# Patient Record
Sex: Male | Born: 1965 | Race: Black or African American | Hispanic: No | Marital: Single
Health system: Southern US, Community
[De-identification: ages and names within clinical notes are randomized; demographics above are authoritative.]

## PROBLEM LIST (undated history)

## (undated) DIAGNOSIS — Z91013 Allergy to seafood: Secondary | ICD-10-CM

## (undated) DIAGNOSIS — J45909 Unspecified asthma, uncomplicated: Secondary | ICD-10-CM

---

## 2003-02-18 ENCOUNTER — Emergency Department (HOSPITAL_COMMUNITY): Admission: EM | Admit: 2003-02-18 | Discharge: 2003-02-18 | Payer: Self-pay | Admitting: Emergency Medicine

## 2003-02-18 ENCOUNTER — Encounter: Payer: Self-pay | Admitting: Emergency Medicine

## 2004-04-12 ENCOUNTER — Emergency Department (HOSPITAL_COMMUNITY): Admission: EM | Admit: 2004-04-12 | Discharge: 2004-04-12 | Payer: Self-pay | Admitting: Emergency Medicine

## 2005-01-23 ENCOUNTER — Emergency Department (HOSPITAL_COMMUNITY): Admission: EM | Admit: 2005-01-23 | Discharge: 2005-01-23 | Payer: Self-pay | Admitting: Emergency Medicine

## 2011-03-07 ENCOUNTER — Emergency Department (HOSPITAL_COMMUNITY)
Admission: EM | Admit: 2011-03-07 | Discharge: 2011-03-07 | Disposition: A | Discharge status: Home / Self Care | Payer: Self-pay | Attending: Emergency Medicine | Admitting: Emergency Medicine

## 2011-03-07 ENCOUNTER — Emergency Department (HOSPITAL_COMMUNITY): Payer: Self-pay

## 2011-03-07 DIAGNOSIS — R05 Cough: Secondary | ICD-10-CM | POA: Insufficient documentation

## 2011-03-07 DIAGNOSIS — IMO0001 Reserved for inherently not codable concepts without codable children: Secondary | ICD-10-CM | POA: Insufficient documentation

## 2011-03-07 DIAGNOSIS — R059 Cough, unspecified: Secondary | ICD-10-CM | POA: Insufficient documentation

## 2011-03-07 DIAGNOSIS — R509 Fever, unspecified: Secondary | ICD-10-CM | POA: Insufficient documentation

## 2011-03-07 DIAGNOSIS — J45909 Unspecified asthma, uncomplicated: Secondary | ICD-10-CM | POA: Insufficient documentation

## 2013-03-27 ENCOUNTER — Encounter (HOSPITAL_COMMUNITY): Payer: Self-pay

## 2013-03-27 ENCOUNTER — Emergency Department (HOSPITAL_COMMUNITY)
Admission: EM | Admit: 2013-03-27 | Discharge: 2013-03-27 | Disposition: A | Discharge status: Home / Self Care | Payer: Self-pay | Attending: Emergency Medicine | Admitting: Emergency Medicine

## 2013-03-27 DIAGNOSIS — Z79899 Other long term (current) drug therapy: Secondary | ICD-10-CM | POA: Insufficient documentation

## 2013-03-27 DIAGNOSIS — F17200 Nicotine dependence, unspecified, uncomplicated: Secondary | ICD-10-CM | POA: Insufficient documentation

## 2013-03-27 DIAGNOSIS — R21 Rash and other nonspecific skin eruption: Secondary | ICD-10-CM | POA: Insufficient documentation

## 2013-03-27 DIAGNOSIS — J45909 Unspecified asthma, uncomplicated: Secondary | ICD-10-CM | POA: Insufficient documentation

## 2013-03-27 HISTORY — DX: Unspecified asthma, uncomplicated: J45.909

## 2013-03-27 MED ORDER — TRIAMCINOLONE ACETONIDE 0.025 % EX OINT: TOPICAL_OINTMENT | Freq: Two times a day (BID) | CUTANEOUS | Status: DC

## 2013-03-27 NOTE — ED Notes (Signed)
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

## 2013-03-27 NOTE — ED Provider Notes (Signed)
CSN: [id number]     Arrival date & time 03/27/13  8469 History   First MD Initiated Contact with Patient 03/27/13 [phone]     Chief Complaint  Patient presents with  . Rash   (Consider location/radiation/quality/duration/timing/severity/associated sxs/prior Treatment) Patient is a 47 y.o. male presenting with rash. The history is provided by the patient. No language interpreter was used.  Rash Location: Neck. 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 Ineffective treatments:  Moisturizers Associated symptoms: no fever, no hoarse voice, no myalgias, no sore throat and no URI     Past Medical History  Diagnosis Date  . Asthma    History reviewed. No pertinent past surgical history. No family history on file. History  Substance Use Topics  . Smoking status: Current Some Day Smoker  . Smokeless tobacco: Not on file  . Alcohol Use: Yes    Review of Systems  Constitutional: Negative for fever.  HENT: Negative for sore throat and hoarse voice.   Musculoskeletal: Negative for myalgias.  Skin: Positive for rash.  All other systems reviewed and are negative.    Allergies  Shrimp  Home Medications   Current Outpatient Rx  Name  Route  Sig  Dispense  Refill  . acetaminophen (TYLENOL) 500 MG tablet   Oral   Take 1,000 mg by mouth every 6 (six) hours as needed for pain.         Marland Kitchen albuterol (PROVENTIL HFA;VENTOLIN HFA) 108 (90 BASE) MCG/ACT inhaler   Inhalation   Inhale 2 puffs into the lungs every 6 (six) hours as needed for wheezing.         Marland Kitchen  DM-Doxylamine-Acetaminophen (NYQUIL COLD & FLU PO)   Oral   Take 30 mLs by mouth at bedtime as needed (cold/flu symptoms).         . triamcinolone (KENALOG) 0.025 % ointment   Topical   Apply topically 2 (two) times daily. Do not apply to face   15 g   1    BP 148/85  Pulse 87  Temp(Src) 98.3 F (36.8 C) (Oral)  Resp 20  SpO2 100%  Physical Exam  Nursing note and vitals reviewed. Constitutional: He is oriented to person, place, and time. He appears well-developed and well-nourished. No distress.  HENT:  Head: Normocephalic and atraumatic.  Eyes: Conjunctivae and EOM are normal. No scleral icterus.  Neck: Normal range of motion. Neck supple.  Cardiovascular: Normal rate, regular rhythm and intact distal pulses.   Distal radial pulses 2+ bilaterally  Pulmonary/Chest: Effort normal. No respiratory distress.  Musculoskeletal: Normal range of motion.  Neurological: He is alert and oriented to person, place, and time.  Skin: Skin is warm and dry. Rash noted. He is not diaphoretic. No erythema. No pallor.  Planar, scaling, non-erythematous rash on anterior and posterior of neck. Skin of affected area leathery to touch and thicker than surrounding skin. No pustules, papules, vesicles, weeping, or drainage. No heat-to-touch.  Psychiatric: He has a normal mood and affect. His behavior is normal.    ED Course  Procedures (including critical care time) Labs Review  Labs Reviewed - No data to display  Imaging Review No results found.  MDM   1. Rash    47 year old male presents for rash to his neck times one week. Patient is well and nontoxic appearing, hemodynamically stable, and afebrile. Physical exam findings as above. Rash not concerning for SJS, erythema multiforme major, or erythema multiforme minor. Patient denies new topical contacts or food ingestion. He denies recent antibiotic use. Patient appropriate for discharge with prescription for triamcinolone for symptoms.  Dermatology referral provided should symptoms not improve. Return precautions discussed and patient agreeable to plan of no unaddressed concerns.    Antonietta Breach, PA-C 03/27/13 0165  Medical screening examination/treatment/procedure(s) were conducted as a shared visit with non-physician practitioner(s) or resident  and myself.  I personally evaluated the patient during the encounter and agree with the findings and plan unless otherwise indicated.    Non specific rash on neck.  Possibly related to plant exposures, pt denies other new exposures.  Dry, pruritic, no erythema or edema, no pustules, no petechia, supple neck. Plan for steroid cream and fup outpt.  Pt agrees with plan, well appearing in ED.  Rash, Dermatitis  Mariea Clonts, MD 03/27/13 1704

## 2013-03-27 NOTE — Progress Notes (Signed)
[facility] provided pt with a list of primary care resources and a [facility] [employer] Card application.  °

## 2015-08-04 ENCOUNTER — Encounter (HOSPITAL_COMMUNITY): Payer: Self-pay

## 2015-08-04 ENCOUNTER — Emergency Department (HOSPITAL_COMMUNITY)
Admission: EM | Admit: 2015-08-04 | Discharge: 2015-08-04 | Disposition: A | Discharge status: Home / Self Care | Payer: BLUE CROSS/BLUE SHIELD | Attending: Emergency Medicine | Admitting: Emergency Medicine

## 2015-08-04 DIAGNOSIS — M5441 Lumbago with sciatica, right side: Secondary | ICD-10-CM | POA: Insufficient documentation

## 2015-08-04 DIAGNOSIS — J45909 Unspecified asthma, uncomplicated: Secondary | ICD-10-CM | POA: Diagnosis not present

## 2015-08-04 DIAGNOSIS — Z79899 Other long term (current) drug therapy: Secondary | ICD-10-CM | POA: Insufficient documentation

## 2015-08-04 DIAGNOSIS — F17210 Nicotine dependence, cigarettes, uncomplicated: Secondary | ICD-10-CM | POA: Insufficient documentation

## 2015-08-04 DIAGNOSIS — M545 Low back pain: Secondary | ICD-10-CM | POA: Diagnosis present

## 2015-08-04 MED ORDER — HYDROCODONE-ACETAMINOPHEN 5-325 MG PO TABS: 1.0000 | ORAL_TABLET | ORAL | Status: DC | PRN

## 2015-08-04 MED ORDER — PREDNISONE 20 MG PO TABS: 20.0000 mg | ORAL_TABLET | Freq: Two times a day (BID) | ORAL | Status: DC

## 2015-08-04 MED ORDER — HYDROCODONE-ACETAMINOPHEN 5-325 MG PO TABS
1.0000 | ORAL_TABLET | ORAL | Status: DC | PRN
Start: 2015-08-04 — End: 2015-08-04

## 2015-08-04 MED ORDER — PREDNISONE 20 MG PO TABS
20.0000 mg | ORAL_TABLET | Freq: Two times a day (BID) | ORAL | Status: DC
Start: 2015-08-04 — End: 2015-08-04

## 2015-08-04 NOTE — ED Notes (Signed)
Patient c/o lower back pain that radiates down the right leg. Patient denies numbness of the right leg, but has intermittent tingling.

## 2015-08-04 NOTE — Discharge Instructions (Signed)
Use heat for sore areas 3 times a day. Try to gently stretch.    Radicular Pain Radicular pain in either the arm or leg is usually from a bulging or herniated disk in the spine. A piece of the herniated disk may press against the nerves as the nerves exit the spine. This causes pain which is felt at the tips of the nerves down the arm or leg. Other causes of radicular pain may include: 1. Fractures. 2. Heart disease. 3. Cancer. 4. An abnormal and usually degenerative state of the nervous system or nerves (neuropathy). Diagnosis may require CT or MRI scanning to determine the primary cause.  Nerves that start at the neck (nerve roots) may cause radicular pain in the outer shoulder and arm. It can spread down to the thumb and fingers. The symptoms vary depending on which nerve root has been affected. In most cases radicular pain improves with conservative treatment. Neck problems may require physical therapy, a neck collar, or cervical traction. Treatment may take many weeks, and surgery may be considered if the symptoms do not improve.  Conservative treatment is also recommended for sciatica. Sciatica causes pain to radiate from the lower back or buttock area down the leg into the foot. Often there is a history of back problems. Most patients with sciatica are better after 2 to 4 weeks of rest and other supportive care. Short term bed rest can reduce the disk pressure considerably. Sitting, however, is not a good position since this increases the pressure on the disk. You should avoid bending, lifting, and all other activities which make the problem worse. Traction can be used in severe cases. Surgery is usually reserved for patients who do not improve within the first months of treatment. Only take over-the-counter or prescription medicines for pain, discomfort, or fever as directed by your caregiver. Narcotics and muscle relaxants may help by relieving more severe pain and spasm and by providing mild  sedation. Cold or massage can give significant relief. Spinal manipulation is not recommended. It can increase the degree of disc protrusion. Epidural steroid injections are often effective treatment for radicular pain. These injections deliver medicine to the spinal nerve in the space between the protective covering of the spinal cord and back bones (vertebrae). Your caregiver can give you more information about steroid injections. These injections are most effective when given within two weeks of the onset of pain.  You should see your caregiver for follow up care as recommended. A program for neck and back injury rehabilitation with stretching and strengthening exercises is an important part of management.  SEEK IMMEDIATE MEDICAL CARE IF: 1. You develop increased pain, weakness, or numbness in your arm or leg. 2. You develop difficulty with bladder or bowel control. 3. You develop abdominal pain.   This information is not intended to replace advice given to you by your health care provider. Make sure you discuss any questions you have with your health care provider.   Document Released: 07/28/2004 Document Revised: 07/11/2014 Document Reviewed: 01/14/2015 Elsevier Interactive Patient Education 2016 Elsevier Inc.  Back Exercises If you have pain in your back, do these exercises 2-3 times each day or as told by your doctor. When the pain goes away, do the exercises once each day, but repeat the steps more times for each exercise (do more repetitions). If you do not have pain in your back, do these exercises once each day or as told by your doctor. EXERCISES Single Knee to Chest Do these  steps 3-5 times in a row for each leg: 5. Lie on your back on a firm bed or the floor with your legs stretched out. 6. Bring one knee to your chest. 7. Hold your knee to your chest by grabbing your knee or thigh. 8. Pull on your knee until you feel a gentle stretch in your lower back. 9. Keep doing the stretch  for 10-30 seconds. 10. Slowly let go of your leg and straighten it. Pelvic Tilt Do these steps 5-10 times in a row: 4. Lie on your back on a firm bed or the floor with your legs stretched out. 5. Bend your knees so they point up to the ceiling. Your feet should be flat on the floor. 6. Tighten your lower belly (abdomen) muscles to press your lower back against the floor. This will make your tailbone point up to the ceiling instead of pointing down to your feet or the floor. 7. Stay in this position for 5-10 seconds while you gently tighten your muscles and breathe evenly. Cat-Cow Do these steps until your lower back bends more easily: 1. Get on your hands and knees on a firm surface. Keep your hands under your shoulders, and keep your knees under your hips. You may put padding under your knees. 2. Let your head hang down, and make your tailbone point down to the floor so your lower back is round like the back of a cat. 3. Stay in this position for 5 seconds. 4. Slowly lift your head and make your tailbone point up to the ceiling so your back hangs low (sags) like the back of a cow. 5. Stay in this position for 5 seconds. Press-Ups Do these steps 5-10 times in a row: 1. Lie on your belly (face-down) on the floor. 2. Place your hands near your head, about shoulder-width apart. 3. While you keep your back relaxed and keep your hips on the floor, slowly straighten your arms to raise the top half of your body and lift your shoulders. Do not use your back muscles. To make yourself more comfortable, you may change where you place your hands. 4. Stay in this position for 5 seconds. 5. Slowly return to lying flat on the floor. Bridges Do these steps 10 times in a row: 1. Lie on your back on a firm surface. 2. Bend your knees so they point up to the ceiling. Your feet should be flat on the floor. 3. Tighten your butt muscles and lift your butt off of the floor until your waist is almost as high as  your knees. If you do not feel the muscles working in your butt and the back of your thighs, slide your feet 1-2 inches farther away from your butt. 4. Stay in this position for 3-5 seconds. 5. Slowly lower your butt to the floor, and let your butt muscles relax. If this exercise is too easy, try doing it with your arms crossed over your chest. Belly Crunches Do these steps 5-10 times in a row: 1. Lie on your back on a firm bed or the floor with your legs stretched out. 2. Bend your knees so they point up to the ceiling. Your feet should be flat on the floor. 3. Cross your arms over your chest. 4. Tip your chin a little bit toward your chest but do not bend your neck. 5. Tighten your belly muscles and slowly raise your chest just enough to lift your shoulder blades a tiny bit off of  the floor. 6. Slowly lower your chest and your head to the floor. Back Lifts Do these steps 5-10 times in a row: 1. Lie on your belly (face-down) with your arms at your sides, and rest your forehead on the floor. 2. Tighten the muscles in your legs and your butt. 3. Slowly lift your chest off of the floor while you keep your hips on the floor. Keep the back of your head in line with the curve in your back. Look at the floor while you do this. 4. Stay in this position for 3-5 seconds. 5. Slowly lower your chest and your face to the floor. GET HELP IF:  Your back pain gets a lot worse when you do an exercise.  Your back pain does not lessen 2 hours after you exercise. If you have any of these problems, stop doing the exercises. Do not do them again unless your doctor says it is okay. GET HELP RIGHT AWAY IF:  You have sudden, very bad back pain. If this happens, stop doing the exercises. Do not do them again unless your doctor says it is okay.   This information is not intended to replace advice given to you by your health care provider. Make sure you discuss any questions you have with your health care  provider.   Document Released: 07/23/2010 Document Revised: 03/11/2015 Document Reviewed: 08/14/2014 Elsevier Interactive Patient Education 2016 [employer].   Emergency Department Resource Guide 1) Find a Doctor and Pay Out of Pocket Although you won't have to find out who is covered by your insurance plan, it is a good idea to ask around and get recommendations. You will then need to call the office and see if the doctor you have chosen will accept you as a new patient and what types of options they offer for patients who are self-pay. Some doctors offer discounts or will set up payment plans for their patients who do not have insurance, but you will need to ask so you aren't surprised when you get to your appointment.  2) Contact Your Local Health Department Not all health departments have doctors that can see patients for sick visits, but many do, so it is worth a call to see if yours does. If you don't know where your local health department is, you can check in your phone book. The CDC also has a tool to help you locate your state's health department, and many state websites also have listings of all of their local health departments.  3) Find a Walk-in Clinic If your illness is not likely to be very severe or complicated, you may want to try a walk in clinic. These are popping up all over the country in pharmacies, drugstores, and shopping centers. They're usually staffed by nurse practitioners or physician assistants that have been trained to treat common illnesses and complaints. They're usually fairly quick and inexpensive. However, if you have serious medical issues or chronic medical problems, these are probably not your best option.  No Primary Care Doctor: - Call Health Connect at  [phone] - they can help you locate a primary care doctor that  accepts your insurance, provides certain services, etc. - Physician Referral Service- [phone]  Chronic Pain  Problems: Organization         Address  Phone   Notes  [facility]  [phone] Patients need to be referred by their primary care doctor.   Medication Assistance: Organization  Address  Phone   Notes  [facility] Medication [facility] [address] [place] [place], [place] [address] [phone] --Must be a resident of [facility] -- Must have NO insurance coverage whatsoever (no Medicaid/ Medicare, etc.) -- The pt. MUST have a primary care doctor that directs their care regularly and follows them in the community   [facility]  [phone]   [employer]  [phone]    Agencies that provide inexpensive medical care: Organization         Address  Phone   Notes  [facility]  [phone]   [facility]    [phone]   [facility] [address] [place], [place] [address] [phone]   [facility] [address] [place] [place] [phone]   Planned Parenthood    [phone]   [facility]    [phone]   [facility]  [address], [place] Phone:  [phone], Fax:  [phone] Hours of Operation:  9 am - 6 pm, M-F.  Also accepts Medicaid/Medicare and self-pay.  [facility]  [address], [place] Phone: [phone], Fax: [phone]. Hours of Operation:  8:30 am - 5:30 pm, M-F.  Also accepts Medicaid and self-pay.  [facility] [place] [address] [place] Phone: [phone]   [facility] [address] Natasha Bence [place], [place] [phone], Ext. [phone] Mondays & Thursdays: 7-9 AM.  First 15 patients are seen on a first come, first serve basis.    Medicaid-accepting [facility] Providers:  Organization         Address  Phone   Notes  [facility] [address], [place] [phone] Also  accepts self-pay patients.  [facility] [address] Laurell Josephs [place], [place]  [phone]   [employer] [address], [place] [phone]   [facility] [address], [place] [phone]   Renaye Rakers [address], [place]   [phone] Only accepts [place] Access [place] patients after they have their name applied to their card.   Self-Pay (no insurance) in [facility]:  Organization         Address  Phone   Notes  Sickle Cell Patients, [facility] [address] [place], [place] [phone]   [facility] [address] [place], [place] [phone]   [facility] [place]  [address], [place] [phone]   [facility]/Dr. Osei-Bonsu  [address], [place] or [address], [place] [phone] Phone number for both [place] and [place] locations is the same.  [facility] [address], [place] [phone]   [facility] [place] [address], [place] or [address] [phone]   [facility] [address], [place] [phone], phone; [phone], fax Sees patients 1st and 3rd Saturday of every month.  Must not qualify for public or private insurance (i.e. Medicaid, Medicare, [place] Health Choice, Veterans' Benefits)  Household income should be no more than 200% of the poverty level The clinic cannot treat you if you are pregnant or think you are  pregnant  Sexually transmitted diseases are not treated at the clinic.    Dental Care: Organization         Address  Phone  Notes  [facility] Department of [facility] [address] [place], [place] [phone] Accepts children up to age 61 who are enrolled in [place] or [place] Health Choice; pregnant  women with a Medicaid card; and children who have applied for Medicaid or [place] Health Choice, but were declined, whose parents can pay a reduced fee at time of service.  [facility] Department of [facility]  [address], [place] [phone] Accepts children up to age 34 who are enrolled in [place] or [place] Health Choice; pregnant women with a Medicaid card; and children who have applied for Medicaid or [place] Health Choice, but were declined, whose parents can pay a reduced fee at time of service.  [facility]  [address] [place], [place] [phone] Patients are seen by appointment only. Walk-ins are not accepted. [facility] will see patients 63 years of age and older. Monday - Tuesday (8am-5pm) Most Wednesdays (8:30-5pm) $30 per visit, cash only  [facility]  [address], [facility] [phone] Patients are seen by appointment only. Walk-ins are not accepted. [facility] will see patients 39 years of age and older. One Wednesday Evening (Monthly: Volunteer Based).  $30 per visit, cash only  [employer]  [phone] for adults; Children under age 18, call Graduate Pediatric Dentistry at [phone]. Children aged 36-14, please call [phone] to request a pediatric application.  Dental services are provided in all areas of dental care including fillings, crowns and bridges, complete and partial dentures, implants, gum treatment, root canals, and extractions. Preventive care is also provided. Treatment is provided to both adults and children. Patients are selected via a lottery and there is often a waiting list.   [facility] [address] [place]  [phone] [email or web address]   [facility] [address] [place], [place] [phone], Ext. [phone] Second and Fourth Thursday of each month, opens at 6:30 AM; Clinic ends at 9 AM.  Patients are  seen on a first-come first-served basis, and a limited number are seen during each clinic.   [facility]  [address] Ether Griffins [place], [place] [phone]   Eligibility Requirements You must have lived in [place], [place], or [place] counties for at least the last three months.   You cannot be eligible for state or federal sponsored [place], including [employer], [place], or [employer].   You generally cannot be eligible for healthcare insurance through your employer.    How to apply: Eligibility screenings are held every Tuesday and Wednesday afternoon from 1:00 pm until 4:00 pm. You do not need an appointment for the interview!  [facility] [address], [place], [place] [phone]   [facility] Health [facility]  [phone]   [facility] Health Department  [phone]   [facility] Department  [phone]    Behavioral Health Resources in the Community: Intensive Outpatient Programs Organization         Address  Phone  Notes  [facility] [address] [place], [place] [phone]   [facility] Outpatient [address], [place], [place] [phone]   [facility] [address],  [place], [place]  [phone]   [facility] [address]  [place], [place] [phone] or [phone]   Substance Abuse Resources Organization         Address  Phone  Notes  [facility]  [phone]   [facility]  [phone]   [facility] [place]  [phone]   Floydene Flock  [phone]   Residential & Outpatient Substance Abuse Program  [phone]   Psychological Services Organization         Address  Phone  Notes  [facility] Behavioral Health  [phone]) [phone]   [facility] Services  [phone]   [facility] Mental Health [address] [place] [phone] or [phone]    Mobile Crisis  Teams Organization         Address  Phone  Notes  [facility], Mobile Crisis Care Unit  [phone]   [facility]  [address] [place], [place] [phone]   Doristine Locks [address] [place] [phone]    Self-Help/Support Groups Organization         Address  Phone             Notes  [facility] [place] - variety of support groups  336- I7437963 Call for more information  Narcotics Anonymous (NA), [facility] [address], [employer] [place]  2 meetings at this location   Statistician         Address  Phone  Notes  [facility] [address],    [place]  [phone]   [facility]  [address] [place] [address], [place], [place] [phone]   [facility] Treatment Facility [address] [place], [place] [phone] Admissions: 8am-3pm M-F  [facility] [address],    [place], [place] [phone]   [facility] [address] [place], [place], [place] [phone]   [facility] [address]  [place], [place] [phone]   [facility] - Intensive Outpatient [address] Laurell Josephs [address], [place], [place] [phone]   [facility]) (Addiction Recovery [facility] [address] [place].,  [place], [place] [phone] or [phone]   [facility] ([facility]) [address] [place], [place] [phone] Accepts Medicaid  [facility] [place] [address]  [place] [phone] Substance Abuse/Addiction Treatment   [facility] Organization         Address  Phone  Notes  [facility]  [phone]   Angie Fava, PhD [address] Ervin Knack [place], [place]   [phone] or [phone]   [facility]   [address] [place], [place] [phone]   [facility] [address], [place], [place] [phone]  Insurance/Medicaid/sponsorship through [facility] Families [address]                                    [place], [place] [phone] Therapy/tele-psych/case  [facility] [address], [place] [phone]    Dr. Lolly Mustache  [phone]   [facility]  [employer] Dept. 1) [address], [place] 2) [address], [place] 3)  [address], [place] [phone]  [phone]   [place] [facility] Child Abuse Hotline (707)683-1022)  342-1394 or (336) [phone] (After Hours)    ° ° ° °

## 2015-08-04 NOTE — ED Provider Notes (Signed)
CSN: [id number]     Arrival date & time 08/04/15  4098 History   First MD Initiated Contact with Patient 08/04/15 [phone]     Chief Complaint  Patient presents with  . Back Pain     (Consider location/radiation/quality/duration/timing/severity/associated sxs/prior Treatment) HPI  Jesse Key is a 50 y.o. male who presents for evaluation of right low back pain radiating to the right buttock, which started 3 days ago when he was "pushing a heavy bed". Pain has gradually worsened. He was able to work yesterday but feels too uncomfortable to work today. No change in bowel or bladder function. No paresthesia or weakness. No prior similar problem. There are no other no modifying factors.   Past Medical History  Diagnosis Date  . Asthma    History reviewed. No pertinent past surgical history. Family History  Problem Relation Age of Onset  . Diabetes Mother   . Hypertension Mother   . Diabetes Father   . Hypertension Father    Social History  Substance Use Topics  . Smoking status: Current Some Day Smoker    Types: Cigarettes  . Smokeless tobacco: Never Used  . Alcohol Use: Yes     Comment: weekends only    Review of Systems  All other systems reviewed and are negative.     Allergies  Shrimp  Home Medications   Prior to Admission medications   Medication Sig Start Date End Date Taking? Authorizing Provider  acetaminophen (TYLENOL) 500 MG tablet Take 1,000 mg by mouth every 6 (six) hours as needed for pain.    Historical Provider, MD  albuterol (PROVENTIL HFA;VENTOLIN HFA) 108 (90 BASE) MCG/ACT inhaler Inhale 2 puffs into the lungs every 6 (six) hours as needed for wheezing.    Historical Provider, MD  DM-Doxylamine-Acetaminophen (NYQUIL COLD & FLU PO) Take 30 mLs by mouth at bedtime as needed (cold/flu symptoms).    Historical Provider, MD  HYDROcodone-acetaminophen (NORCO) 5-325 MG tablet Take 1 tablet by mouth every 4 (four) hours as needed. 08/04/15   Mancel Bale, MD   predniSONE (DELTASONE) 20 MG tablet Take 1 tablet (20 mg total) by mouth 2 (two) times daily. 08/04/15   Mancel Bale, MD  triamcinolone (KENALOG) 0.025 % ointment Apply topically 2 (two) times daily. Do not apply to face 03/27/13   Antony Madura, PA-C   BP 156/86 mmHg  Pulse 91  Temp(Src) 98 F (36.7 C) (Oral)  Resp 17  SpO2 100% Physical Exam  Constitutional: He is oriented to person, place, and time. He appears well-developed and well-nourished.  HENT:  Head: Normocephalic and atraumatic.  Right Ear: External ear normal.  Left Ear: External ear normal.  Eyes: Conjunctivae and EOM are normal. Pupils are equal, round, and reactive to light.  Neck: Normal range of motion and phonation normal. Neck supple.  Cardiovascular: Normal rate, regular rhythm and normal heart sounds.   Pulmonary/Chest: Effort normal and breath sounds normal. He exhibits no bony tenderness.  Abdominal: Soft. There is no tenderness.  Musculoskeletal:  He is able to walk slowly and is limited somewhat with right low back pain, which is worse with ambulation.  Neurological: He is alert and oriented to person, place, and time. No cranial nerve deficit or sensory deficit. He exhibits normal muscle tone. Coordination normal.  Normal strength, legs bilaterally.  Skin: Skin is warm, dry and intact.  Psychiatric: He has a normal mood and affect. His behavior is normal. Judgment and thought content normal.  Nursing note and vitals reviewed.  ED Course  Procedures (including critical care time)  Findings discussed with patient, all questions answered  Labs Review Labs Reviewed - No data to display  Imaging Review No results found. I have personally reviewed and evaluated these images and lab results as part of my medical decision-making.   EKG Interpretation None      MDM   Final diagnoses:  Right-sided low back pain with right-sided sciatica    Low back pain radiating to right buttock, consistent with  sciatica, unlikely to represent fracture. HNP or cauda equina syndrome.  Nursing Notes Reviewed/ Care Coordinated Applicable Imaging Reviewed Interpretation of Laboratory Data incorporated into ED treatment  The patient appears reasonably screened and/or stabilized for discharge and I doubt any other medical condition or other [facility] requiring further screening, evaluation, or treatment in the ED at this time prior to discharge.  Plan: Home Medications- Prednisone, Norco; Home Treatments- rest, heat; return here if the recommended treatment, does not improve the symptoms; Recommended follow up- PCP prn     Mancel Bale, MD 08/04/15 1010

## 2016-08-18 ENCOUNTER — Emergency Department (HOSPITAL_COMMUNITY)
Admission: EM | Admit: 2016-08-18 | Discharge: 2016-08-18 | Disposition: A | Discharge status: Home / Self Care | Payer: BLUE CROSS/BLUE SHIELD | Attending: Emergency Medicine | Admitting: Emergency Medicine

## 2016-08-18 ENCOUNTER — Encounter (HOSPITAL_COMMUNITY): Payer: Self-pay | Admitting: Emergency Medicine

## 2016-08-18 DIAGNOSIS — J111 Influenza due to unidentified influenza virus with other respiratory manifestations: Secondary | ICD-10-CM | POA: Insufficient documentation

## 2016-08-18 DIAGNOSIS — F17210 Nicotine dependence, cigarettes, uncomplicated: Secondary | ICD-10-CM | POA: Insufficient documentation

## 2016-08-18 DIAGNOSIS — R69 Illness, unspecified: Secondary | ICD-10-CM

## 2016-08-18 DIAGNOSIS — J45909 Unspecified asthma, uncomplicated: Secondary | ICD-10-CM | POA: Insufficient documentation

## 2016-08-18 MED ORDER — PSEUDOEPHEDRINE HCL 30 MG PO TABS
30.0000 mg | ORAL_TABLET | ORAL | 0 refills | Status: DC | PRN
Start: 2016-08-18 — End: 2018-01-12

## 2016-08-18 MED ORDER — ALBUTEROL SULFATE HFA 108 (90 BASE) MCG/ACT IN AERS
1.0000 | INHALATION_SPRAY | RESPIRATORY_TRACT | Status: DC | PRN
  Administered 2016-08-18: 2 via RESPIRATORY_TRACT
  Filled 2016-08-18: qty 6.7

## 2016-08-18 MED ORDER — IBUPROFEN 200 MG PO TABS
600.0000 mg | ORAL_TABLET | Freq: Once | ORAL | Status: AC
  Administered 2016-08-18: 600 mg via ORAL
  Filled 2016-08-18: qty 3

## 2016-08-18 MED ORDER — AEROCHAMBER PLUS FLO-VU MEDIUM MISC
1.0000 | Freq: Once | Status: AC
  Administered 2016-08-18: 1
  Filled 2016-08-18: qty 1

## 2016-08-18 MED ORDER — OXYMETAZOLINE HCL 0.05 % NA SOLN
1.0000 | Freq: Once | NASAL | Status: AC
  Administered 2016-08-18: 1 via NASAL
  Filled 2016-08-18: qty 15

## 2016-08-18 MED ORDER — IBUPROFEN 600 MG PO TABS: 600.0000 mg | ORAL_TABLET | Freq: Four times a day (QID) | ORAL | 0 refills | Status: DC | PRN

## 2016-08-18 NOTE — ED Triage Notes (Signed)
Pt reports productive cough, HA, and body aches since Monday.

## 2016-08-18 NOTE — ED Provider Notes (Signed)
[facility]-EMERGENCY DEPT Provider Note   CSN: [id number] Arrival date & time: 08/18/16  0825     History   Chief Complaint Chief Complaint  Patient presents with  . Cough    HPI Jesse Key is a 51 y.o. male.  Pt presents to the ED with cough, h/a, fevers, chills, cough, and body aches since 2/12.  The pt said he has had fevers at home.  The pt said that he has not been around any one that has been sick.       Past Medical History:  Diagnosis Date  . Asthma     There are no active problems to display for this patient.   History reviewed. No pertinent surgical history.     Home Medications    Prior to Admission medications   Medication Sig Start Date End Date Taking? Authorizing Provider  acetaminophen (TYLENOL) 500 MG tablet Take 1,000 mg by mouth every 6 (six) hours as needed for pain.    Historical Provider, MD  albuterol (PROVENTIL HFA;VENTOLIN HFA) 108 (90 BASE) MCG/ACT inhaler Inhale 2 puffs into the lungs every 6 (six) hours as needed for wheezing.    Historical Provider, MD  DM-Doxylamine-Acetaminophen (NYQUIL COLD & FLU PO) Take 30 mLs by mouth at bedtime as needed (cold/flu symptoms).    Historical Provider, MD  HYDROcodone-acetaminophen (NORCO) 5-325 MG tablet Take 1 tablet by mouth every 4 (four) hours as needed. 08/04/15   Mancel Bale, MD  ibuprofen (ADVIL,MOTRIN) 600 MG tablet Take 1 tablet (600 mg total) by mouth every 6 (six) hours as needed. 08/18/16   Jacalyn Lefevre, MD  predniSONE (DELTASONE) 20 MG tablet Take 1 tablet (20 mg total) by mouth 2 (two) times daily. 08/04/15   Mancel Bale, MD  pseudoephedrine (SUDAFED) 30 MG tablet Take 1 tablet (30 mg total) by mouth every 4 (four) hours as needed for congestion. 08/18/16   Jacalyn Lefevre, MD  triamcinolone (KENALOG) 0.025 % ointment Apply topically 2 (two) times daily. Do not apply to face 03/27/13   Antony Madura, PA-C    Family History Family History  Problem Relation Age of Onset  . Diabetes  Mother   . Hypertension Mother   . Diabetes Father   . Hypertension Father     Social History Social History  Substance Use Topics  . Smoking status: Current Some Day Smoker    Types: Cigarettes  . Smokeless tobacco: Never Used  . Alcohol use Yes     Comment: weekends only     Allergies   Shrimp [shellfish allergy]   Review of Systems Review of Systems  Constitutional: Positive for chills, fatigue and fever.  Respiratory: Positive for cough, shortness of breath and wheezing.   All other systems reviewed and are negative.    Physical Exam Updated Vital Signs BP 149/81 (BP Location: Right Arm)   Pulse 95   Temp 98.8 F (37.1 C) (Oral)   Resp 18   Ht 5\' 5"  (1.651 m)   Wt 150 lb (68 kg)   SpO2 98%   BMI 24.96 kg/m   Physical Exam  Constitutional: He is oriented to person, place, and time. He appears well-developed and well-nourished.  HENT:  Head: Normocephalic and atraumatic.  Right Ear: External ear normal.  Left Ear: External ear normal.  Nose: Nose normal.  Mouth/Throat: Oropharynx is clear and moist.  Eyes: Conjunctivae and EOM are normal. Pupils are equal, round, and reactive to light.  Neck: Normal range of motion. Neck supple.  Cardiovascular:  Normal rate, regular rhythm, normal heart sounds and intact distal pulses.   Pulmonary/Chest: Effort normal. He has wheezes.  Abdominal: Soft. Bowel sounds are normal.  Musculoskeletal: Normal range of motion.  Neurological: He is alert and oriented to person, place, and time.  Skin: Skin is warm. Capillary refill takes less than 2 seconds.  Psychiatric: He has a normal mood and affect. His behavior is normal. Judgment and thought content normal.  Nursing note and vitals reviewed.    ED Treatments / Results  Labs (all labs ordered are listed, but only abnormal results are displayed) Labs Reviewed - No data to display  EKG  EKG Interpretation None       Radiology No results  found.  Procedures Procedures (including critical care time)  Medications Ordered in ED Medications  ibuprofen (ADVIL,MOTRIN) tablet 600 mg (not administered)  oxymetazoline (AFRIN) 0.05 % nasal spray 1 spray (not administered)  albuterol (PROVENTIL HFA;VENTOLIN HFA) 108 (90 Base) MCG/ACT inhaler 1-2 puff (not administered)  AEROCHAMBER PLUS FLO-VU MEDIUM MISC 1 each (not administered)     Initial Impression / Assessment and Plan / ED Course  I have reviewed the triage vital signs and the nursing notes.  Pertinent labs & imaging results that were available during my care of the patient were reviewed by me and considered in my medical decision making (see chart for details).    Pt's sx sound like the flu.  Sx have been going on for 3 days.  He will be treated symptomatically and given a note for work.  He knows to return if worse.  Final Clinical Impressions(s) / ED Diagnoses   Final diagnoses:  Influenza-like illness    New Prescriptions New Prescriptions   IBUPROFEN (ADVIL,MOTRIN) 600 MG TABLET    Take 1 tablet (600 mg total) by mouth every 6 (six) hours as needed.   PSEUDOEPHEDRINE (SUDAFED) 30 MG TABLET    Take 1 tablet (30 mg total) by mouth every 4 (four) hours as needed for congestion.     Jacalyn LefevreJulie Elizabeth Haff, MD 08/18/16 [phone]

## 2017-01-10 ENCOUNTER — Ambulatory Visit: Payer: Self-pay | Admitting: Family

## 2017-01-20 ENCOUNTER — Ambulatory Visit: Payer: Self-pay | Admitting: Family

## 2017-01-26 ENCOUNTER — Ambulatory Visit: Payer: Self-pay | Admitting: Family

## 2017-01-31 ENCOUNTER — Ambulatory Visit: Payer: Self-pay | Admitting: Family

## 2017-04-09 ENCOUNTER — Emergency Department (HOSPITAL_COMMUNITY)
Admission: EM | Admit: 2017-04-09 | Discharge: 2017-04-09 | Disposition: A | Discharge status: Home / Self Care | Payer: Self-pay | Attending: Emergency Medicine | Admitting: Emergency Medicine

## 2017-04-09 ENCOUNTER — Emergency Department (HOSPITAL_COMMUNITY): Payer: Self-pay

## 2017-04-09 ENCOUNTER — Encounter (HOSPITAL_COMMUNITY): Payer: Self-pay | Admitting: Emergency Medicine

## 2017-04-09 DIAGNOSIS — R1110 Vomiting, unspecified: Secondary | ICD-10-CM | POA: Insufficient documentation

## 2017-04-09 DIAGNOSIS — F17210 Nicotine dependence, cigarettes, uncomplicated: Secondary | ICD-10-CM | POA: Insufficient documentation

## 2017-04-09 DIAGNOSIS — J4521 Mild intermittent asthma with (acute) exacerbation: Secondary | ICD-10-CM | POA: Insufficient documentation

## 2017-04-09 DIAGNOSIS — Z79899 Other long term (current) drug therapy: Secondary | ICD-10-CM | POA: Insufficient documentation

## 2017-04-09 DIAGNOSIS — B349 Viral infection, unspecified: Secondary | ICD-10-CM | POA: Insufficient documentation

## 2017-04-09 LAB — COMPREHENSIVE METABOLIC PANEL
ALT: 31 U/L (ref 17–63)
AST: 35 U/L (ref 15–41)
Albumin: 4.3 g/dL (ref 3.5–5.0)
Alkaline Phosphatase: 68 U/L (ref 38–126)
Anion gap: 10 (ref 5–15)
BUN: 12 mg/dL (ref 6–20)
CO2: 23 mmol/L (ref 22–32)
Calcium: 9.4 mg/dL (ref 8.9–10.3)
Chloride: 108 mmol/L (ref 101–111)
Creatinine, Ser: 1.2 mg/dL (ref 0.61–1.24)
GFR calc Af Amer: >60 mL/min (ref >60)
GFR calc non Af Amer: >60 mL/min (ref >60)
Glucose, Bld: 101 mg/dL — ABNORMAL HIGH (ref 65–99)
Potassium: 4 mmol/L (ref 3.5–5.1)
Sodium: 141 mmol/L (ref 135–145)
Total Bilirubin: 0.5 mg/dL (ref 0.3–1.2)
Total Protein: 7.9 g/dL (ref 6.5–8.1)

## 2017-04-09 LAB — URINALYSIS, ROUTINE W REFLEX MICROSCOPIC
Bilirubin Urine: NEGATIVE
Glucose, UA: NEGATIVE mg/dL
Hgb urine dipstick: NEGATIVE
Ketones, ur: NEGATIVE mg/dL
Leukocytes, UA: NEGATIVE
Nitrite: NEGATIVE
Protein, ur: NEGATIVE mg/dL
Specific Gravity, Urine: 1.023 (ref 1.005–1.030)
pH: 5 (ref 5.0–8.0)

## 2017-04-09 LAB — CBC
HCT: 38.1 % — ABNORMAL LOW (ref 39.0–52.0)
Hemoglobin: 12.3 g/dL — ABNORMAL LOW (ref 13.0–17.0)
MCH: 23.3 pg — ABNORMAL LOW (ref 26.0–34.0)
MCHC: 32.3 g/dL (ref 30.0–36.0)
MCV: 72.2 fL — ABNORMAL LOW (ref 78.0–100.0)
Platelets: 236 10*3/uL (ref 150–400)
RBC: 5.28 MIL/uL (ref 4.22–5.81)
RDW: 15.3 % (ref 11.5–15.5)
WBC: 10.8 10*3/uL — ABNORMAL HIGH (ref 4.0–10.5)

## 2017-04-09 LAB — LIPASE, BLOOD: Lipase: 54 U/L — ABNORMAL HIGH (ref 11–51)

## 2017-04-09 MED ORDER — SODIUM CHLORIDE 0.9 % IV BOLUS (SEPSIS)
1000.0000 mL | Freq: Once | INTRAVENOUS | Status: AC
  Administered 2017-04-09: 1000 mL via INTRAVENOUS

## 2017-04-09 MED ORDER — PREDNISONE 20 MG PO TABS: 60.0000 mg | ORAL_TABLET | Freq: Every day | ORAL | 0 refills | Status: AC

## 2017-04-09 MED ORDER — ALBUTEROL SULFATE HFA 108 (90 BASE) MCG/ACT IN AERS
1.0000 | INHALATION_SPRAY | Freq: Once | RESPIRATORY_TRACT | Status: AC
  Administered 2017-04-09: 1 via RESPIRATORY_TRACT
  Filled 2017-04-09: qty 6.7

## 2017-04-09 MED ORDER — PREDNISONE 20 MG PO TABS
60.0000 mg | ORAL_TABLET | Freq: Once | ORAL | Status: AC
  Administered 2017-04-09: 60 mg via ORAL
  Filled 2017-04-09: qty 3

## 2017-04-09 MED ORDER — IPRATROPIUM-ALBUTEROL 0.5-2.5 (3) MG/3ML IN SOLN
5.0000 mL | Freq: Once | RESPIRATORY_TRACT | Status: AC
  Administered 2017-04-09: 5 mL via RESPIRATORY_TRACT
  Filled 2017-04-09: qty 3

## 2017-04-09 MED ORDER — ALBUTEROL SULFATE HFA 108 (90 BASE) MCG/ACT IN AERS: 1.0000 | INHALATION_SPRAY | Freq: Four times a day (QID) | RESPIRATORY_TRACT | 0 refills | Status: DC | PRN

## 2017-04-09 NOTE — ED Provider Notes (Signed)
[facility]-EMERGENCY DEPT Provider Note   CSN: [id number] Arrival date & time: 04/09/17  1326     History   Chief Complaint Chief Complaint  Patient presents with  . Cough  . Emesis    HPI Jesse Key is a 51 y.o. male.  HPI  51 y.o. male with a hx of Asthma, presents to the Emergency Department today due to cough since Friday. Notes URI symptoms of congestion, rhinorrhea, sinus pressure. Notes productive cough. No fevers. No CP. Does not SOB with asthma being triggered. Noted N/V/D. Since resolved today. No abdominal pain. No sick contacts. No headaches. No neck stiffness. No other symptoms noted.    Past Medical History:  Diagnosis Date  . Asthma     There are no active problems to display for this patient.   History reviewed. No pertinent surgical history.     Home Medications    Prior to Admission medications   Medication Sig Start Date End Date Taking? Authorizing Provider  acetaminophen (TYLENOL) 500 MG tablet Take 1,000 mg by mouth every 6 (six) hours as needed for pain.    [provider]  albuterol (PROVENTIL HFA;VENTOLIN HFA) 108 (90 BASE) MCG/ACT inhaler Inhale 2 puffs into the lungs every 6 (six) hours as needed for wheezing.    [provider]  DM-Doxylamine-Acetaminophen (NYQUIL COLD & FLU PO) Take 30 mLs by mouth at bedtime as needed (cold/flu symptoms).    [provider]  HYDROcodone-acetaminophen (NORCO) 5-325 MG tablet Take 1 tablet by mouth every 4 (four) hours as needed. 08/04/15   Mancel Bale, MD  ibuprofen (ADVIL,MOTRIN) 600 MG tablet Take 1 tablet (600 mg total) by mouth every 6 (six) hours as needed. 08/18/16   Jacalyn Lefevre, MD  predniSONE (DELTASONE) 20 MG tablet Take 1 tablet (20 mg total) by mouth 2 (two) times daily. 08/04/15   Mancel Bale, MD  pseudoephedrine (SUDAFED) 30 MG tablet Take 1 tablet (30 mg total) by mouth every 4 (four) hours as needed for congestion. 08/18/16   Jacalyn Lefevre, MD  triamcinolone  (KENALOG) 0.025 % ointment Apply topically 2 (two) times daily. Do not apply to face 03/27/13   Antony Madura, PA-C    Family History Family History  Problem Relation Age of Onset  . Diabetes Mother   . Hypertension Mother   . Diabetes Father   . Hypertension Father     Social History Social History  Substance Use Topics  . Smoking status: Current Some Day Smoker    Types: Cigarettes  . Smokeless tobacco: Never Used  . Alcohol use Yes     Comment: weekends only     Allergies   Shrimp [shellfish allergy]   Review of Systems Review of Systems ROS reviewed and all are negative for acute change except as noted in the HPI.  Physical Exam Updated Vital Signs BP (!) 150/81   Pulse 89   Temp 98 F (36.7 C) (Oral)   Resp 16   SpO2 99%   Physical Exam  Constitutional: He is oriented to person, place, and time. He appears well-developed and well-nourished. No distress.  HENT:  Head: Normocephalic and atraumatic.  Right Ear: Tympanic membrane, external ear and ear canal normal.  Left Ear: Tympanic membrane, external ear and ear canal normal.  Nose: Nose normal.  Mouth/Throat: Uvula is midline, oropharynx is clear and moist and mucous membranes are normal. No trismus in the jaw. No oropharyngeal exudate, posterior oropharyngeal erythema or tonsillar abscesses.  Eyes: Pupils are equal, round,  and reactive to light. EOM are normal.  Neck: Normal range of motion. Neck supple. No tracheal deviation present.  Cardiovascular: Normal rate, regular rhythm, S1 normal, S2 normal, normal heart sounds, intact distal pulses and normal pulses.   Pulmonary/Chest: Effort normal. No respiratory distress. He has no decreased breath sounds. He has wheezes in the right upper field, the right lower field, the left upper field and the left lower field. He has no rhonchi. He has no rales.  Abdominal: Normal appearance and bowel sounds are normal. There is no tenderness.  Musculoskeletal: Normal range  of motion.  Neurological: He is alert and oriented to person, place, and time.  Skin: Skin is warm and dry.  Psychiatric: He has a normal mood and affect. His speech is normal and behavior is normal. Thought content normal.  Nursing note and vitals reviewed.    ED Treatments / Results  Labs (all labs ordered are listed, but only abnormal results are displayed) Labs Reviewed  LIPASE, BLOOD - Abnormal; Notable for the following:       Result Value   Lipase 54 (*)    All other components within normal limits  COMPREHENSIVE METABOLIC PANEL - Abnormal; Notable for the following:    Glucose, Bld 101 (*)    All other components within normal limits  CBC - Abnormal; Notable for the following:    WBC 10.8 (*)    Hemoglobin 12.3 (*)    HCT 38.1 (*)    MCV 72.2 (*)    MCH 23.3 (*)    All other components within normal limits  URINALYSIS, ROUTINE W REFLEX MICROSCOPIC    EKG  EKG Interpretation None       Radiology No results found.  Procedures Procedures (including critical care time)  Medications Ordered in ED Medications  sodium chloride 0.9 % bolus 1,000 mL (1,000 mLs Intravenous New Bag/Given 04/09/17 1604)  predniSONE (DELTASONE) tablet 60 mg (not administered)  ipratropium-albuterol (DUONEB) 0.5-2.5 (3) MG/3ML nebulizer solution 5 mL (5 mLs Nebulization Given 04/09/17 1557)     Initial Impression / Assessment and Plan / ED Course  I have reviewed the triage vital signs and the nursing notes.  Pertinent labs & imaging results that were available during my care of the patient were reviewed by me and considered in my medical decision making (see chart for details).  Final Clinical Impressions(s) / ED Diagnoses  {I have reviewed and evaluated the relevant laboratory values. {I have reviewed and evaluated the relevant imaging studies.  {I have reviewed the relevant previous healthcare records.  {I obtained HPI from historian.   ED Course:  Assessment: Pt is a 51 y.o.  male with a hx of Asthma, presents to the Emergency Department today due to cough since Friday. Notes URI symptoms of congestion, rhinorrhea, sinus pressure. Notes productive cough. No fevers. No CP. Does not SOB with asthma being triggered. Noted N/V/D. Since resolved today. No abdominal pain. No sick contacts. No headaches. No neck stiffness.. On exam, pt in NAD. Nontoxic/nonseptic appearing. VS. Afebrile. Lungs with diffuse wheeze. Heart RRR. Abdomen nontender soft. CBC unremarkable. CMP unremarkable. Mild elevation in Lipase at 54.. CXR unremarkable.  Given fluids, albuterol and streoids in ED. Plan is to DC home with follow up to PCP. Likely viral syndrome with asthma exacerbation. At time of discharge, Patient is in no acute distress. Vital Signs are stable. Patient is able to ambulate. Patient able to tolerate PO.   Disposition/Plan:  DC Home Additional Verbal discharge instructions given  and discussed with patient.  Pt Instructed to f/u with PCP in the next week for evaluation and treatment of symptoms. Return precautions given Pt acknowledges and agrees with plan  Supervising Physician Mesner, Barbara Cower, MD  Final diagnoses:  Mild intermittent asthma with exacerbation  Viral syndrome    New Prescriptions New Prescriptions   No medications on file     Audry Pili, Cordelia Poche 04/09/17 1645    Mesner, Barbara Cower, MD 04/10/17 0105

## 2017-04-09 NOTE — Discharge Instructions (Signed)
Please read and follow all provided instructions.  Your diagnoses today include:  1. Mild intermittent asthma with exacerbation   2. Viral syndrome     Tests performed today include: Vital signs. See below for your results today.   Medications prescribed:  Take as prescribed   Home care instructions:  Follow any educational materials contained in this packet.  Follow-up instructions: Please follow-up with your primary care provider for further evaluation of symptoms and treatment   Return instructions:  Please return to the Emergency Department if you do not get better, if you get worse, or new symptoms OR  - Fever (temperature greater than 101.3F)  - Bleeding that does not stop with holding pressure to the area    -Severe pain (please note that you may be more sore the day after your accident)  - Chest Pain  - Difficulty breathing  - Severe nausea or vomiting  - Inability to tolerate food and liquids  - Passing out  - Skin becoming red around your wounds  - Change in mental status (confusion or lethargy)  - New numbness or weakness    Please return if you have any other emergent concerns.  Additional Information:  Your vital signs today were: BP (!) 175/80    Pulse (!) 109    Temp 98 F (36.7 C) (Oral)    Resp 15    SpO2 97%  If your blood pressure (BP) was elevated above 135/85 this visit, please have this repeated by your doctor within one month. ---------------

## 2017-04-09 NOTE — ED Triage Notes (Signed)
Pt reports productive cough, HA, and n/v/d for the past 3 days.

## 2017-04-09 NOTE — ED Notes (Signed)
Pt informed about current wait. 

## 2017-04-09 NOTE — ED Notes (Signed)
Pt given coca-cola to drink for PO challenge. He said, "that's what I needed. I feel better after that and the breathing treatment."

## 2017-04-09 NOTE — ED Notes (Signed)
Patient transported to X-ray 

## 2017-11-01 ENCOUNTER — Emergency Department (HOSPITAL_COMMUNITY): Payer: Self-pay

## 2017-11-01 ENCOUNTER — Encounter (HOSPITAL_COMMUNITY): Payer: Self-pay | Admitting: *Deleted

## 2017-11-01 ENCOUNTER — Emergency Department (HOSPITAL_COMMUNITY)
Admission: EM | Admit: 2017-11-01 | Discharge: 2017-11-01 | Disposition: A | Discharge status: Home / Self Care | Payer: Self-pay | Attending: Emergency Medicine | Admitting: Emergency Medicine

## 2017-11-01 DIAGNOSIS — J45909 Unspecified asthma, uncomplicated: Secondary | ICD-10-CM | POA: Insufficient documentation

## 2017-11-01 DIAGNOSIS — F17210 Nicotine dependence, cigarettes, uncomplicated: Secondary | ICD-10-CM | POA: Insufficient documentation

## 2017-11-01 DIAGNOSIS — M25562 Pain in left knee: Secondary | ICD-10-CM | POA: Insufficient documentation

## 2017-11-01 DIAGNOSIS — Z79899 Other long term (current) drug therapy: Secondary | ICD-10-CM | POA: Insufficient documentation

## 2017-11-01 MED ORDER — ACETAMINOPHEN 325 MG PO TABS
650.0000 mg | ORAL_TABLET | Freq: Once | ORAL | Status: AC
  Administered 2017-11-01: 650 mg via ORAL
  Filled 2017-11-01: qty 2

## 2017-11-01 NOTE — ED Provider Notes (Signed)
[facility]-EMERGENCY DEPT Provider Note   CSN: [id number] Arrival date & time: 11/01/17  0714     History   Chief Complaint Chief Complaint  Patient presents with  . Leg Pain    Jesse AILTON Key is a 52 y.o. male.  Jesse   Jesse Key is a 52 year old male with a history of asthma who presents to the emergency department for evaluation of left knee pain.  Patient reports that his pain began yesterday.  Denies inciting injury or trauma that he is aware of, although states that he is on his feet for long periods of time as he works as a Administrator and as a Investment banker, operational.  Also reports he is often on his knees when working as a Administrator.  He reports that pain is located on the lateral aspect of the left knee and radiates over the lateral aspect of the left thigh.  Pain is only present with weightbearing, no pain with sitting still.  Pain is described as pins-and-needles and is 9/10 in severity at its worst.  He tried taking Tylenol yesterday which is improved his symptoms, but only temporarily.  He denies fevers, chills, numbness, weakness, knee swelling, erythema, bruising or open wound.  Denies prior knee surgeries.  Past Medical History:  Diagnosis Date  . Asthma     There are no active problems to display for this patient.   History reviewed. No pertinent surgical history.      Home Medications    Prior to Admission medications   Medication Sig Start Date End Date Taking? Authorizing Provider  acetaminophen (TYLENOL) 500 MG tablet Take 1,000 mg by mouth every 6 (six) hours as needed for pain.    [provider]  albuterol (PROVENTIL HFA;VENTOLIN HFA) 108 (90 Base) MCG/ACT inhaler Inhale 1-2 puffs into the lungs every 6 (six) hours as needed for wheezing or shortness of breath. 04/09/17   Audry Pili, PA-C  DM-Doxylamine-Acetaminophen (NYQUIL COLD & FLU PO) Take 30 mLs by mouth at bedtime as needed (cold/flu symptoms).    [provider]    HYDROcodone-acetaminophen (NORCO) 5-325 MG tablet Take 1 tablet by mouth every 4 (four) hours as needed. 08/04/15   Mancel Bale, MD  ibuprofen (ADVIL,MOTRIN) 600 MG tablet Take 1 tablet (600 mg total) by mouth every 6 (six) hours as needed. 08/18/16   Jacalyn Lefevre, MD  pseudoephedrine (SUDAFED) 30 MG tablet Take 1 tablet (30 mg total) by mouth every 4 (four) hours as needed for congestion. 08/18/16   Jacalyn Lefevre, MD  triamcinolone (KENALOG) 0.025 % ointment Apply topically 2 (two) times daily. Do not apply to face 03/27/13   Antony Madura, PA-C    Family History Family History  Problem Relation Age of Onset  . Diabetes Mother   . Hypertension Mother   . Diabetes Father   . Hypertension Father     Social History Social History   Tobacco Use  . Smoking status: Current Some Day Smoker    Types: Cigarettes  . Smokeless tobacco: Never Used  Substance Use Topics  . Alcohol use: Yes    Comment: weekends only  . Drug use: No     Allergies   Shrimp [shellfish allergy]   Review of Systems Review of Systems  Constitutional: Negative for chills and fever.  Musculoskeletal: Positive for arthralgias (left knee). Negative for joint swelling.  Skin: Negative for color change and wound.  Neurological: Negative for weakness and numbness.  Psychiatric/Behavioral: Negative for agitation.  Physical Exam Updated Vital Signs BP (!) 154/99 (BP Location: Right Arm)   Pulse 98   Temp 97.9 F (36.6 C) (Oral)   Resp 18   SpO2 100%   Physical Exam  Constitutional: He is oriented to person, place, and time. He appears well-developed and well-nourished. No distress.  HENT:  Head: Normocephalic and atraumatic.  Eyes: Right eye exhibits no discharge. Left eye exhibits no discharge.  Pulmonary/Chest: Effort normal. No respiratory distress.  Musculoskeletal:  Left knee with tenderness to palpation of lateral aspect of the joint. Full active ROM. No joint line tenderness. No joint  effusion or swelling appreciated. No abnormal alignment or patellar mobility. No bruising, erythema or warmth overlaying the joint. No varus/valgus laxity. Negative drawer's, and McMurray's.  No crepitus.  2+ DP pulses bilaterally. All compartments are soft. Sensation to light touch intact in bilateral LE.   Neurological: He is alert and oriented to person, place, and time. Coordination normal.  Skin: Skin is warm and dry. Capillary refill takes less than 2 seconds. He is not diaphoretic.  Psychiatric: He has a normal mood and affect. His behavior is normal.  Nursing note and vitals reviewed.    ED Treatments / Results  Labs (all labs ordered are listed, but only abnormal results are displayed) Labs Reviewed - No data to display  EKG None  Radiology Dg Knee Complete 4 Views Left  Result Date: 11/01/2017 CLINICAL DATA:  Atraumatic knee pain 1 day EXAM: LEFT KNEE - COMPLETE 4+ VIEW COMPARISON:  None. FINDINGS: Joint spaces normal.  No degenerative change or effusion. Ligament ossification adjacent to the proximal fibula due to chronic injury. Benign-appearing sclerotic intramedullary lesion proximal tibia IMPRESSION: No acute abnormality.  No significant arthropathy in the knee. Electronically Signed   By: Marlan Palau M.D.   On: 11/01/2017 08:44    Procedures Procedures (including critical care time)  Medications Ordered in ED Medications  acetaminophen (TYLENOL) tablet 650 mg (650 mg Oral Given 11/01/17 0801)     Initial Impression / Assessment and Plan / ED Course  I have reviewed the triage vital signs and the nursing notes.  Pertinent labs & imaging results that were available during my care of the patient were reviewed by me and considered in my medical decision making (see chart for details).    Xray left knee without acute fracture or abnormality. No varus/valgus laxity and negative drawers, do not suspect ligamentous injury. No swelling, erythema, warmth or limited ROM to  suggest infectious process or septic joint. LLE neurovascularly intact. Will discharge patient with knee sleeve. Have counseled him on Tylenol and RICE protocol.  His blood pressure was elevated in the ER today, counseled him to have this rechecked.  Have given him information to establish care with [facility].  Discussed reasons to return to the ED. He agrees and voices understanding to the above plan and has no complaints prior to discharge.   Final Clinical Impressions(s) / ED Diagnoses   Final diagnoses:  Acute pain of left knee    ED Discharge Orders    None       Kellie Shropshire, PA-C 11/01/17 0981    Derwood Kaplan, MD 11/01/17 [id number]

## 2017-11-01 NOTE — ED Notes (Signed)
Ortho tech called 

## 2017-11-01 NOTE — Discharge Instructions (Addendum)
X-ray was reassuring.  Please take Tylenol at home for your pain.  You can also wear the knee sleeve for support.  Your blood pressure was elevated in the ER today, please have this rechecked by regular doctor.  I [facility] the [facility], they are located across the street from [facility] and accept patients who do not have insurance.  Please call to establish care.  I have written you a work note.  Return to the ER if you have any new or concerning symptoms.

## 2017-11-01 NOTE — ED Triage Notes (Signed)
Pt complains of left hip pain radiating down leg for the past 2 days. Pt denies injury, states he walks a lot.

## 2017-12-06 ENCOUNTER — Ambulatory Visit (INDEPENDENT_AMBULATORY_CARE_PROVIDER_SITE_OTHER): Payer: Medicaid Other | Admitting: Physician Assistant

## 2018-01-12 ENCOUNTER — Emergency Department (HOSPITAL_COMMUNITY)
Admission: EM | Admit: 2018-01-12 | Discharge: 2018-01-12 | Disposition: A | Discharge status: Home / Self Care | Payer: Medicaid Other | Attending: Emergency Medicine | Admitting: Emergency Medicine

## 2018-01-12 ENCOUNTER — Encounter (HOSPITAL_COMMUNITY): Payer: Self-pay | Admitting: Emergency Medicine

## 2018-01-12 DIAGNOSIS — S61212A Laceration without foreign body of right middle finger without damage to nail, initial encounter: Secondary | ICD-10-CM | POA: Insufficient documentation

## 2018-01-12 DIAGNOSIS — Z79899 Other long term (current) drug therapy: Secondary | ICD-10-CM | POA: Insufficient documentation

## 2018-01-12 DIAGNOSIS — W260XXA Contact with knife, initial encounter: Secondary | ICD-10-CM | POA: Insufficient documentation

## 2018-01-12 DIAGNOSIS — F17210 Nicotine dependence, cigarettes, uncomplicated: Secondary | ICD-10-CM | POA: Insufficient documentation

## 2018-01-12 DIAGNOSIS — Z23 Encounter for immunization: Secondary | ICD-10-CM | POA: Insufficient documentation

## 2018-01-12 DIAGNOSIS — J45909 Unspecified asthma, uncomplicated: Secondary | ICD-10-CM | POA: Insufficient documentation

## 2018-01-12 DIAGNOSIS — Y999 Unspecified external cause status: Secondary | ICD-10-CM | POA: Insufficient documentation

## 2018-01-12 DIAGNOSIS — Y939 Activity, unspecified: Secondary | ICD-10-CM | POA: Insufficient documentation

## 2018-01-12 DIAGNOSIS — Y929 Unspecified place or not applicable: Secondary | ICD-10-CM | POA: Insufficient documentation

## 2018-01-12 MED ORDER — TETANUS-DIPHTH-ACELL PERTUSSIS 5-2.5-18.5 LF-MCG/0.5 IM SUSP
0.5000 mL | Freq: Once | INTRAMUSCULAR | Status: AC
  Administered 2018-01-12: 0.5 mL via INTRAMUSCULAR
  Filled 2018-01-12: qty 0.5

## 2018-01-12 NOTE — ED Provider Notes (Signed)
[facility]-EMERGENCY DEPT Provider Note   CSN: [id number] Arrival date & time: 01/12/18  1317     History   Chief Complaint Chief Complaint  Patient presents with  . Laceration    HPI Jesse Key is a 52 y.o. male.  The history is provided by the patient. No language interpreter was used.  Laceration   The incident occurred yesterday. The laceration is located on the right hand. The laceration is 1 cm in size. The laceration mechanism was a a dirty knife. The pain is mild. The pain has been constant since onset. He reports no foreign bodies present. His tetanus status is unknown.   Pt cut his finger on a knife in a sink at work.  Pt reports ara continues to bleed.  Past Medical History:  Diagnosis Date  . Asthma     There are no active problems to display for this patient.   History reviewed. No pertinent surgical history.      Home Medications    Prior to Admission medications   Medication Sig Start Date End Date Taking? Authorizing Provider  albuterol (PROVENTIL HFA;VENTOLIN HFA) 108 (90 Base) MCG/ACT inhaler Inhale 1-2 puffs into the lungs every 6 (six) hours as needed for wheezing or shortness of breath. 04/09/17  Yes Audry PiliMohr, Tyler, PA-C  triamcinolone (KENALOG) 0.025 % ointment Apply topically 2 (two) times daily. Do not apply to face Patient not taking: Reported on 01/12/2018 03/27/13   Antony MaduraHumes, Kelly, PA-C    Family History Family History  Problem Relation Age of Onset  . Diabetes Mother   . Hypertension Mother   . Diabetes Father   . Hypertension Father     Social History Social History   Tobacco Use  . Smoking status: Current Some Day Smoker    Types: Cigarettes  . Smokeless tobacco: Never Used  Substance Use Topics  . Alcohol use: Yes    Comment: weekends only  . Drug use: No     Allergies   Shrimp [shellfish allergy]   Review of Systems Review of Systems  All other systems reviewed and are  negative.    Physical Exam Updated Vital Signs BP (!) 148/79 (BP Location: Right Arm)   Pulse 87   Temp 98.6 F (37 C) (Oral)   Resp 17   SpO2 97%   Physical Exam  Constitutional: He appears well-developed and well-nourished.  Musculoskeletal: He exhibits tenderness.  1cm laceration palmar aspect of right 3rd finger,  No gapping.  Area bleeds when pt pulls apart,  nv and ns intact   Neurological: He is alert.  Skin: Skin is warm.  Psychiatric: He has a normal mood and affect.  Nursing note and vitals reviewed.    ED Treatments / Results  Labs (all labs ordered are listed, but only abnormal results are displayed) Labs Reviewed - No data to display  EKG None  Radiology No results found.  Procedures Procedures (including critical care time)  Medications Ordered in ED Medications - No data to display   Initial Impression / Assessment and Plan / ED Course  I have reviewed the triage vital signs and the nursing notes.  Pertinent labs & imaging results that were available during my care of the patient were reviewed by me and considered in my medical decision making (see chart for details).     Laceration is greater than 24 hours old,  Superficial,  Pt advised keep bandaged, will splint  Sutures/glue not indicated due to age.  Final Clinical Impressions(s) / ED Diagnoses   Final diagnoses:  Laceration of right middle finger without damage to nail, foreign body presence unspecified, initial encounter    ED Discharge Orders    None    An After Visit Summary was printed and given to the patient.   Elson Areas, Cordelia Poche 01/12/18 1404    Tilden Fossa, MD 01/12/18 1627

## 2018-01-12 NOTE — Discharge Instructions (Signed)
Return if any problems.

## 2018-01-12 NOTE — ED Triage Notes (Addendum)
Patient reports laceration to right middle finger yesterday at work. States he stuck his hand in the sink with a knife. Bleeding controlled. Movement and sensation to finger.

## 2018-09-10 ENCOUNTER — Emergency Department (HOSPITAL_COMMUNITY): Payer: Medicaid Other

## 2018-09-10 ENCOUNTER — Encounter (HOSPITAL_COMMUNITY): Payer: Self-pay

## 2018-09-10 ENCOUNTER — Emergency Department (HOSPITAL_COMMUNITY)
Admission: EM | Admit: 2018-09-10 | Discharge: 2018-09-10 | Disposition: A | Discharge status: Home / Self Care | Payer: Medicaid Other | Attending: Emergency Medicine | Admitting: Emergency Medicine

## 2018-09-10 ENCOUNTER — Other Ambulatory Visit: Payer: Self-pay

## 2018-09-10 DIAGNOSIS — Y929 Unspecified place or not applicable: Secondary | ICD-10-CM | POA: Insufficient documentation

## 2018-09-10 DIAGNOSIS — W1789XA Other fall from one level to another, initial encounter: Secondary | ICD-10-CM | POA: Insufficient documentation

## 2018-09-10 DIAGNOSIS — Y9389 Activity, other specified: Secondary | ICD-10-CM | POA: Insufficient documentation

## 2018-09-10 DIAGNOSIS — J45909 Unspecified asthma, uncomplicated: Secondary | ICD-10-CM | POA: Insufficient documentation

## 2018-09-10 DIAGNOSIS — W19XXXA Unspecified fall, initial encounter: Secondary | ICD-10-CM

## 2018-09-10 DIAGNOSIS — M79671 Pain in right foot: Secondary | ICD-10-CM | POA: Insufficient documentation

## 2018-09-10 DIAGNOSIS — Y998 Other external cause status: Secondary | ICD-10-CM | POA: Insufficient documentation

## 2018-09-10 DIAGNOSIS — S0083XA Contusion of other part of head, initial encounter: Secondary | ICD-10-CM | POA: Insufficient documentation

## 2018-09-10 DIAGNOSIS — F17210 Nicotine dependence, cigarettes, uncomplicated: Secondary | ICD-10-CM | POA: Insufficient documentation

## 2018-09-10 DIAGNOSIS — M25511 Pain in right shoulder: Secondary | ICD-10-CM | POA: Insufficient documentation

## 2018-09-10 DIAGNOSIS — S0990XA Unspecified injury of head, initial encounter: Secondary | ICD-10-CM

## 2018-09-10 HISTORY — DX: Allergy to seafood: Z91.013

## 2018-09-10 NOTE — ED Provider Notes (Signed)
[facility]-EMERGENCY DEPT Provider Note   CSN: [id number] Arrival date & time: 09/10/18  0905    History   Chief Complaint Chief Complaint  Patient presents with  . Fall  . Shoulder Pain  . Foot Pain  . Head Injury    HPI KRAMER HANRAHAN is a 53 y.o. male.     53 year old male with prior medical history as detailed below presents for evaluation following reported fall.  Patient reports that yesterday he fell off a one-story roof.  He did not knock himself out.  He did suffer a head injury.  He also complains of pain to the right shoulder and right foot.  He woke this morning and was more uncomfortable than yesterday afternoon.  He was not evaluated in an emergency department yesterday.  He is not take anything for his symptoms.  The history is provided by the patient and medical records.  Fall  This is a new problem. The current episode started yesterday. The problem occurs rarely. The problem has not changed since onset.Nothing aggravates the symptoms. Nothing relieves the symptoms.  Head Injury  Location:  L temporal Time since incident:  1 day Mechanism of injury: fall   Fall:    Fall occurred:  From a roof   Height of fall:  10 ft   Impact surface:  Dirt   Point of impact:  Head Pain details:    Quality:  Aching   Severity:  Mild Relieved by:  Nothing Worsened by:  Nothing   Past Medical History:  Diagnosis Date  . Asthma   . Shellfish allergy     There are no active problems to display for this patient.   History reviewed. No pertinent surgical history.      Home Medications    Prior to Admission medications   Medication Sig Start Date End Date Taking? Authorizing Provider  albuterol (PROVENTIL HFA;VENTOLIN HFA) 108 (90 Base) MCG/ACT inhaler Inhale 1-2 puffs into the lungs every 6 (six) hours as needed for wheezing or shortness of breath. 04/09/17  Yes Audry Pili, PA-C  naproxen sodium (ALEVE) 220 MG tablet Take 440 mg by mouth  every 4 (four) hours as needed (for pain).   Yes [provider]    Family History Family History  Problem Relation Age of Onset  . Diabetes Mother   . Hypertension Mother   . Diabetes Father   . Hypertension Father     Social History Social History   Tobacco Use  . Smoking status: Current Some Day Smoker    Packs/day: 0.25    Types: Cigarettes  . Smokeless tobacco: Never Used  Substance Use Topics  . Alcohol use: Yes    Comment: weekends only  . Drug use: No     Allergies   Shrimp [shellfish allergy]   Review of Systems Review of Systems  All other systems reviewed and are negative.    Physical Exam Updated Vital Signs BP (!) 162/90   Pulse 87   Temp 98.6 F (37 C) (Oral)   Resp 18   Ht  (1.676 m)   Wt 65.8 kg   SpO2 100%   BMI 23.40 kg/m   Physical Exam Vitals signs and nursing note reviewed.  Constitutional:      General: He is not in acute distress.    Appearance: Normal appearance. He is well-developed.  HENT:     Head: Normocephalic.     Comments: Mild contusion noted to the left temple  and left cheek.    Right Ear: Tympanic membrane and ear canal normal.     Left Ear: Tympanic membrane and ear canal normal.     Nose: Nose normal.     Mouth/Throat:     Mouth: Mucous membranes are moist.  Eyes:     Extraocular Movements: Extraocular movements intact.     Conjunctiva/sclera: Conjunctivae normal.     Pupils: Pupils are equal, round, and reactive to light.  Neck:     Musculoskeletal: Normal range of motion and neck supple.  Cardiovascular:     Rate and Rhythm: Normal rate and regular rhythm.     Heart sounds: Normal heart sounds.  Pulmonary:     Effort: Pulmonary effort is normal. No respiratory distress.     Breath sounds: Normal breath sounds.  Abdominal:     General: There is no distension.     Palpations: Abdomen is soft.     Tenderness: There is no abdominal tenderness.  Musculoskeletal: Normal range of motion.         General: No deformity.  Skin:    General: Skin is warm and dry.  Neurological:     General: No focal deficit present.     Mental Status: He is alert and oriented to person, place, and time. Mental status is at baseline.     Cranial Nerves: No cranial nerve deficit.     Sensory: No sensory deficit.     Motor: No weakness.     Gait: Gait normal.     Comments: GCS 15      ED Treatments / Results  Labs (all labs ordered are listed, but only abnormal results are displayed) Labs Reviewed - No data to display  EKG None  Radiology Dg Shoulder Right  Result Date: 09/10/2018 CLINICAL DATA:  Left shoulder pain since a fall off a roof yesterday. Initial encounter. EXAM: RIGHT SHOULDER - 2+ VIEW COMPARISON:  None. FINDINGS: There is no evidence of fracture or dislocation. There is no evidence of arthropathy or other focal bone abnormality. Soft tissues are unremarkable. IMPRESSION: Normal exam. Electronically Signed   By: Drusilla Kanner M.D.   On: 09/10/2018 10:54   Ct Head Wo Contrast  Result Date: 09/10/2018 CLINICAL DATA:  Larey Seat yesterday from roof while cleaning out gutters; patient has a black eye on the LT side; This morning when he awoke, he felt dizzy. He also c/o headache. Facial trauma, fx suspected; C-spine fx, traumatic; Headache, post traumatic EXAM: CT HEAD WITHOUT CONTRAST CT MAXILLOFACIAL WITHOUT CONTRAST CT CERVICAL SPINE WITHOUT CONTRAST TECHNIQUE: Multidetector CT imaging of the head, cervical spine, and maxillofacial structures were performed using the standard protocol without intravenous contrast. Multiplanar CT image reconstructions of the cervical spine and maxillofacial structures were also generated. COMPARISON:  None. FINDINGS: CT HEAD FINDINGS Brain: No intracranial hemorrhage. No parenchymal contusion. No midline shift or mass effect. Basilar cisterns are patent. No skull base fracture. No fluid in the paranasal sinuses or mastoid air cells. Orbits are normal. Mild  periventricular white matter hypodensities. Vascular: No hyperdense vessel or unexpected calcification. Skull: Normal. Negative for fracture or focal lesion. Sinuses/Orbits: Paranasal sinuses and mastoid air cells are clear. Orbits are clear. Other: None. CT MAXILLOFACIAL FINDINGS Osseous: No orbital rim fracture. Maxillary sinus walls are intact. Pterygoid plates are normal. No nasal bone fracture. Mandibular condyles are located. No mandibular fracture. Orbits: Globes are normal.  No proptosis intraconal contents normal Sinuses: No fluid in the paranasal sinuses Soft tissues: Unremarkable CT CERVICAL SPINE  FINDINGS Alignment: Normal alignment of the cervical vertebral bodies. Skull base and vertebrae: Normal craniocervical junction. No loss of vertebral body height or disc height. Normal facet articulation. No evidence of fracture. Soft tissues and spinal canal: No prevertebral soft tissue swelling. No perispinal or epidural hematoma. Disc levels:  Unremarkable Upper chest: Clear Other: None IMPRESSION: 1. No intracranial trauma. 2. No facial bone fracture. 3. No cervical spine fracture Electronically Signed   By: Genevive Bi M.D.   On: 09/10/2018 11:31   Ct Cervical Spine Wo Contrast  Result Date: 09/10/2018 CLINICAL DATA:  Larey Seat yesterday from roof while cleaning out gutters; patient has a black eye on the LT side; This morning when he awoke, he felt dizzy. He also c/o headache. Facial trauma, fx suspected; C-spine fx, traumatic; Headache, post traumatic EXAM: CT HEAD WITHOUT CONTRAST CT MAXILLOFACIAL WITHOUT CONTRAST CT CERVICAL SPINE WITHOUT CONTRAST TECHNIQUE: Multidetector CT imaging of the head, cervical spine, and maxillofacial structures were performed using the standard protocol without intravenous contrast. Multiplanar CT image reconstructions of the cervical spine and maxillofacial structures were also generated. COMPARISON:  None. FINDINGS: CT HEAD FINDINGS Brain: No intracranial hemorrhage. No  parenchymal contusion. No midline shift or mass effect. Basilar cisterns are patent. No skull base fracture. No fluid in the paranasal sinuses or mastoid air cells. Orbits are normal. Mild periventricular white matter hypodensities. Vascular: No hyperdense vessel or unexpected calcification. Skull: Normal. Negative for fracture or focal lesion. Sinuses/Orbits: Paranasal sinuses and mastoid air cells are clear. Orbits are clear. Other: None. CT MAXILLOFACIAL FINDINGS Osseous: No orbital rim fracture. Maxillary sinus walls are intact. Pterygoid plates are normal. No nasal bone fracture. Mandibular condyles are located. No mandibular fracture. Orbits: Globes are normal.  No proptosis intraconal contents normal Sinuses: No fluid in the paranasal sinuses Soft tissues: Unremarkable CT CERVICAL SPINE FINDINGS Alignment: Normal alignment of the cervical vertebral bodies. Skull base and vertebrae: Normal craniocervical junction. No loss of vertebral body height or disc height. Normal facet articulation. No evidence of fracture. Soft tissues and spinal canal: No prevertebral soft tissue swelling. No perispinal or epidural hematoma. Disc levels:  Unremarkable Upper chest: Clear Other: None IMPRESSION: 1. No intracranial trauma. 2. No facial bone fracture. 3. No cervical spine fracture Electronically Signed   By: Genevive Bi M.D.   On: 09/10/2018 11:31   Dg Foot 2 Views Right  Result Date: 09/10/2018 CLINICAL DATA:  Fall from roof, foot pain EXAM: RIGHT FOOT - 2 VIEW COMPARISON:  None. FINDINGS: No fracture or dislocation of mid foot or forefoot. The phalanges are normal. The calcaneus is normal. No soft tissue abnormality. IMPRESSION: No fracture or dislocation. Electronically Signed   By: Genevive Bi M.D.   On: 09/10/2018 10:56   Ct Maxillofacial Wo Contrast  Result Date: 09/10/2018 CLINICAL DATA:  Larey Seat yesterday from roof while cleaning out gutters; patient has a black eye on the LT side; This morning when he  awoke, he felt dizzy. He also c/o headache. Facial trauma, fx suspected; C-spine fx, traumatic; Headache, post traumatic EXAM: CT HEAD WITHOUT CONTRAST CT MAXILLOFACIAL WITHOUT CONTRAST CT CERVICAL SPINE WITHOUT CONTRAST TECHNIQUE: Multidetector CT imaging of the head, cervical spine, and maxillofacial structures were performed using the standard protocol without intravenous contrast. Multiplanar CT image reconstructions of the cervical spine and maxillofacial structures were also generated. COMPARISON:  None. FINDINGS: CT HEAD FINDINGS Brain: No intracranial hemorrhage. No parenchymal contusion. No midline shift or mass effect. Basilar cisterns are patent. No skull base fracture. No fluid  in the paranasal sinuses or mastoid air cells. Orbits are normal. Mild periventricular white matter hypodensities. Vascular: No hyperdense vessel or unexpected calcification. Skull: Normal. Negative for fracture or focal lesion. Sinuses/Orbits: Paranasal sinuses and mastoid air cells are clear. Orbits are clear. Other: None. CT MAXILLOFACIAL FINDINGS Osseous: No orbital rim fracture. Maxillary sinus walls are intact. Pterygoid plates are normal. No nasal bone fracture. Mandibular condyles are located. No mandibular fracture. Orbits: Globes are normal.  No proptosis intraconal contents normal Sinuses: No fluid in the paranasal sinuses Soft tissues: Unremarkable CT CERVICAL SPINE FINDINGS Alignment: Normal alignment of the cervical vertebral bodies. Skull base and vertebrae: Normal craniocervical junction. No loss of vertebral body height or disc height. Normal facet articulation. No evidence of fracture. Soft tissues and spinal canal: No prevertebral soft tissue swelling. No perispinal or epidural hematoma. Disc levels:  Unremarkable Upper chest: Clear Other: None IMPRESSION: 1. No intracranial trauma. 2. No facial bone fracture. 3. No cervical spine fracture Electronically Signed   By: Genevive BiStewart  Edmunds M.D.   On: 09/10/2018 11:31      Procedures Procedures (including critical care time)  Medications Ordered in ED Medications - No data to display   Initial Impression / Assessment and Plan / ED Course  I have reviewed the triage vital signs and the nursing notes.  Pertinent labs & imaging results that were available during my care of the patient were reviewed by me and considered in my medical decision making (see chart for details).        MDM  Screen complete  Patient is presenting for evaluation following reported fall.  He reports that he fell from a roof and fell approximately 10 feet landing on hard ground.  This injury occurred yesterday.  He presents today requesting evaluation.  Exam does not suggest significant traumatic injury.  Imaging studies obtained in the ED are without evidence of significant traumatic injury.  Patient does feel improved following his ED evaluation.  He desires discharge home.  Importance of close follow-up is stressed.  Strict return precautions given and understood.  Final Clinical Impressions(s) / ED Diagnoses   Final diagnoses:  Fall, initial encounter  Injury of head, initial encounter    ED Discharge Orders    None       Wynetta FinesMessick, Peter C, MD 09/10/18 1151

## 2018-09-10 NOTE — Discharge Instructions (Addendum)
Please return for any problem.  Follow-up with your regular care provider as instructed.  Use Ibuprofen  - 600 mg taken orally every 8 hours - for pain.

## 2018-09-10 NOTE — ED Notes (Signed)
Patient called out for something to eat or drink. Explained to patient we are waiting for results and will update him.

## 2018-09-10 NOTE — ED Notes (Signed)
Patient transported to CT 

## 2018-09-10 NOTE — ED Triage Notes (Signed)
Patient states he fell of a one story roof yesterday. Patient reports he hit his head on the ground. Patient c/o headache, bruising and hematoma to the left eye.  Patient also c/o right shoulder pain and right foot pain.

## 2018-09-10 NOTE — ED Notes (Signed)
Patient A/Ox4 and able to speak in complete sentences. Patient ambulatory from wheelchair to bed.

## 2018-09-10 NOTE — ED Notes (Signed)
Provider at bedside

## 2021-02-23 IMAGING — CR RIGHT SHOULDER - 2+ VIEW
3 series · 3 of 3 positions shown · non-contrast
Comparison: None.

CLINICAL DATA: Left shoulder pain since a fall off a roof
yesterday. Initial encounter.

EXAM:
RIGHT SHOULDER - 2+ VIEW

[w shoulder external right]
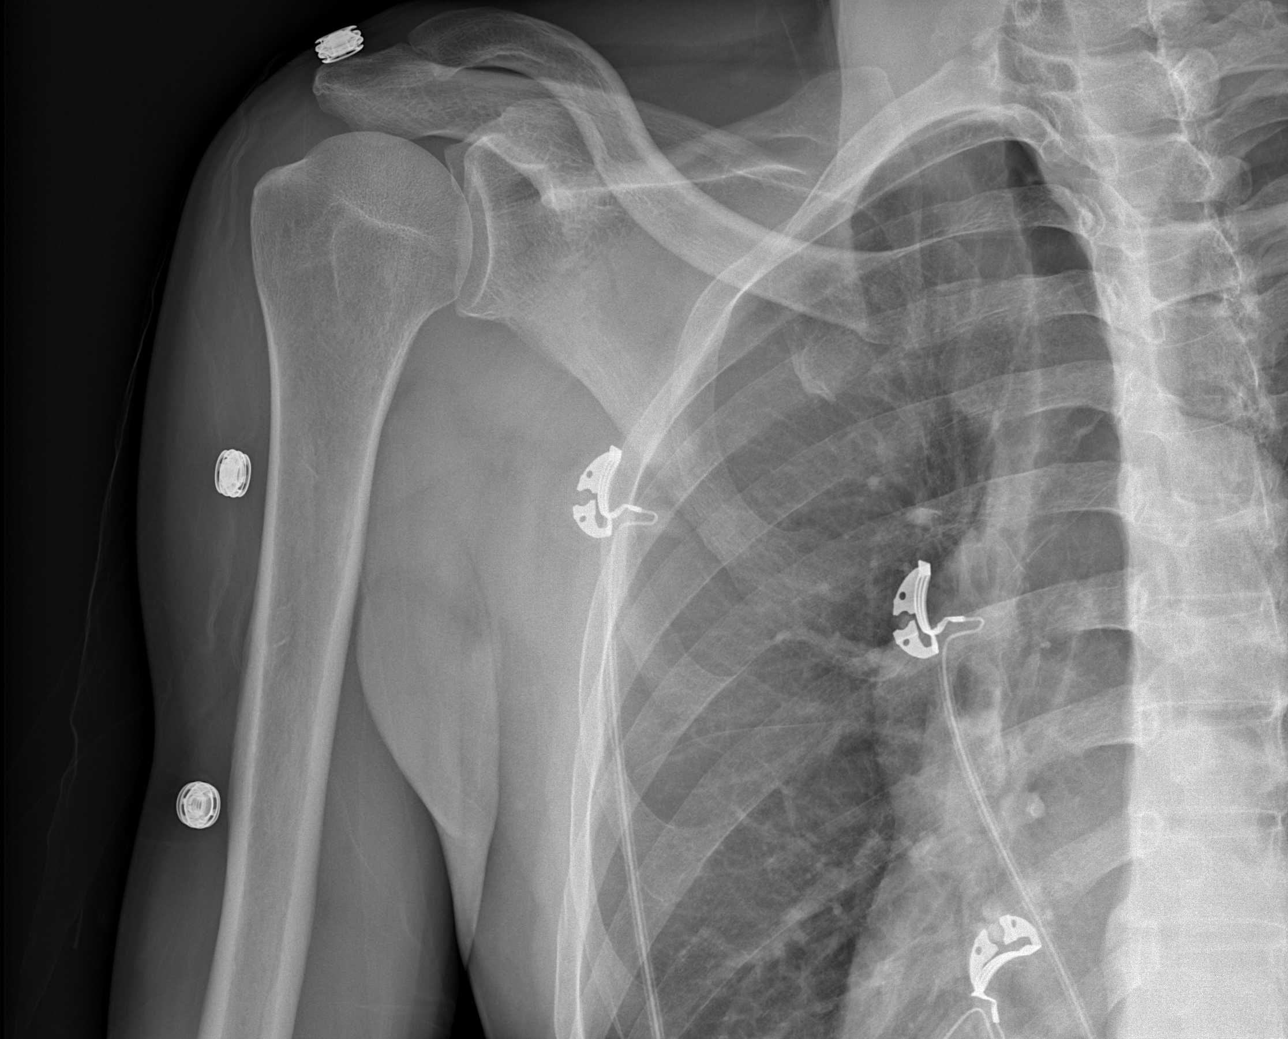

[w shoulder y-view right]
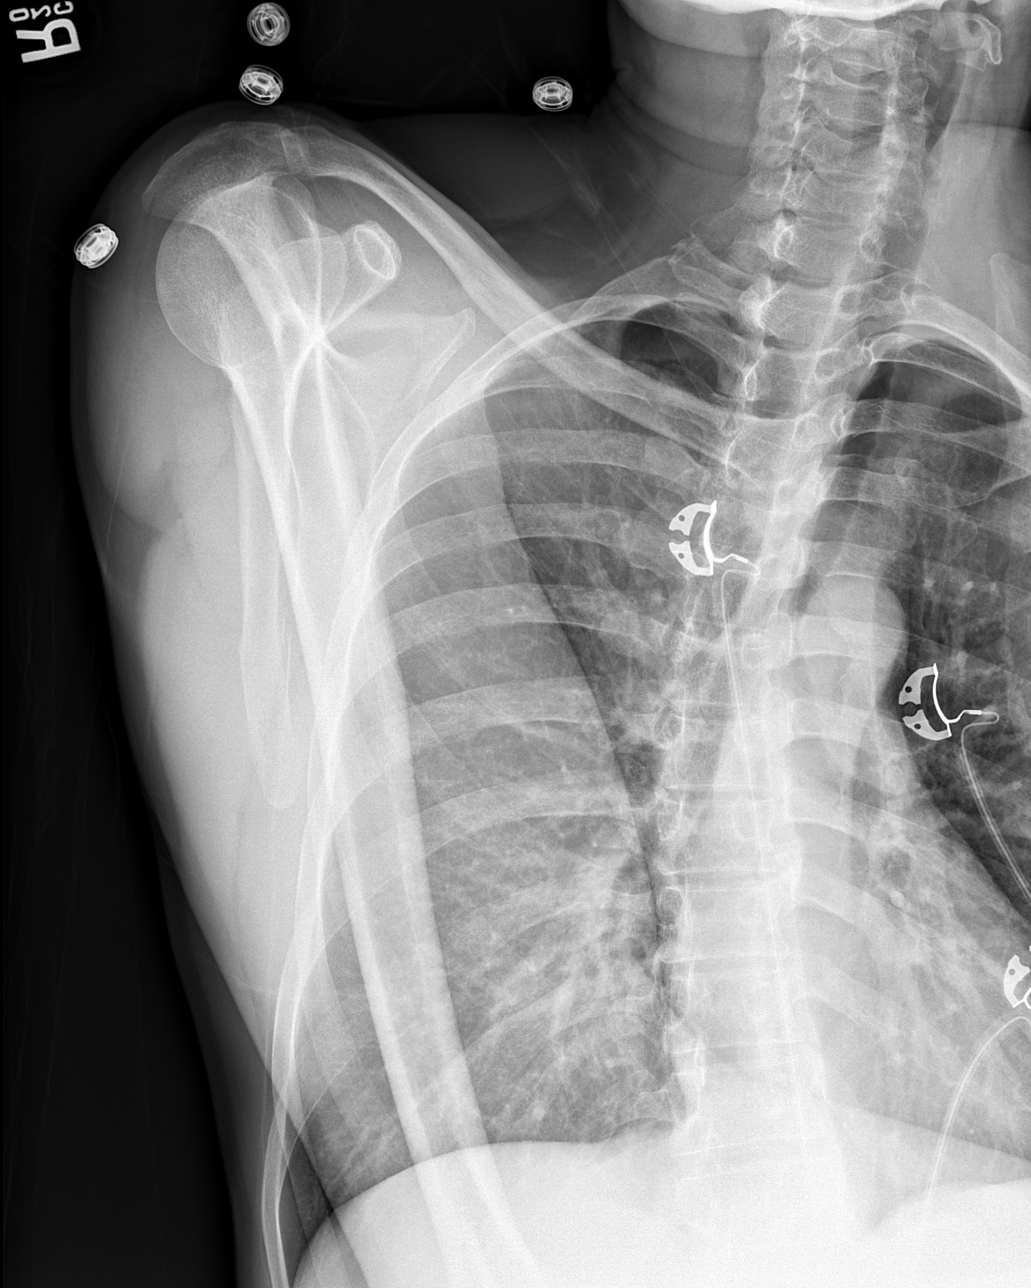

[w shoulder axillary right]
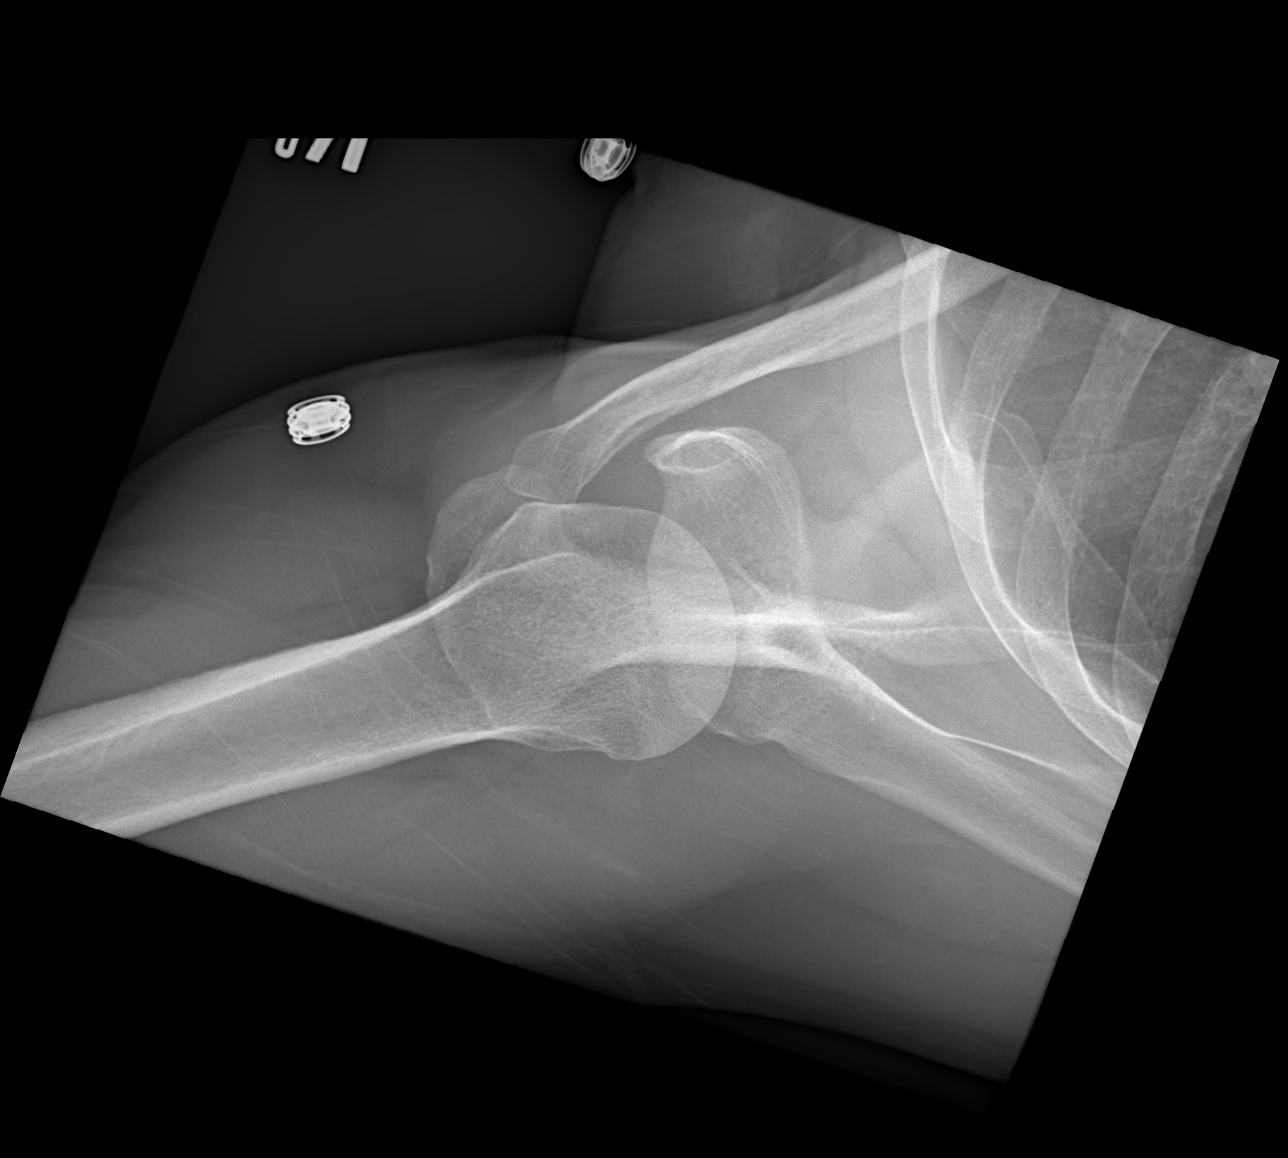

[3 of 3 positions shown; findings below may reference images not displayed]

FINDINGS: There is no evidence of fracture or dislocation. There is no
evidence of arthropathy or other focal bone abnormality. Soft
tissues are unremarkable.
IMPRESSION: Normal exam.

## 2021-02-23 IMAGING — CT CT HEAD WITHOUT CONTRAST
4 of 10 series · 15 of 47 positions shown, 17 images · non-contrast
Comparison: None.

CLINICAL DATA: Fell yesterday from roof while cleaning out gutters;
patient has a black eye on the LT side; This morning when he awoke,
he felt dizzy. He also c/o headache. Facial trauma, fx suspected;
C-spine fx, traumatic; Headache, post traumatic

EXAM:
CT HEAD WITHOUT CONTRAST
CT MAXILLOFACIAL WITHOUT CONTRAST
CT CERVICAL SPINE WITHOUT CONTRAST
TECHNIQUE: Multidetector CT imaging of the head, cervical spine, and
maxillofacial structures were performed using the standard protocol
without intravenous contrast. Multiplanar CT image reconstructions
of the cervical spine and maxillofacial structures were also
generated.

[Series 11: orthogonal axials · axial · 0.23mm/px · z∈[-298,-157]mm · 8 of 95 slices shown, 10 images]
[im 11/95  brain]
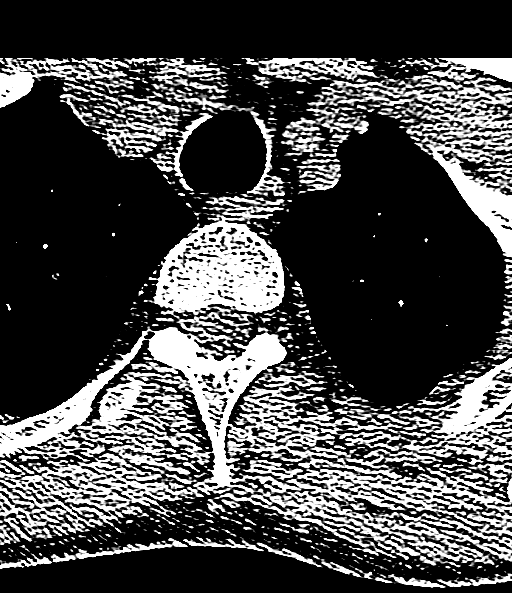
[im 11/95  bone]
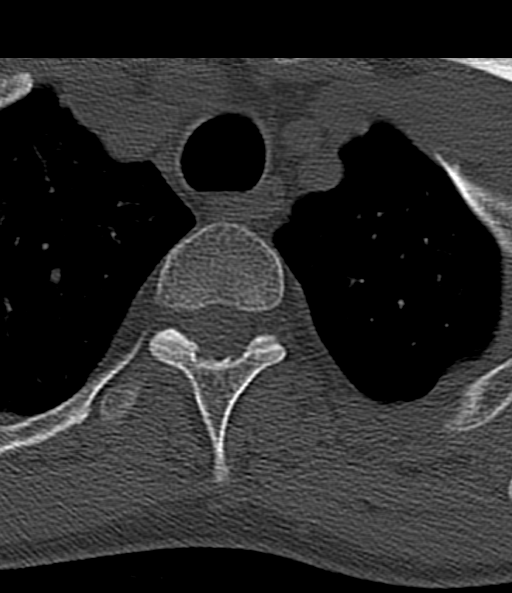
[im 21/95  brain]
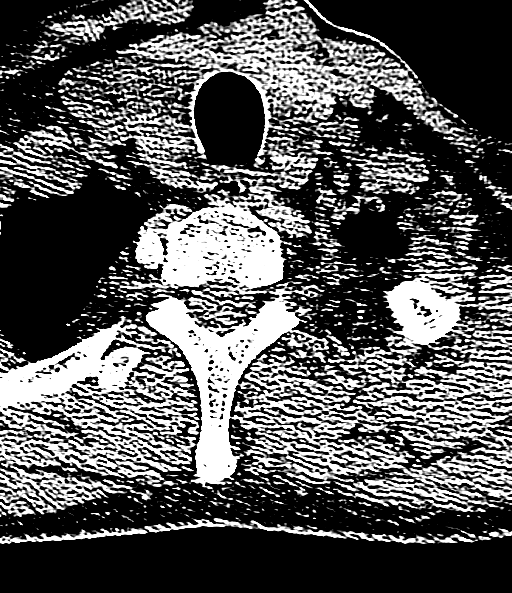
[im 32/95  brain]
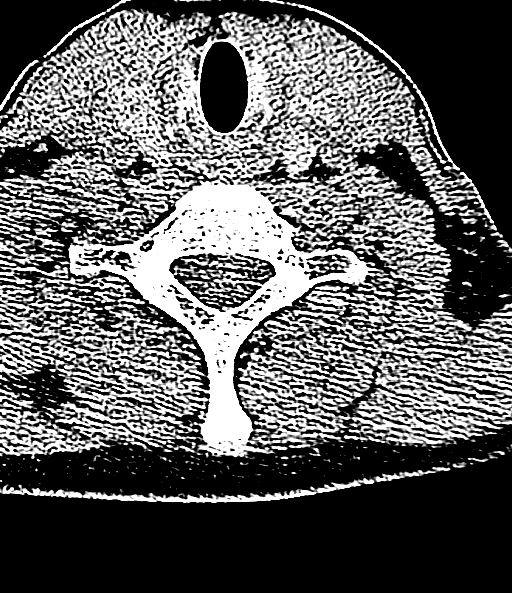
[im 42/95  brain]
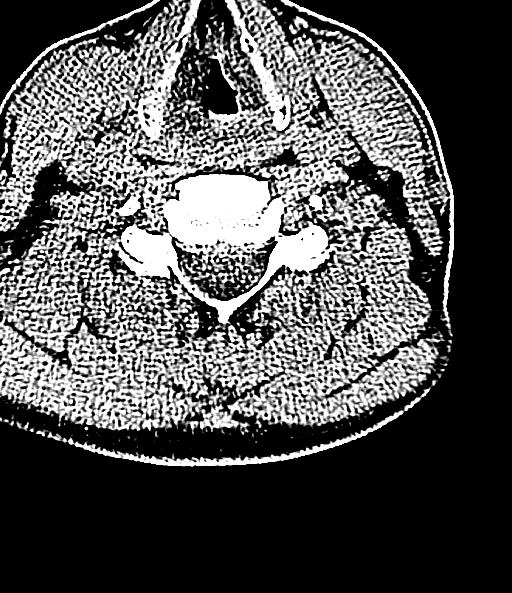
[im 53/95  brain]
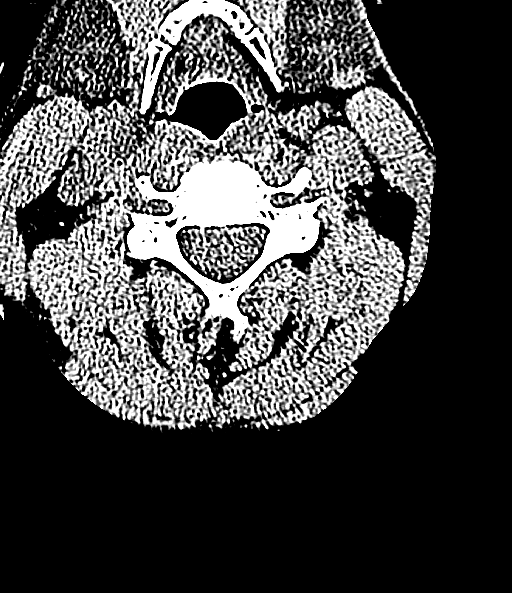
[im 53/95  bone]
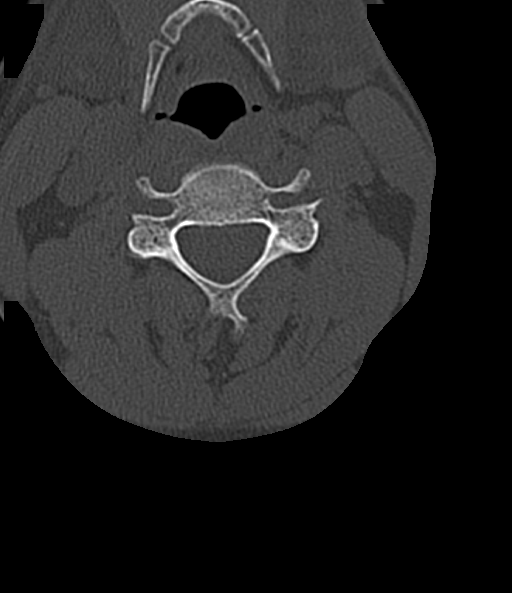
[im 63/95  brain]
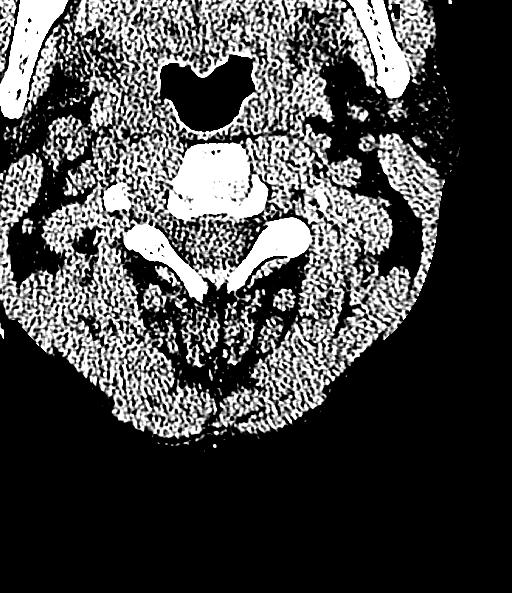
[im 74/95  brain]
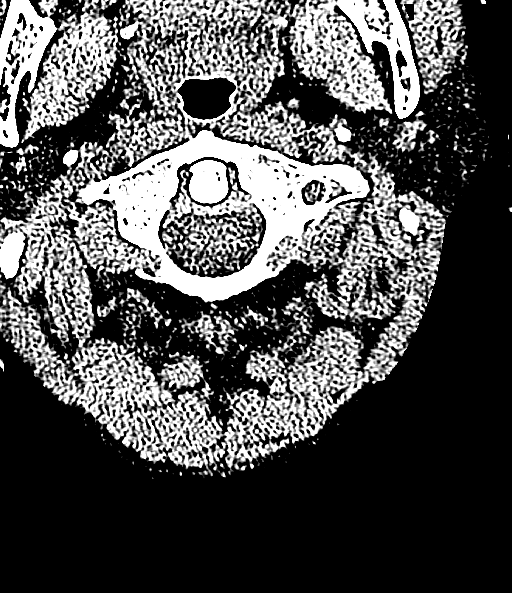
[im 84/95  brain]
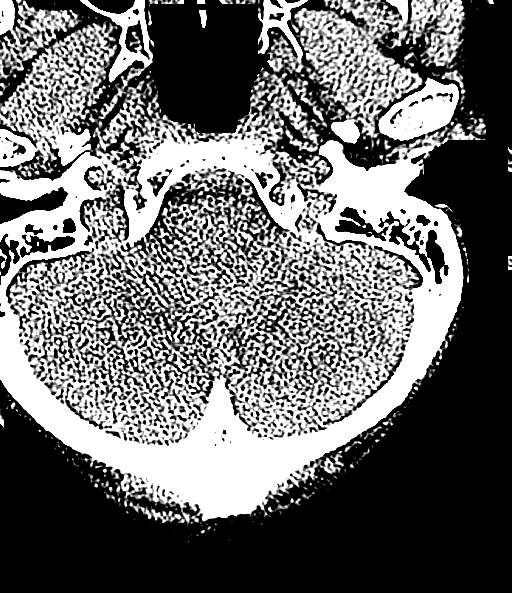

[Series 12: max soft · axial · 0.33mm/px · z∈[-221,-157]mm · 4 of 75 slices shown]
[im 11/75  brain]
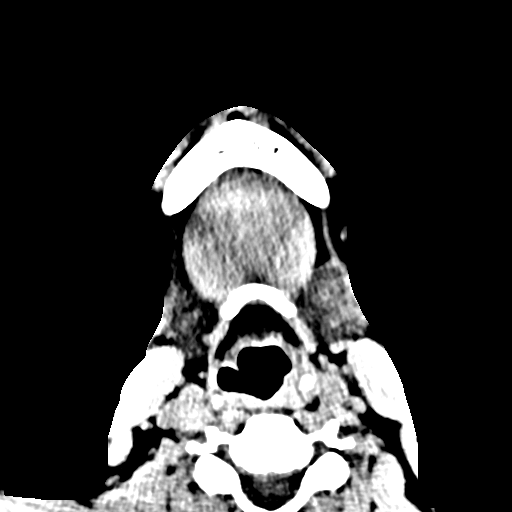
[im 22/75  brain]
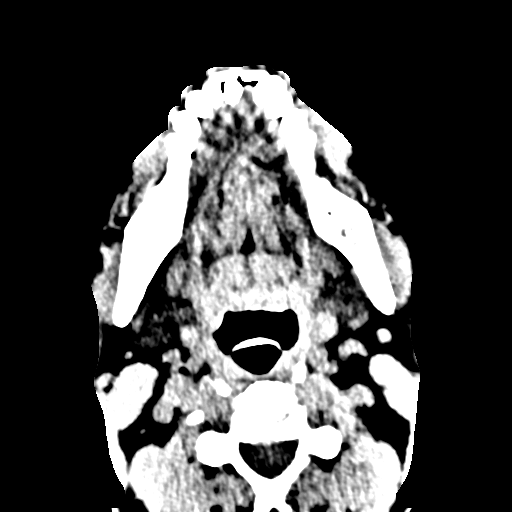
[im 32/75  brain]
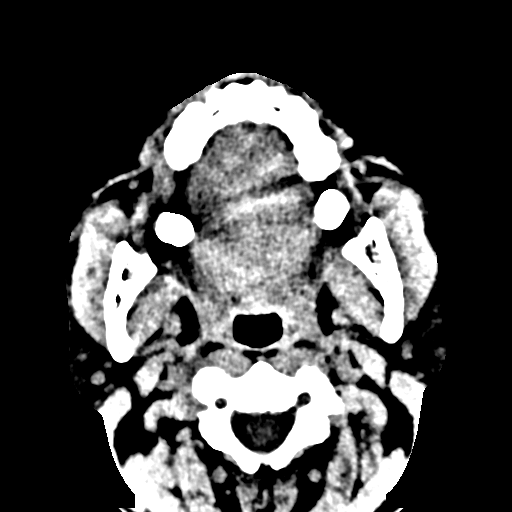
[im 43/75  brain]
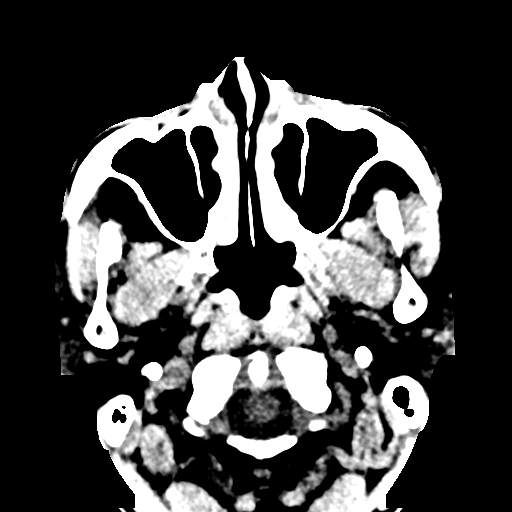

[Series 16: coronal soft · coronal · 0.30mm/px · 2 of 67 slices shown]
[im 23/67  brain]
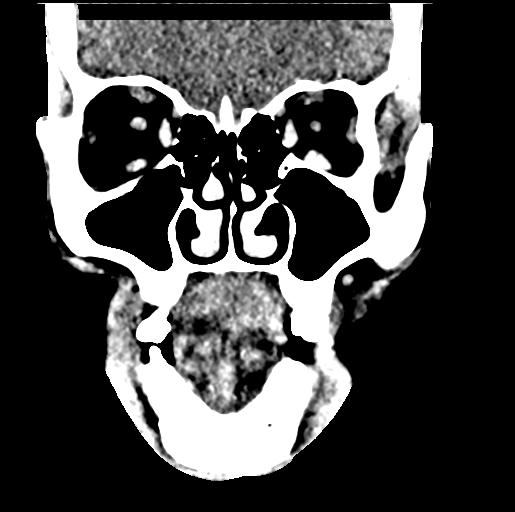
[im 45/67  brain]
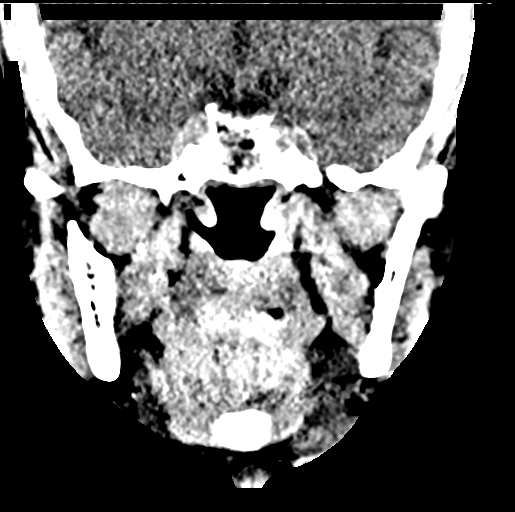

[Series 17: sagittal soft · sagittal · 0.30mm/px · 1 of 85 slices shown]
[im 43/85  brain]
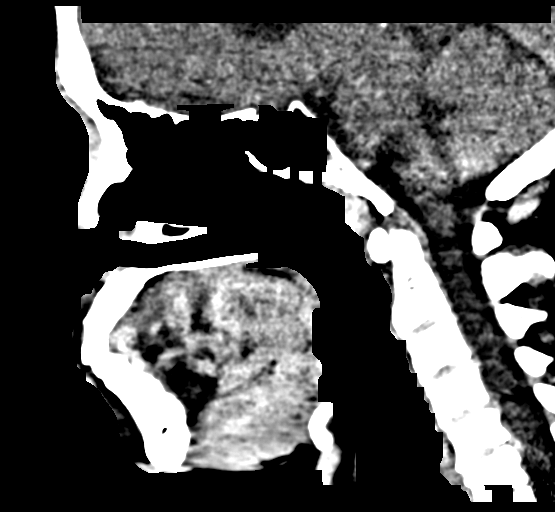

[15 of 47 positions shown; findings below may reference images not displayed]

FINDINGS: CT HEAD FINDINGS

Brain: No intracranial hemorrhage. No parenchymal contusion. No
midline shift or mass effect. Basilar cisterns are patent. No skull
base fracture. No fluid in the paranasal sinuses or mastoid air
cells. Orbits are normal.

Mild periventricular white matter hypodensities.

Vascular: No hyperdense vessel or unexpected calcification.

Skull: Normal. Negative for fracture or focal lesion.

Sinuses/Orbits: Paranasal sinuses and mastoid air cells are clear.
Orbits are clear.

Other: None.

CT MAXILLOFACIAL FINDINGS

Osseous: No orbital rim fracture. Maxillary sinus walls are intact.
Pterygoid plates are normal. No nasal bone fracture. Mandibular
condyles are located. No mandibular fracture.

Orbits: Globes are normal.  No proptosis intraconal contents normal

Sinuses: No fluid in the paranasal sinuses

Soft tissues: Unremarkable

CT CERVICAL SPINE FINDINGS

Alignment: Normal alignment of the cervical vertebral bodies.

Skull base and vertebrae: Normal craniocervical junction. No loss of
vertebral body height or disc height. Normal facet articulation. No
evidence of fracture.

Soft tissues and spinal canal: No prevertebral soft tissue swelling.
No perispinal or epidural hematoma.

Disc levels:  Unremarkable

Upper chest: Clear

Other: None
IMPRESSION: 1. No intracranial trauma.
2. No facial bone fracture.
3. No cervical spine fracture

## 2023-11-17 ENCOUNTER — Emergency Department (HOSPITAL_COMMUNITY)
Admission: EM | Admit: 2023-11-17 | Discharge: 2023-11-17 | Disposition: A | Discharge status: Home / Self Care | Attending: Emergency Medicine | Admitting: Emergency Medicine

## 2023-11-17 ENCOUNTER — Other Ambulatory Visit: Payer: Self-pay

## 2023-11-17 ENCOUNTER — Encounter (HOSPITAL_COMMUNITY): Payer: Self-pay | Admitting: *Deleted

## 2023-11-17 DIAGNOSIS — M545 Low back pain, unspecified: Secondary | ICD-10-CM | POA: Insufficient documentation

## 2023-11-17 MED ORDER — KETOROLAC TROMETHAMINE 15 MG/ML IJ SOLN
15.0000 mg | Freq: Once | INTRAMUSCULAR | Status: AC
  Administered 2023-11-17: 15 mg via INTRAMUSCULAR
  Filled 2023-11-17: qty 1

## 2023-11-17 MED ORDER — CYCLOBENZAPRINE HCL 10 MG PO TABS: 10.0000 mg | ORAL_TABLET | Freq: Two times a day (BID) | ORAL | 0 refills | Status: DC | PRN

## 2023-11-17 NOTE — ED Provider Notes (Signed)
 [place] EMERGENCY DEPARTMENT AT [facility] Provider Note   CSN: [id number] Arrival date & time: 11/17/23  1006     History  Chief Complaint  Patient presents with   Back Pain    Jesse Key is a 58 y.o. male.  58 year old male with prior medical history as detailed below presents for evaluation.  Patient complains of diffuse low back pain.  This began gradually over the last 2 to 3 days.  He denies any specific inciting event.  He denies any trauma or falls.  He denies fever.  He denies weakness.  He took some Tylenol  with some improvement in his pain.  The history is provided by the patient.       Home Medications Prior to Admission medications   Medication Sig Start Date End Date Taking? Authorizing Provider  cyclobenzaprine (FLEXERIL) 10 MG tablet Take 1 tablet (10 mg total) by mouth 2 (two) times daily as needed for muscle spasms. 11/17/23  Yes Burnette Carte, MD  albuterol  (PROVENTIL  HFA;VENTOLIN  HFA) 108 (90 Base) MCG/ACT inhaler Inhale 1-2 puffs into the lungs every 6 (six) hours as needed for wheezing or shortness of breath. 04/09/17   Tonya Fredrickson, PA-C  naproxen sodium (ALEVE) 220 MG tablet Take 440 mg by mouth every 4 (four) hours as needed (for pain).    [provider]      Allergies    Shrimp [shellfish allergy]    Review of Systems   Review of Systems  All other systems reviewed and are negative.   Physical Exam Updated Vital Signs BP 126/79 (BP Location: Right Arm)   Pulse 92   Temp 97.7 F (36.5 C) (Oral)   Resp 18   Wt 65.8 kg   SpO2 98%   BMI 23.40 kg/m  Physical Exam Vitals and nursing note reviewed.  Constitutional:      General: He is not in acute distress.    Appearance: Normal appearance. He is well-developed.  HENT:     Head: Normocephalic and atraumatic.  Eyes:     Conjunctiva/sclera: Conjunctivae normal.     Pupils: Pupils are equal, round, and reactive to light.  Cardiovascular:     Rate and Rhythm:  Normal rate and regular rhythm.     Heart sounds: Normal heart sounds.  Pulmonary:     Effort: Pulmonary effort is normal. No respiratory distress.     Breath sounds: Normal breath sounds.  Abdominal:     General: There is no distension.     Palpations: Abdomen is soft.     Tenderness: There is no abdominal tenderness.  Musculoskeletal:        General: No deformity. Normal range of motion.     Cervical back: Normal range of motion and neck supple.     Comments: Mild paraspinal tenderness in the lumbar back.  No midline tenderness.  No bony step-off.  No erythema or rash.  Patient is ambulatory.  5 out of 5 strength in both lower extremities.  No sensory deficit in the lower extremities.  Skin:    General: Skin is warm and dry.  Neurological:     General: No focal deficit present.     Mental Status: He is alert and oriented to person, place, and time.     ED Results / Procedures / Treatments   Labs (all labs ordered are listed, but only abnormal results are displayed) Labs Reviewed - No data to display  EKG None  Radiology No results found.  Procedures Procedures    Medications Ordered in ED Medications  ketorolac (TORADOL) 15 MG/ML injection 15 mg (has no administration in time range)    ED Course/ Medical Decision Making/ A&P                                 Medical Decision Making Risk Prescription drug management.    Medical Screen Complete  This patient presented to the ED with complaint of low back pain.  This complaint involves an extensive number of treatment options. The initial differential diagnosis includes, but is not limited to, muscular low back pain  This presentation is: Acute, Self-Limited, Previously Undiagnosed, and Uncertain Prognosis  Patient is presenting with constellation of symptoms entirely consistent with muscular low back pain.  Patient reassured by evaluation.  He feels improved after Toradol.    Importance of close  follow-up stressed.  Strict return precautions given and understood.  Additional history obtained:  External records from outside sources obtained and reviewed including prior ED visits and prior Inpatient records.    Problem List / ED Course:  Low back pain   Reevaluation:  After the interventions noted above, I reevaluated the patient and found that they have: improved  Disposition:  After consideration of the diagnostic results and the patients response to treatment, I feel that the patent would benefit from close outpatient follow-up.          Final Clinical Impression(s) / ED Diagnoses Final diagnoses:  Acute bilateral low back pain without sciatica    Rx / DC Orders ED Discharge Orders          Ordered    cyclobenzaprine (FLEXERIL) 10 MG tablet  2 times daily PRN        11/17/23 1041              Burnette Carte, MD 11/17/23 1043

## 2023-11-17 NOTE — ED Triage Notes (Signed)
 BIB [facility] from grocery store for low back pain, onset yesterday, h/o same, no known injury, worse with movement and straining, improved with rest. No pain at this time while sitting. VSS. CBG 110.Denies sx other than back pain. No radiation. Alert, NAD, calm, interactive.

## 2023-11-17 NOTE — Discharge Instructions (Addendum)
 Return for any problem.   Use tylenol , ibuprofen , and prescribed muscle relaxant for pain.

## 2023-11-23 ENCOUNTER — Emergency Department (HOSPITAL_COMMUNITY)

## 2023-11-23 ENCOUNTER — Inpatient Hospital Stay (HOSPITAL_COMMUNITY)
Admission: EM | Admit: 2023-11-23 | Discharge: 2023-11-25 | DRG: 286 | Disposition: A | Discharge status: Home / Self Care | Attending: Family Medicine | Admitting: Family Medicine

## 2023-11-23 ENCOUNTER — Inpatient Hospital Stay (HOSPITAL_COMMUNITY)

## 2023-11-23 ENCOUNTER — Encounter (HOSPITAL_COMMUNITY): Payer: Self-pay

## 2023-11-23 ENCOUNTER — Other Ambulatory Visit: Payer: Self-pay

## 2023-11-23 DIAGNOSIS — I5021 Acute systolic (congestive) heart failure: Secondary | ICD-10-CM | POA: Diagnosis present

## 2023-11-23 DIAGNOSIS — J810 Acute pulmonary edema: Secondary | ICD-10-CM | POA: Diagnosis not present

## 2023-11-23 DIAGNOSIS — I447 Left bundle-branch block, unspecified: Secondary | ICD-10-CM | POA: Diagnosis present

## 2023-11-23 DIAGNOSIS — R0609 Other forms of dyspnea: Secondary | ICD-10-CM

## 2023-11-23 DIAGNOSIS — R0602 Shortness of breath: Secondary | ICD-10-CM | POA: Diagnosis present

## 2023-11-23 DIAGNOSIS — D72829 Elevated white blood cell count, unspecified: Secondary | ICD-10-CM | POA: Insufficient documentation

## 2023-11-23 DIAGNOSIS — I10 Essential (primary) hypertension: Secondary | ICD-10-CM

## 2023-11-23 DIAGNOSIS — Z91013 Allergy to seafood: Secondary | ICD-10-CM | POA: Diagnosis not present

## 2023-11-23 DIAGNOSIS — E611 Iron deficiency: Secondary | ICD-10-CM | POA: Diagnosis present

## 2023-11-23 DIAGNOSIS — F1090 Alcohol use, unspecified, uncomplicated: Secondary | ICD-10-CM | POA: Diagnosis not present

## 2023-11-23 DIAGNOSIS — Z7984 Long term (current) use of oral hypoglycemic drugs: Secondary | ICD-10-CM

## 2023-11-23 DIAGNOSIS — Z720 Tobacco use: Secondary | ICD-10-CM

## 2023-11-23 DIAGNOSIS — F1010 Alcohol abuse, uncomplicated: Secondary | ICD-10-CM | POA: Diagnosis present

## 2023-11-23 DIAGNOSIS — I509 Heart failure, unspecified: Secondary | ICD-10-CM | POA: Diagnosis not present

## 2023-11-23 DIAGNOSIS — I2489 Other forms of acute ischemic heart disease: Secondary | ICD-10-CM | POA: Diagnosis present

## 2023-11-23 DIAGNOSIS — Z79899 Other long term (current) drug therapy: Secondary | ICD-10-CM | POA: Diagnosis not present

## 2023-11-23 DIAGNOSIS — J4520 Mild intermittent asthma, uncomplicated: Secondary | ICD-10-CM | POA: Diagnosis present

## 2023-11-23 DIAGNOSIS — I110 Hypertensive heart disease with heart failure: Secondary | ICD-10-CM | POA: Diagnosis present

## 2023-11-23 DIAGNOSIS — M25511 Pain in right shoulder: Secondary | ICD-10-CM | POA: Diagnosis present

## 2023-11-23 DIAGNOSIS — F17210 Nicotine dependence, cigarettes, uncomplicated: Secondary | ICD-10-CM | POA: Diagnosis present

## 2023-11-23 DIAGNOSIS — I428 Other cardiomyopathies: Secondary | ICD-10-CM | POA: Diagnosis present

## 2023-11-23 DIAGNOSIS — I2510 Atherosclerotic heart disease of native coronary artery without angina pectoris: Secondary | ICD-10-CM | POA: Diagnosis present

## 2023-11-23 DIAGNOSIS — R7989 Other specified abnormal findings of blood chemistry: Secondary | ICD-10-CM | POA: Insufficient documentation

## 2023-11-23 LAB — CBC WITH DIFFERENTIAL/PLATELET
Abs Immature Granulocytes: 0.06 K/uL (ref 0.00–0.07)
Basophils Absolute: 0.1 K/uL (ref 0.0–0.1)
Basophils Relative: 0 %
Eosinophils Absolute: 0.2 K/uL (ref 0.0–0.5)
Eosinophils Relative: 1 %
HCT: 41.6 % (ref 39.0–52.0)
Hemoglobin: 13.5 g/dL (ref 13.0–17.0)
Immature Granulocytes: 0 %
Lymphocytes Relative: 23 %
Lymphs Abs: 3.1 K/uL (ref 0.7–4.0)
MCH: 23.8 pg — ABNORMAL LOW (ref 26.0–34.0)
MCHC: 32.5 g/dL (ref 30.0–36.0)
MCV: 73.4 fL — ABNORMAL LOW (ref 80.0–100.0)
Monocytes Absolute: 0.8 K/uL (ref 0.1–1.0)
Monocytes Relative: 6 %
Neutro Abs: 9.4 K/uL — ABNORMAL HIGH (ref 1.7–7.7)
Neutrophils Relative %: 70 %
Platelets: 252 K/uL (ref 150–400)
RBC: 5.67 MIL/uL (ref 4.22–5.81)
RDW: 15.9 % — ABNORMAL HIGH (ref 11.5–15.5)
WBC: 13.7 K/uL — ABNORMAL HIGH (ref 4.0–10.5)
nRBC: 0 % (ref 0.0–0.2)

## 2023-11-23 LAB — ECHOCARDIOGRAM COMPLETE
AR max vel: 1.75 cm2
AV Area VTI: 1.79 cm2
AV Area mean vel: 1.83 cm2
AV Mean grad: 2 mmHg
AV Peak grad: 3.8 mmHg
Ao pk vel: 0.97 m/s
Area-P 1/2: 5.09 cm2
Calc EF: 21.5 %
Est EF: <20
Height: 65 in
S' Lateral: 5.1 cm
Single Plane A2C EF: 22 %
Single Plane A4C EF: 19.1 %
Weight: 2080 [oz_av]

## 2023-11-23 LAB — HEMOGLOBIN A1C
Hgb A1c MFr Bld: 4.5 % — ABNORMAL LOW (ref 4.8–5.6)
Mean Plasma Glucose: 82.45 mg/dL

## 2023-11-23 LAB — BASIC METABOLIC PANEL WITH GFR
Anion gap: 14 (ref 5–15)
BUN: 9 mg/dL (ref 6–20)
CO2: 23 mmol/L (ref 22–32)
Calcium: 9.5 mg/dL (ref 8.9–10.3)
Chloride: 103 mmol/L (ref 98–111)
Creatinine, Ser: 1.31 mg/dL — ABNORMAL HIGH (ref 0.61–1.24)
GFR, Estimated: >60 mL/min (ref >60)
Glucose, Bld: 116 mg/dL — ABNORMAL HIGH (ref 70–99)
Potassium: 4.3 mmol/L (ref 3.5–5.1)
Sodium: 140 mmol/L (ref 135–145)

## 2023-11-23 LAB — TROPONIN I (HIGH SENSITIVITY)
Troponin I (High Sensitivity): 45 ng/L — ABNORMAL HIGH (ref <18)
Troponin I (High Sensitivity): 55 ng/L — ABNORMAL HIGH (ref <18)

## 2023-11-23 LAB — RAPID URINE DRUG SCREEN, HOSP PERFORMED
Amphetamines: NOT DETECTED
Barbiturates: NOT DETECTED
Benzodiazepines: NOT DETECTED
Cocaine: NOT DETECTED
Opiates: NOT DETECTED
Tetrahydrocannabinol: NOT DETECTED

## 2023-11-23 LAB — COMPREHENSIVE METABOLIC PANEL WITH GFR
ALT: 15 U/L (ref 0–44)
AST: 20 U/L (ref 15–41)
Albumin: 3.3 g/dL — ABNORMAL LOW (ref 3.5–5.0)
Alkaline Phosphatase: 58 U/L (ref 38–126)
Anion gap: 8 (ref 5–15)
BUN: 9 mg/dL (ref 6–20)
CO2: 22 mmol/L (ref 22–32)
Calcium: 9.2 mg/dL (ref 8.9–10.3)
Chloride: 109 mmol/L (ref 98–111)
Creatinine, Ser: 1.08 mg/dL (ref 0.61–1.24)
GFR, Estimated: >60 mL/min (ref >60)
Glucose, Bld: 104 mg/dL — ABNORMAL HIGH (ref 70–99)
Potassium: 4.2 mmol/L (ref 3.5–5.1)
Sodium: 139 mmol/L (ref 135–145)
Total Bilirubin: 0.7 mg/dL (ref 0.0–1.2)
Total Protein: 7 g/dL (ref 6.5–8.1)

## 2023-11-23 LAB — IRON AND TIBC
Iron: 33 ug/dL — ABNORMAL LOW (ref 45–182)
Saturation Ratios: 8 % — ABNORMAL LOW (ref 17.9–39.5)
TIBC: 407 ug/dL (ref 250–450)
UIBC: 374 ug/dL

## 2023-11-23 LAB — BRAIN NATRIURETIC PEPTIDE: B Natriuretic Peptide: 2481.6 pg/mL — ABNORMAL HIGH (ref 0.0–100.0)

## 2023-11-23 LAB — LIPASE, BLOOD: Lipase: 30 U/L (ref 11–51)

## 2023-11-23 LAB — RESP PANEL BY RT-PCR (RSV, FLU A&B, COVID)  RVPGX2
Influenza A by PCR: NEGATIVE
Influenza B by PCR: NEGATIVE
Resp Syncytial Virus by PCR: NEGATIVE
SARS Coronavirus 2 by RT PCR: NEGATIVE

## 2023-11-23 LAB — FERRITIN: Ferritin: 62 ng/mL (ref 24–336)

## 2023-11-23 MED ORDER — FUROSEMIDE 10 MG/ML IJ SOLN
40.0000 mg | Freq: Once | INTRAMUSCULAR | Status: AC
  Administered 2023-11-23 (×2): 40 mg via INTRAVENOUS
  Filled 2023-11-23: qty 4

## 2023-11-23 MED ORDER — PERFLUTREN LIPID MICROSPHERE
1.0000 mL | INTRAVENOUS | Status: AC | PRN
  Administered 2023-11-23 (×2): 4 mL via INTRAVENOUS

## 2023-11-23 MED ORDER — SODIUM CHLORIDE 0.9 % IV BOLUS: 500.0000 mL | Freq: Once | INTRAVENOUS | Status: DC

## 2023-11-23 MED ORDER — ASPIRIN 81 MG PO CHEW
81.0000 mg | CHEWABLE_TABLET | ORAL | Status: AC
  Administered 2023-11-24 (×2): 81 mg via ORAL
  Filled 2023-11-23: qty 1

## 2023-11-23 MED ORDER — LOSARTAN POTASSIUM 25 MG PO TABS
12.5000 mg | ORAL_TABLET | Freq: Every day | ORAL | Status: DC
  Administered 2023-11-23 – 2023-11-24 (×4): 12.5 mg via ORAL
  Filled 2023-11-23 (×2): qty 1

## 2023-11-23 MED ORDER — IOHEXOL 350 MG/ML SOLN
75.0000 mL | Freq: Once | INTRAVENOUS | Status: AC | PRN
  Administered 2023-11-23 (×2): 75 mL via INTRAVENOUS

## 2023-11-23 MED ORDER — DIGOXIN 125 MCG PO TABS
0.1250 mg | ORAL_TABLET | Freq: Every day | ORAL | Status: DC
  Administered 2023-11-23 – 2023-11-25 (×6): 0.125 mg via ORAL
  Filled 2023-11-23 (×3): qty 1

## 2023-11-23 MED ORDER — FUROSEMIDE 10 MG/ML IJ SOLN
20.0000 mg | Freq: Once | INTRAMUSCULAR | Status: AC
  Administered 2023-11-23 (×2): 20 mg via INTRAVENOUS
  Filled 2023-11-23: qty 2

## 2023-11-23 MED ORDER — IPRATROPIUM-ALBUTEROL 0.5-2.5 (3) MG/3ML IN SOLN
3.0000 mL | Freq: Once | RESPIRATORY_TRACT | Status: AC
  Administered 2023-11-23 (×2): 3 mL via RESPIRATORY_TRACT
  Filled 2023-11-23: qty 3

## 2023-11-23 MED ORDER — SPIRONOLACTONE 12.5 MG HALF TABLET
12.5000 mg | ORAL_TABLET | Freq: Every day | ORAL | Status: DC
  Administered 2023-11-23 – 2023-11-25 (×6): 12.5 mg via ORAL
  Filled 2023-11-23 (×3): qty 1

## 2023-11-23 MED ORDER — SODIUM CHLORIDE 0.9 % IV SOLN: INTRAVENOUS | Status: DC

## 2023-11-23 MED ORDER — ENOXAPARIN SODIUM 40 MG/0.4ML IJ SOSY
40.0000 mg | PREFILLED_SYRINGE | INTRAMUSCULAR | Status: DC
  Administered 2023-11-23 (×2): 40 mg via SUBCUTANEOUS
  Filled 2023-11-23: qty 0.4

## 2023-11-23 NOTE — H&P (View-Only) (Signed)
 Advanced Heart Failure Team Consult Note   Primary Physician: Pcp, No Cardiologist:  None  Reason for Consultation: Acute Systolic Heart Failure   HPI:    Jesse Key is seen today for evaluation of acute systolic heart failure at the request of Dr. Alverna John, Internal Medicine.   58 y/o AA male w/ no prior cardiac history. No routine medical care. Was told once before that he had high blood pressure when trying to donate plasma. He said he was told his BP was in the 200s but he did not seek any medical care. Active smoker, smoking since age 8, 1/2 ppd. Drinks daily, 2-3 16 oz beers/day. Denies any other substance use. No known family h/o heart disease or SCD.   Says he was in his USOH until last Saturday. Went to a local UC for low back and rt shoulder pain. Says he was given a shoulder injection and sent home w/ Rx for a muscle relaxer. He had no cardiac symptoms/dyspnea at that time. He reports the muscle relaxer's made him extremely thirsty and he increased his fluid intake and developed SOB after that. Became acutely SOB yesterday w/ exertional dyspnea and orthopnea. Also had substernal chest tightness but no h/o CP prior to this.   In the ED, he was found to be in acute CHF. BNP 2,481. CXR showed cardiomegaly w/ central pulmonary vascular congestion and minimal bibasilar pulmonary edema. CT of chest also suggestive of mild pulmonary edema and small b/l pleural effusions. No PE. EKG showed sinus tach 104 bpm w/ BAE and atypical LBBB, QRS 154 ms. No prior EKGs for comparison. Hs trop 45>>55. BPs 140s/90s. WBC 13.7, Hgb 13.5, low MCV 73.4. Na 139, K 4.2, CO2 22, Scr 1.08, HFTs nl. Respiratory panel negative.   He was given 20 mg IV Lasix  w/ reported good UOP and subjective improvement in breathing. He remains mildly volume overloaded on exam. No current CP.   Echo done today shows severely reduced LVEF, < 20%, RV nl. LVOT VTI of 7.85 with SV of 25cc concerning for low output  heart  failure   Echo 11/23/23  IMPRESSIONS     1. Left ventricular ejection fraction, by estimation, is <20%. The left  ventricle has normal function. The left ventricle demonstrates global  hypokinesis. The left ventricular internal cavity size was mildly dilated.  Indeterminate diastolic filling due  to E-A fusion. LVOT VTI of 7.85 with SV of 25cc concerning for low output  heart failure.   2. Right ventricular systolic function is normal. The right ventricular  size is normal.   3. Left atrial size was moderately dilated.   4. Right atrial size was mildly dilated.   5. The mitral valve is normal in structure. Mild to moderate mitral valve  regurgitation.   6. The aortic valve is normal in structure. Aortic valve regurgitation is  not visualized.   7. The inferior vena cava is normal in size with greater than 50%  respiratory variability, suggesting right atrial pressure of 3 mmHg.   Home Medications Prior to Admission medications   Medication Sig Start Date End Date Taking? Authorizing Provider  albuterol  (VENTOLIN  HFA) 108 (90 Base) MCG/ACT inhaler Inhale 2 puffs into the lungs every 6 (six) hours as needed for wheezing or shortness of breath.   Yes [provider]  cyclobenzaprine (FLEXERIL) 10 MG tablet Take 10 mg by mouth 2 (two) times daily as needed. 11/18/23  Yes [provider]    Past Medical History:  History reviewed. No pertinent past medical history.  Past Surgical History: No past surgical history   Family History: History reviewed. No pertinent family history.  Social History: Social History   Socioeconomic History   Marital status: Single    Spouse name: Not on file   Number of children: Not on file   Years of education: Not on file   Highest education level: Not on file  Occupational History   Not on file  Tobacco Use   Smoking status: Not on file   Smokeless tobacco: Not on file  Substance and Sexual Activity   Alcohol use: Not on  file   Drug use: Not on file   Sexual activity: Not on file  Other Topics Concern   Not on file  Social History Narrative   Not on file   Social Drivers of Health   Financial Resource Strain: Not on file  Food Insecurity: Not on file  Transportation Needs: Not on file  Physical Activity: Not on file  Stress: Not on file  Social Connections: Not on file    Allergies:  Allergies  Allergen Reactions   Shellfish Allergy Itching    Objective:    Vital Signs:   Temp:  [97.6 F (36.4 C)-98.4 F (36.9 C)] 98.3 F (36.8 C) (05/22 1559) Pulse Rate:  [102-107] 103 (05/22 1600) Resp:  [20-36] 22 (05/22 1600) BP: (123-148)/(89-97) 130/96 (05/22 1600) SpO2:  [96 %-100 %] 96 % (05/22 1600) Weight:  [59 kg] 59 kg (05/22 0708)    Weight change: Filed Weights   11/23/23 0708  Weight: 59 kg    Intake/Output:   Intake/Output Summary (Last 24 hours) at 11/23/2023 1625 Last data filed at 11/23/2023 1444 Gross per 24 hour  Intake --  Output 1150 ml  Net -1150 ml      Physical Exam    General:  Well appearing, thin. No resp difficulty HEENT: normal Neck: supple. JVP 12 cm . Carotids 2+ bilat; no bruits. No lymphadenopathy or thyromegaly appreciated. Cor: PMI nondisplaced. Regular rate & rhythm.  Lungs: decreased BS at the bases bilaterally  Abdomen: soft, nontender, +distended.  Extremities: no cyanosis, clubbing, rash, edema Neuro: alert & orientedx3, cranial nerves grossly intact. moves all 4 extremities w/o difficulty. Affect pleasant   Telemetry   Sinus tach low 100s, personally reviewed   EKG    Sinus tach 104 bpm w/ atypical LBBB, QRS 154 ms, BAE   Labs   Basic Metabolic Panel: Recent Labs  Lab 11/23/23 0715  NA 139  K 4.2  CL 109  CO2 22  GLUCOSE 104*  BUN 9  CREATININE 1.08  CALCIUM  9.2    Liver Function Tests: Recent Labs  Lab 11/23/23 0715  AST 20  ALT 15  ALKPHOS 58  BILITOT 0.7  PROT 7.0  ALBUMIN 3.3*   Recent Labs  Lab  11/23/23 0715  LIPASE 30   No results for input(s): "AMMONIA" in the last 168 hours.  CBC: Recent Labs  Lab 11/23/23 0715  WBC 13.7*  NEUTROABS 9.4*  HGB 13.5  HCT 41.6  MCV 73.4*  PLT 252    Cardiac Enzymes: No results for input(s): "CKTOTAL", "CKMB", "CKMBINDEX", "TROPONINI" in the last 168 hours.  BNP: BNP (last 3 results) Recent Labs    11/23/23 0829  BNP 2,481.6*    ProBNP (last 3 results) No results for input(s): "PROBNP" in the last 8760 hours.   CBG: No results for input(s): "GLUCAP" in the last 168 hours.  Coagulation  Studies: No results for input(s): "LABPROT", "INR" in the last 72 hours.   Imaging   ECHOCARDIOGRAM COMPLETE Result Date: 11/23/2023    ECHOCARDIOGRAM REPORT   Patient Name:   Jesse Key Date of Exam: 11/23/2023 Medical Rec #:  [id number]      Height:       65.0 in Accession #:    [id number]     Weight:       130.0 lb Date of Birth:  03-Aug-1965       BSA:          1.647 m Patient Age:    58 years       BP:           142/90 mmHg Patient Gender: M              HR:           105 bpm. Exam Location:  Inpatient Procedure: 2D Echo, Cardiac Doppler, Color Doppler and Intracardiac            Opacification Agent (Both Spectral and Color Flow Doppler were            utilized during procedure). Indications:    Dyspnea  History:        Patient has no prior history of Echocardiogram examinations.  Sonographer:    Sulema Endo RDCS, FE, PE Referring Phys: [id number] Jarome Merritt RUMBALL IMPRESSIONS  1. Left ventricular ejection fraction, by estimation, is <20%. The left ventricle has normal function. The left ventricle demonstrates global hypokinesis. The left ventricular internal cavity size was mildly dilated. Indeterminate diastolic filling due to E-A fusion. LVOT VTI of 7.85 with SV of 25cc concerning for low output heart failure.  2. Right ventricular systolic function is normal. The right ventricular size is normal.  3. Left atrial size was moderately dilated.  4.  Right atrial size was mildly dilated.  5. The mitral valve is normal in structure. Mild to moderate mitral valve regurgitation.  6. The aortic valve is normal in structure. Aortic valve regurgitation is not visualized.  7. The inferior vena cava is normal in size with greater than 50% respiratory variability, suggesting right atrial pressure of 3 mmHg. FINDINGS  Left Ventricle: Left ventricular ejection fraction, by estimation, is <20%. The left ventricle has normal function. The left ventricle demonstrates global hypokinesis. Definity  contrast agent was given IV to delineate the left ventricular endocardial borders. The left ventricular internal cavity size was mildly dilated. There is no left ventricular hypertrophy. Indeterminate diastolic filling due to E-A fusion. Right Ventricle: The right ventricular size is normal. No increase in right ventricular wall thickness. Right ventricular systolic function is normal. Left Atrium: Left atrial size was moderately dilated. Right Atrium: Right atrial size was mildly dilated. Pericardium: Trivial pericardial effusion is present. Mitral Valve: The mitral valve is normal in structure. Mild to moderate mitral valve regurgitation. Tricuspid Valve: The tricuspid valve is normal in structure. Tricuspid valve regurgitation is trivial. Aortic Valve: The aortic valve is normal in structure. Aortic valve regurgitation is not visualized. Aortic valve mean gradient measures 2.0 mmHg. Aortic valve peak gradient measures 3.8 mmHg. Aortic valve area, by VTI measures 1.79 cm. Pulmonic Valve: The pulmonic valve was normal in structure. Pulmonic valve regurgitation is not visualized. Aorta: The aortic root is normal in size and structure. Venous: The inferior vena cava is normal in size with greater than 50% respiratory variability, suggesting right atrial pressure of 3 mmHg. IAS/Shunts: No atrial level shunt detected by color  flow Doppler.  LEFT VENTRICLE PLAX 2D LVIDd:         5.70 cm       Diastology LVIDs:         5.10 cm      LV e' medial:    6.74 cm/s LV PW:         1.00 cm      LV E/e' medial:  13.9 LV IVS:        0.80 cm      LV e' lateral:   8.27 cm/s LVOT diam:     2.00 cm      LV E/e' lateral: 11.3 LV SV:         25 LV SV Index:   15 LVOT Area:     3.14 cm  LV Volumes (MOD) LV vol d, MOD A2C: 159.0 ml LV vol d, MOD A4C: 209.0 ml LV vol s, MOD A2C: 124.0 ml LV vol s, MOD A4C: 169.0 ml LV SV MOD A2C:     35.0 ml LV SV MOD A4C:     209.0 ml LV SV MOD BP:      39.7 ml RIGHT VENTRICLE RV Basal diam:  4.40 cm RV Mid diam:    2.90 cm RV S prime:     9.25 cm/s TAPSE (M-mode): 1.6 cm LEFT ATRIUM             Index        RIGHT ATRIUM           Index LA diam:        4.20 cm 2.55 cm/m   RA Area:     19.00 cm LA Vol (A2C):   75.5 ml 45.84 ml/m  RA Volume:   55.30 ml  33.57 ml/m LA Vol (A4C):   72.2 ml 43.83 ml/m LA Biplane Vol: 74.2 ml 45.05 ml/m  AORTIC VALVE AV Area (Vmax):    1.75 cm AV Area (Vmean):   1.83 cm AV Area (VTI):     1.79 cm AV Vmax:           97.40 cm/s AV Vmean:          62.500 cm/s AV VTI:            0.138 m AV Peak Grad:      3.8 mmHg AV Mean Grad:      2.0 mmHg LVOT Vmax:         54.20 cm/s LVOT Vmean:        36.400 cm/s LVOT VTI:          0.078 m LVOT/AV VTI ratio: 0.57  AORTA Ao Root diam: 3.20 cm Ao Asc diam:  3.10 cm MITRAL VALVE               TRICUSPID VALVE MV Area (PHT): 5.09 cm    TR Peak grad:   43.8 mmHg MV Decel Time: 149 msec    TR Vmax:        331.00 cm/s MV E velocity: 93.40 cm/s                            SHUNTS                            Systemic VTI:  0.08 m  Systemic Diam: 2.00 cm Aditya Sabharwal Electronically signed by Alwin Baars Signature Date/Time: 11/23/2023/3:55:03 PM    Final    CT Angio Chest Pulmonary Embolism (PE) W or WO Contrast Result Date: 11/23/2023 CLINICAL DATA:  Dyspnea, chronic, chest wall or pleura disease suspected EXAM: CT ANGIOGRAPHY CHEST WITH CONTRAST TECHNIQUE: Multidetector CT imaging of the  chest was performed using the standard protocol during bolus administration of intravenous contrast. Multiplanar CT image reconstructions and MIPs were obtained to evaluate the vascular anatomy. RADIATION DOSE REDUCTION: This exam was performed according to the departmental dose-optimization program which includes automated exposure control, adjustment of the mA and/or kV according to patient size and/or use of iterative reconstruction technique. CONTRAST:  75mL OMNIPAQUE  IOHEXOL  350 MG/ML SOLN COMPARISON:  Nov 23, 2023 FINDINGS: Pulmonary Embolism: No pulmonary embolism. Cardiovascular: Mild cardiomegaly. Trace pericardial effusion. No aortic aneurysm. Scattered atherosclerosis in the descending aorta Mediastinum/Nodes: No mediastinal mass. No mediastinal, hilar, or axillary lymphadenopathy. Lungs/Pleura: The midline trachea and bronchi are patent. Biapical pleuroparenchymal scarring. Mild interlobular septal thickening within both lower lobes with hazy dependent ground-glass airspace opacities. Small bilateral pleural effusions. No pneumothorax. A small amount of fluid is also present within both oblique fissures, more so on the left than the right. Musculoskeletal: No acute fracture or destructive bone lesion. Upper Abdomen: Likely small cyst in the upper pole of the left kidney. Reflux of contrast into the hepatic veins, suggesting underlying cardiac dysfunction. Review of the MIP images confirms the above findings. IMPRESSION: 1. No pulmonary embolism. 2. Mild cardiomegaly. Pulmonary findings suggestive of mild pulmonary edema with small bilateral pleural effusions. Aortic Atherosclerosis (ICD10-I70.0). Electronically Signed   By: Rance Burrows M.D.   On: 11/23/2023 15:17   DG Chest 2 View Result Date: 11/23/2023 CLINICAL DATA:  Shortness of breath. EXAM: CHEST - 2 VIEW COMPARISON:  April 09, 2017. FINDINGS: Mild cardiomegaly is noted with central pulmonary vascular congestion. Minimal bibasilar pulmonary  edema is noted. Bony thorax is unremarkable. IMPRESSION: Mild cardiomegaly with central pulmonary vascular congestion. Minimal bibasilar pulmonary edema. Electronically Signed   By: Rosalene Colon M.D.   On: 11/23/2023 07:49     Medications:     Current Medications:  enoxaparin  (LOVENOX ) injection  40 mg Subcutaneous Q24H    Infusions:     Patient Profile   58 y/o male smoker w/ h/o untreated HTN, ETOH use and no routine medical care presented w/ new systolic heart failure.   Assessment/Plan   1. Acute Systolic Heart Failure - new diagnosis - Echo EF < 20%, RV nl, global HK, mid-mod MR. LVOT VTI of 7.85 with SV of 25cc concerning for low output  heart failure  - NYHA Class IIIb. Good response thus far to IV Lasix   but remains mildly volume overload. Give another dose of IV Lasix  this evening, increase to 40 mg  - Start losartan  12.5 mg daily  - Start Spironolactone  12.5 mg daily - Start digoxin  0.125 mg  - Eventual SGLT2i prior to d/c  - Etiology uncertain. Hs trops not c/w ACS but will need R/LHC after diuresis. Has multiple risk factors for IHD. Plan cath for tomorrow - cMRI if cath unremarkable  - check HIV and UDS - reduction of EOTH imperative - check iron  stores given low MCV. If IV Fe indicated, please wait until after completion of cMRI    2. HTN - known history though untreated - start HF GDMT per above  - reports h/o snoring, recommend outpatient sleep study   3.  ETOH/Tobacco Use - drinks 2-3 16 oz beers/day, smokes 1/2 ppd - cessation advised    Length of Stay: 0  Ruddy Corral, PA-C  11/23/2023, 4:25 PM    Advanced Heart Failure Team Pager [phone] (M-F; 7a - 5p)  Please contact [facility] for night-coverage after hours (4p -7a ) and weekends on [email or web address]  Patient seen and examined with the above-signed Advanced Practice Provider and/or Housestaff. I personally reviewed laboratory data, imaging studies and relevant notes. I independently  examined the patient and formulated the important aspects of the plan. I have edited the note to reflect any of my changes or salient points. I have personally discussed the plan with the patient and/or family.  58 y/o male with undiagnosed HTN, ETOH/tob abuse presents with new onset HF.   Denies any h/o heart disease. Developed HF symptoms over the last week.   In ER EF ~20% with dyssynchrony. RV ok  ECG LBBB  Hstrop negative  Scr 1.08   General:  Sitting up in bed. No resp difficulty HEENT: normal Neck: supple. JVP 10 Carotids 2+ bilat; no bruits. No lymphadenopathy or thryomegaly appreciated. Cor: PMI nondisplaced. Regular rate & rhythm. + summation gallop Lungs: clear Abdomen: soft, nontender, nondistended. No hepatosplenomegaly. No bruits or masses. Good bowel sounds. Extremities: no cyanosis, clubbing, rash,1+  edema Neuro: alert & orientedx3, cranial nerves grossly intact. moves all 4 extremities w/o difficulty. Affect pleasant  He has new onset systolic HF. Based on his echo, I suspect he has LBBB CM but has multiple CRFs. ETOH CM less likely with normal RV  Plan: 1. Diurese 2. Start GDMT 3. R/L cath +/- cMRI tomorrow 4. Discussed ETOH/tob cessation 5. Check iron  stores (MCV is low)  Jules Oar, MD  5:31 PM

## 2023-11-23 NOTE — ED Triage Notes (Signed)
 PT BIB [facility] from hotel where PT experienced new onset SOB this AM before breakfast. Hx of asthma, PT states they did not have inhaler on hand. Also endorses URI s/s and cough x3 days. [facility] Vitals: 156/90, HR 100, 99% O2 on RA, Aox4.

## 2023-11-23 NOTE — Consult Note (Addendum)
 Advanced Heart Failure Team Consult Note   Primary Physician: Pcp, No Cardiologist:  None  Reason for Consultation: Acute Systolic Heart Failure   HPI:    Jesse Key is seen today for evaluation of acute systolic heart failure at the request of Dr. Alverna John, Internal Medicine.   58 y/o AA male w/ no prior cardiac history. No routine medical care. Was told once before that he had high blood pressure when trying to donate plasma. He said he was told his BP was in the 200s but he did not seek any medical care. Active smoker, smoking since age 8, 1/2 ppd. Drinks daily, 2-3 16 oz beers/day. Denies any other substance use. No known family h/o heart disease or SCD.   Says he was in his USOH until last Saturday. Went to a local UC for low back and rt shoulder pain. Says he was given a shoulder injection and sent home w/ Rx for a muscle relaxer. He had no cardiac symptoms/dyspnea at that time. He reports the muscle relaxer's made him extremely thirsty and he increased his fluid intake and developed SOB after that. Became acutely SOB yesterday w/ exertional dyspnea and orthopnea. Also had substernal chest tightness but no h/o CP prior to this.   In the ED, he was found to be in acute CHF. BNP 2,481. CXR showed cardiomegaly w/ central pulmonary vascular congestion and minimal bibasilar pulmonary edema. CT of chest also suggestive of mild pulmonary edema and small b/l pleural effusions. No PE. EKG showed sinus tach 104 bpm w/ BAE and atypical LBBB, QRS 154 ms. No prior EKGs for comparison. Hs trop 45>>55. BPs 140s/90s. WBC 13.7, Hgb 13.5, low MCV 73.4. Na 139, K 4.2, CO2 22, Scr 1.08, HFTs nl. Respiratory panel negative.   He was given 20 mg IV Lasix  w/ reported good UOP and subjective improvement in breathing. He remains mildly volume overloaded on exam. No current CP.   Echo done today shows severely reduced LVEF, < 20%, RV nl. LVOT VTI of 7.85 with SV of 25cc concerning for low output  heart  failure   Echo 11/23/23  IMPRESSIONS     1. Left ventricular ejection fraction, by estimation, is <20%. The left  ventricle has normal function. The left ventricle demonstrates global  hypokinesis. The left ventricular internal cavity size was mildly dilated.  Indeterminate diastolic filling due  to E-A fusion. LVOT VTI of 7.85 with SV of 25cc concerning for low output  heart failure.   2. Right ventricular systolic function is normal. The right ventricular  size is normal.   3. Left atrial size was moderately dilated.   4. Right atrial size was mildly dilated.   5. The mitral valve is normal in structure. Mild to moderate mitral valve  regurgitation.   6. The aortic valve is normal in structure. Aortic valve regurgitation is  not visualized.   7. The inferior vena cava is normal in size with greater than 50%  respiratory variability, suggesting right atrial pressure of 3 mmHg.   Home Medications Prior to Admission medications   Medication Sig Start Date End Date Taking? Authorizing Provider  albuterol  (VENTOLIN  HFA) 108 (90 Base) MCG/ACT inhaler Inhale 2 puffs into the lungs every 6 (six) hours as needed for wheezing or shortness of breath.   Yes [provider]  cyclobenzaprine (FLEXERIL) 10 MG tablet Take 10 mg by mouth 2 (two) times daily as needed. 11/18/23  Yes [provider]    Past Medical History:  History reviewed. No pertinent past medical history.  Past Surgical History: No past surgical history   Family History: History reviewed. No pertinent family history.  Social History: Social History   Socioeconomic History   Marital status: Single    Spouse name: Not on file   Number of children: Not on file   Years of education: Not on file   Highest education level: Not on file  Occupational History   Not on file  Tobacco Use   Smoking status: Not on file   Smokeless tobacco: Not on file  Substance and Sexual Activity   Alcohol use: Not on  file   Drug use: Not on file   Sexual activity: Not on file  Other Topics Concern   Not on file  Social History Narrative   Not on file   Social Drivers of Health   Financial Resource Strain: Not on file  Food Insecurity: Not on file  Transportation Needs: Not on file  Physical Activity: Not on file  Stress: Not on file  Social Connections: Not on file    Allergies:  Allergies  Allergen Reactions   Shellfish Allergy Itching    Objective:    Vital Signs:   Temp:  [97.6 F (36.4 C)-98.4 F (36.9 C)] 98.3 F (36.8 C) (05/22 1559) Pulse Rate:  [102-107] 103 (05/22 1600) Resp:  [20-36] 22 (05/22 1600) BP: (123-148)/(89-97) 130/96 (05/22 1600) SpO2:  [96 %-100 %] 96 % (05/22 1600) Weight:  [59 kg] 59 kg (05/22 0708)    Weight change: Filed Weights   11/23/23 0708  Weight: 59 kg    Intake/Output:   Intake/Output Summary (Last 24 hours) at 11/23/2023 1625 Last data filed at 11/23/2023 1444 Gross per 24 hour  Intake --  Output 1150 ml  Net -1150 ml      Physical Exam    General:  Well appearing, thin. No resp difficulty HEENT: normal Neck: supple. JVP 12 cm . Carotids 2+ bilat; no bruits. No lymphadenopathy or thyromegaly appreciated. Cor: PMI nondisplaced. Regular rate & rhythm.  Lungs: decreased BS at the bases bilaterally  Abdomen: soft, nontender, +distended.  Extremities: no cyanosis, clubbing, rash, edema Neuro: alert & orientedx3, cranial nerves grossly intact. moves all 4 extremities w/o difficulty. Affect pleasant   Telemetry   Sinus tach low 100s, personally reviewed   EKG    Sinus tach 104 bpm w/ atypical LBBB, QRS 154 ms, BAE   Labs   Basic Metabolic Panel: Recent Labs  Lab 11/23/23 0715  NA 139  K 4.2  CL 109  CO2 22  GLUCOSE 104*  BUN 9  CREATININE 1.08  CALCIUM  9.2    Liver Function Tests: Recent Labs  Lab 11/23/23 0715  AST 20  ALT 15  ALKPHOS 58  BILITOT 0.7  PROT 7.0  ALBUMIN 3.3*   Recent Labs  Lab  11/23/23 0715  LIPASE 30   No results for input(s): "AMMONIA" in the last 168 hours.  CBC: Recent Labs  Lab 11/23/23 0715  WBC 13.7*  NEUTROABS 9.4*  HGB 13.5  HCT 41.6  MCV 73.4*  PLT 252    Cardiac Enzymes: No results for input(s): "CKTOTAL", "CKMB", "CKMBINDEX", "TROPONINI" in the last 168 hours.  BNP: BNP (last 3 results) Recent Labs    11/23/23 0829  BNP 2,481.6*    ProBNP (last 3 results) No results for input(s): "PROBNP" in the last 8760 hours.   CBG: No results for input(s): "GLUCAP" in the last 168 hours.  Coagulation  Studies: No results for input(s): "LABPROT", "INR" in the last 72 hours.   Imaging   ECHOCARDIOGRAM COMPLETE Result Date: 11/23/2023    ECHOCARDIOGRAM REPORT   Patient Name:   Jesse Key Date of Exam: 11/23/2023 Medical Rec #:  [id number]      Height:       65.0 in Accession #:    [id number]     Weight:       130.0 lb Date of Birth:  03-Aug-1965       BSA:          1.647 m Patient Age:    58 years       BP:           142/90 mmHg Patient Gender: M              HR:           105 bpm. Exam Location:  Inpatient Procedure: 2D Echo, Cardiac Doppler, Color Doppler and Intracardiac            Opacification Agent (Both Spectral and Color Flow Doppler were            utilized during procedure). Indications:    Dyspnea  History:        Patient has no prior history of Echocardiogram examinations.  Sonographer:    Sulema Endo RDCS, FE, PE Referring Phys: [id number] Jarome Merritt RUMBALL IMPRESSIONS  1. Left ventricular ejection fraction, by estimation, is <20%. The left ventricle has normal function. The left ventricle demonstrates global hypokinesis. The left ventricular internal cavity size was mildly dilated. Indeterminate diastolic filling due to E-A fusion. LVOT VTI of 7.85 with SV of 25cc concerning for low output heart failure.  2. Right ventricular systolic function is normal. The right ventricular size is normal.  3. Left atrial size was moderately dilated.  4.  Right atrial size was mildly dilated.  5. The mitral valve is normal in structure. Mild to moderate mitral valve regurgitation.  6. The aortic valve is normal in structure. Aortic valve regurgitation is not visualized.  7. The inferior vena cava is normal in size with greater than 50% respiratory variability, suggesting right atrial pressure of 3 mmHg. FINDINGS  Left Ventricle: Left ventricular ejection fraction, by estimation, is <20%. The left ventricle has normal function. The left ventricle demonstrates global hypokinesis. Definity  contrast agent was given IV to delineate the left ventricular endocardial borders. The left ventricular internal cavity size was mildly dilated. There is no left ventricular hypertrophy. Indeterminate diastolic filling due to E-A fusion. Right Ventricle: The right ventricular size is normal. No increase in right ventricular wall thickness. Right ventricular systolic function is normal. Left Atrium: Left atrial size was moderately dilated. Right Atrium: Right atrial size was mildly dilated. Pericardium: Trivial pericardial effusion is present. Mitral Valve: The mitral valve is normal in structure. Mild to moderate mitral valve regurgitation. Tricuspid Valve: The tricuspid valve is normal in structure. Tricuspid valve regurgitation is trivial. Aortic Valve: The aortic valve is normal in structure. Aortic valve regurgitation is not visualized. Aortic valve mean gradient measures 2.0 mmHg. Aortic valve peak gradient measures 3.8 mmHg. Aortic valve area, by VTI measures 1.79 cm. Pulmonic Valve: The pulmonic valve was normal in structure. Pulmonic valve regurgitation is not visualized. Aorta: The aortic root is normal in size and structure. Venous: The inferior vena cava is normal in size with greater than 50% respiratory variability, suggesting right atrial pressure of 3 mmHg. IAS/Shunts: No atrial level shunt detected by color  flow Doppler.  LEFT VENTRICLE PLAX 2D LVIDd:         5.70 cm       Diastology LVIDs:         5.10 cm      LV e' medial:    6.74 cm/s LV PW:         1.00 cm      LV E/e' medial:  13.9 LV IVS:        0.80 cm      LV e' lateral:   8.27 cm/s LVOT diam:     2.00 cm      LV E/e' lateral: 11.3 LV SV:         25 LV SV Index:   15 LVOT Area:     3.14 cm  LV Volumes (MOD) LV vol d, MOD A2C: 159.0 ml LV vol d, MOD A4C: 209.0 ml LV vol s, MOD A2C: 124.0 ml LV vol s, MOD A4C: 169.0 ml LV SV MOD A2C:     35.0 ml LV SV MOD A4C:     209.0 ml LV SV MOD BP:      39.7 ml RIGHT VENTRICLE RV Basal diam:  4.40 cm RV Mid diam:    2.90 cm RV S prime:     9.25 cm/s TAPSE (M-mode): 1.6 cm LEFT ATRIUM             Index        RIGHT ATRIUM           Index LA diam:        4.20 cm 2.55 cm/m   RA Area:     19.00 cm LA Vol (A2C):   75.5 ml 45.84 ml/m  RA Volume:   55.30 ml  33.57 ml/m LA Vol (A4C):   72.2 ml 43.83 ml/m LA Biplane Vol: 74.2 ml 45.05 ml/m  AORTIC VALVE AV Area (Vmax):    1.75 cm AV Area (Vmean):   1.83 cm AV Area (VTI):     1.79 cm AV Vmax:           97.40 cm/s AV Vmean:          62.500 cm/s AV VTI:            0.138 m AV Peak Grad:      3.8 mmHg AV Mean Grad:      2.0 mmHg LVOT Vmax:         54.20 cm/s LVOT Vmean:        36.400 cm/s LVOT VTI:          0.078 m LVOT/AV VTI ratio: 0.57  AORTA Ao Root diam: 3.20 cm Ao Asc diam:  3.10 cm MITRAL VALVE               TRICUSPID VALVE MV Area (PHT): 5.09 cm    TR Peak grad:   43.8 mmHg MV Decel Time: 149 msec    TR Vmax:        331.00 cm/s MV E velocity: 93.40 cm/s                            SHUNTS                            Systemic VTI:  0.08 m  Systemic Diam: 2.00 cm Aditya Sabharwal Electronically signed by Alwin Baars Signature Date/Time: 11/23/2023/3:55:03 PM    Final    CT Angio Chest Pulmonary Embolism (PE) W or WO Contrast Result Date: 11/23/2023 CLINICAL DATA:  Dyspnea, chronic, chest wall or pleura disease suspected EXAM: CT ANGIOGRAPHY CHEST WITH CONTRAST TECHNIQUE: Multidetector CT imaging of the  chest was performed using the standard protocol during bolus administration of intravenous contrast. Multiplanar CT image reconstructions and MIPs were obtained to evaluate the vascular anatomy. RADIATION DOSE REDUCTION: This exam was performed according to the departmental dose-optimization program which includes automated exposure control, adjustment of the mA and/or kV according to patient size and/or use of iterative reconstruction technique. CONTRAST:  75mL OMNIPAQUE  IOHEXOL  350 MG/ML SOLN COMPARISON:  Nov 23, 2023 FINDINGS: Pulmonary Embolism: No pulmonary embolism. Cardiovascular: Mild cardiomegaly. Trace pericardial effusion. No aortic aneurysm. Scattered atherosclerosis in the descending aorta Mediastinum/Nodes: No mediastinal mass. No mediastinal, hilar, or axillary lymphadenopathy. Lungs/Pleura: The midline trachea and bronchi are patent. Biapical pleuroparenchymal scarring. Mild interlobular septal thickening within both lower lobes with hazy dependent ground-glass airspace opacities. Small bilateral pleural effusions. No pneumothorax. A small amount of fluid is also present within both oblique fissures, more so on the left than the right. Musculoskeletal: No acute fracture or destructive bone lesion. Upper Abdomen: Likely small cyst in the upper pole of the left kidney. Reflux of contrast into the hepatic veins, suggesting underlying cardiac dysfunction. Review of the MIP images confirms the above findings. IMPRESSION: 1. No pulmonary embolism. 2. Mild cardiomegaly. Pulmonary findings suggestive of mild pulmonary edema with small bilateral pleural effusions. Aortic Atherosclerosis (ICD10-I70.0). Electronically Signed   By: Rance Burrows M.D.   On: 11/23/2023 15:17   DG Chest 2 View Result Date: 11/23/2023 CLINICAL DATA:  Shortness of breath. EXAM: CHEST - 2 VIEW COMPARISON:  April 09, 2017. FINDINGS: Mild cardiomegaly is noted with central pulmonary vascular congestion. Minimal bibasilar pulmonary  edema is noted. Bony thorax is unremarkable. IMPRESSION: Mild cardiomegaly with central pulmonary vascular congestion. Minimal bibasilar pulmonary edema. Electronically Signed   By: Rosalene Colon M.D.   On: 11/23/2023 07:49     Medications:     Current Medications:  enoxaparin  (LOVENOX ) injection  40 mg Subcutaneous Q24H    Infusions:     Patient Profile   58 y/o male smoker w/ h/o untreated HTN, ETOH use and no routine medical care presented w/ new systolic heart failure.   Assessment/Plan   1. Acute Systolic Heart Failure - new diagnosis - Echo EF < 20%, RV nl, global HK, mid-mod MR. LVOT VTI of 7.85 with SV of 25cc concerning for low output  heart failure  - NYHA Class IIIb. Good response thus far to IV Lasix   but remains mildly volume overload. Give another dose of IV Lasix  this evening, increase to 40 mg  - Start losartan  12.5 mg daily  - Start Spironolactone  12.5 mg daily - Start digoxin  0.125 mg  - Eventual SGLT2i prior to d/c  - Etiology uncertain. Hs trops not c/w ACS but will need R/LHC after diuresis. Has multiple risk factors for IHD. Plan cath for tomorrow - cMRI if cath unremarkable  - check HIV and UDS - reduction of EOTH imperative - check iron  stores given low MCV. If IV Fe indicated, please wait until after completion of cMRI    2. HTN - known history though untreated - start HF GDMT per above  - reports h/o snoring, recommend outpatient sleep study   3.  ETOH/Tobacco Use - drinks 2-3 16 oz beers/day, smokes 1/2 ppd - cessation advised    Length of Stay: 0  Ruddy Corral, PA-C  11/23/2023, 4:25 PM    Advanced Heart Failure Team Pager [phone] (M-F; 7a - 5p)  Please contact [facility] for night-coverage after hours (4p -7a ) and weekends on [email or web address]  Patient seen and examined with the above-signed Advanced Practice Provider and/or Housestaff. I personally reviewed laboratory data, imaging studies and relevant notes. I independently  examined the patient and formulated the important aspects of the plan. I have edited the note to reflect any of my changes or salient points. I have personally discussed the plan with the patient and/or family.  58 y/o male with undiagnosed HTN, ETOH/tob abuse presents with new onset HF.   Denies any h/o heart disease. Developed HF symptoms over the last week.   In ER EF ~20% with dyssynchrony. RV ok  ECG LBBB  Hstrop negative  Scr 1.08   General:  Sitting up in bed. No resp difficulty HEENT: normal Neck: supple. JVP 10 Carotids 2+ bilat; no bruits. No lymphadenopathy or thryomegaly appreciated. Cor: PMI nondisplaced. Regular rate & rhythm. + summation gallop Lungs: clear Abdomen: soft, nontender, nondistended. No hepatosplenomegaly. No bruits or masses. Good bowel sounds. Extremities: no cyanosis, clubbing, rash,1+  edema Neuro: alert & orientedx3, cranial nerves grossly intact. moves all 4 extremities w/o difficulty. Affect pleasant  He has new onset systolic HF. Based on his echo, I suspect he has LBBB CM but has multiple CRFs. ETOH CM less likely with normal RV  Plan: 1. Diurese 2. Start GDMT 3. R/L cath +/- cMRI tomorrow 4. Discussed ETOH/tob cessation 5. Check iron  stores (MCV is low)  Jules Oar, MD  5:31 PM

## 2023-11-23 NOTE — Assessment & Plan Note (Addendum)
 Patient comes in with no past medical history noted to have left bundle branch block on EKG, although does not meet criteria for Sgarbossa.  Suspect demand ischemia likely in the setting of acute CHF exacerbation.  Will continue to monitor. - Follow-up troponin

## 2023-11-23 NOTE — Assessment & Plan Note (Addendum)
 Patient comes in with 2 days of dyspnea, thought to be asthma exacerbation, found to have acute new onset CHF exacerbation.  Patient with BNP of 2481, with cardiomegaly, and pulmonary congestion, and mild bibasilar pulmonary edema on chest x-ray, but no edema on extremities. Patient brought in by EMS given IV Lasix  20 mg with good urine output.  Will plan to manage CHF exacerbation.  Patient had elevated troponin likely in the setting of demand ischemia, but does have left bundle branch block on EKG, does not meet Sgarbossa criteria. - Admit to PTS attending Dr. Brien Can - Consult cardiology - Continuous cardiac monitoring - Strict I's and O's - Echo - Vitals per unit - PT/ OT - Continue IV Lasix  20 mg - AM CBC, BMP

## 2023-11-23 NOTE — Assessment & Plan Note (Addendum)
 Patient noted to have leukocytosis of 13.1.  Reports productive cough for last 2 days, denies any fevers or systemic symptoms.  Patient chest x-ray negative for pneumonia, low concern for infection at this time.  Suspect this is reactive inflammatory marker.  Will continue to monitor - AM CBC

## 2023-11-23 NOTE — H&P (Addendum)
 Hospital Admission History and Physical Service Pager: [phone]  Patient name: Jesse Key Medical record number: [id number] Date of Birth: 1966-06-15 Age: 58 y.o. Gender: male  Primary Care Provider: Pcp, No Consultants: None Code Status: Full Code  Preferred Emergency Contact:  Jesse Key [phone] - Brother  Chief Complaint: Shortness of breath  Assessment and Plan: Jesse Key is a 58 y.o. male presenting with dyspnea. Differential for this patient's presentation of this includes CHF, Asthma, ACS, PNA. CHF - most likely given elevated BNP, and recent hx of PND ACS - possible given elevated troponin, although could be demand ischemia, will follow troponin PNA - less likely as no consolidation on CXR, but does have elevated WBC Asthma - less likely given BNP, but has hx of asthma, breathing improving with lasix , respiratory exam no wheezing, and good air movement  Assessment & Plan Heart failure (HCC) Patient comes in with 2 days of dyspnea, thought to be asthma exacerbation, found to have acute new onset CHF exacerbation.  Patient with BNP of 2481, with cardiomegaly, and pulmonary congestion, and mild bibasilar pulmonary edema on chest x-ray, but no edema on extremities. Patient brought in by EMS given IV Lasix  20 mg with good urine output.  Will plan to manage CHF exacerbation.  Patient had elevated troponin likely in the setting of demand ischemia, but does have left bundle branch block on EKG, does not meet Sgarbossa criteria. - Admit to PTS attending Dr. Brien Can - Consult cardiology - Continuous cardiac monitoring - Strict I's and O's - Echo - Vitals per unit - PT/ OT - Continue IV Lasix  20 mg - AM CBC, BMP ETOH abuse Patient reports prolonged history of EtOH abuse.  Patient reports drinking 12 ounce x 2 cans of beer a day, sometimes more if watching game, for years.  Last drink was yesterday.  Will start CIWA's. - CIWA's q4h Elevated troponin Patient  comes in with no past medical history noted to have left bundle branch block on EKG, although does not meet criteria for Sgarbossa.  Suspect demand ischemia likely in the setting of acute CHF exacerbation.  Will continue to monitor. - Follow-up troponin Leukocytosis Patient noted to have leukocytosis of 13.1.  Reports productive cough for last 2 days, denies any fevers or systemic symptoms.  Patient chest x-ray negative for pneumonia, low concern for infection at this time.  Suspect this is reactive inflammatory marker.  Will continue to monitor - AM CBC  Chronic and Stable Conditions: Asthma - albuterol  PRN, last flare was a year ago  FEN/GI: Regular Diet VTE Prophylaxis: Lovenox   Disposition: Home  History of Present Illness:  Jesse Key is a 58 y.o. male presenting with dyspnea.  Breathing started getting bad a couple days ago, throat was sore, swallowing hurt. Was having SOB today when moving around and had to sleep sitting up in chair last night, because of breathing was so difficult. Told his brother he was having a hard time breathing and catching his breath. Brother called the ambulance and he was brought to [facility].   Notes some chest tightness for the last couple days, thought it was his asthma. Notes some cough that's productive, but no fevers, or systemic symptoms. Notes he's had diarrhea for the last 2 days, 2-3 x in a day.   In the ED, patient was given duonebs and lasix  with improvement in symptoms.   Review Of Systems: Per HPI with the following additions:   Pertinent Past Medical History: Asthma Remainder  reviewed in history tab.   Pertinent Past Surgical History: None   Remainder reviewed in history tab.  Pertinent Social History: Tobacco use: Yes/been smoking since age of 71, smokes 1 PPD Alcohol use: 2 beers a day, for years, some times a 12 pack while watching the game Other Substance use: None Lives with Cousin - Sulema Endo   Pertinent Family  History: Diabetes - Mother  Remainder reviewed in history tab.   Important Outpatient Medications: None   Remainder reviewed in medication history.   Objective: BP (!) 141/89 (BP Location: Right Arm)   Pulse (!) 104   Temp 97.6 F (36.4 C) (Oral)   Resp 20   Ht 5\' 5"  (1.651 m)   Wt 59 kg   SpO2 100%   BMI 21.63 kg/m  Exam: General: NAD, Conversational Cardiovascular: RRR, NRMG, S1/S2 Respiratory: CTABL, good WOB, no wheezing Gastrointestinal: Soft, nttp, non-distended MSK: Moving all extremities, no edema   Labs:  CBC BMET  Recent Labs  Lab 11/23/23 0715  WBC 13.7*  HGB 13.5  HCT 41.6  PLT 252   Recent Labs  Lab 11/23/23 0715  NA 139  K 4.2  CL 109  CO2 22  BUN 9  CREATININE 1.08  GLUCOSE 104*  CALCIUM  9.2    Other labs: Trop 45  EKG: My own interpretation (not copied from electronic read) LBB - does not meet Sgarbosa criteria, no previous EKG to review    Imaging Studies Performed:  DG Chest 2 View Result Date: 11/23/2023 CLINICAL DATA:  Shortness of breath. EXAM: CHEST - 2 VIEW COMPARISON:  April 09, 2017. FINDINGS: Mild cardiomegaly is noted with central pulmonary vascular congestion. Minimal bibasilar pulmonary edema is noted. Bony thorax is unremarkable. IMPRESSION: Mild cardiomegaly with central pulmonary vascular congestion. Minimal bibasilar pulmonary edema. Electronically Signed   By: Rosalene Colon M.D.   On: 11/23/2023 07:49     Wilhemena Harbour, MD 11/23/2023, 10:30 AM PGY-3, [facility]  FPTS Intern pager: [phone], text pages welcome Secure chat group [facility] Teaching Service

## 2023-11-23 NOTE — ED Provider Notes (Signed)
 Emergency Department Provider Note   I have reviewed the triage vital signs and the nursing notes.   HISTORY  Chief Complaint Shortness of Breath   HPI Jesse Key is a 58 y.o. male with history of asthma presents to the emergency department for evaluation of shortness of breath starting this morning.  He has a history of asthma but did not have his inhaler with him.  He feels some central tightness in his chest but no sharp, pleuritic pain.  No fevers or chills but has had some nasal congestion and cough over the past several days.  He is also experiencing very dry mouth which he attributes to starting some new medication for back spasms.  He cannot recall the name of the medicine.  No abdominal pain, vomiting, diarrhea.    History reviewed. No pertinent past medical history.  Review of Systems  Constitutional: No fever/chills Cardiovascular: Denies chest pain. Respiratory: Positive shortness of breath. Gastrointestinal: No abdominal pain.  No nausea, no vomiting.  Skin: Negative for rash. Neurological: Negative for headaches.  ____________________________________________   PHYSICAL EXAM:  VITAL SIGNS: ED Triage Vitals  Encounter Vitals Group     BP 11/23/23 0707 (!) 141/89     Pulse Rate 11/23/23 0707 (!) 104     Resp 11/23/23 0707 20     Temp 11/23/23 0707 97.6 F (36.4 C)     Temp Source 11/23/23 0707 Oral     SpO2 11/23/23 0707 100 %     Weight 11/23/23 0708 130 lb (59 kg)     Height 11/23/23 0708 5\' 5"  (1.651 m)   Constitutional: Alert and oriented. Well appearing and in no acute distress. Eyes: Conjunctivae are normal. Head: Atraumatic. Nose: No congestion/rhinnorhea. Mouth/Throat: Mucous membranes are moist.  Neck: No stridor.  Cardiovascular: Normal rate, regular rhythm. Good peripheral circulation. Grossly normal heart sounds.   Respiratory: Normal respiratory effort.  No retractions. Lungs CTAB. Gastrointestinal: Soft and nontender. No  distention.  Musculoskeletal: No lower extremity tenderness nor edema. No gross deformities of extremities. Neurologic:  Normal speech and language. No gross focal neurologic deficits are appreciated.  Skin:  Skin is warm, dry and intact. No rash noted.  ____________________________________________   LABS (all labs ordered are listed, but only abnormal results are displayed)  Labs Reviewed  CBC WITH DIFFERENTIAL/PLATELET - Abnormal; Notable for the following components:      Result Value   WBC 13.7 (*)    MCV 73.4 (*)    MCH 23.8 (*)    RDW 15.9 (*)    Neutro Abs 9.4 (*)    All other components within normal limits  COMPREHENSIVE METABOLIC PANEL WITH GFR - Abnormal; Notable for the following components:   Glucose, Bld 104 (*)    Albumin 3.3 (*)    All other components within normal limits  BRAIN NATRIURETIC PEPTIDE - Abnormal; Notable for the following components:   B Natriuretic Peptide 2,481.6 (*)    All other components within normal limits  TROPONIN I (HIGH SENSITIVITY) - Abnormal; Notable for the following components:   Troponin I (High Sensitivity) 45 (*)    All other components within normal limits  RESP PANEL BY RT-PCR (RSV, FLU A&B, COVID)  RVPGX2  LIPASE, BLOOD  TROPONIN I (HIGH SENSITIVITY)   ____________________________________________  EKG   EKG Interpretation Date/Time:  Thursday Nov 23 2023 07:08:03 EDT Ventricular Rate:  104 PR Interval:  175 QRS Duration:  154 QT Interval:  372 QTC Calculation: 490 R Axis:  34  Text Interpretation: Sinus tachycardia Biatrial enlargement IVCD, consider atypical LBBB No old tracing to compare Confirmed by Abby Hocking [phone]) on 11/23/2023 7:23:31 AM        ____________________________________________  RADIOLOGY  DG Chest 2 View Result Date: 11/23/2023 CLINICAL DATA:  Shortness of breath. EXAM: CHEST - 2 VIEW COMPARISON:  April 09, 2017. FINDINGS: Mild cardiomegaly is noted with central pulmonary vascular  congestion. Minimal bibasilar pulmonary edema is noted. Bony thorax is unremarkable. IMPRESSION: Mild cardiomegaly with central pulmonary vascular congestion. Minimal bibasilar pulmonary edema. Electronically Signed   By: Rosalene Colon M.D.   On: 11/23/2023 07:49    ____________________________________________   PROCEDURES  Procedure(s) performed:   Procedures  None ____________________________________________   INITIAL IMPRESSION / ASSESSMENT AND PLAN / ED COURSE  Pertinent labs & imaging results that were available during my care of the patient were reviewed by me and considered in my medical decision making (see chart for details).   This patient is Presenting for Evaluation of SOB, which does require a range of treatment options, and is a complaint that involves a high risk of morbidity and mortality.  The Differential Diagnoses include asthma exacerbation, CAP, CHF, ACS, PE, URI, etc.  Critical Interventions-    Medications  ipratropium-albuterol  (DUONEB) 0.5-2.5 (3) MG/3ML nebulizer solution 3 mL (3 mLs Nebulization Given 11/23/23 1013)  furosemide  (LASIX ) injection 20 mg (20 mg Intravenous Given 11/23/23 1019)    Reassessment after intervention:  symptoms improved.   Clinical Laboratory Tests Ordered, included BNP elevated to 2400.  Slightly limited troponin to 45, likely demand ischemia.  No severe anemia.  No AKI. COVID negative.   Radiologic Tests Ordered, included CXR. I independently interpreted the images and agree with radiology interpretation.   Cardiac Monitor Tracing which shows sinus tachycardia.   Consult complete with Family Medicine. Plan for admit.   Medical Decision Making: Summary:  Patient presents emergency department shortness of breath starting this morning.  Some associated chest tightness.  EKG shows left bundle branch block but no old tracings for comparison.  No STEMI criteria.  Plan for screening blood work including troponins and will start  with a DuoNeb given history of asthma and subjectively feeling similar to prior exacerbations.  Reevaluation with update and discussion with patient.  Discussed results of imaging and labs.  Planning to admit for concern for new CHF and need for diuresis.  Patient with some mild tachypnea on reassessment but no hypoxemia.  No distress.  Will continue to follow with diuresis.  He is in agreement with plan for admit.  Patient's presentation is most consistent with acute presentation with potential threat to life or bodily function.   Disposition: admit  ____________________________________________  FINAL CLINICAL IMPRESSION(S) / ED DIAGNOSES  Final diagnoses:  Shortness of breath  Acute pulmonary edema (HCC)    Note:  This document was prepared using Dragon voice recognition software and may include unintentional dictation errors.  Abby Hocking, MD, [facility] Emergency Medicine    Jozlynn Plaia, Shereen Dike, MD 11/23/23 1028

## 2023-11-23 NOTE — Assessment & Plan Note (Addendum)
 Patient reports prolonged history of EtOH abuse.  Patient reports drinking 12 ounce x 2 cans of beer a day, sometimes more if watching game, for years.  Last drink was yesterday.  Will start CIWA's. - CIWA's q4h

## 2023-11-23 NOTE — ED Notes (Signed)
 Patient transported to Ultrasound

## 2023-11-23 NOTE — Plan of Care (Addendum)
 FMTS Interim Progress Note  S: Arrived at patient's bedside with Dr. Henreitta Locus for night rounds.  Patient reports dyspnea started since Monday. No lower extremity edema, fevers, or chills. Discussed that diagnosis of heart failure and so how it can be manageable for years. Discussed that he have catheter tomorrow. No known hx of heart failure per patient. Symptom mildly improved  O: BP 120/80 (BP Location: Left Arm)   Pulse 99   Temp 99.1 F (37.3 C) (Oral)   Resp 20   Ht 5\' 5"  (1.651 m)   Wt 59 kg   SpO2 96%   BMI 21.63 kg/m   General: Alert, well appearing, NAD CV: RRR, no murmurs, normal S1/S2 Pulm: Decreased breath sounds Abd: Soft, no distension, no tenderness Ext: No BLE edema   A/P: HF exacerbation Patient admitted earlier today for worsening dyspnea and echo obtained showed EF ~20%.  He is status post 2 doses of IV Lasix  with good urine output so far.  Cardiology following and plans cardiac cath tomorrow 5/23.  GDMT started -Cardiology following, appreciate recs - S/p IV Lasix  - N.p.o. at midnight in anticipation to procedure - Held Lovenox  for upcoming cardiac cath - Started GDMT - Strict I's and Artice Bills, MD 11/23/2023, 9:56 PM PGY-3, [facility] Family Medicine Service pager [phone]

## 2023-11-24 ENCOUNTER — Encounter (HOSPITAL_COMMUNITY): Payer: Self-pay | Admitting: Family Medicine

## 2023-11-24 ENCOUNTER — Other Ambulatory Visit (HOSPITAL_BASED_OUTPATIENT_CLINIC_OR_DEPARTMENT_OTHER): Payer: Self-pay

## 2023-11-24 ENCOUNTER — Encounter (HOSPITAL_COMMUNITY)
Admission: EM | Disposition: A | Discharge status: Home / Self Care | Payer: Self-pay | Source: Home / Self Care | Attending: Family Medicine

## 2023-11-24 ENCOUNTER — Inpatient Hospital Stay (HOSPITAL_COMMUNITY)

## 2023-11-24 DIAGNOSIS — I2510 Atherosclerotic heart disease of native coronary artery without angina pectoris: Secondary | ICD-10-CM

## 2023-11-24 DIAGNOSIS — I428 Other cardiomyopathies: Secondary | ICD-10-CM

## 2023-11-24 DIAGNOSIS — I10 Essential (primary) hypertension: Secondary | ICD-10-CM

## 2023-11-24 DIAGNOSIS — J810 Acute pulmonary edema: Secondary | ICD-10-CM | POA: Diagnosis not present

## 2023-11-24 DIAGNOSIS — I509 Heart failure, unspecified: Secondary | ICD-10-CM | POA: Diagnosis not present

## 2023-11-24 DIAGNOSIS — F1010 Alcohol abuse, uncomplicated: Secondary | ICD-10-CM | POA: Diagnosis not present

## 2023-11-24 HISTORY — PX: RIGHT/LEFT HEART CATH AND CORONARY ANGIOGRAPHY: CATH118266

## 2023-11-24 LAB — CBC
HCT: 43.9 % (ref 39.0–52.0)
HCT: 46.1 % (ref 39.0–52.0)
Hemoglobin: 14.2 g/dL (ref 13.0–17.0)
Hemoglobin: 14.8 g/dL (ref 13.0–17.0)
MCH: 23.4 pg — ABNORMAL LOW (ref 26.0–34.0)
MCH: 23.3 pg — ABNORMAL LOW (ref 26.0–34.0)
MCHC: 32.3 g/dL (ref 30.0–36.0)
MCHC: 32.1 g/dL (ref 30.0–36.0)
MCV: 72.4 fL — ABNORMAL LOW (ref 80.0–100.0)
MCV: 72.7 fL — ABNORMAL LOW (ref 80.0–100.0)
Platelets: 273 K/uL (ref 150–400)
Platelets: 294 K/uL (ref 150–400)
RBC: 6.06 MIL/uL — ABNORMAL HIGH (ref 4.22–5.81)
RBC: 6.34 MIL/uL — ABNORMAL HIGH (ref 4.22–5.81)
RDW: 15.6 % — ABNORMAL HIGH (ref 11.5–15.5)
RDW: 16.5 % — ABNORMAL HIGH (ref 11.5–15.5)
WBC: 11.9 K/uL — ABNORMAL HIGH (ref 4.0–10.5)
WBC: 11.3 K/uL — ABNORMAL HIGH (ref 4.0–10.5)
nRBC: 0 % (ref 0.0–0.2)
nRBC: 0 % (ref 0.0–0.2)

## 2023-11-24 LAB — BASIC METABOLIC PANEL WITH GFR
Anion gap: 9 (ref 5–15)
BUN: 20 mg/dL (ref 6–20)
CO2: 27 mmol/L (ref 22–32)
Calcium: 9.1 mg/dL (ref 8.9–10.3)
Chloride: 103 mmol/L (ref 98–111)
Creatinine, Ser: 1.53 mg/dL — ABNORMAL HIGH (ref 0.61–1.24)
GFR, Estimated: 52 mL/min — ABNORMAL LOW (ref >60)
Glucose, Bld: 98 mg/dL (ref 70–99)
Potassium: 3.7 mmol/L (ref 3.5–5.1)
Sodium: 139 mmol/L (ref 135–145)

## 2023-11-24 LAB — POCT I-STAT EG7
Acid-Base Excess: 1 mmol/L (ref 0.0–2.0)
Acid-base deficit: 8 mmol/L — ABNORMAL HIGH (ref 0.0–2.0)
Bicarbonate: 17.1 mmol/L — ABNORMAL LOW (ref 20.0–28.0)
Bicarbonate: 25.8 mmol/L (ref 20.0–28.0)
Calcium, Ion: 0.54 mmol/L — CL (ref 1.15–1.40)
Calcium, Ion: 1.08 mmol/L — ABNORMAL LOW (ref 1.15–1.40)
HCT: 33 % — ABNORMAL LOW (ref 39.0–52.0)
HCT: 48 % (ref 39.0–52.0)
Hemoglobin: 11.2 g/dL — ABNORMAL LOW (ref 13.0–17.0)
Hemoglobin: 16.3 g/dL (ref 13.0–17.0)
O2 Saturation: 57 %
O2 Saturation: 60 %
Potassium: 2.2 mmol/L — CL (ref 3.5–5.1)
Potassium: 3.7 mmol/L (ref 3.5–5.1)
Sodium: 144 mmol/L (ref 135–145)
Sodium: 151 mmol/L — ABNORMAL HIGH (ref 135–145)
TCO2: 18 mmol/L — ABNORMAL LOW (ref 22–32)
TCO2: 27 mmol/L (ref 22–32)
pCO2, Ven: 32.4 mmHg — ABNORMAL LOW (ref 44–60)
pCO2, Ven: 41.2 mmHg — ABNORMAL LOW (ref 44–60)
pH, Ven: 7.33 (ref 7.25–7.43)
pH, Ven: 7.404 (ref 7.25–7.43)
pO2, Ven: 30 mmHg — CL (ref 32–45)
pO2, Ven: 33 mmHg (ref 32–45)

## 2023-11-24 LAB — POCT I-STAT 7, (LYTES, BLD GAS, ICA,H+H)
Acid-Base Excess: 0 mmol/L (ref 0.0–2.0)
Bicarbonate: 23.6 mmol/L (ref 20.0–28.0)
Calcium, Ion: 1.09 mmol/L — ABNORMAL LOW (ref 1.15–1.40)
HCT: 45 % (ref 39.0–52.0)
Hemoglobin: 15.3 g/dL (ref 13.0–17.0)
O2 Saturation: 94 %
Potassium: 3.7 mmol/L (ref 3.5–5.1)
Sodium: 142 mmol/L (ref 135–145)
TCO2: 25 mmol/L (ref 22–32)
pCO2 arterial: 34.6 mmHg (ref 32–48)
pH, Arterial: 7.442 (ref 7.35–7.45)
pO2, Arterial: 66 mmHg — ABNORMAL LOW (ref 83–108)

## 2023-11-24 LAB — CREATININE, SERUM
Creatinine, Ser: 1.5 mg/dL — ABNORMAL HIGH (ref 0.61–1.24)
GFR, Estimated: 53 mL/min — ABNORMAL LOW (ref >60)

## 2023-11-24 LAB — HIV ANTIBODY (ROUTINE TESTING W REFLEX): HIV Screen 4th Generation wRfx: NONREACTIVE

## 2023-11-24 SURGERY — RIGHT/LEFT HEART CATH AND CORONARY ANGIOGRAPHY
Anesthesia: LOCAL

## 2023-11-24 MED ORDER — ENOXAPARIN SODIUM 40 MG/0.4ML IJ SOSY
40.0000 mg | PREFILLED_SYRINGE | INTRAMUSCULAR | Status: DC
Start: 2023-11-25 — End: 2023-11-25
  Administered 2023-11-25 (×2): 40 mg via SUBCUTANEOUS
  Filled 2023-11-24: qty 0.4

## 2023-11-24 MED ORDER — ROSUVASTATIN CALCIUM 20 MG PO TABS
20.0000 mg | ORAL_TABLET | Freq: Every day | ORAL | Status: DC
Start: 2023-11-24 — End: 2023-11-25
  Administered 2023-11-24 – 2023-11-25 (×4): 20 mg via ORAL
  Filled 2023-11-24 (×2): qty 1

## 2023-11-24 MED ORDER — HYDRALAZINE HCL 20 MG/ML IJ SOLN: 10.0000 mg | INTRAMUSCULAR | Status: AC | PRN

## 2023-11-24 MED ORDER — MIDAZOLAM HCL 2 MG/2ML IJ SOLN
INTRAMUSCULAR | Status: AC
  Filled 2023-11-24: qty 2

## 2023-11-24 MED ORDER — GADOBUTROL 1 MMOL/ML IV SOLN
9.0000 mL | Freq: Once | INTRAVENOUS | Status: AC | PRN
  Administered 2023-11-24 (×2): 9 mL via INTRAVENOUS

## 2023-11-24 MED ORDER — ACETAMINOPHEN 325 MG PO TABS: 650.0000 mg | ORAL_TABLET | ORAL | Status: DC | PRN

## 2023-11-24 MED ORDER — ASPIRIN 81 MG PO TBEC
81.0000 mg | DELAYED_RELEASE_TABLET | Freq: Every day | ORAL | Status: DC
  Administered 2023-11-25 (×2): 81 mg via ORAL
  Filled 2023-11-24 (×2): qty 1

## 2023-11-24 MED ORDER — SODIUM CHLORIDE 0.9% FLUSH: 3.0000 mL | INTRAVENOUS | Status: DC | PRN

## 2023-11-24 MED ORDER — DAPAGLIFLOZIN PROPANEDIOL 10 MG PO TABS
10.0000 mg | ORAL_TABLET | Freq: Every day | ORAL | Status: DC
  Administered 2023-11-24 – 2023-11-25 (×4): 10 mg via ORAL
  Filled 2023-11-24 (×2): qty 1

## 2023-11-24 MED ORDER — MIDAZOLAM HCL 2 MG/2ML IJ SOLN
INTRAMUSCULAR | Status: DC | PRN
  Administered 2023-11-24 (×2): 2 mg via INTRAVENOUS

## 2023-11-24 MED ORDER — LOSARTAN POTASSIUM 25 MG PO TABS
25.0000 mg | ORAL_TABLET | Freq: Every day | ORAL | Status: DC
  Administered 2023-11-25 (×2): 25 mg via ORAL
  Filled 2023-11-24: qty 1

## 2023-11-24 MED ORDER — ONDANSETRON HCL 4 MG/2ML IJ SOLN: 4.0000 mg | Freq: Four times a day (QID) | INTRAMUSCULAR | Status: DC | PRN

## 2023-11-24 MED ORDER — VERAPAMIL HCL 2.5 MG/ML IV SOLN
INTRAVENOUS | Status: AC
  Filled 2023-11-24: qty 2

## 2023-11-24 MED ORDER — HEPARIN SODIUM (PORCINE) 1000 UNIT/ML IJ SOLN
INTRAMUSCULAR | Status: DC | PRN
  Administered 2023-11-24 (×2): 3000 [IU] via INTRAVENOUS

## 2023-11-24 MED ORDER — SODIUM CHLORIDE 0.9 % IV SOLN: 250.0000 mL | INTRAVENOUS | Status: AC | PRN

## 2023-11-24 MED ORDER — HEPARIN SODIUM (PORCINE) 1000 UNIT/ML IJ SOLN
INTRAMUSCULAR | Status: AC
  Filled 2023-11-24: qty 10

## 2023-11-24 MED ORDER — HEPARIN (PORCINE) IN NACL 1000-0.9 UT/500ML-% IV SOLN
INTRAVENOUS | Status: DC | PRN
  Administered 2023-11-24 (×4): 500 mL

## 2023-11-24 MED ORDER — IOHEXOL 350 MG/ML SOLN
INTRAVENOUS | Status: DC | PRN
  Administered 2023-11-24 (×2): 40 mL

## 2023-11-24 MED ORDER — VERAPAMIL HCL 2.5 MG/ML IV SOLN
INTRAVENOUS | Status: DC | PRN
  Administered 2023-11-24 (×2): 10 mL via INTRA_ARTERIAL

## 2023-11-24 MED ORDER — LIDOCAINE HCL (PF) 1 % IJ SOLN
INTRAMUSCULAR | Status: AC
  Filled 2023-11-24: qty 30

## 2023-11-24 MED ORDER — FENTANYL CITRATE (PF) 100 MCG/2ML IJ SOLN
INTRAMUSCULAR | Status: AC
  Filled 2023-11-24: qty 2

## 2023-11-24 MED ORDER — FENTANYL CITRATE (PF) 100 MCG/2ML IJ SOLN
INTRAMUSCULAR | Status: DC | PRN
  Administered 2023-11-24 (×2): 25 ug via INTRAVENOUS

## 2023-11-24 MED ORDER — LABETALOL HCL 5 MG/ML IV SOLN: 10.0000 mg | INTRAVENOUS | Status: AC | PRN

## 2023-11-24 MED ORDER — LIDOCAINE HCL (PF) 1 % IJ SOLN
INTRAMUSCULAR | Status: DC | PRN
  Administered 2023-11-24: 5 mL
  Administered 2023-11-24 (×2): 2 mL
  Administered 2023-11-24: 5 mL

## 2023-11-24 MED ORDER — SODIUM CHLORIDE 0.9% FLUSH
3.0000 mL | Freq: Two times a day (BID) | INTRAVENOUS | Status: DC
  Administered 2023-11-24 – 2023-11-25 (×6): 3 mL via INTRAVENOUS

## 2023-11-24 SURGICAL SUPPLY — 7 items
CATH 5FR JL3.5 JR4 ANG PIG MP (CATHETERS) IMPLANT
CATH SWAN GANZ 7F STRAIGHT (CATHETERS) IMPLANT
GLIDESHEATH SLEND SS 6F .021 (SHEATH) IMPLANT
GLIDESHEATH SLENDER 7FR .021G (SHEATH) IMPLANT
GUIDEWIRE INQWIRE 1.5J.035X260 (WIRE) IMPLANT
KIT HEART LEFT (KITS) IMPLANT
PACK CARDIAC CATHETERIZATION (CUSTOM PROCEDURE TRAY) ×1 IMPLANT

## 2023-11-24 NOTE — Progress Notes (Signed)
 Advanced Heart Failure Rounding Note   Subjective:    Feels much better after diuresis. No further orthopnea. Can ambulate room  Cath today 11/24/23: Non-obstructive CAD. EF 35%  Ao = 102/67 (74) LV = 98/11 RA =  3 RV = 31/8 PA =  33/13 (20) PCW = 7 Fick cardiac output/index = 3.4/2.1 PVR = 3.8 WU Ao sat = 94% PA sat = 57%, 60% PAPi = 6.7   Objective:   Weight Range:  Vital Signs:   Temp:  [98.3 F (36.8 C)-99.1 F (37.3 C)] 98.3 F (36.8 C) (05/23 0453) Pulse Rate:  [96-108] 99 (05/22 1956) Resp:  [20-36] 20 (05/23 0453) BP: (104-146)/(62-96) 106/62 (05/23 0453) SpO2:  [94 %-100 %] 97 % (05/23 0739) Weight:  [57.5 kg] 57.5 kg (05/23 0453) Last BM Date : 11/23/23  Weight change: Filed Weights   11/23/23 0708 11/24/23 0453  Weight: 59 kg 57.5 kg    Intake/Output:   Intake/Output Summary (Last 24 hours) at 11/24/2023 0843 Last data filed at 11/24/2023 0604 Gross per 24 hour  Intake 6.53 ml  Output 2400 ml  Net -2393.47 ml     Physical Exam: General:  Lying flat. No resp difficulty HEENT: normal Neck: supple. JVP flat . Carotids 2+ bilat; no bruits. No lymphadenopathy or thryomegaly appreciated. Cor: Reg tachy + s3 Lungs: clear Abdomen: soft, nontender, nondistended. No hepatosplenomegaly. No bruits or masses. Good bowel sounds. Extremities: no cyanosis, clubbing, rash, edema Neuro: alert & orientedx3, cranial nerves grossly intact. moves all 4 extremities w/o difficulty. Affect pleasant  Telemetry: Sinus 90-105 Personally reviewed  Labs: Basic Metabolic Panel: Recent Labs  Lab 11/23/23 0715 11/23/23 1715 11/24/23 0546  NA 139 140 139  K 4.2 4.3 3.7  CL 109 103 103  CO2 22 23 27   GLUCOSE 104* 116* 98  BUN 9 9 20   CREATININE 1.08 1.31* 1.53*  CALCIUM  9.2 9.5 9.1    Liver Function Tests: Recent Labs  Lab 11/23/23 0715  AST 20  ALT 15  ALKPHOS 58  BILITOT 0.7  PROT 7.0  ALBUMIN 3.3*   Recent Labs  Lab 11/23/23 0715  LIPASE  30   No results for input(s): "AMMONIA" in the last 168 hours.  CBC: Recent Labs  Lab 11/23/23 0715 11/24/23 0546  WBC 13.7* 11.9*  NEUTROABS 9.4*  --   HGB 13.5 14.2  HCT 41.6 43.9  MCV 73.4* 72.4*  PLT 252 273    Cardiac Enzymes: No results for input(s): "CKTOTAL", "CKMB", "CKMBINDEX", "TROPONINI" in the last 168 hours.  BNP: BNP (last 3 results) Recent Labs    11/23/23 0829  BNP 2,481.6*    ProBNP (last 3 results) No results for input(s): "PROBNP" in the last 8760 hours.    Other results:  Imaging: CARDIAC CATHETERIZATION Result Date: 11/24/2023   Prox RCA lesion is 30% stenosed.   Mid RCA lesion is 50% stenosed.   Mid LM to Ost LAD lesion is 30% stenosed with 50% stenosed side branch in Ost Cx.   Mid LAD lesion is 30% stenosed.   The left ventricular ejection fraction is 25-35% by visual estimate. Findings: Ao = 102/67 (74) LV = 98/11 RA =  3 RV = 31/8 PA =  33/13 (20) PCW = 7 Fick cardiac output/index = 3.4/2.1 PVR = 3.8 WU Ao sat = 94% PA sat = 57%, 60% PAPi = 6.7 Assessment: 1. Diffuse non-obstructive CAD 2. Severe NICM EF ~ 25% 3. Normal filling pressures with mild PH and  moderate to severely reduced cardiac output Plan/Discussion: Continue to titrate GDMT. Hold diuretics. Check cMRI. Suspect potential LBBB CM Jules Oar, MD 8:43 AM  ECHOCARDIOGRAM COMPLETE Result Date: 11/23/2023    ECHOCARDIOGRAM REPORT   Patient Name:   Jesse Key Date of Exam: 11/23/2023 Medical Rec #:  [id number]      Height:       65.0 in Accession #:    [id number]     Weight:       130.0 lb Date of Birth:  August 25, 1965       BSA:          1.647 m Patient Age:    58 years       BP:           142/90 mmHg Patient Gender: M              HR:           105 bpm. Exam Location:  Inpatient Procedure: 2D Echo, Cardiac Doppler, Color Doppler and Intracardiac            Opacification Agent (Both Spectral and Color Flow Doppler were            utilized during procedure). Indications:     Dyspnea   History:         Patient has no prior history of Echocardiogram examinations.  Sonographer:     Gaston Karvonen, FE, PE Referring Phys:  [id number] Jarome Merritt RUMBALL Diagnosing Phys: Alwin Baars IMPRESSIONS  1. Left ventricular ejection fraction, by estimation, is <20%. The left ventricle has normal function. The left ventricle demonstrates global hypokinesis. The left ventricular internal cavity size was mildly dilated. Indeterminate diastolic filling due to E-A fusion. LVOT VTI of 7.85 with SV of 25cc concerning for low output heart failure.  2. Right ventricular systolic function is normal. The right ventricular size is normal.  3. Left atrial size was moderately dilated.  4. Right atrial size was mildly dilated.  5. The mitral valve is normal in structure. Mild to moderate mitral valve regurgitation.  6. The aortic valve is normal in structure. Aortic valve regurgitation is not visualized.  7. The inferior vena cava is normal in size with greater than 50% respiratory variability, suggesting right atrial pressure of 3 mmHg. FINDINGS  Left Ventricle: Left ventricular ejection fraction, by estimation, is <20%. The left ventricle has normal function. The left ventricle demonstrates global hypokinesis. Definity  contrast agent was given IV to delineate the left ventricular endocardial borders. The left ventricular internal cavity size was mildly dilated. There is no left ventricular hypertrophy. Abnormal (paradoxical) septal motion, consistent with left bundle branch block. Indeterminate diastolic filling due to E-A fusion. Right Ventricle: The right ventricular size is normal. No increase in right ventricular wall thickness. Right ventricular systolic function is normal. Left Atrium: Left atrial size was moderately dilated. Right Atrium: Right atrial size was mildly dilated. Pericardium: Trivial pericardial effusion is present. Mitral Valve: The mitral valve is normal in structure. Mild to moderate mitral valve  regurgitation. Tricuspid Valve: The tricuspid valve is normal in structure. Tricuspid valve regurgitation is trivial. Aortic Valve: The aortic valve is normal in structure. Aortic valve regurgitation is not visualized. Aortic valve mean gradient measures 2.0 mmHg. Aortic valve peak gradient measures 3.8 mmHg. Aortic valve area, by VTI measures 1.79 cm. Pulmonic Valve: The pulmonic valve was normal in structure. Pulmonic valve regurgitation is not visualized. Aorta: The aortic root is normal in size and structure. Venous: The inferior  vena cava is normal in size with greater than 50% respiratory variability, suggesting right atrial pressure of 3 mmHg. IAS/Shunts: No atrial level shunt detected by color flow Doppler.  LEFT VENTRICLE PLAX 2D LVIDd:         5.70 cm      Diastology LVIDs:         5.10 cm      LV e' medial:    6.74 cm/s LV PW:         1.00 cm      LV E/e' medial:  13.9 LV IVS:        0.80 cm      LV e' lateral:   8.27 cm/s LVOT diam:     2.00 cm      LV E/e' lateral: 11.3 LV SV:         25 LV SV Index:   15 LVOT Area:     3.14 cm  LV Volumes (MOD) LV vol d, MOD A2C: 159.0 ml LV vol d, MOD A4C: 209.0 ml LV vol s, MOD A2C: 124.0 ml LV vol s, MOD A4C: 169.0 ml LV SV MOD A2C:     35.0 ml LV SV MOD A4C:     209.0 ml LV SV MOD BP:      39.7 ml RIGHT VENTRICLE RV Basal diam:  4.40 cm RV Mid diam:    2.90 cm RV S prime:     9.25 cm/s TAPSE (M-mode): 1.6 cm LEFT ATRIUM             Index        RIGHT ATRIUM           Index LA diam:        4.20 cm 2.55 cm/m   RA Area:     19.00 cm LA Vol (A2C):   75.5 ml 45.84 ml/m  RA Volume:   55.30 ml  33.57 ml/m LA Vol (A4C):   72.2 ml 43.83 ml/m LA Biplane Vol: 74.2 ml 45.05 ml/m  AORTIC VALVE AV Area (Vmax):    1.75 cm AV Area (Vmean):   1.83 cm AV Area (VTI):     1.79 cm AV Vmax:           97.40 cm/s AV Vmean:          62.500 cm/s AV VTI:            0.138 m AV Peak Grad:      3.8 mmHg AV Mean Grad:      2.0 mmHg LVOT Vmax:         54.20 cm/s LVOT Vmean:         36.400 cm/s LVOT VTI:          0.078 m LVOT/AV VTI ratio: 0.57  AORTA Ao Root diam: 3.20 cm Ao Asc diam:  3.10 cm MITRAL VALVE               TRICUSPID VALVE MV Area (PHT): 5.09 cm    TR Peak grad:   43.8 mmHg MV Decel Time: 149 msec    TR Vmax:        331.00 cm/s MV E velocity: 93.40 cm/s                            SHUNTS  Systemic VTI:  0.08 m                            Systemic Diam: 2.00 cm Aditya Sabharwal Electronically signed by Alwin Baars Signature Date/Time: 11/23/2023/3:55:03 PM    Final (Updated)    CT Angio Chest Pulmonary Embolism (PE) W or WO Contrast Result Date: 11/23/2023 CLINICAL DATA:  Dyspnea, chronic, chest wall or pleura disease suspected EXAM: CT ANGIOGRAPHY CHEST WITH CONTRAST TECHNIQUE: Multidetector CT imaging of the chest was performed using the standard protocol during bolus administration of intravenous contrast. Multiplanar CT image reconstructions and MIPs were obtained to evaluate the vascular anatomy. RADIATION DOSE REDUCTION: This exam was performed according to the departmental dose-optimization program which includes automated exposure control, adjustment of the mA and/or kV according to patient size and/or use of iterative reconstruction technique. CONTRAST:  75mL OMNIPAQUE  IOHEXOL  350 MG/ML SOLN COMPARISON:  Nov 23, 2023 FINDINGS: Pulmonary Embolism: No pulmonary embolism. Cardiovascular: Mild cardiomegaly. Trace pericardial effusion. No aortic aneurysm. Scattered atherosclerosis in the descending aorta Mediastinum/Nodes: No mediastinal mass. No mediastinal, hilar, or axillary lymphadenopathy. Lungs/Pleura: The midline trachea and bronchi are patent. Biapical pleuroparenchymal scarring. Mild interlobular septal thickening within both lower lobes with hazy dependent ground-glass airspace opacities. Small bilateral pleural effusions. No pneumothorax. A small amount of fluid is also present within both oblique fissures, more so on the left than the  right. Musculoskeletal: No acute fracture or destructive bone lesion. Upper Abdomen: Likely small cyst in the upper pole of the left kidney. Reflux of contrast into the hepatic veins, suggesting underlying cardiac dysfunction. Review of the MIP images confirms the above findings. IMPRESSION: 1. No pulmonary embolism. 2. Mild cardiomegaly. Pulmonary findings suggestive of mild pulmonary edema with small bilateral pleural effusions. Aortic Atherosclerosis (ICD10-I70.0). Electronically Signed   By: Rance Burrows M.D.   On: 11/23/2023 15:17   DG Chest 2 View Result Date: 11/23/2023 CLINICAL DATA:  Shortness of breath. EXAM: CHEST - 2 VIEW COMPARISON:  April 09, 2017. FINDINGS: Mild cardiomegaly is noted with central pulmonary vascular congestion. Minimal bibasilar pulmonary edema is noted. Bony thorax is unremarkable. IMPRESSION: Mild cardiomegaly with central pulmonary vascular congestion. Minimal bibasilar pulmonary edema. Electronically Signed   By: Rosalene Colon M.D.   On: 11/23/2023 07:49     Medications:     Scheduled Medications:  dapagliflozin  propanediol  10 mg Oral Daily   digoxin   0.125 mg Oral Daily   [START ON 11/25/2023] enoxaparin  (LOVENOX ) injection  40 mg Subcutaneous Q24H   losartan   12.5 mg Oral Daily   sodium chloride  flush  3 mL Intravenous Q12H   spironolactone   12.5 mg Oral Daily    Infusions:  sodium chloride       PRN Medications: sodium chloride , acetaminophen , hydrALAZINE , labetalol , ondansetron  (ZOFRAN ) IV, sodium chloride  flush   Assessment/Plan:   1. Acute Systolic Heart Failure - new diagnosis - Echo EF < 20%, RV nl, global HK, mid-mod MR. LVOT VTI of 7.85 with SV of 25cc concerning for low output  heart failure  - Cath today 11/24/23: Non-obstructive CAD EF 25% Well compensated filling pressures with moderate to severely reduced CO (PCW 7, CI 2.07) - Continue losartan  12.5 mg daily  - Continue spironolactone  12.5 mg daily - Continue digoxin  0.125 mg   - Start Farxiga  - Stop lasix  - cMRI today. 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 Non-obstructive, diffuse CAD - ASA, statin - stop smoking  3. Iron  deficiency - IV iron  AFTER cMRI   4. HTN - known history though untreated - start HF GDMT per above  - reports h/o snoring, recommend outpatient sleep study    5. ETOH/Tobacco Use - drinks 2-3 16 oz beers/day, smokes 1/2 ppd - cessation advised    Length of Stay: 1   Jules Oar MD 11/24/2023, 8:43 AM  Advanced Heart Failure Team Pager [phone] (M-F; 7a - 4p)  Please contact [facility] for night-coverage after hours (4p -7a ) and weekends on [email or web address]

## 2023-11-24 NOTE — Plan of Care (Signed)
   Problem: Education: Goal: Knowledge of General Education information will improve Description Including pain rating scale, medication(s)/side effects and non-pharmacologic comfort measures Outcome: Progressing   Problem: Health Behavior/Discharge Planning: Goal: Ability to manage health-related needs will improve Outcome: Progressing

## 2023-11-24 NOTE — Evaluation (Signed)
 Physical Therapy Evaluation Patient Details Name: Jesse Key MRN: [id number] DOB: 03-22-1966 Today's Date: 11/24/2023  History of Present Illness  Patient is 58 y.o. male presented to ED 11/23/23 for dyspnea and some chest tightness. Pt found to be in acute onset of new CHF and EKG revealed LBBB. Echo reveals EF<20%. Plan for cMRI and Cardiac cath 11/24/23.  PMH significant for ETOH abuse, tobacco use.   Clinical Impression  Jesse Key is 58 y.o. male admitted with above HPI and diagnosis. Patient is currently limited by functional impairments below (see PT problem list). Patient lives alone and is independent at baseline. He has family and firend available for support as needed. Pt currently mobilizing at mod ind to supervision level for gait. Amb ~300' with no AD and no LOB noted. VSS throughout. Patient evaluated by Physical Therapy with no further acute PT needs identified. All education has been completed and the patient has no further questions. See below for any follow-up Physical Therapy or equipment needs. PT is signing off. Thank you for this referral.      If plan is discharge home, recommend the following:     Can travel by private vehicle        Equipment Recommendations None recommended by PT  Recommendations for Other Services       Functional Status Assessment Patient has had a recent decline in their functional status and demonstrates the ability to make significant improvements in function in a reasonable and predictable amount of time.     Precautions / Restrictions Precautions Precautions: Fall Recall of Precautions/Restrictions: Intact Precaution/Restrictions Comments: Rt radial cath Restrictions Weight Bearing Restrictions Per Provider Order: No Other Position/Activity Restrictions: Rt Radial cath 2/23 - no pushign/pulling/lifting with Rt UE      Mobility  Bed Mobility Overal bed mobility: Independent                  Transfers Overall  transfer level: Independent                 General transfer comment: cues to avoid use of Rt UE    Ambulation/Gait Ambulation/Gait assistance: Supervision Gait Distance (Feet): 400 Feet Assistive device: None Gait Pattern/deviations: Step-through pattern, WFL(Within Functional Limits) Gait velocity: fair     General Gait Details: steady with no overt LOB, HR in 100-110's. no SOB or lightheadedness.  Stairs            Wheelchair Mobility     Tilt Bed    Modified Rankin (Stroke Patients Only)       Balance Overall balance assessment: No apparent balance deficits (not formally assessed)    Pt demonstrated good SLS balacne of ~6 seconds on bil LE.                                        Pertinent Vitals/Pain Pain Assessment Pain Assessment: No/denies pain    Home Living Family/patient expects to be discharged to:: Private residence Living Arrangements: Alone Available Help at Discharge: Family;Friend(s) Type of Home: House Home Access: Stairs to enter Entrance Stairs-Rails: Right Entrance Stairs-Number of Steps: 1   Home Layout: One level Home Equipment: None      Prior Function Prior Level of Function : Independent/Modified Independent;Working/employed             Mobility Comments: ind no AD< works at [employer]  Upper Extremity Assessment Upper Extremity Assessment: Overall WFL for tasks assessed    Lower Extremity Assessment Lower Extremity Assessment: Overall WFL for tasks assessed    Cervical / Trunk Assessment Cervical / Trunk Assessment: Normal  Communication   Communication Communication: No apparent difficulties    Cognition Arousal: Alert Behavior During Therapy: WFL for tasks assessed/performed   PT - Cognitive impairments: No apparent impairments                         Following commands: Intact       Cueing Cueing Techniques: Verbal cues      General Comments      Exercises     Assessment/Plan    PT Assessment Patient does not need any further PT services  PT Problem List         PT Treatment Interventions      PT Goals (Current goals can be found in the Care Plan section)  Acute Rehab PT Goals Patient Stated Goal: recover and manage CHF PT Goal Formulation: All assessment and education complete, DC therapy Time For Goal Achievement: 11/25/23 Potential to Achieve Goals: Good    Frequency       Co-evaluation               AM-PAC PT "6 Clicks" Mobility  Outcome Measure Help needed turning from your back to your side while in a flat bed without using bedrails?: None Help needed moving from lying on your back to sitting on the side of a flat bed without using bedrails?: None Help needed moving to and from a bed to a chair (including a wheelchair)?: None Help needed standing up from a chair using your arms (e.g., wheelchair or bedside chair)?: None Help needed to walk in hospital room?: A Little Help needed climbing 3-5 steps with a railing? : A Little 6 Click Score: 22    End of Session Equipment Utilized During Treatment: Gait belt Activity Tolerance: Patient tolerated treatment well Patient left: in chair;with nursing/sitter in room (transport in room for MRI) Nurse Communication: Mobility status PT Visit Diagnosis: Difficulty in walking, not elsewhere classified (R26.2)    Time: 2956-2130 PT Time Calculation (min) (ACUTE ONLY): 15 min   Charges:   PT Evaluation $PT Eval Low Complexity: 1 Low   PT General Charges $$ ACUTE PT VISIT: 1 Visit         Tish Forge, DPT Acute Rehabilitation Services Office [phone]  11/24/23 3:24 PM

## 2023-11-24 NOTE — Progress Notes (Signed)
 OT Cancellation Note  Patient Details Name: Jesse Key MRN: [id number] DOB: 02-19-1966   Cancelled Treatment:    Reason Eval/Treat Not Completed: OT screened, no needs identified, will sign off (Discussed with PT)  Kitty Cadavid,HILLARY 11/24/2023, 4:07 PM Milburn Aliment, OT/L   Acute OT Clinical Specialist Acute Rehabilitation Services Pager [phone] Office [phone]

## 2023-11-24 NOTE — Progress Notes (Signed)
     Daily Progress Note Intern Pager: [phone]  Patient name: Jesse Key Medical record number: [id number] Date of birth: [birth year] Age: 58 y.o. Gender: male  Primary Care Provider: Pcp, No Consultants: Cardiology Code Status: Full  Pt Overview and Major Events to Date:  5/22: Admitted  Assessment and Plan:  This is a 58 year old male patient presenting with dyspnea in the setting of severe CHF exacerbation.  Cardiology was consulted on admission and patient is undergoing cardiac catheterization today. Assessment & Plan Heart failure [facility]) Cardiac cath this morning showed diffuse global nonobstructive coronary artery disease with mildly increased pulmonary pressures.  Plan for cardiac MRI later today.  Echocardiogram yesterday showed EF of approximately 25% -Cardiology following appreciate recommendations - Continuous cardiac monitoring - Strict I's and O's - Vitals per unit - PT/ OT - AM CBC, BMP - Cardiac MRI today per cardiology ETOH abuse Patient reports prolonged history of EtOH abuse.  Patient reports drinking 12 ounce x 2 cans of beer a day, sometimes more if watching game, for years.  Last drink was yesterday.  Will start CIWA's. - CIWA's q4h Elevated troponin Cath showed diffuse nonobstructive CAD, patient will undergo cardiac MRI today with cardiology Leukocytosis Stable, likely in the setting of cardiac disease.  Continue to monitor - AM CBC   FEN/GI: N.p.o. pending procedure PPx: Lovenox  held for procedure however Dispo:Pending PT recommendations  pending clinical improvement .   Subjective:  Patient feels well following cardiac cath.  Denies chest pain shortness of breath  Objective: Temp:  [98.3 F (36.8 C)-99.1 F (37.3 C)] 98.3 F (36.8 C) (05/23 0453) Pulse Rate:  [96-108] 99 (05/22 1956) Resp:  [20-36] 20 (05/23 0453) BP: (104-148)/(62-97) 106/62 (05/23 0453) SpO2:  [94 %-100 %] 97 % (05/23 0739) Weight:  [57.5 kg] 57.5 kg (05/23  0453) Physical Exam: General: Well-appearing, no distress Cardiovascular: RRR, no M/R/G Respiratory: CTAB, no increased work of breathing Abdomen: Flat, soft, nontender Extremities: No evidence of peripheral edema, 2+ pulses bilaterally  Laboratory: Most recent CBC Lab Results  Component Value Date   WBC 11.9 (H) 11/24/2023   HGB 14.2 11/24/2023   HCT 43.9 11/24/2023   MCV 72.4 (L) 11/24/2023   PLT 273 11/24/2023   Most recent BMP    Latest Ref Rng & Units 11/24/2023    5:46 AM  BMP  Glucose 70 - 99 mg/dL 98   BUN 6 - 20 mg/dL 20   Creatinine 1.47 - 1.24 mg/dL 8.29   Sodium 562 - 130 mmol/L 139   Potassium 3.5 - 5.1 mmol/L 3.7   Chloride 98 - 111 mmol/L 103   CO2 22 - 32 mmol/L 27   Calcium  8.9 - 10.3 mg/dL 9.1     Rayma Calandra, DO 11/24/2023, 7:55 AM  PGY-1, [facility] FPTS Intern pager: [phone], text pages welcome Secure chat group [facility] Teaching Service

## 2023-11-24 NOTE — Interval H&P Note (Signed)
 History and Physical Interval Note:  11/24/2023 7:49 AM  Jesse Key  has presented today for surgery, with the diagnosis of heart failure.  The various methods of treatment have been discussed with the patient and family. After consideration of risks, benefits and other options for treatment, the patient has consented to  Procedure(s): RIGHT/LEFT HEART CATH AND CORONARY ANGIOGRAPHY (N/A) as a surgical intervention.  The patient's history has been reviewed, patient examined, no change in status, stable for surgery.  I have reviewed the patient's chart and labs.  Questions were answered to the patient's satisfaction.     [name]

## 2023-11-24 NOTE — Assessment & Plan Note (Signed)
 Patient reports prolonged history of EtOH abuse.  Patient reports drinking 12 ounce x 2 cans of beer a day, sometimes more if watching game, for years.  Last drink was yesterday.  Will start CIWA's. - CIWA's q4h

## 2023-11-24 NOTE — Progress Notes (Signed)
 Heart Failure Navigator Progress Note  Assessed for Heart & Vascular TOC clinic readiness.  Patient does not meet criteria due to advanced heart failure team has been consulted.   Navigator will sign off.   Jerilyn Monte, PharmD, BCPS Heart Failure Stewardship Pharmacist Phone [phone]

## 2023-11-24 NOTE — Assessment & Plan Note (Addendum)
 Stable, likely in the setting of cardiac disease.  Continue to monitor - AM CBC

## 2023-11-24 NOTE — Hospital Course (Addendum)
 Jesse Key is a 58 y.o.male with a history of asthma who was admitted to the [facility] Teaching Service at [facility] for dyspnea. His hospital course is detailed below:  NICM likely 2/2 LBBB  HFrEF, new onset  Non-obstructive diffuse CAD Patient comes in with 2 days of dyspnea, thought to be asthma exacerbation, found to have acute new onset CHF exacerbation.  Patient with BNP of 2481, with cardiomegaly, and pulmonary congestion, and mild bibasilar pulmonary edema on chest x-ray, but no edema on extremities. Echocardiogram showed LVEF <20%, LV global hypokinesis, LV/LA/RA dilation. Left/right cardiac catheterization performed showing diffuse non-obstructive CAD with severe NICM EF ~25% and normal filling pressures.  Cardiology was consulted.  Cardiology titrated GDMT, held diuretics, and checked cMRI. cMRI showed LBBB with severe septal-lateral hypokinesis, some concern for elevated myocardial fibrotic content.  Cardiologist favored NICM 2/2 LBBB.  Patient was started on rosuvastatin  20 mg, losartan  12.5 mg, spironolactone  12.5 mg, digoxin  0.125 mg, and Jardiance  10 mg (Farxiga  not covered by insurance).  Diuresed net 3.5 L with Lasix  and discharged without oral diuretics.  Set up with cardiology outpatient on 12/04/2023 with plan for CRT in 3 months if EF not improving.  Alcohol / tobacco use Drinks 2 beers a day and up to 12 pack during games. Smokes 1 PPD for 15 years. Discussed with patient stopping these habits or at least cutting back, especially smoking. Patient considering cessation.  Elevated troponin Elevated troponin suspected to be demand ischemia and found to have LBBB on EKG but does not meet Sgarbossa criteria. Given LVEF 20%, got L/R heart cath as above.  Iron  deficiency Normal hgb but micorcytic on labs. Cardiology recommended IDA workup. Normal ferritin, low iron , low saturation, normal TIBC.  IV iron  was administered x1 dose per Cardiology after cMRI given cardiac  comorbidities.  Asthma Chronic, stable, last flare was a year ago. Did not require albuterol  in hospital. Discharged on room air.  PCP Follow-up Recommendations: Please repeat BMP in 1 week given new medications Tobacco and alcohol cessation will be crucial Cardiology follow up on 6/2 @0900  OSA sleep study recommended by cardiology, recommend arranging

## 2023-11-24 NOTE — Plan of Care (Signed)
  Problem: Education: Goal: Knowledge of General Education information will improve Description: Including pain rating scale, medication(s)/side effects and non-pharmacologic comfort measures 11/24/2023 1221 by Corlis Dienes, RN Outcome: Progressing 11/24/2023 1220 by Corlis Dienes, RN Outcome: Progressing   Problem: Health Behavior/Discharge Planning: Goal: Ability to manage health-related needs will improve 11/24/2023 1221 by Corlis Dienes, RN Outcome: Progressing 11/24/2023 1220 by Corlis Dienes, RN Outcome: Progressing

## 2023-11-24 NOTE — TOC Initial Note (Addendum)
 Transition of Care [facility]) - Initial/Assessment Note    Patient Details  Name: Jesse Key MRN: [id number] Date of Birth: 1966-05-31  Transition of Care [facility]) CM/SW Contact:    Ernst Heap Phone Number: [phone] 11/24/2023, 1:34 PM  Clinical Narrative:   HF CSW met with patient, two sisters, and brother in law at bedside. Patient stated that he lives in a motel. Patient stated that he does not have a history of HH services. Patient stated that he does not use any equipment. Patient stated that at this time he does not have any equipment. Patient stated that he does not drive and normally walks or ride the bus to get around. Patient does not work. Patient does not have a scale. Patient does not have a PCP. CSW explained that a hospital follow up appointment will be scheduled closer towards dc. Patient agrees. Patient stated that he may need transportation assistance at dc.   Hospital follow up appointment scheduled for Monday, January 15, 2024 at 1:30 PM.   TOC will continue following.             Expected Discharge Plan: Home/Self Care     Patient Goals and CMS Choice            Expected Discharge Plan and Services       Living arrangements for the past 2 months: Hotel/Motel                                      Prior Living Arrangements/Services Living arrangements for the past 2 months: Hotel/Motel Lives with:: Self Patient language and need for interpreter reviewed:: Yes Do you feel safe going back to the place where you live?: Yes      Need for Family Participation in Patient Care: No (Comment) Care giver support system in place?: No (comment)   Criminal Activity/Legal Involvement Pertinent to Current Situation/Hospitalization: No - Comment as needed  Activities of Daily Living   ADL Screening (condition at time of admission) Independently performs ADLs?: Yes (appropriate for developmental age) Is the patient deaf or have difficulty hearing?:  No Does the patient have difficulty seeing, even when wearing glasses/contacts?: No Does the patient have difficulty concentrating, remembering, or making decisions?: No  Permission Sought/Granted                  Emotional Assessment Appearance:: Appears younger than stated age Attitude/Demeanor/Rapport: Engaged Affect (typically observed): Appropriate Orientation: : Oriented to Self, Oriented to Place, Oriented to  Time, Oriented to Situation Alcohol / Substance Use: Not Applicable Psych Involvement: No (comment)  Admission diagnosis:  Shortness of breath [R06.02] Acute pulmonary edema (HCC) [J81.0] Heart failure (HCC) [I50.9] Patient Active Problem List   Diagnosis Date Noted   Heart failure (HCC) 11/23/2023   ETOH abuse 11/23/2023   Elevated troponin 11/23/2023   Leukocytosis 11/23/2023   Acute pulmonary edema (HCC) 11/23/2023   PCP:  Pcp, No Pharmacy:   [id number] [place], [place] - [address] [place] [address] Phone: [phone] Fax: [phone]     Social Drivers of Health (SDOH) Social History: SDOH Screenings   Food Insecurity: No Food Insecurity (11/24/2023)  Housing: Low Risk  (11/24/2023)  Transportation Needs: No Transportation Needs (11/24/2023)  Utilities: Not At Risk (11/24/2023)   SDOH Interventions:     Readmission Risk Interventions     No data to display

## 2023-11-24 NOTE — Plan of Care (Signed)

## 2023-11-24 NOTE — TOC Benefit Eligibility Note (Signed)
 Pharmacy Patient Advocate Encounter   Received notification from Inpatient Request that prior authorization for Farxiga  10mg  is required/requested.   Insurance verification completed.   The patient is insured through [facility] .   Per test claim: PA required; PA submitted to above mentioned insurance via CoverMyMeds Key/confirmation #/EOC [id number] Status is pending    Received notification from [facility] that Prior Authorization for Farxiga  10mg  has been APPROVED from 11/24/23 to 11/23/24. Ran test claim, Copay is $4.00. This test claim was processed through [facility]- copay amounts may vary at other pharmacies due to pharmacy/plan contracts, or as the patient moves through the different stages of their insurance plan.   PA #/Case ID/Reference #: [id number]

## 2023-11-24 NOTE — Assessment & Plan Note (Addendum)
 Cath showed diffuse nonobstructive CAD, patient will undergo cardiac MRI today with cardiology

## 2023-11-24 NOTE — TOC Benefit Eligibility Note (Signed)
 Insurance verification completed.    The patient is insured through [facility].     Ran test claim for Farxiga  10mg  and the current 30 day co-pay is prior authorization required.  Ran test claim for Jardiance  10mg  and the current 30 day co-pay is prior authorization required.   This test claim was processed through [facility]- copay amounts may vary at other pharmacies due to pharmacy/plan contracts, or as the patient moves through the different stages of their insurance plan.

## 2023-11-24 NOTE — Discharge Instructions (Addendum)
 Dear Jesse Key,  Thank you for letting us  participate in your care. You were hospitalized for worsening breathing and diagnosed with Heart failure (HCC). You were treated with diuretics (to make you pee), imaging and catheter to look at your heart function, started on heart failure medications (to improve symptoms and long term outcomes), and were set up with cardiology outpatient for follow up.   POST-HOSPITAL & CARE INSTRUCTIONS Stop smoking. You can ask your doctor for way to help with quitting. You can call the quit line at 1-800-QUIT-NOW (765-459-1263). There is a packet attached with steps to quit.  Review the attached reference about Heart Failure including a QR code for a video.  The cardiology appointment on 6/2/2-25 is at the hospital. There is plenty of free parking around the hospital. You need to go to Entrance C, which you can put into google maps app.  If you have any issues with costs of medications, please reach out to your doctor as soon as possible.  Go to your follow up appointments (listed below)   DOCTOR'S APPOINTMENT   Future Appointments  Date Time Provider Department Center  12/04/2023  9:00 AM [facility] PA/NP [facility] None  01/15/2024  1:30 PM Jesse Siemens, NP [facility] None    Follow-up Information     [facility] Follow up on 12/04/2023.   Specialty: Cardiology Why: at [facility], Entrance C Contact information: [address] [place]  [address] [phone]        Jesse Siemens, NP. Go in 49 day(s).   Specialty: Internal Medicine Why: Hospital follow up appointment scheduled for Monday, January 15, 2024 at 1:30 PM.  PLEASE ARRIVE 10-15 minutes early.  PLEASE call to cancel/reschedule if you CANNOT make appointment. Contact information: [address] [place] [address] [phone]                 Take care and be well!  Family Medicine Teaching  Service Inpatient Team [place]  [facility]  [address] [place], [place] [address] [phone]

## 2023-11-24 NOTE — Assessment & Plan Note (Addendum)
 Cardiac cath this morning showed diffuse global nonobstructive coronary artery disease with mildly increased pulmonary pressures.  Plan for cardiac MRI later today.  Echocardiogram yesterday showed EF of approximately 25% -Cardiology following appreciate recommendations - Continuous cardiac monitoring - Strict I's and O's - Vitals per unit - PT/ OT - AM CBC, BMP - Cardiac MRI today per cardiology

## 2023-11-25 ENCOUNTER — Other Ambulatory Visit (HOSPITAL_COMMUNITY): Payer: Self-pay

## 2023-11-25 DIAGNOSIS — R0602 Shortness of breath: Secondary | ICD-10-CM

## 2023-11-25 DIAGNOSIS — I509 Heart failure, unspecified: Secondary | ICD-10-CM | POA: Diagnosis not present

## 2023-11-25 DIAGNOSIS — I10 Essential (primary) hypertension: Secondary | ICD-10-CM | POA: Diagnosis not present

## 2023-11-25 DIAGNOSIS — F1010 Alcohol abuse, uncomplicated: Secondary | ICD-10-CM | POA: Diagnosis not present

## 2023-11-25 LAB — CBC WITH DIFFERENTIAL/PLATELET
Abs Immature Granulocytes: 0.05 K/uL (ref 0.00–0.07)
Basophils Absolute: 0 K/uL (ref 0.0–0.1)
Basophils Relative: 0 %
Eosinophils Absolute: 0.2 K/uL (ref 0.0–0.5)
Eosinophils Relative: 2 %
HCT: 47.6 % (ref 39.0–52.0)
Hemoglobin: 15.1 g/dL (ref 13.0–17.0)
Immature Granulocytes: 0 %
Lymphocytes Relative: 27 %
Lymphs Abs: 3.1 K/uL (ref 0.7–4.0)
MCH: 23.1 pg — ABNORMAL LOW (ref 26.0–34.0)
MCHC: 31.7 g/dL (ref 30.0–36.0)
MCV: 72.7 fL — ABNORMAL LOW (ref 80.0–100.0)
Monocytes Absolute: 1 K/uL (ref 0.1–1.0)
Monocytes Relative: 9 %
Neutro Abs: 6.8 K/uL (ref 1.7–7.7)
Neutrophils Relative %: 62 %
Platelets: 287 K/uL (ref 150–400)
RBC: 6.55 MIL/uL — ABNORMAL HIGH (ref 4.22–5.81)
RDW: 15.8 % — ABNORMAL HIGH (ref 11.5–15.5)
WBC: 11.1 K/uL — ABNORMAL HIGH (ref 4.0–10.5)
nRBC: 0 % (ref 0.0–0.2)

## 2023-11-25 LAB — BASIC METABOLIC PANEL WITH GFR
Anion gap: 7 (ref 5–15)
BUN: 26 mg/dL — ABNORMAL HIGH (ref 6–20)
CO2: 22 mmol/L (ref 22–32)
Calcium: 8.7 mg/dL — ABNORMAL LOW (ref 8.9–10.3)
Chloride: 105 mmol/L (ref 98–111)
Creatinine, Ser: 1.48 mg/dL — ABNORMAL HIGH (ref 0.61–1.24)
GFR, Estimated: 54 mL/min — ABNORMAL LOW (ref >60)
Glucose, Bld: 99 mg/dL (ref 70–99)
Potassium: 4 mmol/L (ref 3.5–5.1)
Sodium: 134 mmol/L — ABNORMAL LOW (ref 135–145)

## 2023-11-25 LAB — MAGNESIUM: Magnesium: 2.1 mg/dL (ref 1.7–2.4)

## 2023-11-25 MED ORDER — LOSARTAN POTASSIUM 25 MG PO TABS
25.0000 mg | ORAL_TABLET | Freq: Every day | ORAL | 0 refills | Status: DC
  Filled 2023-11-25: qty 30, 30d supply, fill #0

## 2023-11-25 MED ORDER — ALBUTEROL SULFATE HFA 108 (90 BASE) MCG/ACT IN AERS
2.0000 | INHALATION_SPRAY | Freq: Four times a day (QID) | RESPIRATORY_TRACT | 0 refills | Status: DC | PRN
  Filled 2023-11-25: qty 6.7, 25d supply, fill #0

## 2023-11-25 MED ORDER — DIGOXIN 125 MCG PO TABS
0.1250 mg | ORAL_TABLET | Freq: Every day | ORAL | 0 refills | Status: DC
  Filled 2023-11-25: qty 30, 30d supply, fill #0

## 2023-11-25 MED ORDER — ROSUVASTATIN CALCIUM 20 MG PO TABS
20.0000 mg | ORAL_TABLET | Freq: Every day | ORAL | 0 refills | Status: DC
  Filled 2023-11-25: qty 30, 30d supply, fill #0

## 2023-11-25 MED ORDER — DAPAGLIFLOZIN PROPANEDIOL 10 MG PO TABS
10.0000 mg | ORAL_TABLET | Freq: Every day | ORAL | 0 refills | Status: DC
  Filled 2023-11-25: qty 30, 30d supply, fill #0

## 2023-11-25 MED ORDER — EMPAGLIFLOZIN 10 MG PO TABS
10.0000 mg | ORAL_TABLET | Freq: Every day | ORAL | 0 refills | Status: DC
  Filled 2023-11-25: qty 30, 30d supply, fill #0

## 2023-11-25 MED ORDER — ASPIRIN 81 MG PO TBEC
81.0000 mg | DELAYED_RELEASE_TABLET | Freq: Every day | ORAL | 12 refills | Status: DC
  Filled 2023-11-25: qty 30, 30d supply, fill #0

## 2023-11-25 MED ORDER — SPIRONOLACTONE 25 MG PO TABS
12.5000 mg | ORAL_TABLET | Freq: Every day | ORAL | 0 refills | Status: DC
  Filled 2023-11-25: qty 15, 30d supply, fill #0

## 2023-11-25 MED ORDER — IRON SUCROSE 200 MG IVPB - SIMPLE MED
200.0000 mg | Freq: Once | Status: DC
  Filled 2023-11-25: qty 110

## 2023-11-25 MED ORDER — SODIUM CHLORIDE 0.9 % IV SOLN
200.0000 mg | Freq: Once | INTRAVENOUS | Status: AC
  Administered 2023-11-25 (×2): 200 mg via INTRAVENOUS
  Filled 2023-11-25: qty 10

## 2023-11-25 NOTE — Plan of Care (Signed)
  Problem: Pain Managment: Goal: General experience of comfort will improve and/or be controlled Outcome: Progressing   Problem: Safety: Goal: Ability to remain free from injury will improve Outcome: Progressing   Problem: Skin Integrity: Goal: Risk for impaired skin integrity will decrease Outcome: Progressing

## 2023-11-25 NOTE — Progress Notes (Signed)
 DISCHARGE NOTE HOME [place] to be discharged Home per MD order. Discussed prescriptions and follow up appointments with the patient. Prescriptions given to patient; medication list explained in detail. Patient verbalized understanding.  Skin clean, dry and intact without evidence of skin break down, no evidence of skin tears noted. IV catheter discontinued intact. Site without signs and symptoms of complications. Dressing and pressure applied. Pt denies pain at the site currently. No complaints noted.  Patient free of lines, drains, and wounds.   An After Visit Summary (AVS) was printed and given to the patient. Patient escorted via wheelchair, and discharged home via private auto.  Tonda Francisco, RN

## 2023-11-25 NOTE — Discharge Summary (Addendum)
 Family Medicine Teaching [facility] Discharge Summary  Patient name: Jesse Key Medical record number: [id number] Date of birth: 1966-01-03 Age: 58 y.o. Gender: male Date of Admission: 11/23/2023  Date of Discharge: 11/25/2023 Admitting Physician: Kandis Ormond, DO  Primary Care Provider: Marius Siemens, NP Consultants: Cardiology  Indication for Hospitalization: Persistent dyspnea  Discharge Diagnoses/Problem List:  Principal Problem for Admission: Acute onset heart failure Other Problems addressed during stay:  Principal Problem:   Heart failure (HCC) Active Problems:   ETOH abuse   Elevated troponin   Leukocytosis   Acute pulmonary edema (HCC)   Primary hypertension   Shortness of breath   Brief Hospital Course:  Jesse Key is a 58 y.o.male with a history of asthma who was admitted to the [facility] Medicine Teaching Service at [facility] for dyspnea. His hospital course is detailed below:  NICM likely 2/2 LBBB  HFrEF, new onset  Non-obstructive diffuse CAD Patient comes in with 2 days of dyspnea, thought to be asthma exacerbation, found to have acute new onset CHF exacerbation.  Patient with BNP of 2481, with cardiomegaly, and pulmonary congestion, and mild bibasilar pulmonary edema on chest x-ray, but no edema on extremities. Echocardiogram showed LVEF <20%, LV global hypokinesis, LV/LA/RA dilation. Left/right cardiac catheterization performed showing diffuse non-obstructive CAD with severe NICM EF ~25% and normal filling pressures.  Cardiology was consulted.  Cardiology titrated GDMT, held diuretics, and checked cMRI. cMRI showed LBBB with severe septal-lateral hypokinesis, some concern for elevated myocardial fibrotic content.  Cardiologist favored NICM 2/2 LBBB.  Patient was started on rosuvastatin  20 mg, losartan  12.5 mg, spironolactone  12.5 mg, digoxin  0.125 mg, and Jardiance  10 mg (Farxiga  not covered by insurance).  Diuresed net 3.5 L with Lasix  and  discharged without oral diuretics.  Set up with cardiology outpatient on 12/04/2023 with plan for CRT in 3 months if EF not improving.  Alcohol / tobacco use Drinks 2 beers a day and up to 12 pack during games. Smokes 1 PPD for 15 years. Discussed with patient stopping these habits or at least cutting back, especially smoking. Patient considering cessation.  Elevated troponin Elevated troponin suspected to be demand ischemia and found to have LBBB on EKG but does not meet Sgarbossa criteria. Given LVEF 20%, got L/R heart cath as above.  Iron  deficiency Normal hgb but micorcytic on labs. Cardiology recommended IDA workup. Normal ferritin, low iron , low saturation, normal TIBC.  IV iron  was administered x1 dose per Cardiology after cMRI given cardiac comorbidities.  Asthma Chronic, stable, last flare was a year ago. Did not require albuterol  in hospital. Discharged on room air.  PCP Follow-up Recommendations: Please repeat BMP in 1 week given new medications Tobacco and alcohol cessation will be crucial Cardiology follow up on 6/2 @0900  OSA sleep study recommended by cardiology, recommend arranging  Disposition: Home on GDMT w/ Cardiology follow up  Discharge Condition: Stable  Discharge Exam:  Vitals:   11/25/23 0815 11/25/23 1134  BP: 117/74 115/77  Pulse: 80 89  Resp: 17 18  Temp: 98 F (36.7 C) 98.5 F (36.9 C)  SpO2: 100% 96%   Physical Exam: General: Resting in bed in NAD. Eyes: No scleral icterus or injections. ENTM: MMM. Neck: No JVD. Cardiovascular: RRR.  Somewhat diminished heart sounds.  No murmurs/rubs. Respiratory: CTAB.  Normal work of breathing on room air. Gastrointestinal: No tenderness to palpation globally. MSK: Thin, but moving all extremities appropriately. Extremities: No peripheral edema bilaterally.  Cap refill less than 2 seconds. Neuro: Alert  and oriented x4.  Significant Procedures: L/R heart cath 5/22: Diffuse non-obstructive CAD. Severe NICM EF  ~ 25. Normal filling pressures with mild PH and moderate to severely reduced cardiac output.  cMRI 5/23: Moderately dilated LV with septal-lateral dyssynchrony consistent with LBBB and diffuse severe hypokinesis. LV EF 18%. Normal RV size with RV EF 35%. Suggestion of elevated myocardial fibrotic content.  Significant Labs and Imaging:  Recent Labs  Lab 11/24/23 0546 11/24/23 0805 11/24/23 0810 11/24/23 0920 11/25/23 0443  WBC 11.9*  --   --  11.3* 11.1*  HGB 14.2   < > 16.3 14.8 15.1  HCT 43.9   < > 48.0 46.1 47.6  PLT 273  --   --  294 287   < > = values in this interval not displayed.   Recent Labs  Lab 11/24/23 0546 11/24/23 0805 11/24/23 0809 11/24/23 0810 11/24/23 0920 11/25/23 0443  NA 139 142 144 151*  --  134*  K 3.7 3.7 2.2* 3.7  --  4.0  CL 103  --   --   --   --  105  CO2 27  --   --   --   --  22  GLUCOSE 98  --   --   --   --  99  BUN 20  --   --   --   --  26*  CREATININE 1.53*  --   --   --  1.50* 1.48*  CALCIUM  9.1  --   --   --   --  8.7*  MG  --   --   --   --   --  2.1    - Troponin: 45>55 - BNP: 2481.6  Results/Tests Pending at Time of Discharge: Unresulted Labs (From admission, onward)     Start     Ordered   12/01/23 0500  Creatinine, serum  (enoxaparin  (LOVENOX )    CrCl >/= 30 ml/min)  Weekly,   R     Comments: while on enoxaparin  therapy   Question:  Specimen collection method  Answer:  Lab=Lab collect   11/24/23 0840            Discharge Medications:  Allergies as of 11/25/2023       Reactions   Shellfish Allergy Itching        Medication List     TAKE these medications    albuterol  108 (90 Base) MCG/ACT inhaler Commonly known as: VENTOLIN  HFA Inhale 2 puffs into the lungs every 6 (six) hours as needed for wheezing or shortness of breath.   aspirin  EC 81 MG tablet Take 1 tablet (81 mg total) by mouth daily. Swallow whole.   cyclobenzaprine 10 MG tablet Commonly known as: FLEXERIL Take 10 mg by mouth 2 (two) times  daily as needed.   digoxin  0.125 MG tablet Commonly known as: LANOXIN  Take 1 tablet (0.125 mg total) by mouth daily.   Jardiance  10 MG Tabs tablet Generic drug: empagliflozin  Take 1 tablet (10 mg total) by mouth daily.   losartan  25 MG tablet Commonly known as: COZAAR  Take 1 tablet (25 mg total) by mouth daily.   rosuvastatin  20 MG tablet Commonly known as: CRESTOR  Take 1 tablet (20 mg total) by mouth daily.   spironolactone  25 MG tablet Commonly known as: ALDACTONE  Take 0.5 tablets (12.5 mg total) by mouth daily.        Discharge Instructions: Please refer to Patient Instructions section of EMR for full details.  Patient  was counseled important signs and symptoms that should prompt return to medical care, changes in medications, dietary instructions, activity restrictions, and follow up appointments.   Follow-Up Appointments: Future Appointments  Date Time Provider Department Center  12/04/2023  9:00 AM [facility] PA/NP [facility] None  01/15/2024  1:30 PM Marius Siemens, NP [facility] None    [place] Planas, Brentin Shin, MD 11/25/2023, 7:35 PM PGY-1, [facility]

## 2023-11-25 NOTE — Plan of Care (Signed)
   Problem: Education: Goal: Knowledge of General Education information will improve Description Including pain rating scale, medication(s)/side effects and non-pharmacologic comfort measures Outcome: Progressing

## 2023-11-25 NOTE — Progress Notes (Signed)
   Patient Name: Jesse Key Date of Encounter: 11/25/2023 [facility] Cardiologist: None Bensimhon  Interval Summary  .    Feels well, ready to go home.   Vital Signs .    Vitals:   11/24/23 2008 11/25/23 0015 11/25/23 0453 11/25/23 0815  BP: 120/87 121/78 115/78 117/74  Pulse: 95 81 82 80  Resp: 20 19 20 17   Temp: (!) 97.5 F (36.4 C) 98.1 F (36.7 C) 98.2 F (36.8 C) 98 F (36.7 C)  TempSrc: Oral Oral Oral Oral  SpO2: 99% 97% 96% 100%  Weight:   57.3 kg   Height:        Intake/Output Summary (Last 24 hours) at 11/25/2023 1116 Last data filed at 11/25/2023 6045 Gross per 24 hour  Intake 479 ml  Output 2100 ml  Net -1621 ml      11/25/2023    4:53 AM 11/24/2023    4:53 AM 11/23/2023    7:08 AM  Last 3 Weights  Weight (lbs) 126 lb 4.8 oz 126 lb 12.8 oz 130 lb  Weight (kg) 57.289 kg 57.516 kg 58.968 kg      Telemetry/ECG    SR, LBBB - Personally Reviewed  Physical Exam .   GEN: No acute distress.   Neck: No JVD Cardiac: RRR, no murmurs, rubs, or gallops.  Respiratory: Clear to auscultation bilaterally. GI: Soft, nontender, non-distended  MS: No edema  Assessment & Plan .     1. Acute Systolic Heart Failure - new diagnosis - Echo EF < 20%, RV nl, global HK, mid-mod MR. LVOT VTI of 7.85 with SV of 25cc concerning for low output  heart failure  - Cath today 11/24/23: Non-obstructive CAD EF 25% Well compensated filling pressures with moderate to severely reduced CO (PCW 7, CI 2.07) - Continue losartan  12.5 mg daily  - Continue spironolactone  12.5 mg daily - Continue digoxin  0.125 mg  - Farxiga  10 mg daily, continue - lasix  has been stopped, pt would prefer to have as needed lasix  at home, ok to give Rx for lasix  20 mg po PRN swelling/sob - cMRI showed moderately dilated LV with LBBB and EF 18%. RV moderately reduced function with normal size. Nonspecific pattern of LGE with increased myocardial fibrotic content. All together suggests NICM possibly  2/2 to LBBB. - stable for hospital dc today from CV perspective. Has close f/u.   2. Non-obstructive, diffuse CAD - ASA, statin - stop smoking   3. Iron  deficiency  - recommend IV iron  prior to DC, now that MRI has been completed.   4. HTN - known history though untreated - start HF GDMT per above  - reports h/o snoring, recommend outpatient sleep study    5. ETOH/Tobacco Use - drinks 2-3 16 oz beers/day, smokes 1/2 ppd - cessation advised - pt requesting inhaler at discharge.  For questions or updates, please contact [facility] Please consult [email or web address] for contact info under        Signed, Jesse Key Jesse Key Jesse Dunson, MD

## 2023-11-25 NOTE — Progress Notes (Signed)
TOC meds delivered to bedside.

## 2023-11-28 ENCOUNTER — Telehealth (INDEPENDENT_AMBULATORY_CARE_PROVIDER_SITE_OTHER): Payer: Self-pay

## 2023-11-28 NOTE — Telephone Encounter (Signed)
 Copied from CRM [id number]. Topic: Appointments - Scheduling Inquiry for Clinic >> Nov 24, 2023  1:44 PM DeAngela L wrote: Reason for CRM: Karel Osler calling with transition of care department [phone] [facility] to help the patient schedule hospital f/u for July 14 as a new pt also and they would like to ask if the patient could go on a waiting list for cancelled appt To be seen sooner Please call pt to schedule different appt time [phone] (M)

## 2023-11-28 NOTE — Progress Notes (Signed)
 ADVANCED HF CLINIC CONSULT NOTE  Referring Physician: No primary care provider on file. Primary Care: No primary care provider on file. Primary Cardiologist: None AHF Cardiologist: Jules Oar, MD  Chief Complaint: Heart failure HPI: Jesse Key is a 58 y.o. AA male w/ no prior cardiac history. No routine medical care. Was told once before that he had high blood pressure when trying to donate plasma. He said he was told his BP was in the 200s but he did not seek any medical care. Active smoker, smoking since age 75, 1/2 ppd. Drinks daily, 2-3 16 oz beers/day. Denies any other substance use. No known family h/o heart disease or SCD.   Admitted 5/25 with acute systolic heart failure. BNP 2.4K, CXR with pulmonary edema. EKG with ST and atypical LBBB. HsTrop flat. Echo showed EF < 20%, RV nl, global HK, mid-mod MR. LVOT VTI of 7.85 with SV of 25cc concerning for low output heart failure. Diuresed well with IV lasix  and he was started on GDMT. Cath 11/24/23: Non-obstructive CAD, EF 25%, well compensated filling pressures with moderate to severely reduced CO (PCW 7, CI 2.07). cMRI prior to discharge showed 5/25 with EF 18%, mod dilated LV with septal-lateral dyssynchrony consistent with LBBB and diffuse severe HK. Normal RV, RV EF 35%. Mild-mod MR. Concern for LBBB CM. Discharged on GDMT and close f/u.   Today he presents for post hospital follow up. Overall feeling ok. Denies palpitations, CP,  edema, or PND/Orthopnea. Dizziness with abrupt movements. SOB with exertion. Appetite ok, tries to watch what he eats, minimal salt use. No fever or chills. Does not weight at home. Taking all medications. Smokes minimally d/t SOB. Has not drank ETOH since discharged.   No past medical history on file.  Current Outpatient Medications  Medication Sig Dispense Refill   albuterol  (VENTOLIN  HFA) 108 (90 Base) MCG/ACT inhaler Inhale 2 puffs into the lungs every 6 (six) hours as needed for wheezing or  shortness of breath. 6.7 g 0   aspirin  EC 81 MG tablet Take 1 tablet (81 mg total) by mouth daily. Swallow whole. 30 tablet 12   cyclobenzaprine (FLEXERIL) 10 MG tablet Take 10 mg by mouth 2 (two) times daily as needed.     digoxin  (LANOXIN ) 0.125 MG tablet Take 1 tablet (0.125 mg total) by mouth daily. 30 tablet 0   empagliflozin  (JARDIANCE ) 10 MG TABS tablet Take 1 tablet (10 mg total) by mouth daily. 30 tablet 0   furosemide  (LASIX ) 20 MG tablet Take 1 tablet (20 mg total) by mouth as needed. 30 tablet 3   losartan  (COZAAR ) 25 MG tablet Take 1 tablet (25 mg total) by mouth daily. 30 tablet 0   rosuvastatin  (CRESTOR ) 20 MG tablet Take 1 tablet (20 mg total) by mouth daily. 30 tablet 0   spironolactone  (ALDACTONE ) 25 MG tablet Take 0.5 tablets (12.5 mg total) by mouth daily. 30 tablet 0   No current facility-administered medications for this encounter.    Allergies  Allergen Reactions   Shellfish Allergy Itching      Social History   Socioeconomic History   Marital status: Single    Spouse name: Not on file   Number of children: Not on file   Years of education: Not on file   Highest education level: Not on file  Occupational History   Not on file  Tobacco Use   Smoking status: Not on file   Smokeless tobacco: Not on file  Substance and Sexual Activity  Alcohol use: Not on file   Drug use: Not on file   Sexual activity: Not on file  Other Topics Concern   Not on file  Social History Narrative   Not on file   Social Drivers of Health   Financial Resource Strain: Not on file  Food Insecurity: No Food Insecurity (11/24/2023)   Hunger Vital Sign    Worried About Running Out of Food in the Last Year: Never true    Ran Out of Food in the Last Year: Never true  Transportation Needs: No Transportation Needs (11/24/2023)   PRAPARE - Administrator, Civil Service (Medical): No    Lack of Transportation (Non-Medical): No  Physical Activity: Not on file  Stress:  Not on file  Social Connections: Not on file  Intimate Partner Violence: Not At Risk (11/24/2023)   Humiliation, Afraid, Rape, and Kick questionnaire    Fear of Current or Ex-Partner: No    Emotionally Abused: No    Physically Abused: No    Sexually Abused: No     No family history on file.  Vitals:   12/04/23 0815  BP: 120/70  Pulse: 93  SpO2: 99%  Weight: 61.1 kg (134 lb 9.6 oz)  Height: 5\' 4"  (1.626 m)    PHYSICAL EXAM: General:  well appearing.  No respiratory difficulty. Walked into clinic.  Neck: supple. JVD flat.  Cor: PMI nondisplaced. Regular rate & rhythm. No rubs, gallops or murmurs. Lungs: diminished Abdomen: soft, nontender, nondistended. Good bowel sounds. Extremities: no cyanosis, clubbing, rash, edema  Neuro: alert & oriented x 3. Moves all 4 extremities w/o difficulty. Affect pleasant.   Wt Readings from Last 3 Encounters:  12/04/23 61.1 kg (134 lb 9.6 oz)  11/25/23 57.3 kg (126 lb 4.8 oz)   ECG: NSR 91 bpm LBBB (Personally reviewed)    ReDs reading: 30 %, normal    ASSESSMENT & PLAN: Recently diagnosed systolic heart failure - Echo EF < 20%, RV nl, global HK, mid-mod MR. LVOT VTI of 7.85 with SV of 25cc concerning for low output heart failure  - Cath 11/24/23: Non-obstructive CAD EF 25% Well compensated filling pressures with moderate to severely reduced CO (PCW 7, CI 2.07) - cMRI 5/25 with EF 18%, mod dilated LV with septal-lateral dyssynchrony consistent with LBBB and diffuse severe HK. Normal RV, RV EF 35%. Mild-mod MR.  - NYHA IIIa, appears euvolemic on exam.  - Start lasix  20 mg daily PRN for weight gain >3lbs overnight or 5lbs in 1 week.  - Increase losartan  12.5>25 mg daily. Plan for eventual Viola Greulich is renal function remains stable.  - Continue spironolactone  25 mg daily - Continue digoxin  0.125 mg. Check level today. Has not had meds this morning.  - Continue Jardiance  10 mg daily - May need CRT in 3 months if EF not improving.  - Repeat  Echo in ~3 months  - Labs today. Repeat BMET 1 week.   Non-obstructive diffuse CAD - Continue ASA and statin - Imperative to stop smoking.   HTN - BP stable today - meds as above  ETOH/tobacco abuse - Previously drank 2-3 16 oz beers/day. Has not drank since discharge. Congratulated - Smokes minimally 2/2 SOB. Complete cessation advised  Follow up in 4-6 weeks with PharmD for med titration. Follow up in 3 months with Dr. Julane Ny +echo.   Works as Production designer, theatre/television/film at [employer]. Work note provided.  Sheryl Donna AGACNP-BC  [facility] [address]  [facility] [place] [address] [phone] (office) [phone] (fax)

## 2023-12-01 ENCOUNTER — Telehealth (HOSPITAL_COMMUNITY): Payer: Self-pay

## 2023-12-01 NOTE — Telephone Encounter (Signed)
 Called to confirm/remind patient of their appointment at the Advanced [facility] Failure Clinic on 12/04/2023 9:00.   Appointment:   [x] Confirmed  [] Left mess   [] No answer/No voice mail  [] VM Full/unable to leave message  [] Phone not in service  Patient reminded to bring all medications and/or complete list.  Confirmed patient has transportation. Gave directions, instructed to utilize valet parking.

## 2023-12-04 ENCOUNTER — Ambulatory Visit (HOSPITAL_COMMUNITY): Payer: Self-pay | Admitting: Internal Medicine

## 2023-12-04 ENCOUNTER — Encounter (HOSPITAL_COMMUNITY): Payer: Self-pay

## 2023-12-04 ENCOUNTER — Other Ambulatory Visit (HOSPITAL_COMMUNITY): Payer: Self-pay

## 2023-12-04 ENCOUNTER — Ambulatory Visit (HOSPITAL_COMMUNITY)
Admit: 2023-12-04 | Discharge: 2023-12-04 | Disposition: A | Discharge status: Home / Self Care | Attending: Internal Medicine | Admitting: Internal Medicine

## 2023-12-04 ENCOUNTER — Telehealth (HOSPITAL_COMMUNITY): Payer: Self-pay

## 2023-12-04 VITALS — BP 120/70 | HR 93 | Ht 64.0 in | Wt 134.6 lb

## 2023-12-04 DIAGNOSIS — I447 Left bundle-branch block, unspecified: Secondary | ICD-10-CM | POA: Insufficient documentation

## 2023-12-04 DIAGNOSIS — Z7984 Long term (current) use of oral hypoglycemic drugs: Secondary | ICD-10-CM | POA: Insufficient documentation

## 2023-12-04 DIAGNOSIS — I5022 Chronic systolic (congestive) heart failure: Secondary | ICD-10-CM | POA: Insufficient documentation

## 2023-12-04 DIAGNOSIS — I10 Essential (primary) hypertension: Secondary | ICD-10-CM | POA: Diagnosis not present

## 2023-12-04 DIAGNOSIS — F17210 Nicotine dependence, cigarettes, uncomplicated: Secondary | ICD-10-CM | POA: Insufficient documentation

## 2023-12-04 DIAGNOSIS — F1010 Alcohol abuse, uncomplicated: Secondary | ICD-10-CM | POA: Diagnosis not present

## 2023-12-04 DIAGNOSIS — R0602 Shortness of breath: Secondary | ICD-10-CM | POA: Diagnosis not present

## 2023-12-04 DIAGNOSIS — J811 Chronic pulmonary edema: Secondary | ICD-10-CM | POA: Diagnosis not present

## 2023-12-04 DIAGNOSIS — Z720 Tobacco use: Secondary | ICD-10-CM

## 2023-12-04 DIAGNOSIS — I2510 Atherosclerotic heart disease of native coronary artery without angina pectoris: Secondary | ICD-10-CM | POA: Diagnosis not present

## 2023-12-04 DIAGNOSIS — Z7982 Long term (current) use of aspirin: Secondary | ICD-10-CM | POA: Diagnosis not present

## 2023-12-04 DIAGNOSIS — Z79899 Other long term (current) drug therapy: Secondary | ICD-10-CM | POA: Diagnosis not present

## 2023-12-04 DIAGNOSIS — I110 Hypertensive heart disease with heart failure: Secondary | ICD-10-CM | POA: Diagnosis not present

## 2023-12-04 LAB — BASIC METABOLIC PANEL WITH GFR
Anion gap: 10 (ref 5–15)
BUN: 14 mg/dL (ref 6–20)
CO2: 23 mmol/L (ref 22–32)
Calcium: 9.2 mg/dL (ref 8.9–10.3)
Chloride: 105 mmol/L (ref 98–111)
Creatinine, Ser: 1.21 mg/dL (ref 0.61–1.24)
GFR, Estimated: >60 mL/min (ref >60)
Glucose, Bld: 95 mg/dL (ref 70–99)
Potassium: 4.6 mmol/L (ref 3.5–5.1)
Sodium: 138 mmol/L (ref 135–145)

## 2023-12-04 LAB — BRAIN NATRIURETIC PEPTIDE: B Natriuretic Peptide: 980.7 pg/mL — ABNORMAL HIGH (ref 0.0–100.0)

## 2023-12-04 LAB — DIGOXIN LEVEL: Digoxin Level: 0.6 ng/mL — ABNORMAL LOW (ref 0.8–2.0)

## 2023-12-04 MED ORDER — FUROSEMIDE 20 MG PO TABS: 20.0000 mg | ORAL_TABLET | ORAL | 3 refills | Status: DC | PRN

## 2023-12-04 NOTE — Telephone Encounter (Signed)
 Advanced Heart Failure Patient Advocate Encounter  Prior authorization for Jardiance  has been submitted and approved. Test billing returns $93.57 for 30 day supply. This insurance limits 30 days, and this pt is eligible to use copay savings card for this medication.  [place] Effective: 12/04/2023 to 12/03/2024  Kennis Peacock, CPhT Rx Patient Advocate Phone: [phone]

## 2023-12-04 NOTE — Addendum Note (Signed)
 Encounter addended by: Edman Gory, RN on: 12/04/2023 10:23 AM  Actions taken: Charge Capture section accepted

## 2023-12-04 NOTE — Patient Instructions (Signed)
 Take Losartan  25 mg daily. Take Lasix  20 mg as needed for weight gain > 3 lbs overnight or > 5 lbs in one week, increased shortness of breath, or increased swelling. Labs today - will call you if abnormal. Return to Heart Failure Pharmacy Clinic in 4 - 6 weeks. See below. Return to see Dr. Julane Ny with echo in 3 months. CALL US  AT [phone] IN AUGUST TO SCHEDULE THIS APPOINTMENT. Please call us  at [phone] if any questions or concerns prior to your next appointment.

## 2023-12-04 NOTE — Progress Notes (Signed)
 ReDS Vest / Clip - 12/04/23 0832       ReDS Vest / Clip   Station Marker A    Ruler Value 27    ReDS Value Range Low volume    ReDS Actual Value 30

## 2023-12-06 ENCOUNTER — Other Ambulatory Visit: Payer: Self-pay

## 2023-12-06 MED ORDER — LOSARTAN POTASSIUM 25 MG PO TABS
25.0000 mg | ORAL_TABLET | Freq: Every day | ORAL | 6 refills | Status: DC
Start: 2023-12-06 — End: 2024-01-25
  Filled 2023-12-06 – 2024-01-08 (×3): qty 30, 30d supply, fill #0

## 2023-12-06 MED ORDER — ROSUVASTATIN CALCIUM 20 MG PO TABS
20.0000 mg | ORAL_TABLET | Freq: Every day | ORAL | 6 refills | Status: DC
  Filled 2023-12-06 – 2024-01-08 (×3): qty 30, 30d supply, fill #0

## 2023-12-06 MED ORDER — FUROSEMIDE 20 MG PO TABS
20.0000 mg | ORAL_TABLET | ORAL | 6 refills | Status: DC | PRN
  Filled 2023-12-06 – 2023-12-07 (×3): qty 30, 30d supply, fill #0

## 2023-12-06 MED ORDER — SPIRONOLACTONE 25 MG PO TABS
12.5000 mg | ORAL_TABLET | Freq: Every day | ORAL | 6 refills | Status: DC
  Filled 2023-12-06: qty 15, 30d supply, fill #0

## 2023-12-06 MED ORDER — EMPAGLIFLOZIN 10 MG PO TABS
10.0000 mg | ORAL_TABLET | Freq: Every day | ORAL | 6 refills | Status: DC
  Filled 2023-12-06 – 2024-01-08 (×3): qty 30, 30d supply, fill #0

## 2023-12-06 MED ORDER — DIGOXIN 125 MCG PO TABS
0.1250 mg | ORAL_TABLET | Freq: Every day | ORAL | 6 refills | Status: DC
  Filled 2023-12-06 – 2024-01-08 (×3): qty 30, 30d supply, fill #0

## 2023-12-06 MED ORDER — ASPIRIN 81 MG PO TBEC
81.0000 mg | DELAYED_RELEASE_TABLET | Freq: Every day | ORAL | 12 refills | Status: DC
  Filled 2023-12-06 – 2024-06-07 (×2): qty 30, 30d supply, fill #0

## 2023-12-06 NOTE — Telephone Encounter (Signed)
 Pt called to report he can not afford copays for meds so is not able to get them, advised will send RX[facility] to [facility] as they will be able to work with him on the cost, pt agreeable and states he will pick them up tomorrow.

## 2023-12-07 ENCOUNTER — Other Ambulatory Visit: Payer: Self-pay

## 2023-12-12 ENCOUNTER — Other Ambulatory Visit (HOSPITAL_COMMUNITY): Payer: Self-pay

## 2023-12-14 ENCOUNTER — Telehealth (HOSPITAL_COMMUNITY): Payer: Self-pay | Admitting: Cardiology

## 2023-12-14 NOTE — Telephone Encounter (Signed)
 Pt called to report blurry vision in R eye since starting new med (unsure of name) Reports before starting new medication he could see perfectly.  -unsure of last visual exam  Mild CP noted yesterday after walking long distance, relieved on its own  Mild SOB noted yesterday after walking Denies weight gain or swelling  Advised to also reach out to PCP in the event symptoms above are not 100% cardiac in nature    Please advise

## 2023-12-14 NOTE — Telephone Encounter (Signed)
Pt aware.

## 2023-12-27 ENCOUNTER — Telehealth (HOSPITAL_COMMUNITY): Payer: Self-pay | Admitting: Cardiology

## 2023-12-27 DIAGNOSIS — Z139 Encounter for screening, unspecified: Secondary | ICD-10-CM

## 2023-12-27 NOTE — Telephone Encounter (Signed)
 Patient called to request letter  Reports most days he is too SOB to work and employer needs letter documenting Reports he will begin the process of disability

## 2023-12-28 ENCOUNTER — Encounter (HOSPITAL_COMMUNITY): Payer: Self-pay | Admitting: Cardiology

## 2023-12-29 ENCOUNTER — Encounter (HOSPITAL_COMMUNITY): Payer: Self-pay | Admitting: Family Medicine

## 2023-12-29 NOTE — Telephone Encounter (Signed)
 Spoke with patient, advised him that letter has been sent to MyChart. Patient verbalized understanding.   Advised patient to call back to office with any issues, questions, or concerns. Patient verbalized understanding.

## 2024-01-03 ENCOUNTER — Telehealth: Payer: Self-pay | Admitting: Licensed Clinical Social Worker

## 2024-01-03 ENCOUNTER — Other Ambulatory Visit (HOSPITAL_COMMUNITY): Payer: Self-pay

## 2024-01-03 NOTE — Telephone Encounter (Signed)
 Patient called with concerns regarding disability process.  Reports he is trying to get his disability started, reports he will be homeless tomorrow  Advised disability process takes some time to complete, pt is unsure if he has fmla/STD through employer.   Advised would send referral to LCSW to call patient to assist in away we could -referral placed and message sent

## 2024-01-03 NOTE — Progress Notes (Signed)
 Heart and Vascular Care Navigation  01/03/2024  Jesse Key 1966-03-03 [id number]  Reason for Referral: community resources; disability assistance Patient is participating in a Managed Medicaid Plan:Yes- Medicaid [facility]  Engaged with patient by telephone for initial visit for Heart and Vascular Care Coordination.                                                                                                   Assessment:                                     LCSW was able to reach pt at [phone]. Introduced self, role, reason for call. Pt confirmed address on file is mailing address, he is currently staying in a hotel while his aunts house gets repairs. He does not have a car, usually walks or takes the bus as able. He works part time at [employer] and has been out of work due to his health. He has been in touch with his manager is unsure if he has short term disability (I assume he may not as he does not remember signing up for benefits). We discussed referral to [facility] to complete disability through Social Security if eligible. I will send Release of Information to pharmacist Jesse Key, encouraged pt to attend appt and he can sign there to save the delay of mailing form.   Pt shares challenges with costs of hotel, food, and transportation. His brother tries to support him financially as needed when he can, but he lives in [place]. He did not pick up his medications from [facility] as recommended at the beginning of June due to multiple reasons.   LCSW shared that I am not aware of any ongoing assistance financially with hotel costs. Encouraged him to reach out to resources we will send but if none of them are able to assist and he finds himself without a room, that he needs to call Coordinated Entry program and will provide that information. Pt does not receive any benefits such as SNAP, we will send him information and do our best to get him assessed for those benefits. He does have  Medicaid but it is not primary as I note an Midwife on file. Pt isnt sure where that came from, discussed it appears like a Ecologist. Discussed if he does not want to maintain both insurance plans that he can call and speak with [employer]/cancel it if desired. Also discussed how to access Medicaid benefits such as transportation, and AR account for medication assistance at [facility].   Will send all resources through MyChart given changing housing situation and challenges with mail delays. Encouraged him to call me as needed.   HRT/VAS Care Coordination     Patients Home Cardiology Office Heart Failure Clinic   Outpatient Care Team Social Worker   Social Worker Name: Jesse Key, [place], [phone]   Living arrangements for the past 2 months Hotel/Motel   Lives with: Self   Patient Current Insurance Coverage Medicaid; [employer]  Patient Has Concern With Paying Medical Bills Yes   Patient Concerns With Medical Bills doesnt understand his benefits;  general financial concerns   Medical Bill Referrals: disability referral;  discussed how to contact Aetna and Medicaid to sort out primary benefits   Does Patient Have Prescription Coverage? Yes       Social History:                                                                             SDOH Screenings   Food Insecurity: Food Insecurity Present (01/03/2024)  Housing: High Risk (01/03/2024)  Transportation Needs: Unmet Transportation Needs (01/03/2024)  Utilities: Not At Risk (01/03/2024)  Financial Resource Strain: High Risk (01/03/2024)  Health Literacy: Adequate Health Literacy (01/03/2024)    SDOH Interventions: Financial Resources:  Financial Strain Interventions: Other (Comment) (works part time; currently out of work- referred for disability, transportation resources, housing resources, food resources) DSS for financial assistance and Tree surgeon for Medical illustrator Insecurity:  Food  Insecurity Interventions: [employer] Provided- Environmental manager; [employer] information from [employer]  Housing Insecurity:  Housing Interventions: [employer] Provided- resides in hotel, unsure of any ongoing assistance financially for [employer], discussed coordinated entry line and Forensic psychologist:   Transportation Interventions: Patient Resources (Friends/Family), Associate Professor, Sports administrator Given     Other Care Navigation Interventions:     Inpatient/Outpatient Substance Abuse Counseling/Rehab Options Did not discuss at this time  Provided Pharmacy assistance resources  Discussed how to acccess his medications by using transportation through [facility]; also discussed using AR account if he not able to provide up front payment    Follow-up plan:   LCSW has sent pt a myChart with resources.  Will send release of information form to pharmD to review at his appt on Monday. Pt advised on how to access transportation, charge account for [employer] and Coordinated Entry for housing challenges. Will f/u to send formal referral for [facility] once able.

## 2024-01-03 NOTE — Addendum Note (Signed)
 Addended by: Binnie Vonderhaar, DALTON HERO on: 01/03/2024 09:43 AM   Modules accepted: Orders

## 2024-01-08 ENCOUNTER — Other Ambulatory Visit: Payer: Self-pay

## 2024-01-08 ENCOUNTER — Telehealth: Payer: Self-pay | Admitting: Licensed Clinical Social Worker

## 2024-01-08 ENCOUNTER — Ambulatory Visit (HOSPITAL_COMMUNITY)
Admission: RE | Admit: 2024-01-08 | Discharge: 2024-01-08 | Disposition: A | Discharge status: Home / Self Care | Source: Ambulatory Visit | Attending: Cardiology | Admitting: Cardiology

## 2024-01-08 ENCOUNTER — Other Ambulatory Visit (HOSPITAL_COMMUNITY): Payer: Self-pay

## 2024-01-08 VITALS — BP 138/78 | HR 107 | Wt 138.4 lb

## 2024-01-08 DIAGNOSIS — I428 Other cardiomyopathies: Secondary | ICD-10-CM | POA: Insufficient documentation

## 2024-01-08 DIAGNOSIS — Z7984 Long term (current) use of oral hypoglycemic drugs: Secondary | ICD-10-CM | POA: Insufficient documentation

## 2024-01-08 DIAGNOSIS — R0602 Shortness of breath: Secondary | ICD-10-CM | POA: Insufficient documentation

## 2024-01-08 DIAGNOSIS — Z79899 Other long term (current) drug therapy: Secondary | ICD-10-CM | POA: Insufficient documentation

## 2024-01-08 DIAGNOSIS — I110 Hypertensive heart disease with heart failure: Secondary | ICD-10-CM | POA: Insufficient documentation

## 2024-01-08 DIAGNOSIS — F17210 Nicotine dependence, cigarettes, uncomplicated: Secondary | ICD-10-CM | POA: Diagnosis not present

## 2024-01-08 DIAGNOSIS — Z5900 Homelessness unspecified: Secondary | ICD-10-CM | POA: Diagnosis not present

## 2024-01-08 DIAGNOSIS — I340 Nonrheumatic mitral (valve) insufficiency: Secondary | ICD-10-CM | POA: Diagnosis not present

## 2024-01-08 DIAGNOSIS — I2510 Atherosclerotic heart disease of native coronary artery without angina pectoris: Secondary | ICD-10-CM | POA: Insufficient documentation

## 2024-01-08 DIAGNOSIS — I447 Left bundle-branch block, unspecified: Secondary | ICD-10-CM | POA: Insufficient documentation

## 2024-01-08 DIAGNOSIS — I5022 Chronic systolic (congestive) heart failure: Secondary | ICD-10-CM | POA: Diagnosis present

## 2024-01-08 DIAGNOSIS — Z7982 Long term (current) use of aspirin: Secondary | ICD-10-CM | POA: Diagnosis not present

## 2024-01-08 MED ORDER — SPIRONOLACTONE 25 MG PO TABS
25.0000 mg | ORAL_TABLET | Freq: Every day | ORAL | 11 refills | Status: DC
  Filled 2024-01-08 (×2): qty 30, 30d supply, fill #0

## 2024-01-08 NOTE — Progress Notes (Signed)
 Advanced Heart Failure Clinic Note   Primary Care: No primary care provider on file. Primary Cardiologist: None AHF Cardiologist: Toribio Fuel, MD    HPI:  Jesse Key is a 58 y.o. AA male w/ no prior cardiac history. No routine medical care. Was told once before that he had high blood pressure when trying to donate plasma. He said he was told his BP was in the 200s but he did not seek any medical care. Active smoker, smoking since age 56, 1/2 ppd. Drinks daily, 2-3 16 oz beers/day. Denies any other substance use. No known family h/o heart disease or SCD.   Admitted 11/2023 with acute systolic heart failure. BNP 2.4K, CXR with pulmonary edema. EKG with ST and atypical LBBB. HsTrop flat. Echo showed EF < 20% with global hypokinesis, LVOT VTI of 7.85 with SV of 25cc concerning for low output heart failure. RV normal, mid-mod MR.  Diuresed well with IV lasix  and he was started on GDMT. Cath 11/24/23: Non-obstructive CAD, EF 25%, well compensated filling pressures with moderate to severely reduced CO (PCW 7, CI 2.07). cMRI prior to discharge showed 5/25 with EF 18%, mod dilated LV with septal-lateral dyssynchrony consistent with LBBB and diffuse severe HK. Normal RV, RV EF 35%. Mild-mod MR. Concern for LBBB CM. Discharged on GDMT and close f/u.   At last visit with APP he presented for post hospital follow up. Overall felt ok. Denied palpitations, CP,  edema, or PND/Orthopnea. Dizziness reported with abrupt movements. SOB with exertion. Appetite ok, tries to watch what he eats, minimal salt use. No fever or chills. Did not weigh at home. States he was taking all medications. Smokes minimally d/t SOB. Had not drank ETOH since discharged.  Today he returns to HF clinic for pharmacist medication titration. At last visit with APP furosemide  20 mg PRN was started. Additionally, Losartan  was increased from 12.5 mg daily to 25 mg daily. He reports that he has been having difficulty staying compliant to  medication regimen. Last week was his last day at a motel, and he has since been sleeping on the street, and unable to get refills on medications. He states that it has been ~ 3 weeks since he has last taken his medications because he could not afford them. Overall he does state he is feeling better since his last visit, but since he has been off of meds has been experiencing more SOB. Denies dizziness, lightheadedness, fatigue but has had difficulty sleeping - attributable to housing difficulties. Denies chest pain, but endorses some fluttering in his chest, especially if walking fast. States he is currently able to walk ~ 100 yards before becoming SOB, but while taking his medications he was able to walk ~ 1/2 mile. Denies having swelling and has not had to use furosemide  in ~ 1 month, no LEE on exam. In depth conversation was had about ensuring access to medications. Patient states he uses the bus to get around so having a pharmacy along bus line would be preferable. He states he is comfortable getting medications filled at [facility]. Pharmacy patient advocate took time to explain A/R payment system and patient verbalized understanding. He is in contact with HF SOWO Reggie) who is helping him with disability application and we coordinated to have him sign [employer] application today. Has not been cleared to work yet.   HF Medications: Out of all HF medications except PRN furosemide  x3 weeks Losartan  25 mg daily  Spironolactone   12.5 mg daily  Jardiance  10 mg daily Digoxin  0.125 mg daily  Furosemide  20 mg PRN  Has the patient been experiencing any side effects to the medications prescribed?  No  Does the patient have any problems obtaining medications due to transportation or finances?   Yes. Patient experiencing homelessness and has difficulty getting refills. He has Hospital doctor and Medicaid is secondary. He is able to fill his meds at [facility] location) and copay cost was placed on AR Account. Patient can pay later and pay just what he can afford. He walked across the street today and picked up his medications after the visit (confirmed in Epic he was able to get these). For transportation, he has been instructed how to call Medicaid to get to appointments. He also uses the bus as he is living around bus stations. I gave him some bus passes today. Currently living neat [employer] on [address]. HF SOWO is engaged and helping him apply for disability. Appreciate their assistance.   Understanding of regimen: fair Understanding of indications: fair Potential of compliance: refills sent to [facility] - patient voiced understanding about A/R account and will try to keep up compliance Patient understands to avoid NSAIDs. Patient understands to avoid decongestants.    Pertinent Lab Values: 12/04/23: Serum creatinine 1.21, BUN 14, Potassium 4.6, Sodium 138, BNP 980.7, (11/25/23) Magnesium 2.1, Digoxin  0.6   Vital Signs: Weight: 138.4 lbs (last clinic weight: 134.6 lbs) Blood pressure: 138/78 mmHg Heart rate: 107 bpm  Assessment/Plan: 1. Chronic systolic CHF (EF <79%), due to NICM. NYHA class II symptoms. - Echo EF < 20%, RV nl, global HK, mid-mod MR. LVOT VTI of 7.85 with SV of 25cc concerning for low output heart failure  - Cath 11/24/23: Non-obstructive CAD EF 25% Well compensated filling pressures with moderate to severely reduced CO (PCW 7, CI 2.07) - cMRI 5/25 with EF 18%, mod dilated LV with septal-lateral dyssynchrony consistent with LBBB and diffuse severe HK. Normal RV, RV EF 35%. Mild-mod MR.  - NYHA II. Patient appears euvolemic on exam.  Has been out of all medications except PRN furosemide  for 3 weeks.  - Continue furosemide  20 mg daily PRN for weight gain >3lbs overnight or 5lbs in 1 week.  - Restart losartan  25 mg daily.  - Restart spironolactone  and increase to 25 mg  daily - Restart Jardiance  10 mg daily  - Restart digoxin  0.125 mg daily - As above, was able to set up patient with an AR account that he can use at our pharmacy to get medications for $0 today. He can pay over time to settle account. He is aware to call our office to speak with patient advocate or pharmacist if he has issues in the future with medications.   2. Non-obstructive diffuse CAD - Continue ASA and statin - Imperative to stop smoking.   3. HTN - BP 138/78 mmHg - Losartan  25 mg daily  4. ETOH/tobacco abuse - Previously drank 2-3 16 oz beers/day - Previously smoked minimally 2/2 SOB  5. Social -Has now been homeless for 1 week, living near bus stops for shelter. HF SOWO engaged and following. They are currently helping him submit disability application. [facility] application signed in clinic visit today and I faxed to [place], our social worker to submit. Appreciate their assistance.  -Currently insured, has Arts development officer and Medicaid secondary.   Follow up in 2 weeks with APP. Will need BMET.  Tinnie Redman, PharmD, BCPS, BCCP, CPP Heart Failure Clinic Pharmacist [phone]

## 2024-01-08 NOTE — Patient Instructions (Signed)
 It was a pleasure seeing you today!  MEDICATIONS: -Please restart losartan , Jardiance , digoxin , rosuvastatin  and spironolactone . We will increase spironolactone  to 25 mg (1 tablet) today.  -Walk across the street to [facility] on [address]. They are processing your medications there. If there are any copays, please ask them to put the balance on your A.R. Account. This will allow you to leave with the medications for $0 and you can pay in much smaller increments later.  -Call if you have questions about your medications.   NEXT APPOINTMENT: Return to clinic in 2 weeks with APP Clinic. -Call Rachel Shove (our patient advocate) at [phone] if you run into any issues with getting your medications.   In general, to take care of your heart failure: -Limit your fluid intake to 2 Liters (half-gallon) per day.   -Limit your salt intake to ideally 2-3 grams (2000-3000 mg) per day. -Weigh yourself daily and record, and bring that weight diary to your next appointment.  (Weight gain of 2-3 pounds in 1 day typically means fluid weight.) -The medications for your heart are to help your heart and help you live longer.   -Please contact us  before stopping any of your heart medications.  Call the clinic at [phone] with questions or to reschedule future appointments.

## 2024-01-08 NOTE — Telephone Encounter (Signed)
 Patient has been enrolled in a copay savings card with billing information as follows:  BIN [id number] PCN LOYALTY ID [id number] GROUP [id number]  Information added to [facility] for future reference as well.

## 2024-01-08 NOTE — Telephone Encounter (Signed)
 H&V Care Navigation CSW Progress Note  Clinical Social Worker contacted patient by phone to f/u on housing situation and [facility] referral. Pt reached at [phone]- he provided additional information for referral, I was able to receive ROI from pharmacy team. Pt shares he was contacted by Coordinated Entry and will go complete assessment with them Wednesday at the [facility] downtown. He feels more comfortable with his medications and accessing those. No additional questions right now but I will f/u to answer any questions and pt encouraged to contact me as needed.   Patient is participating in a Managed Medicaid Plan:  Yes- Healthy Blue  SDOH Screenings   Food Insecurity: Food Insecurity Present (01/03/2024)  Housing: High Risk (01/08/2024)  Transportation Needs: Unmet Transportation Needs (01/03/2024)  Utilities: Not At Risk (01/03/2024)  Financial Resource Strain: High Risk (01/03/2024)  Health Literacy: Adequate Health Literacy (01/03/2024)   Marit Lark, MSW, LCSW Clinical Social Worker II [facility] Heart/Vascular Care Navigation  [phone]- work cell phone (preferred)

## 2024-01-09 NOTE — Telephone Encounter (Signed)
 H&V Care Navigation CSW Progress Note  Clinical Social Worker contacted patient by phone to f/u on missing SSN on referral form. Was able to contact pt who provided that and was updated that referral (ROI, referral form and homeless verification) were securely sent to [facility].  Patient is participating in a Managed Medicaid Plan:  Yes  SDOH Screenings   Food Insecurity: Food Insecurity Present (01/03/2024)  Housing: High Risk (01/08/2024)  Transportation Needs: Unmet Transportation Needs (01/03/2024)  Utilities: Not At Risk (01/03/2024)  Financial Resource Strain: High Risk (01/03/2024)  Health Literacy: Adequate Health Literacy (01/03/2024)    Marit Lark, MSW, LCSW Clinical Social Worker II [facility] Heart/Vascular Care Navigation  [phone]- work cell phone (preferred)

## 2024-01-10 ENCOUNTER — Telehealth: Payer: Self-pay | Admitting: Licensed Clinical Social Worker

## 2024-01-10 NOTE — Telephone Encounter (Signed)
 H&V Care Navigation CSW Progress Note  Clinical Social Worker received a call from pt. He shares that he completed Coordinated Entry referral interview. They encouraged him to call if they dont reach out again in a few weeks. He is still out of work, doesn't have housing or income, discussed shelter list sent to pt and that the [facility] is a good resource for cooling and hydration as needed during the day. I will send him the list again for reference.  Patient is participating in a Managed Medicaid Plan:  Yes- Healthy Blue  SDOH Screenings   Food Insecurity: Food Insecurity Present (01/03/2024)  Housing: High Risk (01/08/2024)  Transportation Needs: Unmet Transportation Needs (01/03/2024)  Utilities: Not At Risk (01/03/2024)  Financial Resource Strain: High Risk (01/03/2024)  Health Literacy: Adequate Health Literacy (01/03/2024)   Marit Lark, MSW, LCSW Clinical Social Worker II [facility] Health Heart/Vascular Care Navigation  [phone]- work cell phone (preferred)

## 2024-01-15 ENCOUNTER — Ambulatory Visit (INDEPENDENT_AMBULATORY_CARE_PROVIDER_SITE_OTHER): Admitting: Primary Care

## 2024-01-15 ENCOUNTER — Encounter (INDEPENDENT_AMBULATORY_CARE_PROVIDER_SITE_OTHER): Payer: Self-pay | Admitting: Primary Care

## 2024-01-15 VITALS — BP 119/65 | HR 92 | Resp 16 | Ht 65.0 in | Wt 132.0 lb

## 2024-01-15 DIAGNOSIS — Z5900 Homelessness unspecified: Secondary | ICD-10-CM | POA: Diagnosis not present

## 2024-01-15 DIAGNOSIS — Z7689 Persons encountering health services in other specified circumstances: Secondary | ICD-10-CM

## 2024-01-15 NOTE — Progress Notes (Signed)
 New Patient Office Visit  Subjective    Patient ID: Jesse Key male  DOB: 05/07/1966  Age: 58 y.o. MRN: [id number]   CC:  Chest pain from laying on concrete HPI  Patient is homeless sleeping on concrete which is just causing his heart to hurt more.  Recently diagnosed with congestive heart failure and followed by Dr. Bensimhon.  Which has also completed his hospital follow-up.  Today his blood pressure is 119/65.  Patient states he has completely stopped smoking and has reduced his alcohol intake. Patient has No headache, No chest pain, No abdominal pain - No Nausea, No new weakness tingling or numbness, No Cough - shortness of breath Chest pain is costochondritis.  Current Outpatient Medications on File Prior to Visit  Medication Sig Dispense Refill   albuterol  (VENTOLIN  HFA) 108 (90 Base) MCG/ACT inhaler Inhale 2 puffs into the lungs every 6 (six) hours as needed for wheezing or shortness of breath. 6.7 g 0   aspirin  EC 81 MG tablet Take 1 tablet (81 mg total) by mouth daily. Swallow whole. 30 tablet 12   cyclobenzaprine (FLEXERIL) 10 MG tablet Take 10 mg by mouth 2 (two) times daily as needed.     digoxin  (LANOXIN ) 0.125 MG tablet Take 1 tablet (0.125 mg total) by mouth daily. 30 tablet 6   empagliflozin  (JARDIANCE ) 10 MG TABS tablet Take 1 tablet (10 mg total) by mouth daily. 30 tablet 6   furosemide  (LASIX ) 20 MG tablet Take 1 tablet (20 mg total) by mouth once daily as needed. 30 tablet 6   losartan  (COZAAR ) 25 MG tablet Take 1 tablet (25 mg total) by mouth daily. 30 tablet 6   rosuvastatin  (CRESTOR ) 20 MG tablet Take 1 tablet (20 mg total) by mouth daily. 30 tablet 6   spironolactone  (ALDACTONE ) 25 MG tablet Take 1 tablet (25 mg total) by mouth daily. 30 tablet 11   No current facility-administered medications on file prior to visit.     Allergies  Allergen Reactions   Shellfish Allergy Itching    History reviewed. No pertinent past medical history.   Past Surgical  History:  Procedure Laterality Date   RIGHT/LEFT HEART CATH AND CORONARY ANGIOGRAPHY N/A 11/24/2023   Procedure: RIGHT/LEFT HEART CATH AND CORONARY ANGIOGRAPHY;  Surgeon: Cherrie Toribio SAUNDERS, MD;  Location: [facility] INVASIVE CV LAB;  Service: Cardiovascular;  Laterality: N/A;     History reviewed. No pertinent family history.  Social History   Socioeconomic History   Marital status: Single    Spouse name: Not on file   Number of children: Not on file   Years of education: Not on file   Highest education level: Not on file  Occupational History   Not on file  Tobacco Use   Smoking status: Not on file   Smokeless tobacco: Not on file  Substance and Sexual Activity   Alcohol use: Not on file   Drug use: Not on file   Sexual activity: Not on file  Other Topics Concern   Not on file  Social History Narrative   Not on file   Social Drivers of Health   Financial Resource Strain: High Risk (01/03/2024)   Overall Financial Resource Strain (CARDIA)    Difficulty of Paying Living Expenses: Hard  Food Insecurity: Food Insecurity Present (01/03/2024)   Hunger Vital Sign    Worried About Running Out of Food in the Last Year: Sometimes true    Ran Out of Food in the Last Year: Sometimes true  Transportation Needs: Unmet Transportation Needs (01/03/2024)   PRAPARE - Administrator, Civil Service (Medical): Yes    Lack of Transportation (Non-Medical): Yes  Physical Activity: Not on file  Stress: Not on file  Social Connections: Not on file  Intimate Partner Violence: Not At Risk (11/24/2023)   Humiliation, Afraid, Rape, and Kick questionnaire    Fear of Current or Ex-Partner: No    Emotionally Abused: No    Physically Abused: No    Sexually Abused: No       Health Maintenance  Topic Date Due   Hepatitis C Screening  Never done   DTaP/Tdap/Td vaccine (1 - Tdap) Never done   Pneumococcal Vaccination (1 of 2 - PCV) Never done   Colon Cancer Screening  Never done   Zoster  (Shingles) Vaccine (1 of 2) Never done   COVID-19 Vaccine (1 - 2024-25 season) Never done   Flu Shot  02/02/2024   HIV Screening  Completed   Hepatitis B Vaccine  Aged Out   HPV Vaccine  Aged Out   Meningitis B Vaccine  Aged Out    Objective    BP 119/65   Pulse 92   Resp 16   Ht 5' 5 (1.651 m)   Wt 132 lb (59.9 kg)   SpO2 99%   BMI 21.97 kg/m  BP Readings from Last 3 Encounters:  01/15/24 119/65  01/08/24 138/78  12/04/23 120/70       Physical Exam Vitals reviewed.  HENT:     Head: Normocephalic.     Right Ear: Tympanic membrane and external ear normal.     Left Ear: Tympanic membrane and external ear normal.     Nose: Nose normal.  Eyes:     Extraocular Movements: Extraocular movements intact.     Comments: Conjunctivae yellow  Cardiovascular:     Rate and Rhythm: Normal rate and regular rhythm.  Pulmonary:     Effort: Pulmonary effort is normal.     Comments: diminished Abdominal:     General: Bowel sounds are normal. There is distension.     Palpations: Abdomen is soft.  Musculoskeletal:        General: Normal range of motion.  Skin:    General: Skin is warm and dry.  Neurological:     Mental Status: He is alert and oriented to person, place, and time.  Psychiatric:        Mood and Affect: Mood normal.        Behavior: Behavior normal.        Thought Content: Thought content normal.        Judgment: Judgment normal.     Assessment & Plan:  [place] was seen today for new patient (initial visit).  Diagnoses and all orders for this visit:  Encounter to establish care  Homeless He has a Environmental manager with finding shelter/home    Follow-up:  Return if symptoms worsen or fail to improve.  The above assessment and management plan was discussed with the patient. The patient verbalized understanding of and has agreed to the management plan. Patient is aware to call the clinic if symptoms fail to improve or worsen. Patient is aware when to  return to the clinic for a follow-up visit. Patient educated on when it is appropriate to go to the emergency department.   Rosaline Bohr, NP-C

## 2024-01-16 ENCOUNTER — Telehealth (HOSPITAL_COMMUNITY): Payer: Self-pay | Admitting: Cardiology

## 2024-01-16 NOTE — Telephone Encounter (Addendum)
 Patient called to request return to work letter. Reports he is living outside and getting meals from [facility]  Please advise

## 2024-01-19 ENCOUNTER — Encounter (HOSPITAL_COMMUNITY): Payer: Self-pay | Admitting: Adult Health

## 2024-01-19 NOTE — Telephone Encounter (Signed)
 Returned call to patient and made him aware  Letter sent to MyChart for patient

## 2024-01-19 NOTE — Telephone Encounter (Signed)
  Thank you for reaching out to SW team.  Please provide work letter to return to work.   Maelys Kinnick NP-C  11:05 AM

## 2024-01-19 NOTE — Telephone Encounter (Signed)
 Received call from patient who states that he needs a letter to return to work as soon as possible. Patient reports that he is living on the streets as he is homeless, and really needs to return to work. Patient scheduled to see us  next Wednesday but reports he needs letter sooner.    Also advised patient of resources from LCSW that are listed in the chart. Patient is aware and verbalized understanding.   Will forward message to provider regarding letter to return to work.

## 2024-01-23 ENCOUNTER — Telehealth (HOSPITAL_COMMUNITY): Payer: Self-pay

## 2024-01-23 NOTE — Telephone Encounter (Signed)
 Called to confirm/remind patient of their appointment at the [facility] on 01/24/24.   Appointment:   [x] Confirmed  [] Left mess   [] No answer/No voice mail  [] VM Full/unable to leave message  [] Phone not in service  Patient reminded to bring all medications and/or complete list.  Confirmed patient has transportation. Gave directions, instructed to utilize valet parking.

## 2024-01-24 ENCOUNTER — Encounter (HOSPITAL_COMMUNITY): Payer: Self-pay

## 2024-01-24 ENCOUNTER — Ambulatory Visit (HOSPITAL_COMMUNITY): Payer: Self-pay | Admitting: Family Medicine

## 2024-01-24 ENCOUNTER — Other Ambulatory Visit (HOSPITAL_COMMUNITY): Payer: Self-pay

## 2024-01-24 ENCOUNTER — Ambulatory Visit (HOSPITAL_COMMUNITY)
Admission: RE | Admit: 2024-01-24 | Discharge: 2024-01-24 | Disposition: A | Discharge status: Home / Self Care | Source: Ambulatory Visit | Attending: Family Medicine | Admitting: Family Medicine

## 2024-01-24 ENCOUNTER — Other Ambulatory Visit: Payer: Self-pay

## 2024-01-24 VITALS — BP 112/58 | HR 85 | Ht 65.0 in | Wt 133.0 lb

## 2024-01-24 DIAGNOSIS — F17210 Nicotine dependence, cigarettes, uncomplicated: Secondary | ICD-10-CM | POA: Diagnosis not present

## 2024-01-24 DIAGNOSIS — Z79899 Other long term (current) drug therapy: Secondary | ICD-10-CM | POA: Insufficient documentation

## 2024-01-24 DIAGNOSIS — Z5986 Financial insecurity: Secondary | ICD-10-CM | POA: Diagnosis not present

## 2024-01-24 DIAGNOSIS — Z139 Encounter for screening, unspecified: Secondary | ICD-10-CM

## 2024-01-24 DIAGNOSIS — Z7982 Long term (current) use of aspirin: Secondary | ICD-10-CM | POA: Insufficient documentation

## 2024-01-24 DIAGNOSIS — I428 Other cardiomyopathies: Secondary | ICD-10-CM | POA: Insufficient documentation

## 2024-01-24 DIAGNOSIS — Z7984 Long term (current) use of oral hypoglycemic drugs: Secondary | ICD-10-CM | POA: Insufficient documentation

## 2024-01-24 DIAGNOSIS — Z720 Tobacco use: Secondary | ICD-10-CM | POA: Diagnosis not present

## 2024-01-24 DIAGNOSIS — I2510 Atherosclerotic heart disease of native coronary artery without angina pectoris: Secondary | ICD-10-CM | POA: Diagnosis not present

## 2024-01-24 DIAGNOSIS — I5022 Chronic systolic (congestive) heart failure: Secondary | ICD-10-CM | POA: Diagnosis present

## 2024-01-24 DIAGNOSIS — I10 Essential (primary) hypertension: Secondary | ICD-10-CM | POA: Diagnosis not present

## 2024-01-24 DIAGNOSIS — Z5900 Homelessness unspecified: Secondary | ICD-10-CM | POA: Diagnosis not present

## 2024-01-24 DIAGNOSIS — I110 Hypertensive heart disease with heart failure: Secondary | ICD-10-CM | POA: Insufficient documentation

## 2024-01-24 LAB — BASIC METABOLIC PANEL WITH GFR
Anion gap: 10 (ref 5–15)
BUN: 20 mg/dL (ref 6–20)
CO2: 21 mmol/L — ABNORMAL LOW (ref 22–32)
Calcium: 8.9 mg/dL (ref 8.9–10.3)
Chloride: 106 mmol/L (ref 98–111)
Creatinine, Ser: 1.2 mg/dL (ref 0.61–1.24)
GFR, Estimated: >60 mL/min (ref >60)
Glucose, Bld: 103 mg/dL — ABNORMAL HIGH (ref 70–99)
Potassium: 4.9 mmol/L (ref 3.5–5.1)
Sodium: 137 mmol/L (ref 135–145)

## 2024-01-24 LAB — BRAIN NATRIURETIC PEPTIDE: B Natriuretic Peptide: 924.6 pg/mL — ABNORMAL HIGH (ref 0.0–100.0)

## 2024-01-24 MED ORDER — METOPROLOL SUCCINATE ER 25 MG PO TB24
12.5000 mg | ORAL_TABLET | Freq: Every day | ORAL | 1 refills | Status: DC
  Filled 2024-01-24 (×2): qty 15, 30d supply, fill #0

## 2024-01-24 NOTE — Patient Instructions (Addendum)
 Good to see you today!  START Toprol  xl 12.5 mg( 1/2 tablet)  every night  Labs done today, your results will be available in MyChart, we will contact you for abnormal readings.  Your physician has requested that you have an echocardiogram. Echocardiography is a painless test that uses sound waves to create images of your heart. It provides your doctor with information about the size and shape of your heart and how well your heart's chambers and valves are working. This procedure takes approximately one hour. There are no restrictions for this procedure. Please do NOT wear cologne, perfume, aftershave, or lotions (deodorant is allowed). Please arrive 15 minutes prior to your appointment time.  Please note: We ask at that you not bring children with you during ultrasound (echo/ vascular) testing. Due to room size and safety concerns, children are not allowed in the ultrasound rooms during exams. Our front office staff cannot provide observation of children in our lobby area while testing is being conducted. An adult accompanying a patient to their appointment will only be allowed in the ultrasound room at the discretion of the ultrasound technician under special circumstances. We apologize for any inconvenience.  Your physician recommends that you schedule a follow-up appointment 3 months with echocardiogram(October ) Call office in August to schedule an appointment  If you have any questions or concerns before your next appointment please send us  a message through [place] or call our office at [phone].    TO LEAVE A MESSAGE FOR THE NURSE SELECT OPTION 2, PLEASE LEAVE A MESSAGE INCLUDING: YOUR NAME DATE OF BIRTH CALL BACK NUMBER REASON FOR CALL**this is important as we prioritize the call backs  YOU WILL RECEIVE A CALL BACK THE SAME DAY AS LONG AS YOU CALL BEFORE 4:00 PM At the [facility], you and your health needs are our priority. As part of our continuing mission to  provide you with exceptional heart care, we have created designated Provider Care Teams. These Care Teams include your primary Cardiologist (physician) and Advanced Practice Providers (APPs- Physician Assistants and Nurse Practitioners) who all work together to provide you with the care you need, when you need it.   You may see any of the following providers on your designated Care Team at your next follow up: Dr Toribio Fuel Dr Ezra Shuck Dr. Ria Commander Dr. Morene Brownie Amy Lenetta, NP Caffie Shed, [place] [facility], [place] Beckey Coe, NP [place] Lee, NP Ellouise Class, NP Tinnie Redman, PharmD Jaun Bash, PharmD   Please be sure to bring in all your medications bottles to every appointment.    Thank you for choosing [facility]

## 2024-01-24 NOTE — Progress Notes (Signed)
 ADVANCED HF CLINIC NOTE   PCP: Celestia Rosaline SQUIBB, NP AHF Cardiologist: Dr. Cherrie  HPI: Jesse Key is a 58 y.o. AA male w/ no prior routine medical care, past medical history of tobacco and alcohol use, and newly diagnosed with systolic heart failure.   Admitted 5/25 with new acute systolic heart failure. Echo showed EF < 20%, normal RV, global HK, mid-mod MR. Diuresed well with IV lasix  and he was started on GDMT. R/LHC showed non-obstructive CAD, EF 25%, well compensated filling pressures with moderate to severely reduced CO (PCW 7, CI 2.07). cMRI showed LVEF 18%, mod dilated LV with septal-lateral dyssynchrony consistent with LBBB and diffuse severe HK. Normal RV, RVEF 35%. Mild-mod MR. Concern for LBBB CM. Discharged, weight 126.5 lbs.  Today he returns for HF follow up. Overall feeling fine. He is not SOB with activity. Works the Counsellor at [employer]. Denies  palpitations, abnormal bleeding, CP, dizziness, edema, or PND/Orthopnea. Appetite ok. Weight at home 134 pounds. Taking all medications. Sleeps on sidewalk and bustop benches. Smokes 1 cig/day, no ETOH or drug use.   No known family h/o heart disease or SCD.   Cardiac Studies - cMRI (5/25): LVEF 18%, septal-lateral dyssynchrony consistent with LBBB, RVEF 35%  - R/LHC 5/25: diffuse non-obstructive CAD; RA 3, PA 33/13 (20), PCWP 7, CO/CI (Fick) 3.4/2.1, PVR 3.8 WU, PAPi 6.7  - Echo 5/25: EF< 20%, RV normal   History reviewed. No pertinent past medical history.  Current Outpatient Medications  Medication Sig Dispense Refill   albuterol  (VENTOLIN  HFA) 108 (90 Base) MCG/ACT inhaler Inhale 2 puffs into the lungs every 6 (six) hours as needed for wheezing or shortness of breath. 6.7 g 0   aspirin  EC 81 MG tablet Take 1 tablet (81 mg total) by mouth daily. Swallow whole. 30 tablet 12   cyclobenzaprine (FLEXERIL) 10 MG tablet Take 10 mg by mouth 2 (two) times daily as needed.     digoxin  (LANOXIN ) 0.125 MG tablet Take 1  tablet (0.125 mg total) by mouth daily. 30 tablet 6   empagliflozin  (JARDIANCE ) 10 MG TABS tablet Take 1 tablet (10 mg total) by mouth daily. 30 tablet 6   furosemide  (LASIX ) 20 MG tablet Take 1 tablet (20 mg total) by mouth once daily as needed. 30 tablet 6   losartan  (COZAAR ) 25 MG tablet Take 1 tablet (25 mg total) by mouth daily. 30 tablet 6   rosuvastatin  (CRESTOR ) 20 MG tablet Take 1 tablet (20 mg total) by mouth daily. 30 tablet 6   spironolactone  (ALDACTONE ) 25 MG tablet Take 1 tablet (25 mg total) by mouth daily. 30 tablet 11   No current facility-administered medications for this encounter.   Allergies  Allergen Reactions   Shellfish Allergy Itching   Social History   Socioeconomic History   Marital status: Single    Spouse name: Not on file   Number of children: Not on file   Years of education: Not on file   Highest education level: Not on file  Occupational History   Not on file  Tobacco Use   Smoking status: Some Days    Types: Cigarettes   Smokeless tobacco: Never  Substance and Sexual Activity   Alcohol use: Not on file   Drug use: Not on file   Sexual activity: Not on file  Other Topics Concern   Not on file  Social History Narrative   Homeless at the time may smoke one cigarette a day   Social Drivers of Health  Financial Resource Strain: High Risk (01/03/2024)   Overall Financial Resource Strain (CARDIA)    Difficulty of Paying Living Expenses: Hard  Food Insecurity: Food Insecurity Present (01/03/2024)   Hunger Vital Sign    Worried About Running Out of Food in the Last Year: Sometimes true    Ran Out of Food in the Last Year: Sometimes true  Transportation Needs: Unmet Transportation Needs (01/03/2024)   PRAPARE - Administrator, Civil Service (Medical): Yes    Lack of Transportation (Non-Medical): Yes  Physical Activity: Not on file  Stress: Not on file  Social Connections: Not on file  Intimate Partner Violence: Not At Risk (11/24/2023)    Humiliation, Afraid, Rape, and Kick questionnaire    Fear of Current or Ex-Partner: No    Emotionally Abused: No    Physically Abused: No    Sexually Abused: No   History reviewed. No pertinent family history.  Wt Readings from Last 3 Encounters:  01/24/24 60.3 kg (133 lb)  01/15/24 59.9 kg (132 lb)  01/08/24 62.8 kg (138 lb 6.4 oz)   BP (!) 112/58   Pulse 85   Ht 5' 5 (1.651 m)   Wt 60.3 kg (133 lb)   SpO2 98%   BMI 22.13 kg/m   PHYSICAL EXAM: General:  NAD. No resp difficulty, walked into clinic HEENT: Normal Neck: Supple. No JVD. Cor: Regular rate & rhythm. No rubs, gallops or murmurs. Lungs: Clear Abdomen: Soft, nontender, nondistended.  Extremities: No cyanosis, clubbing, rash, edema Neuro: Alert & oriented x 3, moves all 4 extremities w/o difficulty. Affect pleasant.   ASSESSMENT & PLAN: Chronic Systolic Heart Failure - NICM - Echo 5/25: EF < 20%, RV nl, global HK, mid-mod MR. LVOT VTI of 7.85 with SV of 25cc concerning for low output heart failure  - Cath 5/25: Non-obstructive CAD EF 25% Well compensated filling pressures with moderate to severely reduced CO (PCW 7, CI 2.07) - cMRI 5/25: LVEF 18%, mod dilated LV with septal-lateral dyssynchrony consistent with LBBB and diffuse severe HK. Normal RV, RVEF 35%. Mild-mod MR.  - NYHA I-II, volume looks good today. - Start Toprol  XL 12.5 mg qhs - Continue Lasix  20 mg PRN - Continue losartan  25 mg daily (plan to switch to Entresto  next if BP allows) - Continue spironolactone  25 mg daily - Continue digoxin  0.125 mg.  - Continue Jardiance  10 mg daily - Repeat Echo in 2-3 months. Consider CRT-D if EF not improved. - Labs today.   CAD - Cath with diffuse non-obs disease - No chest pain - Continue ASA 81 daily. - Continue Crestor  20 mg daily - Needs to stop smoking  HTN - BP stable today - GDMT as above  ETOH/tobacco abuse - Previously drank 2-3 16 oz beers/day. Now none since discharge. Congratulated -  Smokes 1 cig/day, discussed cessation.  5. SDOH - un-housed - Meds thru [facility] pharmacy - LCSW helping with housing resources - Gifted bus pass and food bag today.  Follow up in 3 months with Dr. Cherrie + echo.   Jesse Key [facility] FNP-BC 01/24/24  [facility] [place] [address] Heart and Vascular [place] [address] [phone] (office) [phone] (fax)

## 2024-01-25 ENCOUNTER — Other Ambulatory Visit: Payer: Self-pay

## 2024-01-25 ENCOUNTER — Encounter (HOSPITAL_COMMUNITY): Payer: Self-pay

## 2024-01-25 ENCOUNTER — Other Ambulatory Visit (HOSPITAL_COMMUNITY): Payer: Self-pay

## 2024-01-25 ENCOUNTER — Telehealth (HOSPITAL_COMMUNITY): Payer: Self-pay

## 2024-01-25 MED ORDER — SACUBITRIL-VALSARTAN 24-26 MG PO TABS
1.0000 | ORAL_TABLET | Freq: Two times a day (BID) | ORAL | 3 refills | Status: DC
  Filled 2024-01-25 (×2): qty 60, 30d supply, fill #0

## 2024-01-25 NOTE — Telephone Encounter (Signed)
 Made patient aware that the medication is ready for pick up. Patient aware and verbalized understanding.

## 2024-01-25 NOTE — Telephone Encounter (Signed)
 Patient called back stating that he cannot afford the 4$ copay to get his medication (entresto ) until next Tuesday.   Patient will remain on losartan  until able to get Entresto .   Advised patient I would forward message to our LCSW regarding options to help with this. Patient aware and verbalized understanding.

## 2024-01-25 NOTE — Telephone Encounter (Signed)
 Advanced Heart Failure Patient Advocate Encounter  Test billing for this patients current coverage (Aetna Plus / Healthy Blue) returns a copay of $4 for 90 day supply of Entresto .  Rachel DEL, CPhT Rx Patient Advocate Phone: [phone]

## 2024-01-25 NOTE — Telephone Encounter (Signed)
 Patient called into triage line, stating that he needs letter stating he was at appointment yesterday. Letter sent to MyChart.   Advised patient of results and recommendations as below.   Per Pharmacy team copay 4$ forEntresto .   Advised patient to stop his losartan  and start Entresto    Patient will not start Metoprolol  succinate.   Medication list updated.   Repeat labs ordered and scheduled.   Advised patient to let us  know if he has any issues, he verbalized understanding.

## 2024-01-29 ENCOUNTER — Ambulatory Visit: Payer: Self-pay

## 2024-01-29 NOTE — Telephone Encounter (Signed)
 FYI Only or Action Required?: FYI only for provider.  Patient was last seen in primary care on 01/15/2024 by Celestia Rosaline SQUIBB, NP.  Called Nurse Triage reporting Rash.  Symptoms began a week ago.  Interventions attempted: OTC medications: antifungal cream.  Symptoms are: gradually worsening.  Triage Disposition: See PCP When Office is Open (Within 3 Days)  Patient/caregiver understands and will follow disposition?: Yes      Copied from CRM [id number]. Topic: Clinical - Red Word Triage >> Jan 29, 2024  3:53 PM Shardie S wrote: [facility] that prompted transfer to Nurse Triage: rash with swelling and bleeding-B/L feet Reason for Disposition  Rash has spread beyond the instep and toes  Answer Assessment - Initial Assessment Questions 1. APPEARANCE of RASH: What does the rash look like?      White between toes, and dry patches on top of feet 2. LOCATION: Which part of the foot is involved? Are both feet involved?      Both feet 3. SIZE: How large is the infected area? (Inches or centimeters)      Between toes and top of foot 4. ONSET: When did the rash start?     A week  5. OTHER SYMPTOMS: Do you have any other symptoms? (e.g., fever)     Itching and mild swelling  Protocols used: Athlete's Foot-A-AH

## 2024-01-31 ENCOUNTER — Telehealth (INDEPENDENT_AMBULATORY_CARE_PROVIDER_SITE_OTHER): Payer: Self-pay | Admitting: Primary Care

## 2024-01-31 NOTE — Telephone Encounter (Signed)
 Called pt to confirm appt. Pt did not answer and LVM

## 2024-02-01 ENCOUNTER — Ambulatory Visit (INDEPENDENT_AMBULATORY_CARE_PROVIDER_SITE_OTHER): Admitting: Primary Care

## 2024-02-01 ENCOUNTER — Other Ambulatory Visit (HOSPITAL_COMMUNITY): Payer: Self-pay

## 2024-02-01 ENCOUNTER — Telehealth: Payer: Self-pay | Admitting: *Deleted

## 2024-02-01 ENCOUNTER — Other Ambulatory Visit: Payer: Self-pay

## 2024-02-01 VITALS — BP 127/81 | HR 93 | Resp 16 | Wt 138.2 lb

## 2024-02-01 DIAGNOSIS — B353 Tinea pedis: Secondary | ICD-10-CM | POA: Diagnosis not present

## 2024-02-01 MED ORDER — CLOTRIMAZOLE-BETAMETHASONE 1-0.05 % EX CREA
1.0000 | TOPICAL_CREAM | Freq: Two times a day (BID) | CUTANEOUS | 1 refills | Status: DC
  Filled 2024-02-01: qty 45, 23d supply, fill #0

## 2024-02-01 MED ORDER — HYDROXYZINE HCL 10 MG PO TABS
10.0000 mg | ORAL_TABLET | Freq: Three times a day (TID) | ORAL | 0 refills | Status: DC | PRN
  Filled 2024-02-01: qty 30, 10d supply, fill #0

## 2024-02-01 NOTE — Progress Notes (Signed)
 [facility]  Jesse Key, is a 58 y.o. male  RDW:[id number]  FMW:[id number]  DOB -  rash     Subjective:   Jesse Key is a 58 y.o. male here today for an acute visit. Rash on feet and bilateral legs. He is homeless and has to lay his head where ever is available. Sometimes at the [facility] he stays and there is communicable bathing place. Patient tummy growl the whole visit. Ask he hungry yes- left with a chick fa la sandwich and Gatorade- He was so grateful- the happiness and smile said it all   HPI  No problems updated.  Comprehensive ROS Pertinent positive and negative noted in HPI   Allergies  Allergen Reactions   Shellfish Allergy Itching    No past medical history on file.  Current Outpatient Medications on File Prior to Visit  Medication Sig Dispense Refill   albuterol  (VENTOLIN  HFA) 108 (90 Base) MCG/ACT inhaler Inhale 2 puffs into the lungs every 6 (six) hours as needed for wheezing or shortness of breath. 6.7 g 0   aspirin  EC 81 MG tablet Take 1 tablet (81 mg total) by mouth daily. Swallow whole. 30 tablet 12   cyclobenzaprine (FLEXERIL) 10 MG tablet Take 10 mg by mouth 2 (two) times daily as needed.     digoxin  (LANOXIN ) 0.125 MG tablet Take 1 tablet (0.125 mg total) by mouth daily. 30 tablet 6   empagliflozin  (JARDIANCE ) 10 MG TABS tablet Take 1 tablet (10 mg total) by mouth daily. 30 tablet 6   furosemide  (LASIX ) 20 MG tablet Take 1 tablet (20 mg total) by mouth once daily as needed. 30 tablet 6   rosuvastatin  (CRESTOR ) 20 MG tablet Take 1 tablet (20 mg total) by mouth daily. 30 tablet 6   sacubitril -valsartan  (ENTRESTO ) 24-26 MG Take 1 tablet by mouth 2 (two) times daily. 60 tablet 3   spironolactone  (ALDACTONE ) 25 MG tablet Take 1 tablet (25 mg total) by mouth daily. 30 tablet 11   No current facility-administered medications on file prior to visit.   Health Maintenance  Topic Date Due   Hepatitis C Screening  Never done   DTaP/Tdap/Td  vaccine (1 - Tdap) Never done   Pneumococcal Vaccine for high risk medical condition (1 of 2 - PCV) Never done   Pneumococcal Vaccine for age over 28 (1 of 2 - PCV) Never done   Colon Cancer Screening  Never done   Zoster (Shingles) Vaccine (1 of 2) Never done   COVID-19 Vaccine (1 - 2024-25 season) Never done   Flu Shot  02/02/2024   HIV Screening  Completed   Hepatitis B Vaccine  Aged Out   HPV Vaccine  Aged Out   Meningitis B Vaccine  Aged Out    Objective:  BP 127/81   Pulse 93   Resp 16   Wt 138 lb 3.2 oz (62.7 kg)   SpO2 100%   BMI 23.00 kg/m   Vitals:   02/01/24 1614  BP: 127/81  Pulse: 93  Resp: 16  SpO2: 100%  Weight: 138 lb 3.2 oz (62.7 kg)     Physical Exam Vitals reviewed.  HENT:     Head: Normocephalic.     Right Ear: Tympanic membrane, ear canal and external ear normal.     Left Ear: Tympanic membrane, ear canal and external ear normal.     Nose: Nose normal.     Mouth/Throat:     Comments: Missing teeth  Cardiovascular:  Rate and Rhythm: Normal rate.  Pulmonary:     Effort: Pulmonary effort is normal.     Breath sounds: Normal breath sounds.  Abdominal:     General: Bowel sounds are normal.     Palpations: Abdomen is soft.  Musculoskeletal:        General: Normal range of motion.     Cervical back: Normal range of motion.  Neurological:     Mental Status: He is alert.     Assessment & Plan  [name] was seen today for rash.  Diagnoses and all orders for this visit:  Tinea pedis of both feet -     hydrOXYzine  (ATARAX ) 10 MG tablet; Take 1 tablet (10 mg total) by mouth 3 (three) times daily as needed. -     clotrimazole -betamethasone  (LOTRISONE ) cream; Apply 1 Application topically 2 (two) times daily.        Patient have been counseled extensively about nutrition and exercise. Other issues discussed during this visit include: low cholesterol diet, weight control and daily exercise, foot care, annual eye examinations at  Ophthalmology, importance of adherence with medications and regular follow-up. We also discussed long term complications of uncontrolled diabetes and hypertension.   No follow-ups on file.  The patient was given clear instructions to go to ER or return to medical center if symptoms don't improve, worsen or new problems develop. The patient verbalized understanding. The patient was told to call to get lab results if they haven't heard anything in the next week.   This note has been created with Education officer, environmental. Any transcriptional errors are unintentional.   Rosaline SHAUNNA Bohr, NP 02/05/2024, 2:06 PM

## 2024-02-01 NOTE — Progress Notes (Signed)
 Complex Care Management Note  Care Guide Note 02/01/2024 Name: Jesse Key MRN: [id number] DOB: [birth year]  Jesse Key is a 58 y.o. year old male who sees Celestia Rosaline SQUIBB, NP for primary care. I reached out to Kriss Bethune by phone today to offer complex care management services.  [name] was given information about Complex Care Management services today including:   The Complex Care Management services include support from the care team which includes your Nurse Care Manager, Clinical Social Worker, or Pharmacist.  The Complex Care Management team is here to help remove barriers to the health concerns and goals most important to you. Complex Care Management services are voluntary, and the patient may decline or stop services at any time by request to their care team member.   Complex Care Management Consent Status: Patient agreed to services and verbal consent obtained.   Follow up plan:  Telephone appointment with complex care management team member scheduled for:  02/02/24 with BSW and 02/12/24 with LCSW   Encounter Outcome:  Patient Scheduled  Harlene Satterfield  [facility]  [facility], [facility] Guide  Direct Dial: [phone]  Fax [phone]

## 2024-02-01 NOTE — Progress Notes (Signed)
 Complex Care Management Note Care Guide Note  02/01/2024 Name: Jesse Key MRN: [id number] DOB: Jan 13, 1966   Complex Care Management Outreach Attempts: An unsuccessful telephone outreach was attempted today to offer the patient information about available complex care management services.  Follow Up Plan:  Additional outreach attempts will be made to offer the patient complex care management information and services.   Encounter Outcome:  No Answer  Harlene Satterfield  [facility]  [facility], [facility] Guide  Direct Dial: [phone]  Fax [phone]

## 2024-02-02 ENCOUNTER — Other Ambulatory Visit: Payer: Self-pay | Admitting: Licensed Clinical Social Worker

## 2024-02-02 NOTE — Patient Instructions (Signed)
 Visit Information  Thank you for taking time to visit with me today. Please don't hesitate to contact me if I can be of assistance to you before our next scheduled appointment.  Our next appointment is by telephone on 02/16/2024 at 1:00 pm Please call the care guide team at [phone] if you need to cancel or reschedule your appointment.   Following is a copy of your care plan:   Goals Addressed             This Visit's Progress    BSW VBCI Social Work Care Plan       Problems:   Housing   CSW Clinical Goal(s):   Over the next 2 weeks the Patient will explore community resource options for unmet needs related to Housing .  Interventions:  Social Determinants of Health in Patient with housing : SDOH assessments completed: Housing  Evaluation of current treatment plan related to unmet needs Housing resources:  Patient Goals/Self-Care Activities:  Coordinate with [employer] housing authority and self help to assist with housing .  Plan:   Telephone follow up appointment with care management team member scheduled for:  02/16/2024 at 1:00 pm        Please call the Suicide and Crisis Lifeline: 988 go to [facility] Urgent [facility] [address], [place] [phone]) call 911 if you are experiencing a Mental Health or Behavioral Health Crisis or need someone to talk to.  Patient verbalizes understanding of instructions and care plan provided today and agrees to view in MyChart. Active MyChart status and patient understanding of how to access instructions and care plan via MyChart confirmed with patient.     Tobias CHARM Maranda HEDWIG, PhD [facility], [facility] Social Worker Direct Dial: [phone]  Fax: [phone]

## 2024-02-02 NOTE — Patient Outreach (Signed)
 Complex Care Management   Visit Note  02/02/2024  Name:  Jesse Key MRN: [id number] DOB: 1966/04/02  Situation: Referral received for Complex Care Management related to SDOH Barriers:  Housing patient is homeless I obtained verbal consent from Patient.  Visit completed with patient  on the phone  Background:  No past medical history on file.  Assessment:Patient is currently homeless and has been for over a year. Patient is back working at [employer] on [address] in [place] and is waiting for his first check and he stated that he will get a room for about a week and may do so week by week. Patient became homeless when his father whom he was living with was put in a long term care and he had no where to go, he has family that lives in [place]  ,  and other cities but they don't have room for him. His brother will send him money from time to time. He is single and has no children,. He is currently awaiting his disability approval. The patient will go to [employer] and continue to contact [facility] for housing assistance.    SDOH Interventions    Flowsheet Row Patient Outreach Telephone from 02/02/2024 in [place] [facility] Telephone from 01/08/2024 in [facility] at [facility] of [employer]. [facility] Telephone from 01/03/2024 in [facility] at [employer] [facility] [employer]. [facility]  SDOH Interventions     Food Insecurity Interventions Intervention Not Indicated  [he already goes to the food pantries] -- [employer] Provided  Housing Interventions -- [employer] Provided  [employer] entry] [employer] Provided  Transportation Interventions Intervention Not Indicated  [bus passes from pcp] -- Patient Resources (Friends/Family), Associate Professor, Bus Pass Given  Utilities Interventions Intervention Not Indicated -- Intervention Not Indicated  Financial Strain Interventions Intervention Not Indicated -- Other (Comment)  [works  part time,  currently out of work- referred for disability, transportation resources, housing resources, food resources]  Health Literacy Interventions -- -- Intervention Not Indicated    Recommendation:   none  Follow Up Plan:   Telephone follow up appointment date/time:  02/16/2024 at 1:00 pm  Tobias CHARM Maranda HEDWIG, PhD [facility], [facility] Social Worker Direct Dial: [phone]  Fax: [phone]

## 2024-02-08 ENCOUNTER — Other Ambulatory Visit: Payer: Self-pay

## 2024-02-08 ENCOUNTER — Ambulatory Visit (HOSPITAL_COMMUNITY)
Admission: RE | Admit: 2024-02-08 | Discharge: 2024-02-08 | Disposition: A | Discharge status: Home / Self Care | Source: Ambulatory Visit | Attending: Cardiology | Admitting: Cardiology

## 2024-02-08 DIAGNOSIS — I5022 Chronic systolic (congestive) heart failure: Secondary | ICD-10-CM | POA: Insufficient documentation

## 2024-02-08 LAB — BASIC METABOLIC PANEL WITH GFR
Anion gap: 5 (ref 5–15)
BUN: 17 mg/dL (ref 6–20)
CO2: 23 mmol/L (ref 22–32)
Calcium: 8.7 mg/dL — ABNORMAL LOW (ref 8.9–10.3)
Chloride: 111 mmol/L (ref 98–111)
Creatinine, Ser: 1.29 mg/dL — ABNORMAL HIGH (ref 0.61–1.24)
GFR, Estimated: >60 mL/min (ref >60)
Glucose, Bld: 102 mg/dL — ABNORMAL HIGH (ref 70–99)
Potassium: 4.5 mmol/L (ref 3.5–5.1)
Sodium: 139 mmol/L (ref 135–145)

## 2024-02-09 ENCOUNTER — Ambulatory Visit (HOSPITAL_COMMUNITY): Payer: Self-pay | Admitting: Family Medicine

## 2024-02-12 ENCOUNTER — Other Ambulatory Visit: Payer: Self-pay | Admitting: Licensed Clinical Social Worker

## 2024-02-12 ENCOUNTER — Other Ambulatory Visit: Payer: Self-pay

## 2024-02-12 NOTE — Patient Outreach (Signed)
 Complex Care Management   Visit Note  02/12/2024  Name:  Jesse Key MRN: [id number] DOB: 11/24/1965  Situation: Referral received for Complex Care Management related to SDOH needs: housing resources I obtained verbal consent from Patient.  Visit completed with patient  on the phone  Background:  No past medical history on file.  Assessment: Patient Reported Symptoms:  Cognitive Cognitive Status: Alert and oriented to person, place, and time Cognitive/Intellectual Conditions Management [RPT]: None reported or documented in medical history or problem list   Health Maintenance Behaviors: Annual physical exam, Sleep adequate Health Facilitated by: Rest, Stress management  Neurological Neurological Review of Symptoms: Weakness Neurological Management Strategies: Adequate rest, Coping strategies  HEENT HEENT Symptoms Reported: Sudden change or loss of vision (decreased vision in right eye) HEENT Management Strategies: Coping strategies, Adequate rest    Cardiovascular Cardiovascular Symptoms Reported: Fatigue Cardiovascular Management Strategies: Coping strategies  Respiratory Respiratory Symptoms Reported: Shortness of breath Respiratory Management Strategies: Coping strategies  Endocrine Endocrine Symptoms Reported: Weakness or fatigue, Headaches, Shortness of breath    Gastrointestinal Gastrointestinal Symptoms Reported: No symptoms reported Gastrointestinal Management Strategies: Adequate rest, Coping strategies    Genitourinary Genitourinary Symptoms Reported: Frequency Additional Genitourinary Details: takes Lasix  as prescribed Genitourinary Management Strategies: Adequate rest, Coping strategies  Integumentary Integumentary Symptoms Reported: Rash Additional Integumentary Details: skin issues on feet; has cream to use on feet Skin Management Strategies: Adequate rest, Coping strategies  Musculoskeletal Musculoskelatal Symptoms Reviewed: Muscle pain, Joint pain,  Weakness Musculoskeletal Management Strategies: Adequate rest, Coping strategies      Psychosocial Psychosocial Symptoms Reported: Anxiety - if selected complete GAD, Depression - if selected complete PHQ 2-9, Sadness - if selected complete PHQ 2-9 Additional Psychological Details: homeless; seeking shelter (informed him of local [employer]. Gave him phone number for [facility] (local shelter) Behavioral Management Strategies: Coping strategies, Medication therapy, Counseling Behavioral Health Comment: depression Major Change/Loss/Stressor/Fears (CP): Medical condition, self, Environment, Resources Techniques to [employer] with Loss/Stress/Change: Counseling, Medication, Support group Quality of Family Relationships: unable to assess Do you feel physically threatened by others?: No      02/12/2024    1:50 PM  Depression screen PHQ 2/9  Decreased Interest 1  Down, Depressed, Hopeless 1  PHQ - 2 Score 2  Altered sleeping 2  Tired, decreased energy 1  Change in appetite 1  Feeling bad or failure about yourself  1  Trouble concentrating 1  Moving slowly or fidgety/restless 1  Suicidal thoughts 0  PHQ-9 Score 9  Difficult doing work/chores Somewhat difficult    Vitals:   BP in normal range per client information  Medications Reviewed Today     Reviewed by Frances Ozell GORMAN KEN (Social Worker) on 02/12/24 at 1340  Med List Status: <None>   Medication Order Taking? Sig Documenting Provider Last Dose Status Informant  albuterol  (VENTOLIN  HFA) 108 (90 Base) MCG/ACT inhaler [id number] Not taking currently Inhale 2 puffs into the lungs every 6 (six) hours as needed for wheezing or shortness of breath.  Patient not taking: Reported on 02/12/2024   Toma Matas, MD  Active   aspirin  EC 81 MG tablet [id number] Yes Take 1 tablet (81 mg total) by mouth daily. Swallow whole. [place], [place], FNP  Active   clotrimazole -betamethasone  (LOTRISONE ) cream [id number] Yes Apply 1  Application topically 2 (two) times daily. Celestia Rosaline SQUIBB, NP  Active   cyclobenzaprine (FLEXERIL) 10 MG tablet [id number] Yes Take 10 mg by mouth 2 (two) times daily as needed. [provider]  Active Self, Pharmacy Records  digoxin  (LANOXIN ) 0.125 MG tablet [id number] unknown Take 1 tablet (0.125 mg total) by mouth daily. [place], [place], FNP  Active   empagliflozin  (JARDIANCE ) 10 MG TABS tablet [id number] Yes Take 1 tablet (10 mg total) by mouth daily. [place], [place], [place]  Active   furosemide  (LASIX ) 20 MG tablet [id number] Yes Take 1 tablet (20 mg total) by mouth once daily as needed. [place], [place], [place]  Active   hydrOXYzine  (ATARAX ) 10 MG tablet [id number] Yes Take 1 tablet (10 mg total) by mouth 3 (three) times daily as needed. Celestia Rosaline SQUIBB, NP  Active   rosuvastatin  (CRESTOR ) 20 MG tablet [id number] Yes Take 1 tablet (20 mg total) by mouth daily. [place], Harlene HERO, FNP  Active   sacubitril -valsartan  (ENTRESTO ) 24-26 MG [id number] unknown Take 1 tablet by mouth 2 (two) times daily. [place], Harlene HERO, [place]  Active   spironolactone  (ALDACTONE ) 25 MG tablet [id number] Yes Take 1 tablet (25 mg total) by mouth daily. Bensimhon, Toribio SAUNDERS, MD  Active             Recommendation:   PCP Follow-up Continue Current Plan of Care Take medications as prescribed Call [facility] soon to inquire about temporary housing availability  Work job as scheduled Call LCSW as needed for SW support  Follow Up Plan:  Telephone follow up appointment date/time:   03/20/24 at 10:00 AM   Glendia Pear  MSW, LCSW [place]/Value Based Care [facility] Licensed Clinical Social Worker Direct Dial:  [phone] Fax:  [phone] Website:  [email or web address]

## 2024-02-12 NOTE — Patient Instructions (Signed)
 Visit Information  Thank you for taking time to visit with me today. Please don't hesitate to contact me if I can be of assistance to you before our next scheduled appointment.  Our next appointment is by telephone on 03/20/24 at 10:00 AM   Please call the care guide team at [phone] if you need to cancel or reschedule your appointment.   Following is a copy of your care plan:   Goals Addressed             This Visit's Progress    VBCI Social Work Care Plan       Problems:   Housing issues; homeless             Transport challenges; uses local [employer] bus system             Financial challenges              Pain issues             Decreased family support. He does have siblings who he talks to on the phone but both siblings live out of state  CSW Clinical Goal(s):   Over the next 30 days the Patient will attend all scheduled medical appointments as evidenced by patient report and care team review of appointment completion in electronic medical record.            Over next 30 days, patient will explore housing resources in area such as [facility], [facility], [employer] in order to possibly locate temporary housing for client AEB patient report of finding temporary housing  Interventions:  Discussed client housing needs and status.  He is homeless.  LCSW talked with client about housing resources such as [facility] and [facility].  LCSW gave client the address and phone number for [facility] for client to call. He said he was not familiar with [facility] and would call that shelter to seek temporary housing support              Discussed medication procurement.  Discussed sleeping challenges. He said he has difficulty sleeping             Discussed family support. He has 2 siblings but both siblings live out of state             Discussed program support with client             Discussed client support with NP Rosaline Bohr             Discussed client support with cardiologist              Discussed pain issues of client.  Discussed vision challenges. He said he has decreased vision in right eye             Discussed transport needs. He said he uses [employer] to help with transport as needed              Discussed employment of client. He works about 25 hours per week at local [employer]. He works as a Control and instrumentation engineer level. He said he sometimes is fatigued. He sometimes gets short of breath                Provided counseling support.                Discussed client mood. He sometimes gets sad per  client.  He feels that if he can resolve housing issues then his mood will improve. He said he does like his job at [employer] client to call LCSW as needed for SW support at [phone].               Client was appreciative of call from LCSW today  Patient Goals/Self-Care Activities:  Attend scheduled appointments with NP and with cardiologist              Take medications as prescribed             Work job as scheduled at [employer]. Contact [facility] soon to discuss temporary housing possibly for client at [facility].  Client has phone number for [facility]              Stay in communication with his siblings  Plan:   Telephone follow up appointment with care management team member scheduled for:  03/20/24 at 10:00 AM        Please go to [facility] [address], [place] [phone]) if you are experiencing a Mental Health or Behavioral Health Crisis or need someone to talk to.  The patient verbalized understanding of instructions, educational materials, and care plan provided today and DECLINED offer to receive copy of patient instructions, educational materials, and care plan.    Glendia Pear  MSW, LCSW [place]/Value Based Care [facility] Licensed Clinical Social Worker Direct Dial:  [phone] Fax:  [phone] Website:   [email or web address]

## 2024-02-16 ENCOUNTER — Encounter: Payer: Self-pay | Admitting: Licensed Clinical Social Worker

## 2024-02-16 ENCOUNTER — Other Ambulatory Visit: Payer: Self-pay | Admitting: Licensed Clinical Social Worker

## 2024-02-16 NOTE — Patient Instructions (Signed)
 Visit Information  Thank you for taking time to visit with me today. Please don't hesitate to contact me if I can be of assistance to you before our next scheduled appointment.  Your next care management appointment is by telephone on 03/01/2024 at 11:00 am   Please call the care guide team at [phone] if you need to cancel, schedule, or reschedule an appointment.   Please call the Suicide and Crisis Lifeline: 988 go to [facility] Urgent [facility] [address], [place] [phone]) call 911 if you are experiencing a Mental Health or Behavioral Health Crisis or need someone to talk to.  Tobias CHARM Maranda HEDWIG, PhD [facility], [facility] Social Worker Direct Dial: [phone]  Fax: [phone]

## 2024-02-16 NOTE — Patient Outreach (Signed)
 SW called [employer] and was able to get the patient on th housing list for Monday, patient has to be there by 7am and his name is on the list (with consent of patient). Patient will be served breakfast and assessed for housing stay. SW call patient to provide details from [employer].

## 2024-02-16 NOTE — Patient Outreach (Signed)
 Complex Care Management   Visit Note  02/16/2024  Name:  Jesse Key MRN: [id number] DOB: September 28, 1965  Situation: Referral received for Complex Care Management related to SDOH Barriers:  Housing patient is still homeless I obtained verbal consent from Patient.  Visit completed with patient  on the phone  Background:  No past medical history on file.  Assessment:Patient stated that he is still homeless and also lost his job due to hygiene issues and concerns. Patient is still waiting on disability  and placement at [facility] for a bed. SW will [facility] to [facility] to see about the wait time for a bed.   SDOH Interventions    Flowsheet Row Patient Outreach Telephone from 02/12/2024 in [place] POPULATION HEALTH DEPARTMENT Patient Outreach Telephone from 02/02/2024 in  POPULATION HEALTH DEPARTMENT Telephone from 01/08/2024 in [facility] at [facility] Dept of [employer]. [facility] Telephone from 01/03/2024 in [facility] at [employer] of [employer]. [facility]  SDOH Interventions      Food Insecurity Interventions Intervention Not Indicated Intervention Not Indicated  [he already goes to the food pantries] -- [employer] Provided  Housing Interventions Intervention Not Indicated -- [employer] Provided  [employer] entry] [employer] Provided  Transportation Interventions Intervention Not Indicated Intervention Not Indicated  [bus passes from pcp] -- Patient Resources (Friends/Family), Associate Professor, Bus Pass Given  Utilities Interventions Intervention Not Indicated Intervention Not Indicated -- Intervention Not Indicated  Depression Interventions/Treatment  Counseling -- -- --  Surveyor, quantity Strain Interventions -- Intervention Not Indicated -- Other (Comment)  [works part time,  currently out of work- referred for disability, transportation resources, housing resources, food resources]  Stress Interventions Provide Counseling -- -- --  Health Literacy  Interventions -- -- -- Intervention Not Indicated      Recommendation:   none  Follow Up Plan:   Telephone follow up appointment date/time:  03/01/2024 at 11:00 am  Tobias CHARM Maranda HEDWIG, PhD [facility], [facility] Social Worker Direct Dial: [phone]  Fax: [phone]

## 2024-02-27 ENCOUNTER — Other Ambulatory Visit (INDEPENDENT_AMBULATORY_CARE_PROVIDER_SITE_OTHER): Payer: Self-pay | Admitting: Primary Care

## 2024-02-27 ENCOUNTER — Other Ambulatory Visit (HOSPITAL_COMMUNITY): Payer: Self-pay

## 2024-02-27 DIAGNOSIS — B353 Tinea pedis: Secondary | ICD-10-CM

## 2024-02-27 MED ORDER — HYDROXYZINE HCL 10 MG PO TABS
10.0000 mg | ORAL_TABLET | Freq: Three times a day (TID) | ORAL | 0 refills | Status: DC | PRN
  Filled 2024-02-27: qty 30, 10d supply, fill #0

## 2024-03-01 ENCOUNTER — Encounter: Payer: Self-pay | Admitting: Licensed Clinical Social Worker

## 2024-03-01 ENCOUNTER — Other Ambulatory Visit: Payer: Self-pay | Admitting: Licensed Clinical Social Worker

## 2024-03-08 ENCOUNTER — Other Ambulatory Visit (HOSPITAL_COMMUNITY): Payer: Self-pay

## 2024-03-13 ENCOUNTER — Other Ambulatory Visit (HOSPITAL_COMMUNITY): Payer: Self-pay

## 2024-03-13 ENCOUNTER — Other Ambulatory Visit: Payer: Self-pay

## 2024-03-13 ENCOUNTER — Emergency Department (HOSPITAL_COMMUNITY)

## 2024-03-13 ENCOUNTER — Emergency Department (HOSPITAL_COMMUNITY)
Admission: EM | Admit: 2024-03-13 | Discharge: 2024-03-13 | Disposition: A | Discharge status: Home / Self Care | Attending: Emergency Medicine | Admitting: Emergency Medicine

## 2024-03-13 DIAGNOSIS — I517 Cardiomegaly: Secondary | ICD-10-CM | POA: Diagnosis not present

## 2024-03-13 DIAGNOSIS — I509 Heart failure, unspecified: Secondary | ICD-10-CM | POA: Diagnosis not present

## 2024-03-13 DIAGNOSIS — E8770 Fluid overload, unspecified: Secondary | ICD-10-CM

## 2024-03-13 DIAGNOSIS — U071 COVID-19: Secondary | ICD-10-CM | POA: Diagnosis not present

## 2024-03-13 DIAGNOSIS — J069 Acute upper respiratory infection, unspecified: Secondary | ICD-10-CM

## 2024-03-13 DIAGNOSIS — J929 Pleural plaque without asbestos: Secondary | ICD-10-CM | POA: Diagnosis not present

## 2024-03-13 DIAGNOSIS — R059 Cough, unspecified: Secondary | ICD-10-CM | POA: Diagnosis not present

## 2024-03-13 DIAGNOSIS — I5021 Acute systolic (congestive) heart failure: Secondary | ICD-10-CM | POA: Diagnosis not present

## 2024-03-13 DIAGNOSIS — R0602 Shortness of breath: Secondary | ICD-10-CM | POA: Diagnosis not present

## 2024-03-13 DIAGNOSIS — I110 Hypertensive heart disease with heart failure: Secondary | ICD-10-CM | POA: Diagnosis not present

## 2024-03-13 LAB — BASIC METABOLIC PANEL WITH GFR
Anion gap: 12 (ref 5–15)
BUN: 17 mg/dL (ref 6–20)
CO2: 20 mmol/L — ABNORMAL LOW (ref 22–32)
Calcium: 9 mg/dL (ref 8.9–10.3)
Chloride: 108 mmol/L (ref 98–111)
Creatinine, Ser: 1.5 mg/dL — ABNORMAL HIGH (ref 0.61–1.24)
GFR, Estimated: 53 mL/min — ABNORMAL LOW (ref >60)
Glucose, Bld: 96 mg/dL (ref 70–99)
Potassium: 5.3 mmol/L — ABNORMAL HIGH (ref 3.5–5.1)
Sodium: 140 mmol/L (ref 135–145)

## 2024-03-13 LAB — DIGOXIN LEVEL: Digoxin Level: <0.6 ng/mL — ABNORMAL LOW (ref 0.8–2.0)

## 2024-03-13 LAB — CBC WITH DIFFERENTIAL/PLATELET
Abs Immature Granulocytes: 0.04 K/uL (ref 0.00–0.07)
Basophils Absolute: 0.1 K/uL (ref 0.0–0.1)
Basophils Relative: 1 %
Eosinophils Absolute: 0.1 K/uL (ref 0.0–0.5)
Eosinophils Relative: 1 %
HCT: 44.3 % (ref 39.0–52.0)
Hemoglobin: 13.6 g/dL (ref 13.0–17.0)
Immature Granulocytes: 0 %
Lymphocytes Relative: 26 %
Lymphs Abs: 2.8 K/uL (ref 0.7–4.0)
MCH: 23.2 pg — ABNORMAL LOW (ref 26.0–34.0)
MCHC: 30.7 g/dL (ref 30.0–36.0)
MCV: 75.6 fL — ABNORMAL LOW (ref 80.0–100.0)
Monocytes Absolute: 1.1 K/uL — ABNORMAL HIGH (ref 0.1–1.0)
Monocytes Relative: 10 %
Neutro Abs: 6.7 K/uL (ref 1.7–7.7)
Neutrophils Relative %: 62 %
Platelets: 217 K/uL (ref 150–400)
RBC: 5.86 MIL/uL — ABNORMAL HIGH (ref 4.22–5.81)
RDW: 16 % — ABNORMAL HIGH (ref 11.5–15.5)
WBC: 10.7 K/uL — ABNORMAL HIGH (ref 4.0–10.5)
nRBC: 0 % (ref 0.0–0.2)

## 2024-03-13 LAB — BRAIN NATRIURETIC PEPTIDE: B Natriuretic Peptide: >4500 pg/mL — ABNORMAL HIGH (ref 0.0–100.0)

## 2024-03-13 LAB — RESP PANEL BY RT-PCR (RSV, FLU A&B, COVID)  RVPGX2
Influenza A by PCR: NEGATIVE
Influenza B by PCR: NEGATIVE
Resp Syncytial Virus by PCR: NEGATIVE
SARS Coronavirus 2 by RT PCR: POSITIVE — AB

## 2024-03-13 MED ORDER — ALBUTEROL SULFATE HFA 108 (90 BASE) MCG/ACT IN AERS: 1.0000 | INHALATION_SPRAY | Freq: Four times a day (QID) | RESPIRATORY_TRACT | 1 refills | Status: DC | PRN

## 2024-03-13 MED ORDER — LOSARTAN POTASSIUM 25 MG PO TABS: 25.0000 mg | ORAL_TABLET | Freq: Every day | ORAL | 1 refills | Status: DC

## 2024-03-13 MED ORDER — ASPIRIN 81 MG PO TBEC
81.0000 mg | DELAYED_RELEASE_TABLET | Freq: Every day | ORAL | 12 refills | Status: DC
  Filled 2024-03-13: qty 30, 30d supply, fill #0
  Filled 2024-04-17: qty 30, 30d supply, fill #1

## 2024-03-13 MED ORDER — LOSARTAN POTASSIUM 25 MG PO TABS
25.0000 mg | ORAL_TABLET | Freq: Every day | ORAL | 1 refills | Status: DC
  Filled 2024-03-13 (×2): qty 30, 30d supply, fill #0

## 2024-03-13 MED ORDER — ROSUVASTATIN CALCIUM 20 MG PO TABS
20.0000 mg | ORAL_TABLET | Freq: Every day | ORAL | 1 refills | Status: DC
  Filled 2024-03-13 (×2): qty 30, 30d supply, fill #0
  Filled 2024-04-17: qty 30, 30d supply, fill #1

## 2024-03-13 MED ORDER — ROSUVASTATIN CALCIUM 20 MG PO TABS: 20.0000 mg | ORAL_TABLET | Freq: Every day | ORAL | 1 refills | Status: DC

## 2024-03-13 MED ORDER — EMPAGLIFLOZIN 10 MG PO TABS: 10.0000 mg | ORAL_TABLET | Freq: Every day | ORAL | 1 refills | Status: DC

## 2024-03-13 MED ORDER — ALBUTEROL SULFATE HFA 108 (90 BASE) MCG/ACT IN AERS
1.0000 | INHALATION_SPRAY | Freq: Four times a day (QID) | RESPIRATORY_TRACT | 1 refills | Status: DC | PRN
  Filled 2024-03-13: qty 18, 30d supply, fill #0
  Filled 2024-03-13: qty 18, 17d supply, fill #0
  Filled 2024-06-11: qty 6.7, 30d supply, fill #1

## 2024-03-13 MED ORDER — EMPAGLIFLOZIN 10 MG PO TABS
10.0000 mg | ORAL_TABLET | Freq: Every day | ORAL | 1 refills | Status: DC
  Filled 2024-03-13 (×2): qty 30, 30d supply, fill #0
  Filled 2024-04-17: qty 30, 30d supply, fill #1

## 2024-03-13 MED ORDER — DIGOXIN 125 MCG PO TABS
0.1250 mg | ORAL_TABLET | Freq: Every day | ORAL | 1 refills | Status: DC
  Filled 2024-03-13 (×2): qty 30, 30d supply, fill #0

## 2024-03-13 MED ORDER — FUROSEMIDE 10 MG/ML IJ SOLN
40.0000 mg | Freq: Once | INTRAMUSCULAR | Status: AC
  Administered 2024-03-13: 40 mg via INTRAVENOUS
  Filled 2024-03-13: qty 4

## 2024-03-13 MED ORDER — FUROSEMIDE 20 MG PO TABS: 20.0000 mg | ORAL_TABLET | Freq: Every day | ORAL | 1 refills | Status: DC

## 2024-03-13 MED ORDER — METOPROLOL SUCCINATE ER 25 MG PO TB24
12.5000 mg | ORAL_TABLET | Freq: Every day | ORAL | 1 refills | Status: DC
  Filled 2024-03-13 (×2): qty 15, 30d supply, fill #0
  Filled 2024-04-17: qty 15, 30d supply, fill #1

## 2024-03-13 MED ORDER — METOPROLOL SUCCINATE ER 25 MG PO TB24: 12.5000 mg | ORAL_TABLET | Freq: Every day | ORAL | 1 refills | Status: DC

## 2024-03-13 MED ORDER — FUROSEMIDE 20 MG PO TABS
20.0000 mg | ORAL_TABLET | Freq: Every day | ORAL | 1 refills | Status: DC
  Filled 2024-03-13 (×2): qty 30, 30d supply, fill #0

## 2024-03-13 MED ORDER — ASPIRIN 81 MG PO TBEC: 81.0000 mg | DELAYED_RELEASE_TABLET | Freq: Every day | ORAL | 12 refills | Status: DC

## 2024-03-13 MED ORDER — DIGOXIN 125 MCG PO TABS: 0.1250 mg | ORAL_TABLET | Freq: Every day | ORAL | 1 refills | Status: DC

## 2024-03-13 NOTE — Discharge Instructions (Signed)
 You do have COVID-19.  This is probably causing some of your symptoms.  I did refill all of your medications.  Please take this as prescribed.  If you have any kind chest pain or shortness of breath going forward, please come back to the ED.  Please give your cardiologist call to see them again in clinic
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
## 2024-03-13 NOTE — ED Notes (Signed)
 Pt verbalized understanding of discharge instructions. Opportunity for questions provided.   Pt requested a bus pass.  No passes at [place].  Pt directed to fishbowl in the lobby.   Delay in discharge d/t having to chance the Pharmacy and additional Pt requests.

## 2024-03-13 NOTE — ED Provider Notes (Signed)
 [place] EMERGENCY DEPARTMENT AT [facility] Provider Note   CSN: [id number] Arrival date & time: 03/13/24  9770     Patient presents with: Cough (BNP=>4,500 Covid+)   Jesse Key is a 58 y.o. male.   The history is provided by the patient.   Patient with history of nonischemic cardiomyopathy presents with shortness of breath.  Patient reports he was going to work at [employer] over 2 days ago when he started having cough and shortness of breath.  He reports orthopnea and dyspnea on exertion He reports chest discomfort with cough.  No hemoptysis.  No fevers.  He does report sick contacts.  No lower extremity edema.  He reports a history of congestive heart failure.  He has been without his medications for the past 2 weeks.  He is a current smoker     Prior to Admission medications   Medication Sig Start Date End Date Taking? Authorizing Provider  albuterol  (PROVENTIL  HFA;VENTOLIN  HFA) 108 (90 Base) MCG/ACT inhaler Inhale 1-2 puffs into the lungs every 6 (six) hours as needed for wheezing or shortness of breath. 04/09/17   Olympia Gee, PA-C  cyclobenzaprine  (FLEXERIL ) 10 MG tablet Take 1 tablet (10 mg total) by mouth 2 (two) times daily as needed for muscle spasms. 11/17/23   Laurice Maude BROCKS, MD  naproxen sodium (ALEVE) 220 MG tablet Take 440 mg by mouth every 4 (four) hours as needed (for pain).    [provider]    Allergies: Shrimp [shellfish allergy]    Review of Systems  Constitutional:  Negative for fever.  Respiratory:  Positive for cough and shortness of breath.   Cardiovascular:  Negative for leg swelling.    Updated Vital Signs BP 126/79   Pulse 81   Temp 98.2 F (36.8 C) (Oral)   Resp 16   Ht 1.676 m (5' 6)   Wt 65.8 kg   SpO2 100%   BMI 23.41 kg/m   Physical Exam CONSTITUTIONAL: Well developed/well nourished, no acute distress HEAD: Normocephalic/atraumatic EYES: EOMI/PERRL ENMT: Mucous membranes moist NECK: supple no  meningeal signs,+ JVD SPINE/BACK:entire spine nontender CV: S1/S2 noted, no murmurs/rubs/gallops noted LUNGS: Decreased breath sounds in the bases, otherwise clear and no distress noted ABDOMEN: soft, nontender NEURO: Pt is awake/alert/appropriate, moves all extremitiesx4.  No facial droop.   EXTREMITIES: pulses normal/equal, full ROM, no lower extremity edema SKIN: warm, color normal PSYCH: no abnormalities of mood noted, alert and oriented to situation  (all labs ordered are listed, but only abnormal results are displayed) Labs Reviewed  RESP PANEL BY RT-PCR (RSV, FLU A&B, COVID)  RVPGX2 - Abnormal; Notable for the following components:      Result Value   SARS Coronavirus 2 by RT PCR POSITIVE (*)    All other components within normal limits  CBC WITH DIFFERENTIAL/PLATELET - Abnormal; Notable for the following components:   WBC 10.7 (*)    RBC 5.86 (*)    MCV 75.6 (*)    MCH 23.2 (*)    RDW 16.0 (*)    Monocytes Absolute 1.1 (*)    All other components within normal limits  BASIC METABOLIC PANEL WITH GFR - Abnormal; Notable for the following components:   Potassium 5.3 (*)    CO2 20 (*)    Creatinine, Ser 1.50 (*)    GFR, Estimated 53 (*)    All other components within normal limits  BRAIN NATRIURETIC PEPTIDE - Abnormal; Notable for the following components:   B Natriuretic Peptide >  4,500.0 (*)    All other components within normal limits  DIGOXIN  LEVEL    EKG: EKG Interpretation Date/Time:  Wednesday March 13 2024 03:00:40 EDT Ventricular Rate:  85 PR Interval:  194 QRS Duration:  150 QT Interval:  422 QTC Calculation: 502 R Axis:   108  Text Interpretation: Normal sinus rhythm Possible Left atrial enlargement Left ventricular hypertrophy with QRS widening and repolarization abnormality ( Cornell product ) Possible Lateral infarct , age undetermined Abnormal ECG No previous ECGs available Confirmed by Midge Golas ([id number]) on 03/13/2024 5:18:21  AM  Radiology: DG Chest 2 View Result Date: 03/13/2024 EXAM: 2 VIEW(S) XRAY OF THE CHEST 03/13/2024 03:21:00 AM COMPARISON: 11/23/2023 CLINICAL HISTORY: SOB. Encounter for shortness of breath. FINDINGS: LUNGS AND PLEURA: Interlobular septal thickening. No focal pulmonary opacity. No pulmonary edema. No pleural effusion. No pneumothorax. HEART AND MEDIASTINUM: Cardiomegaly, unchanged. BONES AND SOFT TISSUES: No acute osseous abnormality. IMPRESSION: 1. Interlobular septal thickening, favor edema . 2. Cardiomegaly, unchanged. Electronically signed by: Norman Gatlin MD 03/13/2024 03:30 AM EDT RP Workstation: [id number]     Procedures   Medications Ordered in the ED  furosemide  (LASIX ) injection 40 mg (40 mg Intravenous Given 03/13/24 0631)    Clinical Course as of 03/13/24 0718  Wed Mar 13, 2024  0613 Patient with known history of nonischemic cardiomyopathy presents with shortness of breath.  Patient was registered under different MRN, but was able to find his other records.  He is on digoxin .  He has had previous cardiac workup.  Patient likely has combination of CHF exacerbation and COVID-19. However nursing staff reports he was able to ambulate without difficulty Will monitor, diuresis and reassess [DW]  [phone] Signed out to dr jackquline - check digoxin  level, reassess after diuresis  [DW]    Clinical Course User Index [DW] Midge Golas, MD                                 Medical Decision Making Amount and/or Complexity of Data Reviewed Labs: ordered.  Risk Prescription drug management.   This patient presents to the ED for concern of shortness of breath, this involves an extensive number of treatment options, and is a complaint that carries with it a high risk of complications and morbidity.  The differential diagnosis includes but is not limited to Acute coronary syndrome, pneumonia, acute pulmonary edema, pneumothorax, acute anemia, pulmonary embolism COVID-19,  influenza  Comorbidities that complicate the patient evaluation: Patient's presentation is complicated by their history of CHF  Social Determinants of Health: Patient's tobacco use  increases the complexity of managing their presentation  Additional history obtained: Records reviewed previous admission documents  Lab Tests: I Ordered, and personally interpreted labs.  The pertinent results include: Renal insufficiency  Imaging Studies ordered: I ordered imaging studies including X-ray chest  I independently visualized and interpreted imaging which showed pulmonary edema I agree with the radiologist interpretation  Cardiac Monitoring: The patient was maintained on a cardiac monitor.  I personally viewed and interpreted the cardiac monitor which showed an underlying rhythm of:  sinus rhythm  Medicines ordered and prescription drug management: I ordered medication including Lasix  for CHF  Complexity of problems addressed: Patient's presentation is most consistent with  acute presentation with potential threat to life or bodily function       Final diagnoses:  COVID-19  Acute systolic congestive heart failure [facility])    ED Discharge Orders  None          Midge Golas, MD 03/13/24 [phone]

## 2024-03-13 NOTE — Progress Notes (Signed)
 Heart Failure Navigator Progress Note  Assessed for Heart & Vascular TOC clinic readiness.  Patient discharged home from ED with out admission, Covid + and . Acute systolic congestive heart failure. Per MD patient to follow up with his cardiologist.    Navigator available for reassessment of patient.   Stephane Haddock, BSN, Scientist, clinical (histocompatibility and immunogenetics) Only

## 2024-03-13 NOTE — ED Triage Notes (Signed)
 Called EMS for [facility] but found to have a consistent productive cough, nasal congestion. +sick contacts.   Sats good. Lungs clear.

## 2024-03-13 NOTE — ED Provider Triage Note (Signed)
 Emergency Medicine Provider Triage Evaluation Note  Jesse Key , a 58 y.o. male  was evaluated in triage.  Pt complains of SOB and cough. Symptoms began a few days ago. First noted some increased SOB with exertion at work. Does report hx of CHF. Reports 10 pound weight gain recently. No new or worsening orthopnea. Has had some BLE edema which improves with elevation.  Review of Systems  Positive: As above Negative: As above  Physical Exam  BP 117/85 (BP Location: Right Arm)   Pulse 84   Temp 98.4 F (36.9 C) (Oral)   Resp 16   Ht 5' 6 (1.676 m)   Wt 65.8 kg   SpO2 100%   BMI 23.41 kg/m  Gen:   Awake, no distress   Resp:  Normal effort  MSK:   Moves extremities without difficulty  Other:  Decreased BS b/l bases. No wheezing noted. No significant pitting edema BLE.  Medical Decision Making  Medically screening exam initiated at 2:52 AM.  Appropriate orders placed.  Jesse Key was informed that the remainder of the evaluation will be completed by another provider, this initial triage assessment does not replace that evaluation, and the importance of remaining in the ED until their evaluation is complete.  SOB and cough - work up initiated. No hypoxemia in triage. VSS.   Jesse Sor, PA-C 03/13/24 [phone]

## 2024-03-15 ENCOUNTER — Other Ambulatory Visit: Payer: Self-pay | Admitting: Licensed Clinical Social Worker

## 2024-03-15 NOTE — Patient Outreach (Signed)
 Complex Care Management   Visit Note  03/15/2024  Name:  Jesse Key MRN: [id number] DOB: 1965-12-25  Situation: Referral received for Complex Care Management related to SDOH Barriers:  Housing not homeless any longer  I obtained verbal consent from Patient.  Visit completed with Patient  on the phone  Background:  No past medical history on file.  Assessment:Patient stated that he ment to call and update the SW that he is now living with his cousin and working back at [employer] on [address]. No other SDOH Needs. SW reminded the patient about and upcoming appointment with the LCSW on 03/20/2024 at 10:00 am. Patient also stated that he received a call from Disability and has an appointment on 03/19/2024.   SDOH Interventions    Flowsheet Row Patient Outreach Telephone from 02/12/2024 in [place] POPULATION HEALTH DEPARTMENT Patient Outreach Telephone from 02/02/2024 in [place] POPULATION HEALTH DEPARTMENT Telephone from 01/08/2024 in [facility] at [facility] of [employer] Telephone from 01/03/2024 in [facility] at [employer] [facility] [employer]. [facility]  SDOH Interventions      Food Insecurity Interventions Intervention Not Indicated Intervention Not Indicated  [he already goes to the food pantries] -- [employer] Provided  Housing Interventions Intervention Not Indicated -- [employer] Provided  [employer] entry] [employer] Provided  Transportation Interventions Intervention Not Indicated Intervention Not Indicated  [bus passes from pcp] -- Patient Resources (Friends/Family), Associate Professor, Bus Pass Given  Utilities Interventions Intervention Not Indicated Intervention Not Indicated -- Intervention Not Indicated  Depression Interventions/Treatment  Counseling -- -- --  Surveyor, quantity Strain Interventions -- Intervention Not Indicated -- Other (Comment)  [works part time,  currently out of work- referred for disability, transportation resources,  housing resources, food resources]  Stress Interventions Provide Counseling -- -- --  Health Literacy Interventions -- -- -- Intervention Not Indicated    Recommendation:   none  Follow Up Plan:   Closing From:  Complex Care Management  Tobias CHARM Maranda HEDWIG, PhD [facility]  [facility] Social Worker Direct Dial: [phone]  Fax: [phone]

## 2024-03-15 NOTE — Progress Notes (Incomplete)
 ADVANCED HF CLINIC NOTE   PCP: Celestia Rosaline SQUIBB, NP AHF Cardiologist: Dr. Cherrie  HPI: Jesse Key is a 58 y.o. AA male w/ no prior routine medical care, past medical history of tobacco and alcohol use, and newly diagnosed with systolic heart failure.   Admitted 5/25 with new acute systolic heart failure. Echo showed EF < 20%, normal RV, global HK, mid-mod MR. Diuresed well with IV lasix  and he was started on GDMT. R/LHC showed non-obstructive CAD, EF 25%, well compensated filling pressures with moderate to severely reduced CO (PCW 7, CI 2.07). cMRI showed LVEF 18%, mod dilated LV with septal-lateral dyssynchrony consistent with LBBB and diffuse severe HK. Normal RV, RVEF 35%. Mild-mod MR. Concern for LBBB CM. Discharged, weight 126.5 lbs.  Today he returns for HF follow up. Overall feeling fine. He is not SOB with activity. Works the Counsellor at [employer]. Denies  palpitations, abnormal bleeding, CP, dizziness, edema, or PND/Orthopnea. Appetite ok. Weight at home 134 pounds. Taking all medications. Sleeps on sidewalk and bustop benches. Smokes 1 cig/day, no ETOH or drug use.   No known family h/o heart disease or SCD.   Cardiac Studies - cMRI (5/25): LVEF 18%, septal-lateral dyssynchrony consistent with LBBB, RVEF 35%  - R/LHC 5/25: diffuse non-obstructive CAD; RA 3, PA 33/13 (20), PCWP 7, CO/CI (Fick) 3.4/2.1, PVR 3.8 WU, PAPi 6.7  - Echo 5/25: EF< 20%, RV normal   No past medical history on file.  Current Outpatient Medications  Medication Sig Dispense Refill   albuterol  (VENTOLIN  HFA) 108 (90 Base) MCG/ACT inhaler Inhale 2 puffs into the lungs every 6 (six) hours as needed for wheezing or shortness of breath. (Patient not taking: Reported on 02/12/2024) 6.7 g 0   aspirin  EC 81 MG tablet Take 1 tablet (81 mg total) by mouth daily. Swallow whole. 30 tablet 12   clotrimazole -betamethasone  (LOTRISONE ) cream Apply 1 Application topically 2 (two) times daily. 45 g 1    cyclobenzaprine  (FLEXERIL ) 10 MG tablet Take 10 mg by mouth 2 (two) times daily as needed.     digoxin  (LANOXIN ) 0.125 MG tablet Take 1 tablet (0.125 mg total) by mouth daily. 30 tablet 6   empagliflozin  (JARDIANCE ) 10 MG TABS tablet Take 1 tablet (10 mg total) by mouth daily. 30 tablet 6   furosemide  (LASIX ) 20 MG tablet Take 1 tablet (20 mg total) by mouth once daily as needed. 30 tablet 6   hydrOXYzine  (ATARAX ) 10 MG tablet Take 1 tablet (10 mg total) by mouth 3 (three) times daily as needed. 30 tablet 0   rosuvastatin  (CRESTOR ) 20 MG tablet Take 1 tablet (20 mg total) by mouth daily. 30 tablet 6   sacubitril -valsartan  (ENTRESTO ) 24-26 MG Take 1 tablet by mouth 2 (two) times daily. 60 tablet 3   spironolactone  (ALDACTONE ) 25 MG tablet Take 1 tablet (25 mg total) by mouth daily. 30 tablet 11   No current facility-administered medications for this visit.   Allergies  Allergen Reactions   Shellfish Allergy Itching   Social History   Socioeconomic History   Marital status: Single    Spouse name: Not on file   Number of children: Not on file   Years of education: Not on file   Highest education level: Not on file  Occupational History   Not on file  Tobacco Use   Smoking status: Some Days    Types: Cigarettes   Smokeless tobacco: Never  Substance and Sexual Activity   Alcohol use: Not on file  Drug use: Not on file   Sexual activity: Not on file  Other Topics Concern   Not on file  Social History Narrative   Homeless at the time may smoke one cigarette a day   Social Drivers of Health   Financial Resource Strain: High Risk (02/02/2024)   Overall Financial Resource Strain (CARDIA)    Difficulty of Paying Living Expenses: Hard  Food Insecurity: Food Insecurity Present (02/12/2024)   Hunger Vital Sign    Worried About Running Out of Food in the Last Year: Sometimes true    Ran Out of Food in the Last Year: Sometimes true  Transportation Needs: No Transportation Needs  (02/12/2024)   PRAPARE - Administrator, Civil Service (Medical): No    Lack of Transportation (Non-Medical): No  Recent Concern: Transportation Needs - Unmet Transportation Needs (01/03/2024)   PRAPARE - Transportation    Lack of Transportation (Medical): Yes    Lack of Transportation (Non-Medical): Yes  Physical Activity: Not on file  Stress: Stress Concern Present (02/12/2024)   [employer] of Occupational Health - Occupational Stress Questionnaire    Feeling of Stress: To some extent  Social Connections: Not on file  Intimate Partner Violence: Not At Risk (02/12/2024)   Humiliation, Afraid, Rape, and Kick questionnaire    Fear of Current or Ex-Partner: No    Emotionally Abused: No    Physically Abused: No    Sexually Abused: No   No family history on file.  Wt Readings from Last 3 Encounters:  02/01/24 62.7 kg (138 lb 3.2 oz)  01/24/24 60.3 kg (133 lb)  01/15/24 59.9 kg (132 lb)   There were no vitals taken for this visit.  PHYSICAL EXAM: General:  NAD. No resp difficulty, walked into clinic HEENT: Normal Neck: Supple. No JVD. Cor: Regular rate & rhythm. No rubs, gallops or murmurs. Lungs: Clear Abdomen: Soft, nontender, nondistended.  Extremities: No cyanosis, clubbing, rash, edema Neuro: Alert & oriented x 3, moves all 4 extremities w/o difficulty. Affect pleasant.   ASSESSMENT & PLAN: Chronic Systolic Heart Failure - NICM - Echo 5/25: EF < 20%, RV nl, global HK, mid-mod MR. LVOT VTI of 7.85 with SV of 25cc concerning for low output heart failure  - Cath 5/25: Non-obstructive CAD EF 25% Well compensated filling pressures with moderate to severely reduced CO (PCW 7, CI 2.07) - cMRI 5/25: LVEF 18%, mod dilated LV with septal-lateral dyssynchrony consistent with LBBB and diffuse severe HK. Normal RV, RVEF 35%. Mild-mod MR.  - NYHA I-II, volume looks good today. - Start Toprol  XL 12.5 mg qhs - Continue Lasix  20 mg PRN - Continue losartan  25 mg daily (plan  to switch to Entresto  next if BP allows) - Continue spironolactone  25 mg daily - Continue digoxin  0.125 mg.  - Continue Jardiance  10 mg daily - Repeat Echo in 2-3 months. Consider CRT-D if EF not improved. - Labs today.   CAD - Cath with diffuse non-obs disease - No chest pain - Continue ASA 81 daily. - Continue Crestor  20 mg daily - Needs to stop smoking  HTN - BP stable today - GDMT as above  ETOH/tobacco abuse - Previously drank 2-3 16 oz beers/day. Now none since discharge. Congratulated - Smokes 1 cig/day, discussed cessation.  5. SDOH - un-housed - Meds thru [facility] pharmacy - LCSW helping with housing resources - [employer] bus pass and food bag today.  Follow up in 3 months with Dr. Cherrie + echo.   Harlene HERO [facility]  FNP-BC 03/15/24  Advanced Heart Failure Clinic [facility] [address] Heart and Vascular [place] [address] [phone] (office) [phone] (fax)

## 2024-03-15 NOTE — Patient Instructions (Signed)
 Visit Information  Thank you for taking time to visit with me today. Please don't hesitate to contact me if I can be of assistance to you before our next scheduled appointment.  Your next care management appointment is no further scheduled appointments.  on     Please call the care guide team at [phone] if you need to cancel, schedule, or reschedule an appointment.   Please call the Suicide and Crisis Lifeline: 988 go to [facility] Urgent [facility] [address], [place] [phone]) call 911 if you are experiencing a Mental Health or Behavioral Health Crisis or need someone to talk to.  Jesse Key Jesse Key Jesse Key HEDWIG, PhD [facility], [facility] Social Worker Direct Dial: [phone]  Fax: [phone]

## 2024-03-18 ENCOUNTER — Encounter (INDEPENDENT_AMBULATORY_CARE_PROVIDER_SITE_OTHER): Payer: Self-pay | Admitting: Primary Care

## 2024-03-19 ENCOUNTER — Telehealth (HOSPITAL_COMMUNITY): Payer: Self-pay

## 2024-03-19 NOTE — Telephone Encounter (Signed)
 Called to confirm/remind patient of their appointment at the Advanced Heart Failure Clinic on 03/20/24  Appointment:   [] Confirmed  [x] Left mess   [] No answer/No voice mail  [] VM Full/unable to leave message  [] Phone not in service  And to bring in all medications and/or complete list.

## 2024-03-20 ENCOUNTER — Other Ambulatory Visit: Payer: Self-pay | Admitting: Licensed Clinical Social Worker

## 2024-03-20 ENCOUNTER — Encounter (HOSPITAL_COMMUNITY)

## 2024-03-20 NOTE — Patient Outreach (Signed)
 Complex Care Management   Visit Note  03/20/2024  Name:  Jesse Key MRN: [id number] DOB: Feb 13, 1966  Situation: Referral received for Complex Care Management related to SDOH needs; housing needs I obtained verbal consent from Patient.  Visit completed with Patient  on the phone  Background:  Heart Failure; ETOH abuse; Pulmonary edema; HTN; SOB .  Assessment: Patient Reported Symptoms:  Cognitive Cognitive Status: Alert and oriented to person, place, and time, Difficulties with attention and concentration Cognitive/Intellectual Conditions Management [RPT]: None reported or documented in medical history or problem list   Health Maintenance Behaviors: Stress management Health Facilitated by: Stress management  Neurological Neurological Review of Symptoms: Weakness Neurological Management Strategies: Adequate rest, Coping strategies  HEENT HEENT Symptoms Reported: Sudden change or loss of vision HEENT Management Strategies: Adequate rest, Coping strategies    Cardiovascular Cardiovascular Symptoms Reported: Fatigue; HTN Cardiovascular Management Strategies: Coping strategies  Respiratory Respiratory Symptoms Reported: Shortness of breath Other Respiratory Symptoms: shortness of breath Respiratory Management Strategies: Coping strategies  Endocrine Endocrine Symptoms Reported: Weakness or fatigue, Shakiness, Blurry vision, Shortness of breath, Nausea or vomiting    Gastrointestinal Gastrointestinal Symptoms Reported: Abdominal pain or discomfort Additional Gastrointestinal Details: Reports recent COVID diagnosis Gastrointestinal Management Strategies: Adequate rest, Coping strategies    Genitourinary Genitourinary Symptoms Reported: Frequency Additional Genitourinary Details: takes Lasix  as prescribed Genitourinary Management Strategies: Adequate rest  Integumentary Integumentary Symptoms Reported: Rash Skin Management Strategies: Adequate rest  Musculoskeletal Musculoskelatal  Symptoms Reviewed: Muscle pain, Joint pain Musculoskeletal Management Strategies: Coping strategies      Psychosocial Psychosocial Symptoms Reported: Sadness - if selected complete PHQ 2-9, Anxiety - if selected complete GAD, Depression - if selected complete PHQ 2-9 Additional Psychological Details: residing with cousin in home of cousin Behavioral Management Strategies: Coping strategies Behavioral Health Comment: depression Major Change/Loss/Stressor/Fears (CP): Medical condition, self Techniques to Cope with Loss/Stress/Change: Counseling, Medication Quality of Family Relationships: supportive Financial stressors; SOB; recent COVID diagnosis    03/20/2024    PHQ2-9 Depression Screening   Little interest or pleasure in doing things Several days  Feeling down, depressed, or hopeless Several days  PHQ-2 - Total Score 2  Trouble falling or staying asleep, or sleeping too much More than half the days  Feeling tired or having little energy Several days  Poor appetite or overeating  Several days  Feeling bad about yourself - or that you are a failure or have let yourself or your family down Several days  Trouble concentrating on things, such as reading the newspaper or watching television Several days  Moving or speaking so slowly that other people could have noticed.  Or the opposite - being so fidgety or restless that you have been moving around a lot more than usual Several days  Thoughts that you would be better off dead, or hurting yourself in some way Not at all  PHQ2-9 Total Score 9  If you checked off any problems, how difficult have these problems made it for you to do your work, take care of things at home, or get along with other people Somewhat difficult  Depression Interventions/Treatment Counseling    Vitals:   BP is being monitored through PCP office and through cardiology office Medications Reviewed Today     Reviewed by Frances Ozell GORMAN KEN (Social Worker) on  03/20/24 at [phone]  Med List Status: <None>   Medication Order Taking? Sig Documenting Provider Last Dose Status Informant  albuterol  (VENTOLIN  HFA) 108 (90 Base) MCG/ACT inhaler [id number] Yes Inhale 2 puffs  into the lungs every 6 (six) hours as needed for wheezing or shortness of breath. Shitarev, Dimitry, MD  Active   aspirin  EC 81 MG tablet [id number] Yes Take 1 tablet (81 mg total) by mouth daily. Swallow whole. Daphne, Ben [place], FNP  Active   clotrimazole -betamethasone  (LOTRISONE ) cream [id number] Yes Apply 1 Application topically 2 (two) times daily. Celestia Rosaline SQUIBB, NP  Active   cyclobenzaprine (FLEXERIL) 10 MG tablet [id number] Yes Take 10 mg by mouth 2 (two) times daily as needed. [provider]  Active Self, Pharmacy Records  digoxin  (LANOXIN ) 0.125 MG tablet [id number] Yes Take 1 tablet (0.125 mg total) by mouth daily. [place], [place], [place]  Active   empagliflozin  (JARDIANCE ) 10 MG TABS tablet [id number] Yes Take 1 tablet (10 mg total) by mouth daily. [place], [place], FNP  Active   furosemide  (LASIX ) 20 MG tablet [id number] Yes Take 1 tablet (20 mg total) by mouth once daily as needed. [place], Harlene HERO, FNP  Active   hydrOXYzine  (ATARAX ) 10 MG tablet [id number] Yes Take 1 tablet (10 mg total) by mouth 3 (three) times daily as needed. Celestia Rosaline SQUIBB, NP  Active   rosuvastatin  (CRESTOR ) 20 MG tablet [id number] Yes Take 1 tablet (20 mg total) by mouth daily. [place], [place], [place]  Active   sacubitril -valsartan  (ENTRESTO ) 24-26 MG [id number] Yes Take 1 tablet by mouth 2 (two) times daily. [place], Harlene HERO, [place]  Active   spironolactone  (ALDACTONE ) 25 MG tablet [id number] Yes Take 1 tablet (25 mg total) by mouth daily. Bensimhon, Toribio SAUNDERS, MD  Active             Recommendation:   PCP Follow-up Continue Current Plan of Care Call LCSW as needed for SW support  Take medications as prescribed Communicate with nurse at PCP office to discuss medication questions and to  discuss current medical needs  Follow Up Plan:   Telephone follow up appointment date/time:  04/29/2024 at 10:30 AM   Glendia Pear  MSW, LCSW [place]/Value Based Care [facility] Licensed Clinical Social Worker Direct Dial:  [phone] Fax:  [phone] Website:  [email or web address]

## 2024-03-20 NOTE — Patient Instructions (Signed)
 Visit Information  Thank you for taking time to visit with me today. Please don't hesitate to contact me if I can be of assistance to you before our next scheduled appointment.  Our next appointment is by telephone on 04/29/2024 at 10:30 AM   Please call the care guide team at [phone] if you need to cancel or reschedule your appointment.   Following is a copy of your care plan:   Goals Addressed             This Visit's Progress    VBCI Social Work Care Plan       Problems:   Transport challenges; uses local [employer] bus system             Financial challenges              Pain issues             Decreased family support. He does have siblings who he talks to on the phone but both siblings live out of state  CSW Clinical Goal(s):   Over the next 30 days the Patient will attend all scheduled medical appointments as evidenced by patient report and care team review of appointment completion in electronic medical record.            Over next 30 days, patient will communicate as needed with PCP office (NP Rosaline Bohr) to discuss breathing issues of client and discuss medical needs of client AEB patient report of communication with PCP office  Interventions:  Discussed client housing needs and status.  He said he has now found temporary housing. He is residing now at home of his cousin in [place], [place]              Discussed medication procurement. Client said he had questions about how to take his prescribed medications. LCSW encouraged Jesse Key to call PCP office today to talk with office nurse about his medication questions. He said he had been diagnosed with COVID recently and has been short of breath. He said he is using his inhaler a good bit and about to run out of his inhaler puffs. LCSW encouraged Jesse Key to call nurse today at PCP office to review his medication questions and to talk with nurse about his current medical symptoms faced             Discussed program support  with client             Discussed client support with NP Rosaline Bohr             Discussed client support with cardiologist. Client said he has appointment today with cardiologist             Discussed pain issues of client.  Discussed vision challenges. He said he has decreased vision in right eye             Discussed transport needs. He said he uses [employer] to help with transport as needed. Discussed client possible use of [place] [employer] for possible transport help for client to and from medical appointments              Discussed energy level. He said he sometimes is fatigued. He sometimes gets short of breath                Provided counseling support for client.                Discussed client mood. He sometimes  gets sad per client.  Completed PHQ 2/9; Completed GAD-7.  Client feels his mood has improved some recently due to his finding adequate housing (with his cousin)              Encouraged client to call LCSW as needed for SW support at [phone].               Client was appreciative of call from LCSW today  Patient Goals/Self-Care Activities:  Attend scheduled appointments with NP and with cardiologist              Take medications as prescribed             Work job as scheduled at [employer].              Stay in communication with his siblings             Continue current care plan             Call LCSW as needed for SW support  Plan:   Telephone follow up appointment with care management team member scheduled for:  04/29/2024 at 10:30 AM        Please go to [facility]gent Care [address] [phone]) if you are experiencing a Mental Health or Behavioral Health Crisis or need someone to talk to.  The patient verbalized understanding of instructions, educational materials, and care plan provided today and DECLINED offer to receive copy of patient instructions, educational materials, and care plan.     Glendia Pear  MSW, LCSW [place] Licensed Clinical Social Worker Direct Dial:  [phone] Fax:  [phone] Website:  [email or web address]

## 2024-03-22 ENCOUNTER — Other Ambulatory Visit (HOSPITAL_COMMUNITY): Payer: Self-pay

## 2024-03-29 ENCOUNTER — Telehealth (HOSPITAL_COMMUNITY): Payer: Self-pay

## 2024-03-29 NOTE — Telephone Encounter (Signed)
 Called to confirm/remind patient of their appointment at the Advanced Heart Failure Clinic on 04/01/24.   Appointment:   [x] Confirmed  [] Left mess   [] No answer/No voice mail  [] VM Full/unable to leave message  [] Phone not in service  Patient reminded to bring all medications and/or complete list.  Confirmed patient has transportation. Gave directions, instructed to utilize valet parking.

## 2024-04-01 ENCOUNTER — Emergency Department (HOSPITAL_COMMUNITY)

## 2024-04-01 ENCOUNTER — Telehealth (HOSPITAL_COMMUNITY): Payer: Self-pay

## 2024-04-01 ENCOUNTER — Other Ambulatory Visit: Payer: Self-pay

## 2024-04-01 ENCOUNTER — Encounter (HOSPITAL_COMMUNITY): Payer: Self-pay

## 2024-04-01 ENCOUNTER — Telehealth (HOSPITAL_COMMUNITY): Payer: Self-pay | Admitting: Licensed Clinical Social Worker

## 2024-04-01 ENCOUNTER — Ambulatory Visit (HOSPITAL_COMMUNITY): Admission: RE | Admit: 2024-04-01 | Source: Ambulatory Visit

## 2024-04-01 ENCOUNTER — Inpatient Hospital Stay (HOSPITAL_COMMUNITY)
Admission: EM | Admit: 2024-04-01 | Discharge: 2024-04-04 | DRG: 291 | Disposition: A | Discharge status: Home / Self Care | Source: Ambulatory Visit | Attending: Internal Medicine | Admitting: Internal Medicine

## 2024-04-01 DIAGNOSIS — R918 Other nonspecific abnormal finding of lung field: Secondary | ICD-10-CM | POA: Diagnosis not present

## 2024-04-01 DIAGNOSIS — Y92239 Unspecified place in hospital as the place of occurrence of the external cause: Secondary | ICD-10-CM | POA: Diagnosis not present

## 2024-04-01 DIAGNOSIS — Z556 Problems related to health literacy: Secondary | ICD-10-CM

## 2024-04-01 DIAGNOSIS — Z7982 Long term (current) use of aspirin: Secondary | ICD-10-CM

## 2024-04-01 DIAGNOSIS — J45909 Unspecified asthma, uncomplicated: Secondary | ICD-10-CM | POA: Diagnosis present

## 2024-04-01 DIAGNOSIS — I509 Heart failure, unspecified: Principal | ICD-10-CM

## 2024-04-01 DIAGNOSIS — Z91013 Allergy to seafood: Secondary | ICD-10-CM

## 2024-04-01 DIAGNOSIS — Z8616 Personal history of COVID-19: Secondary | ICD-10-CM

## 2024-04-01 DIAGNOSIS — N1831 Chronic kidney disease, stage 3a: Secondary | ICD-10-CM | POA: Diagnosis present

## 2024-04-01 DIAGNOSIS — N1832 Chronic kidney disease, stage 3b: Secondary | ICD-10-CM | POA: Diagnosis present

## 2024-04-01 DIAGNOSIS — R252 Cramp and spasm: Secondary | ICD-10-CM | POA: Diagnosis not present

## 2024-04-01 DIAGNOSIS — R791 Abnormal coagulation profile: Secondary | ICD-10-CM | POA: Diagnosis not present

## 2024-04-01 DIAGNOSIS — G479 Sleep disorder, unspecified: Secondary | ICD-10-CM | POA: Diagnosis present

## 2024-04-01 DIAGNOSIS — I130 Hypertensive heart and chronic kidney disease with heart failure and stage 1 through stage 4 chronic kidney disease, or unspecified chronic kidney disease: Principal | ICD-10-CM | POA: Diagnosis present

## 2024-04-01 DIAGNOSIS — N179 Acute kidney failure, unspecified: Secondary | ICD-10-CM | POA: Diagnosis present

## 2024-04-01 DIAGNOSIS — T502X5A Adverse effect of carbonic-anhydrase inhibitors, benzothiadiazides and other diuretics, initial encounter: Secondary | ICD-10-CM | POA: Diagnosis not present

## 2024-04-01 DIAGNOSIS — I2510 Atherosclerotic heart disease of native coronary artery without angina pectoris: Secondary | ICD-10-CM

## 2024-04-01 DIAGNOSIS — M109 Gout, unspecified: Secondary | ICD-10-CM

## 2024-04-01 DIAGNOSIS — F17210 Nicotine dependence, cigarettes, uncomplicated: Secondary | ICD-10-CM | POA: Diagnosis present

## 2024-04-01 DIAGNOSIS — M79675 Pain in left toe(s): Secondary | ICD-10-CM | POA: Diagnosis not present

## 2024-04-01 DIAGNOSIS — Z833 Family history of diabetes mellitus: Secondary | ICD-10-CM

## 2024-04-01 DIAGNOSIS — J9811 Atelectasis: Secondary | ICD-10-CM | POA: Diagnosis present

## 2024-04-01 DIAGNOSIS — I5023 Acute on chronic systolic (congestive) heart failure: Secondary | ICD-10-CM | POA: Diagnosis present

## 2024-04-01 DIAGNOSIS — Z7984 Long term (current) use of oral hypoglycemic drugs: Secondary | ICD-10-CM

## 2024-04-01 DIAGNOSIS — I081 Rheumatic disorders of both mitral and tricuspid valves: Secondary | ICD-10-CM | POA: Diagnosis present

## 2024-04-01 DIAGNOSIS — Z91148 Patient's other noncompliance with medication regimen for other reason: Secondary | ICD-10-CM

## 2024-04-01 DIAGNOSIS — I447 Left bundle-branch block, unspecified: Secondary | ICD-10-CM | POA: Diagnosis present

## 2024-04-01 DIAGNOSIS — Z79899 Other long term (current) drug therapy: Secondary | ICD-10-CM

## 2024-04-01 DIAGNOSIS — E875 Hyperkalemia: Secondary | ICD-10-CM | POA: Diagnosis present

## 2024-04-01 DIAGNOSIS — I517 Cardiomegaly: Secondary | ICD-10-CM | POA: Diagnosis not present

## 2024-04-01 DIAGNOSIS — E785 Hyperlipidemia, unspecified: Secondary | ICD-10-CM | POA: Diagnosis present

## 2024-04-01 DIAGNOSIS — R0602 Shortness of breath: Secondary | ICD-10-CM | POA: Diagnosis not present

## 2024-04-01 DIAGNOSIS — R079 Chest pain, unspecified: Secondary | ICD-10-CM

## 2024-04-01 DIAGNOSIS — Z8249 Family history of ischemic heart disease and other diseases of the circulatory system: Secondary | ICD-10-CM

## 2024-04-01 DIAGNOSIS — I2781 Cor pulmonale (chronic): Secondary | ICD-10-CM | POA: Diagnosis present

## 2024-04-01 DIAGNOSIS — I428 Other cardiomyopathies: Secondary | ICD-10-CM | POA: Diagnosis present

## 2024-04-01 LAB — CBC WITH DIFFERENTIAL/PLATELET
Abs Immature Granulocytes: 0.01 K/uL (ref 0.00–0.07)
Basophils Absolute: 0.1 K/uL (ref 0.0–0.1)
Basophils Relative: 1 %
Eosinophils Absolute: 0.1 K/uL (ref 0.0–0.5)
Eosinophils Relative: 1 %
HCT: 46.8 % (ref 39.0–52.0)
Hemoglobin: 14.5 g/dL (ref 13.0–17.0)
Immature Granulocytes: 0 %
Lymphocytes Relative: 27 %
Lymphs Abs: 2.6 K/uL (ref 0.7–4.0)
MCH: 23.4 pg — ABNORMAL LOW (ref 26.0–34.0)
MCHC: 31 g/dL (ref 30.0–36.0)
MCV: 75.6 fL — ABNORMAL LOW (ref 80.0–100.0)
Monocytes Absolute: 0.7 K/uL (ref 0.1–1.0)
Monocytes Relative: 7 %
Neutro Abs: 6.3 K/uL (ref 1.7–7.7)
Neutrophils Relative %: 64 %
Platelets: 280 K/uL (ref 150–400)
RBC: 6.19 MIL/uL — ABNORMAL HIGH (ref 4.22–5.81)
RDW: 15.6 % — ABNORMAL HIGH (ref 11.5–15.5)
WBC: 9.7 K/uL (ref 4.0–10.5)
nRBC: 0 % (ref 0.0–0.2)

## 2024-04-01 LAB — COMPREHENSIVE METABOLIC PANEL WITH GFR
ALT: 21 U/L (ref 0–44)
AST: 27 U/L (ref 15–41)
Albumin: 3.3 g/dL — ABNORMAL LOW (ref 3.5–5.0)
Alkaline Phosphatase: 74 U/L (ref 38–126)
Anion gap: 10 (ref 5–15)
BUN: 14 mg/dL (ref 6–20)
CO2: 20 mmol/L — ABNORMAL LOW (ref 22–32)
Calcium: 8.5 mg/dL — ABNORMAL LOW (ref 8.9–10.3)
Chloride: 105 mmol/L (ref 98–111)
Creatinine, Ser: 1.58 mg/dL — ABNORMAL HIGH (ref 0.61–1.24)
GFR, Estimated: 50 mL/min — ABNORMAL LOW (ref >60)
Glucose, Bld: 89 mg/dL (ref 70–99)
Potassium: 5.2 mmol/L — ABNORMAL HIGH (ref 3.5–5.1)
Sodium: 135 mmol/L (ref 135–145)
Total Bilirubin: 1.1 mg/dL (ref 0.0–1.2)
Total Protein: 6.5 g/dL (ref 6.5–8.1)

## 2024-04-01 LAB — BRAIN NATRIURETIC PEPTIDE: B Natriuretic Peptide: >4500 pg/mL — ABNORMAL HIGH (ref 0.0–100.0)

## 2024-04-01 LAB — TROPONIN I (HIGH SENSITIVITY): Troponin I (High Sensitivity): 18 ng/L — ABNORMAL HIGH (ref <18)

## 2024-04-01 LAB — D-DIMER, QUANTITATIVE: D-Dimer, Quant: 0.51 ug{FEU}/mL — ABNORMAL HIGH (ref 0.00–0.50)

## 2024-04-01 MED ORDER — IOHEXOL 350 MG/ML SOLN
75.0000 mL | Freq: Once | INTRAVENOUS | Status: AC | PRN
  Administered 2024-04-01: 75 mL via INTRAVENOUS

## 2024-04-01 MED ORDER — FUROSEMIDE 10 MG/ML IJ SOLN
40.0000 mg | Freq: Once | INTRAMUSCULAR | Status: AC
  Administered 2024-04-01: 40 mg via INTRAVENOUS
  Filled 2024-04-01: qty 4

## 2024-04-01 NOTE — ED Provider Triage Note (Signed)
 Emergency Medicine Provider Triage Evaluation Note  Jesse Key , a 58 y.o. male  was evaluated in triage.  Pt complains of shortness of breath.  History of heart failure.  States that he was going to clinic appointment today, was feeling bad, was referred to the emergency department.  Patient states that he has not felt like he has recovered since diagnosis of COVID.  Was seen in the emergency department on 03/13/2024 for this.  Does report some increasing swelling in the legs and weights.  He reports that he wakes up short of breath, has difficulty with lying flat.  Patient also reports a new living situation, a lot of difficulty sleeping due to roommates.  States that he has to be up at 4:30 in the morning for work and has not been able to sleep.  States that he wakes up feeling extremely anxious.  Review of Systems  Positive: Shortness of breath Negative: Fever  Physical Exam  BP 104/75   Pulse 94   Temp 98 F (36.7 C)   Resp 14   Ht 5' 6 (1.676 m)   Wt 63.5 kg   SpO2 100%   BMI 22.60 kg/m  Gen:   Awake, no distress   Resp:  Normal effort  MSK:   Moves extremities without difficulty  Other:  Mild lower extremity edema  Medical Decision Making  Medically screening exam initiated at 1:20 PM.  Appropriate orders placed.  Jesse Key was informed that the remainder of the evaluation will be completed by another provider, this initial triage assessment does not replace that evaluation, and the importance of remaining in the ED until their evaluation is complete.     Jesse Chew, PA-C 04/01/24 1321

## 2024-04-01 NOTE — ED Notes (Signed)
 Ambulated in the hallway with Nurse Tech, pulse ox maintained above 95%. Patient complained of shortness of breath.

## 2024-04-01 NOTE — ED Triage Notes (Signed)
 Pt had appointment at Heart Failure clinic but staff brought him over after he was not feeling good. Nurse states pt is extremely SOB on exertion, right sided chest pain, body aches. Pt had COVID 2 weeks ago but this feels different.

## 2024-04-01 NOTE — Telephone Encounter (Signed)
 Patient came to office to check in for his visit, patient is very short of breath. Reports that every 10 steps he has to stop and take a break because he is so short of breath. Reports severe right sided chest pain when he is up moving around. Reports that his shortness of breath has never been like this. Patient reports that he also had COVID last week.   Spoke with Harlene and Alma Diaz, NP's-- who state that patient should be seen in the ER for evaluation of acute symptoms.   Patient taken to the ER via Wheelchair.

## 2024-04-01 NOTE — ED Provider Notes (Signed)
 [place] EMERGENCY DEPARTMENT AT [facility] Provider Note   CSN: [id number] Arrival date & time: 04/01/24  1258     Patient presents with: No chief complaint on file.   Jesse Key is a 58 y.o. male.   58 year old male with past medical history of asthma and CHF presenting to the emergency department today with right-sided chest pain and shortness of breath.  The patient states that this been going on for the past few days.  He went to follow-up with cardiology today and was sent in for further evaluation.  Patient states that he has been having a cough that has been mostly nonproductive.  He states that he discontinued his Lasix  a few weeks ago when he had COVID because he was having a lot of dry mouth and lightheadedness at the time.  He has not restarted.  He does report some mild leg swelling with this.  Does report some orthopnea.  He reports that he is having some right sided chest pain.  Reports this is pleuritic.  He denies any hemoptysis.        Prior to Admission medications   Medication Sig Start Date End Date Taking? Authorizing Provider  albuterol  (PROVENTIL  HFA;VENTOLIN  HFA) 108 (90 Base) MCG/ACT inhaler Inhale 1-2 puffs into the lungs every 6 (six) hours as needed for wheezing or shortness of breath. 04/09/17   Olympia Gee, PA-C  albuterol  (VENTOLIN  HFA) 108 (90 Base) MCG/ACT inhaler Inhale 1-2 puffs into the lungs every 6 (six) hours as needed for wheezing or shortness of breath. 03/13/24   Smoot, Lauraine LABOR, PA-C  aspirin  EC 81 MG tablet Take 1 tablet (81 mg total) by mouth daily. Swallow whole. 03/13/24   Smoot, Lauraine LABOR, PA-C  cyclobenzaprine  (FLEXERIL ) 10 MG tablet Take 1 tablet (10 mg total) by mouth 2 (two) times daily as needed for muscle spasms. 11/17/23   Laurice Maude BROCKS, MD  digoxin  (LANOXIN ) 0.125 MG tablet Take 1 tablet (0.125 mg total) by mouth daily. 03/13/24   Smoot, Lauraine LABOR, PA-C  empagliflozin  (JARDIANCE ) 10 MG TABS tablet Take 1 tablet (10 mg  total) by mouth daily before breakfast. 03/13/24   Smoot, Lauraine LABOR, PA-C  furosemide  (LASIX ) 20 MG tablet Take 1 tablet (20 mg total) by mouth daily. 03/13/24   Smoot, Lauraine LABOR, PA-C  losartan  (COZAAR ) 25 MG tablet Take 1 tablet (25 mg total) by mouth daily. 03/13/24   Smoot, Lauraine LABOR, PA-C  metoprolol  succinate (TOPROL -XL) 25 MG 24 hr tablet Take 0.5 tablets (12.5 mg total) by mouth daily. 03/13/24   Smoot, Sarah A, PA-C  naproxen sodium (ALEVE) 220 MG tablet Take 440 mg by mouth every 4 (four) hours as needed (for pain).    [provider]  rosuvastatin  (CRESTOR ) 20 MG tablet Take 1 tablet (20 mg total) by mouth daily. 03/13/24   Smoot, Lauraine LABOR, PA-C    Allergies: Shrimp [shellfish allergy]    Review of Systems  Respiratory:  Positive for shortness of breath.   Cardiovascular:  Positive for leg swelling.  All other systems reviewed and are negative.   Updated Vital Signs BP 122/88   Pulse 91   Temp 98 F (36.7 C)   Resp 19   Ht 5' 6 (1.676 m)   Wt 63.5 kg   SpO2 99%   BMI 22.60 kg/m   Physical Exam Vitals and nursing note reviewed.   Gen: NAD Eyes: PERRL, EOMI HEENT: no oropharyngeal swelling Neck: trachea midline Resp: clear to  auscultation bilaterally, diminished at bilateral lung bases Card: RRR, no murmurs, rubs, or gallops Abd: nontender, nondistended Extremities: no calf tenderness, trace edema bilaterally Vascular: 2+ radial pulses bilaterally, 2+ DP pulses bilaterally Skin: no rashes Psyc: acting appropriately   (all labs ordered are listed, but only abnormal results are displayed) Labs Reviewed  CBC WITH DIFFERENTIAL/PLATELET - Abnormal; Notable for the following components:      Result Value   RBC 6.19 (*)    MCV 75.6 (*)    MCH 23.4 (*)    RDW 15.6 (*)    All other components within normal limits  COMPREHENSIVE METABOLIC PANEL WITH GFR - Abnormal; Notable for the following components:   Potassium 5.2 (*)    CO2 20 (*)    Creatinine, Ser 1.58 (*)     Calcium  8.5 (*)    Albumin 3.3 (*)    GFR, Estimated 50 (*)    All other components within normal limits  BRAIN NATRIURETIC PEPTIDE - Abnormal; Notable for the following components:   B Natriuretic Peptide >4,500.0 (*)    All other components within normal limits  D-DIMER, QUANTITATIVE - Abnormal; Notable for the following components:   D-Dimer, Quant 0.51 (*)    All other components within normal limits  TROPONIN I (HIGH SENSITIVITY) - Abnormal; Notable for the following components:   Troponin I (High Sensitivity) 18 (*)    All other components within normal limits    EKG: EKG Interpretation Date/Time:  Monday April 01 2024 13:09:16 EDT Ventricular Rate:  96 PR Interval:  188 QRS Duration:  152 QT Interval:  402 QTC Calculation: 507 R Axis:   126  Text Interpretation: Normal sinus rhythm Biatrial enlargement Right axis deviation Left ventricular hypertrophy with QRS widening and repolarization abnormality ( Cornell product ) Abnormal ECG No significant change since last tracing Confirmed by Emil Share [phone]) on 04/01/2024 3:44:24 PM  Radiology: CT Angio Chest PE W and/or Wo Contrast Result Date: 04/01/2024 EXAM: CTA CHEST 04/01/2024 08:06:00 PM TECHNIQUE: CTA of the chest was performed without and with the administration of 75 mL of intravenous iohexol  (OMNIPAQUE ) 350 MG/ML injection. Multiplanar reformatted images are provided for review. MIP images are provided for review. Automated exposure control, iterative reconstruction, and/or weight based adjustment of the mA/kV was utilized to reduce the radiation dose to as low as reasonably achievable. COMPARISON: Same-day chest radiograph and chest CT made on 01/26/2019. CLINICAL HISTORY: Pulmonary embolism (PE) suspected, low to intermediate prob, positive D-dimer. Shortness of breath FINDINGS: PULMONARY ARTERIES: Pulmonary arteries are adequately opacified for evaluation. No pulmonary embolism. Main pulmonary artery is normal in  caliber. MEDIASTINUM: Cardiomegaly. Coronary artery aortic atherosclerotic calcification. Reflux of contrast into the IVC and hepatic veins is compatible with elevated right heart pressures. There is no acute abnormality of the thoracic aorta. LYMPH NODES: No mediastinal, hilar or axillary lymphadenopathy. LUNGS AND PLEURA: Small bilateral pleural effusions. Diffuse bronchial wall thickening. Mild interlobular septal thickening in the lower lungs. Patchy ground-glass opacities in the posterior right upper lobe and both lower lobes. No pneumothorax. UPPER ABDOMEN: Limited images of the upper abdomen are unremarkable. SOFT TISSUES AND BONES: No acute bone or soft tissue abnormality. IMPRESSION: 1. No pulmonary embolism. 2. CHF and pulmonary edema. 3. Small bilateral pleural effusions. Electronically signed by: Norman Gatlin MD 04/01/2024 08:23 PM EDT RP Workstation: [id number]   DG Chest 2 View Result Date: 04/01/2024 CLINICAL DATA:  Shortness of breath EXAM: CHEST - 2 VIEW COMPARISON:  Chest x-ray performed March 13, 2024  FINDINGS: Heart is enlarged. Mild prominence of the central interstitium. No discrete lobar infiltrate. No pleural effusion or pneumothorax. Minimal basilar atelectasis is suspected. IMPRESSION: 1. Enlarged heart with minimal basilar atelectasis suspected. Electronically Signed   By: Maude Naegeli M.D.   On: 04/01/2024 13:59     Procedures   Medications Ordered in the ED  iohexol  (OMNIPAQUE ) 350 MG/ML injection 75 mL (75 mLs Intravenous Contrast Given 04/01/24 2005)  furosemide  (LASIX ) injection 40 mg (40 mg Intravenous Given 04/01/24 2104)                                    Medical Decision Making 58 year old male with past medical history of asthma and CHF presenting to the emergency department today with right sided chest pain and shortness of breath as well as some lower extremity swelling and orthopnea.  I will further evaluate the patient here with basic lab as well as an  EKG, chest x-ray, and troponin to eval for ACS, pulmonary edema, pulmonary infiltrates, or pneumothorax.  I will also obtain a D-dimer given the pleuritic nature of his pain to evaluate for pulmonary embolism.  The patient does have some trace edema bilaterally.  I will reevaluate for ultimate disposition.  The patient's imaging studies are consistent with CHF.  CT angiogram shows there is no pulmonary embolism.  The patient's troponin and BNP are elevated.  Suspect this is all due to CHF.  After Lasix  the patient is ambulatory and is not hypoxic but after 10 to 12 feet he is very dyspneic.  Given his symptoms a call is placed to hospital service for admission.  Amount and/or Complexity of Data Reviewed Labs: ordered. Radiology: ordered.  Risk Prescription drug management. Decision regarding hospitalization.        Final diagnoses:  Acute on chronic congestive heart failure, unspecified heart failure type [facility])    ED Discharge Orders     None          Ula Prentice SAUNDERS, MD 04/01/24 2348

## 2024-04-01 NOTE — Telephone Encounter (Signed)
 H&V Care Navigation CSW Progress Note  Clinical Social Worker received call from pt to request assistance with housing.  Pt reports he is staying with his cousin and it is not a good environment for him- wanting direction to find other affordable housing- can afford $500-700 a month.   CSW explained in that price range quickest option would be boarding home- otherwise could apply for income based housing and would have a waiting list.  Sent [employer] affordable housing list- encouraged pt to reach out with any further questions or concerns.  Andriette HILARIO Leech, LCSW Clinical Social Worker Advanced Heart Failure Clinic Desk#: [phone] Cell#: [phone]

## 2024-04-02 ENCOUNTER — Inpatient Hospital Stay (HOSPITAL_COMMUNITY)

## 2024-04-02 DIAGNOSIS — Z91148 Patient's other noncompliance with medication regimen for other reason: Secondary | ICD-10-CM | POA: Diagnosis not present

## 2024-04-02 DIAGNOSIS — I5023 Acute on chronic systolic (congestive) heart failure: Secondary | ICD-10-CM | POA: Diagnosis present

## 2024-04-02 DIAGNOSIS — N183 Chronic kidney disease, stage 3 unspecified: Secondary | ICD-10-CM | POA: Diagnosis not present

## 2024-04-02 DIAGNOSIS — N1832 Chronic kidney disease, stage 3b: Secondary | ICD-10-CM | POA: Diagnosis not present

## 2024-04-02 DIAGNOSIS — Z833 Family history of diabetes mellitus: Secondary | ICD-10-CM | POA: Diagnosis not present

## 2024-04-02 DIAGNOSIS — Z7982 Long term (current) use of aspirin: Secondary | ICD-10-CM | POA: Diagnosis not present

## 2024-04-02 DIAGNOSIS — Z556 Problems related to health literacy: Secondary | ICD-10-CM | POA: Diagnosis not present

## 2024-04-02 DIAGNOSIS — J45909 Unspecified asthma, uncomplicated: Secondary | ICD-10-CM | POA: Diagnosis not present

## 2024-04-02 DIAGNOSIS — Z8616 Personal history of COVID-19: Secondary | ICD-10-CM | POA: Diagnosis not present

## 2024-04-02 DIAGNOSIS — I5021 Acute systolic (congestive) heart failure: Secondary | ICD-10-CM | POA: Diagnosis not present

## 2024-04-02 DIAGNOSIS — I2510 Atherosclerotic heart disease of native coronary artery without angina pectoris: Secondary | ICD-10-CM | POA: Diagnosis not present

## 2024-04-02 DIAGNOSIS — E875 Hyperkalemia: Secondary | ICD-10-CM

## 2024-04-02 DIAGNOSIS — Z8249 Family history of ischemic heart disease and other diseases of the circulatory system: Secondary | ICD-10-CM | POA: Diagnosis not present

## 2024-04-02 DIAGNOSIS — R918 Other nonspecific abnormal finding of lung field: Secondary | ICD-10-CM | POA: Diagnosis not present

## 2024-04-02 DIAGNOSIS — N1831 Chronic kidney disease, stage 3a: Secondary | ICD-10-CM | POA: Diagnosis present

## 2024-04-02 DIAGNOSIS — I509 Heart failure, unspecified: Secondary | ICD-10-CM | POA: Diagnosis not present

## 2024-04-02 DIAGNOSIS — E785 Hyperlipidemia, unspecified: Secondary | ICD-10-CM | POA: Diagnosis not present

## 2024-04-02 DIAGNOSIS — Z79899 Other long term (current) drug therapy: Secondary | ICD-10-CM | POA: Diagnosis not present

## 2024-04-02 DIAGNOSIS — R079 Chest pain, unspecified: Secondary | ICD-10-CM | POA: Insufficient documentation

## 2024-04-02 DIAGNOSIS — Z7984 Long term (current) use of oral hypoglycemic drugs: Secondary | ICD-10-CM | POA: Diagnosis not present

## 2024-04-02 DIAGNOSIS — Y92239 Unspecified place in hospital as the place of occurrence of the external cause: Secondary | ICD-10-CM | POA: Diagnosis not present

## 2024-04-02 DIAGNOSIS — M109 Gout, unspecified: Secondary | ICD-10-CM | POA: Diagnosis not present

## 2024-04-02 DIAGNOSIS — I081 Rheumatic disorders of both mitral and tricuspid valves: Secondary | ICD-10-CM | POA: Diagnosis not present

## 2024-04-02 DIAGNOSIS — I517 Cardiomegaly: Secondary | ICD-10-CM | POA: Diagnosis not present

## 2024-04-02 DIAGNOSIS — I2781 Cor pulmonale (chronic): Secondary | ICD-10-CM | POA: Diagnosis not present

## 2024-04-02 DIAGNOSIS — I130 Hypertensive heart and chronic kidney disease with heart failure and stage 1 through stage 4 chronic kidney disease, or unspecified chronic kidney disease: Secondary | ICD-10-CM | POA: Diagnosis not present

## 2024-04-02 DIAGNOSIS — I447 Left bundle-branch block, unspecified: Secondary | ICD-10-CM | POA: Diagnosis not present

## 2024-04-02 DIAGNOSIS — J9811 Atelectasis: Secondary | ICD-10-CM | POA: Diagnosis not present

## 2024-04-02 DIAGNOSIS — I10 Essential (primary) hypertension: Secondary | ICD-10-CM

## 2024-04-02 DIAGNOSIS — F17210 Nicotine dependence, cigarettes, uncomplicated: Secondary | ICD-10-CM | POA: Diagnosis not present

## 2024-04-02 DIAGNOSIS — R0602 Shortness of breath: Secondary | ICD-10-CM | POA: Diagnosis not present

## 2024-04-02 DIAGNOSIS — Z91013 Allergy to seafood: Secondary | ICD-10-CM | POA: Diagnosis not present

## 2024-04-02 DIAGNOSIS — N179 Acute kidney failure, unspecified: Secondary | ICD-10-CM | POA: Diagnosis not present

## 2024-04-02 DIAGNOSIS — I428 Other cardiomyopathies: Secondary | ICD-10-CM | POA: Diagnosis not present

## 2024-04-02 DIAGNOSIS — I110 Hypertensive heart disease with heart failure: Secondary | ICD-10-CM | POA: Diagnosis not present

## 2024-04-02 LAB — ECHOCARDIOGRAM COMPLETE
Height: 62 in
MV M vel: 4.67 m/s
MV Peak grad: 87.2 mmHg
MV VTI: 1.3 cm2
S' Lateral: 5.57 cm
Weight: 2123.47 [oz_av]

## 2024-04-02 LAB — TROPONIN I (HIGH SENSITIVITY): Troponin I (High Sensitivity): 24 ng/L — ABNORMAL HIGH (ref <18)

## 2024-04-02 LAB — BASIC METABOLIC PANEL WITH GFR
Anion gap: 11 (ref 5–15)
BUN: 15 mg/dL (ref 6–20)
CO2: 25 mmol/L (ref 22–32)
Calcium: 8.5 mg/dL — ABNORMAL LOW (ref 8.9–10.3)
Chloride: 103 mmol/L (ref 98–111)
Creatinine, Ser: 1.55 mg/dL — ABNORMAL HIGH (ref 0.61–1.24)
GFR, Estimated: 51 mL/min — ABNORMAL LOW (ref >60)
Glucose, Bld: 120 mg/dL — ABNORMAL HIGH (ref 70–99)
Potassium: 4.4 mmol/L (ref 3.5–5.1)
Sodium: 139 mmol/L (ref 135–145)

## 2024-04-02 MED ORDER — ENOXAPARIN SODIUM 40 MG/0.4ML IJ SOSY
40.0000 mg | PREFILLED_SYRINGE | INTRAMUSCULAR | Status: DC
  Administered 2024-04-02 – 2024-04-03 (×2): 40 mg via SUBCUTANEOUS
  Filled 2024-04-02 (×2): qty 0.4

## 2024-04-02 MED ORDER — METHOCARBAMOL 500 MG PO TABS
750.0000 mg | ORAL_TABLET | Freq: Three times a day (TID) | ORAL | Status: DC | PRN
  Administered 2024-04-02: 750 mg via ORAL
  Filled 2024-04-02: qty 2

## 2024-04-02 MED ORDER — METOPROLOL SUCCINATE ER 25 MG PO TB24
12.5000 mg | ORAL_TABLET | Freq: Every day | ORAL | Status: DC
  Administered 2024-04-02 – 2024-04-04 (×3): 12.5 mg via ORAL
  Filled 2024-04-02 (×3): qty 1

## 2024-04-02 MED ORDER — FUROSEMIDE 10 MG/ML IJ SOLN
40.0000 mg | Freq: Two times a day (BID) | INTRAMUSCULAR | Status: DC
  Administered 2024-04-02 (×2): 40 mg via INTRAVENOUS
  Filled 2024-04-02 (×3): qty 4

## 2024-04-02 MED ORDER — ROSUVASTATIN CALCIUM 20 MG PO TABS
20.0000 mg | ORAL_TABLET | Freq: Every day | ORAL | Status: DC
  Administered 2024-04-02 – 2024-04-04 (×3): 20 mg via ORAL
  Filled 2024-04-02 (×3): qty 1

## 2024-04-02 MED ORDER — ALBUTEROL SULFATE (2.5 MG/3ML) 0.083% IN NEBU: 2.5000 mg | INHALATION_SOLUTION | Freq: Four times a day (QID) | RESPIRATORY_TRACT | Status: DC | PRN

## 2024-04-02 MED ORDER — ACETAMINOPHEN 500 MG PO TABS: 1000.0000 mg | ORAL_TABLET | Freq: Four times a day (QID) | ORAL | Status: DC | PRN

## 2024-04-02 MED ORDER — DIGOXIN 125 MCG PO TABS
0.1250 mg | ORAL_TABLET | Freq: Every day | ORAL | Status: DC
  Administered 2024-04-02 – 2024-04-04 (×3): 0.125 mg via ORAL
  Filled 2024-04-02 (×3): qty 1

## 2024-04-02 MED ORDER — ORAL CARE MOUTH RINSE: 15.0000 mL | OROMUCOSAL | Status: DC | PRN

## 2024-04-02 MED ORDER — ASPIRIN 81 MG PO TBEC
81.0000 mg | DELAYED_RELEASE_TABLET | Freq: Every day | ORAL | Status: DC
  Administered 2024-04-02 – 2024-04-04 (×3): 81 mg via ORAL
  Filled 2024-04-02 (×3): qty 1

## 2024-04-02 MED ORDER — MELATONIN 5 MG PO TABS: 10.0000 mg | ORAL_TABLET | Freq: Every evening | ORAL | Status: DC | PRN

## 2024-04-02 MED ORDER — POTASSIUM CHLORIDE CRYS ER 20 MEQ PO TBCR
30.0000 meq | EXTENDED_RELEASE_TABLET | Freq: Once | ORAL | Status: AC
  Administered 2024-04-02: 30 meq via ORAL
  Filled 2024-04-02: qty 1

## 2024-04-02 MED ORDER — ACETAMINOPHEN 325 MG PO TABS: 650.0000 mg | ORAL_TABLET | Freq: Four times a day (QID) | ORAL | Status: DC | PRN

## 2024-04-02 MED ORDER — EMPAGLIFLOZIN 10 MG PO TABS
10.0000 mg | ORAL_TABLET | Freq: Every day | ORAL | Status: DC
  Administered 2024-04-02 – 2024-04-04 (×3): 10 mg via ORAL
  Filled 2024-04-02 (×4): qty 1

## 2024-04-02 MED ORDER — SPIRONOLACTONE 12.5 MG HALF TABLET
12.5000 mg | ORAL_TABLET | Freq: Every day | ORAL | Status: DC
  Administered 2024-04-02 – 2024-04-04 (×3): 12.5 mg via ORAL
  Filled 2024-04-02 (×3): qty 1

## 2024-04-02 MED ORDER — ACETAMINOPHEN 650 MG RE SUPP: 650.0000 mg | Freq: Four times a day (QID) | RECTAL | Status: DC | PRN

## 2024-04-02 NOTE — Progress Notes (Signed)
Heart Failure Navigator Progress Note  Assessed for Heart & Vascular TOC clinic readiness.  Patient does not meet criteria due to Advanced Heart Failure team consulted. .   Navigator will sign off at this time.   Rhae Hammock, BSN, Scientist, clinical (histocompatibility and immunogenetics) Only

## 2024-04-02 NOTE — TOC Initial Note (Signed)
 Transition of Care [facility]) - Initial/Assessment Note    Patient Details  Name: Jesse Key MRN: [id number] Date of Birth: [birth year]  Transition of Care [facility]) CM/SW Contact:    Justina Delcia Czar, RN Phone Number: [phone] 04/02/2024, 7:26 PM  Clinical Narrative:                 Spoke to pt and states he currently lives with cousin but has considered going back to the hotel where he was able to cover the $900 with his current salary. States family stays up and he has to go to work at 4 am. He desires for permanent housing. Gave permission to send referral out to [facility] for housing. Explained he will need to sign consent on his phone when the send. Pt states he works full-time at [employer] and does not qualify for [employer]. States he goes to [employer] to assist with food.  Will provide pt with a scale for daily weights. Discussed Living Better with HF booklet on low sodium/heart healthy. States it is difficult at times because he eats at work.  Provided pt with resources for pantries. Provided pt with contact number for [facility] transportation to appts.   Expected Discharge Plan: Home/Self Care Barriers to Discharge: Continued Medical Work up   Patient Goals and CMS Choice Patient states their goals for this hospitalization and ongoing recovery are:: wants to recover          Expected Discharge Plan and Services   Discharge Planning Services: CM Consult   Living arrangements for the past 2 months: Apartment                     Prior Living Arrangements/Services Living arrangements for the past 2 months: Apartment Lives with:: Relatives Patient language and need for interpreter reviewed:: Yes        Need for Family Participation in Patient Care: No (Comment) Care giver support system in place?: No (comment)   Criminal Activity/Legal Involvement Pertinent to Current Situation/Hospitalization: No - Comment as needed  Activities of Daily  Living      Permission Sought/Granted Permission sought to share information with : Case Manager, Family Supports, PCP Permission granted to share information with : Yes, Verbal Permission Granted  Share Information with NAME: Denard Brew  Permission granted to share info w AGENCY: PCP  Permission granted to share info w Relationship: brother  Permission granted to share info w Contact Information: [phone]  Emotional Assessment Appearance:: Appears stated age Attitude/Demeanor/Rapport: Engaged Affect (typically observed): Accepting Orientation: : Oriented to Self, Oriented to Place, Oriented to  Time, Oriented to Situation   Psych Involvement: No (comment)  Admission diagnosis:  CHF exacerbation (HCC) [I50.9] Acute on chronic systolic CHF (congestive heart failure) (HCC) [I50.23] Acute on chronic congestive heart failure, unspecified heart failure type (HCC) [I50.9] Patient Active Problem List   Diagnosis Date Noted   Chest pain 04/02/2024   CKD stage 3b, GFR 30-44 ml/min (HCC) - baseline scr 1.3-1.5 04/02/2024   Hyperkalemia 04/02/2024   CAD (coronary artery disease) 04/02/2024   Acute on chronic systolic CHF (congestive heart failure) (HCC) 04/02/2024   PCP:  Patient, No Pcp Per Pharmacy:   [facility] [address] [place] [address] Phone: [phone] Fax: [phone]     Social Drivers of Health (SDOH) Social History: SDOH Screenings   Food Insecurity: Food Insecurity Present (04/02/2024)  Housing: High Risk (04/02/2024)  Transportation Needs: Unmet Transportation Needs (04/02/2024)  Utilities: At Risk (04/02/2024)  Tobacco Use: High Risk (04/01/2024)   SDOH Interventions:     Readmission Risk Interventions     No data to display

## 2024-04-02 NOTE — Assessment & Plan Note (Deleted)
 04/02/24 CHF/cards has seen patient. They are managing his GDMT. Echo pending. Weight is down 7-8 lbs since admission. *update. Echo LVEF 15-20%. Cards to f/u tomorrow and comment on LVEF and what are next steps.

## 2024-04-02 NOTE — Subjective & Objective (Signed)
 Pt seen and examined. On RA. HOB @ 25 degrees and pt is not SOB. Getting his echo. Cards consulted. Hx of systolic CHF with EF of 20%. Has been off meds for about 2 weeks. Pt states he had covid 2 weeks ago and stopped taking his CHF meds due to N/V.  Urine output 2.5 liters recorded. Pt states he is feeling much better. Wants to go home soon. LE edema has gone. No further chest pain.

## 2024-04-02 NOTE — Hospital Course (Addendum)
 Jesse Key was admitted to the hospital with the working diagnosis of heart failure exacerbation.   Jesse Key is a 58 y.o. male with medical history significant of HFrEF (EF <20%), nonobstructive CAD, hypertension, hyperlipidemia, asthma, tobacco abuse presenting with a chief complaint of shortness of breath.  Patient states he had COVID 2 weeks ago and felt poorly so he stopped taking all of his cardiac medications.   His mouth was dry so he was constantly drinking water.   Then a week ago he started having shortness of breath, cough, orthopnea, and paroxysmal nocturnal dyspnea.  Dyspnea is worse with exertion.  H e was constantly using albuterol  inhaler at home with no improvement of his symptoms.   Patient states he has now started taking his home cardiac medications again including Lasix  20 mg daily again.   Also for the past few days he is having right-sided sharp chest pain.  Denies fevers.  No other complaints.   On his initial physical examination his vital signs were stable on arrival.  Labs notable for potassium 5.2, bicarb 20, creatinine 1.58, BNP >4500, troponin 18, D-dimer 0.51.  EKG without acute changes.  CT angiogram chest negative for PE but showing pulmonary edema and small bilateral pleural effusions. Patient was given IV Lasix  40 mg.  Not hypoxic but dyspneic with ambulation.

## 2024-04-02 NOTE — H&P (Signed)
 History and Physical    Jesse Key:[id number] DOB: 06/17/1966 DOA: 04/01/2024  PCP: Patient, No Pcp Per  Patient has a duplicate chart under MRN [id number].  Patient coming from: Home  Chief Complaint: Shortness of breath  HPI: Jesse Key is a 58 y.o. male with medical history significant of HFrEF (EF <20%), nonobstructive CAD, hypertension, hyperlipidemia, asthma, tobacco abuse presenting with a chief complaint of shortness of breath.  Patient states he had COVID 2 weeks ago and felt poorly so he stopped taking all of his cardiac medications.  His mouth was dry so he was constantly drinking water.  Then a week ago he started having shortness of breath, cough, orthopnea, and paroxysmal nocturnal dyspnea.  Dyspnea is worse with exertion.  He was constantly using albuterol  inhaler at home with no improvement of his symptoms.  Patient states he has now started taking his home cardiac medications again including Lasix  20 mg daily again.  Also for the past few days he is having right-sided sharp chest pain.  Denies fevers.  No other complaints.  ED Course: Vital signs stable on arrival.  Labs notable for potassium 5.2, bicarb 20, creatinine 1.58, BNP >4500, troponin 18, D-dimer 0.51.  EKG without acute changes.  CT angiogram chest negative for PE but showing pulmonary edema and small bilateral pleural effusions. Patient was given IV Lasix  40 mg.  Not hypoxic but dyspneic with ambulation.  TRH called to admit.  Review of Systems:  Review of Systems  All other systems reviewed and are negative.   Past Medical History:  Diagnosis Date   Asthma    Shellfish allergy     History reviewed. No pertinent surgical history.   reports that he has been smoking cigarettes. He has never used smokeless tobacco. He reports current alcohol use. He reports that he does not use drugs.  Allergies  Allergen Reactions   Shrimp [Shellfish Allergy] Anaphylaxis and Itching    ALL SHELLFISH     Family History  Problem Relation Age of Onset   Diabetes Mother    Hypertension Mother    Diabetes Father    Hypertension Father     Prior to Admission medications   Medication Sig Start Date End Date Taking? Authorizing Provider  albuterol  (VENTOLIN  HFA) 108 (90 Base) MCG/ACT inhaler Inhale 1-2 puffs into the lungs every 6 (six) hours as needed for wheezing or shortness of breath. 03/13/24  Yes Smoot, Sarah A, PA-C  aspirin  EC 81 MG tablet Take 1 tablet (81 mg total) by mouth daily. Swallow whole. 03/13/24  Yes Smoot, Lauraine LABOR, PA-C  digoxin  (LANOXIN ) 0.125 MG tablet Take 1 tablet (0.125 mg total) by mouth daily. 03/13/24  Yes Smoot, Lauraine LABOR, PA-C  empagliflozin  (JARDIANCE ) 10 MG TABS tablet Take 1 tablet (10 mg total) by mouth daily before breakfast. 03/13/24  Yes Smoot, Sarah A, PA-C  furosemide  (LASIX ) 20 MG tablet Take 1 tablet (20 mg total) by mouth daily. 03/13/24  Yes Smoot, Sarah A, PA-C  losartan  (COZAAR ) 25 MG tablet Take 1 tablet (25 mg total) by mouth daily. 03/13/24  Yes Smoot, Lauraine LABOR, PA-C  metoprolol  succinate (TOPROL -XL) 25 MG 24 hr tablet Take 0.5 tablets (12.5 mg total) by mouth daily. 03/13/24  Yes Smoot, Lauraine LABOR, PA-C  rosuvastatin  (CRESTOR ) 20 MG tablet Take 1 tablet (20 mg total) by mouth daily. 03/13/24  Yes SmootLauraine LABOR, PA-C    Physical Exam: Vitals:   04/01/24 1933 04/02/24 0209 04/02/24 0230 04/02/24 0341  BP:  109/74 110/75 110/75  Pulse:  91 91 91  Resp:  14  16  Temp: 98 F (36.7 C)   98 F (36.7 C)  TempSrc:    Oral  SpO2:  97% 96% 96%  Weight:      Height:        Physical Exam Vitals reviewed.  Constitutional:      General: He is not in acute distress. HENT:     Head: Normocephalic and atraumatic.  Eyes:     Extraocular Movements: Extraocular movements intact.  Cardiovascular:     Rate and Rhythm: Normal rate and regular rhythm.     Pulses: Normal pulses.  Pulmonary:     Effort: Pulmonary effort is normal. No respiratory distress.      Breath sounds: Rales present.  Abdominal:     General: Bowel sounds are normal.     Palpations: Abdomen is soft.     Tenderness: There is no abdominal tenderness. There is no guarding.  Musculoskeletal:     Cervical back: Normal range of motion.     Right lower leg: No edema.     Left lower leg: No edema.  Skin:    General: Skin is warm and dry.  Neurological:     General: No focal deficit present.     Mental Status: He is alert and oriented to person, place, and time.     Labs on Admission: I have personally reviewed following labs and imaging studies  CBC: Recent Labs  Lab 04/01/24 1327  WBC 9.7  NEUTROABS 6.3  HGB 14.5  HCT 46.8  MCV 75.6*  PLT 280   Basic Metabolic Panel: Recent Labs  Lab 04/01/24 1327  NA 135  K 5.2*  CL 105  CO2 20*  GLUCOSE 89  BUN 14  CREATININE 1.58*  CALCIUM  8.5*   GFR: Estimated Creatinine Clearance: 44.7 mL/min (A) (by C-G formula based on SCr of 1.58 mg/dL (H)). Liver Function Tests: Recent Labs  Lab 04/01/24 1327  AST 27  ALT 21  ALKPHOS 74  BILITOT 1.1  PROT 6.5  ALBUMIN 3.3*   No results for input(s): LIPASE, AMYLASE in the last 168 hours. No results for input(s): AMMONIA in the last 168 hours. Coagulation Profile: No results for input(s): INR, PROTIME in the last 168 hours. Cardiac Enzymes: No results for input(s): CKTOTAL, CKMB, CKMBINDEX, TROPONINI in the last 168 hours. BNP (last 3 results) No results for input(s): PROBNP in the last 8760 hours. HbA1C: No results for input(s): HGBA1C in the last 72 hours. CBG: No results for input(s): GLUCAP in the last 168 hours. Lipid Profile: No results for input(s): CHOL, HDL, LDLCALC, TRIG, CHOLHDL, LDLDIRECT in the last 72 hours. Thyroid Function Tests: No results for input(s): TSH, T4TOTAL, FREET4, T3FREE, THYROIDAB in the last 72 hours. Anemia Panel: No results for input(s): VITAMINB12, FOLATE, FERRITIN, TIBC,  IRON , RETICCTPCT in the last 72 hours. Urine analysis:    Component Value Date/Time   COLORURINE YELLOW 04/09/2017 1453   APPEARANCEUR CLEAR 04/09/2017 1453   LABSPEC 1.023 04/09/2017 1453   PHURINE 5.0 04/09/2017 1453   GLUCOSEU NEGATIVE 04/09/2017 1453   HGBUR NEGATIVE 04/09/2017 1453   BILIRUBINUR NEGATIVE 04/09/2017 1453   KETONESUR NEGATIVE 04/09/2017 1453   PROTEINUR NEGATIVE 04/09/2017 1453   NITRITE NEGATIVE 04/09/2017 1453   LEUKOCYTESUR NEGATIVE 04/09/2017 1453    Radiological Exams on Admission: CT Angio Chest PE W and/or Wo Contrast Result Date: 04/01/2024 EXAM: CTA CHEST 04/01/2024 08:06:00 PM TECHNIQUE: CTA of  the chest was performed without and with the administration of 75 mL of intravenous iohexol  (OMNIPAQUE ) 350 MG/ML injection. Multiplanar reformatted images are provided for review. MIP images are provided for review. Automated exposure control, iterative reconstruction, and/or weight based adjustment of the mA/kV was utilized to reduce the radiation dose to as low as reasonably achievable. COMPARISON: Same-day chest radiograph and chest CT made on 01/26/2019. CLINICAL HISTORY: Pulmonary embolism (PE) suspected, low to intermediate prob, positive D-dimer. Shortness of breath FINDINGS: PULMONARY ARTERIES: Pulmonary arteries are adequately opacified for evaluation. No pulmonary embolism. Main pulmonary artery is normal in caliber. MEDIASTINUM: Cardiomegaly. Coronary artery aortic atherosclerotic calcification. Reflux of contrast into the IVC and hepatic veins is compatible with elevated right heart pressures. There is no acute abnormality of the thoracic aorta. LYMPH NODES: No mediastinal, hilar or axillary lymphadenopathy. LUNGS AND PLEURA: Small bilateral pleural effusions. Diffuse bronchial wall thickening. Mild interlobular septal thickening in the lower lungs. Patchy ground-glass opacities in the posterior right upper lobe and both lower lobes. No pneumothorax. UPPER  ABDOMEN: Limited images of the upper abdomen are unremarkable. SOFT TISSUES AND BONES: No acute bone or soft tissue abnormality. IMPRESSION: 1. No pulmonary embolism. 2. CHF and pulmonary edema. 3. Small bilateral pleural effusions. Electronically signed by: Norman Gatlin MD 04/01/2024 08:23 PM EDT RP Workstation: [id number]   DG Chest 2 View Result Date: 04/01/2024 CLINICAL DATA:  Shortness of breath EXAM: CHEST - 2 VIEW COMPARISON:  Chest x-ray performed March 13, 2024 FINDINGS: Heart is enlarged. Mild prominence of the central interstitium. No discrete lobar infiltrate. No pleural effusion or pneumothorax. Minimal basilar atelectasis is suspected. IMPRESSION: 1. Enlarged heart with minimal basilar atelectasis suspected. Electronically Signed   By: Maude Naegeli M.D.   On: 04/01/2024 13:59    EKG: Independently reviewed.  Sinus rhythm, LBBB, QTc 507.  No significant change since previous tracing.  Assessment and Plan  Acute on chronic HFrEF Patient had COVID infection 2 weeks ago and due to feeling poorly, he stopped taking all of his cardiac medications including diuretic and was drinking a lot of water.  He is supposed to be taking Lasix  20 mg daily.  Now presenting with 1 week history of shortness of breath, cough, orthopnea, and PND.  BNP >4500.  CT showing pulmonary edema and small bilateral pleural effusions.  Not hypoxic but became dyspneic with ambulation in the ED. He was diagnosed with CHF in May 2025 and echo done at that time was showing EF <20%, RV function normal, global hypokinesis, and mild to moderate mitral regurgitation.  Also underwent cardiac cath at that time which was showing diffuse nonobstructive CAD.  Patient was given IV Lasix  40 mg in the ED with 2.5 L urine output so far.  Continue diuresis with IV Lasix  40 mg twice daily.  Continue home digoxin  and metoprolol .  Holding losartan  and Jardiance  at this time due to mild hyperkalemia.  Repeat labs and echocardiogram ordered.   Monitor intake and output, daily weights.  Low-sodium diet with fluid restriction.  I have sent a message to cardiology requesting consultation in the morning.  Right-sided chest pain CTA chest negative for PE.  Troponin 18 in the setting of acutely decompensated CHF.  EKG without acute changes.  ACS less likely as patient is not endorsing left-sided or substernal chest pain or pressure.  Trend troponin.  AKI Likely cardiorenal in etiology in the setting of acutely decompensated heart failure.  Creatinine currently 1.58, stable since labs done 2 weeks ago but baseline  appears to be 1.0-1.2.  Continue IV diuresis and monitor renal function closely.  Mild hyperkalemia Receiving IV Lasix , monitor potassium level.  Hold losartan  and Jardiance  at this time.  Nonobstructive CAD Continue aspirin , metoprolol , and Crestor .  Hypertension Currently normotensive.  Continue IV Lasix  and metoprolol .  Holding losartan  due to hyperkalemia.  Hyperlipidemia Continue Crestor .  Asthma Stable, no wheezing.  Continue albuterol  PRN wheezing.  DVT prophylaxis: Lovenox  Code Status: Full Code (discussed with the patient) Consults called: Cardiology Level of care: Progressive Care Unit Admission status: It is my clinical opinion that admission to INPATIENT is reasonable and necessary because of the expectation that this patient will require hospital care that crosses at least 2 midnights to treat this condition based on the medical complexity of the problems presented.  Given the aforementioned information, the predictability of an adverse outcome is felt to be significant.  Editha Ram MD [facility]  If 7PM-7AM, please contact night-coverage [email or web address]  04/02/2024, 5:54 AM

## 2024-04-02 NOTE — Assessment & Plan Note (Deleted)
 04/02/24. Not NSTEMI. Maybe a little demand ischemia from acute CHF.

## 2024-04-02 NOTE — Assessment & Plan Note (Signed)
 No acute coronary syndrome. Continue aspirin  and statin

## 2024-04-02 NOTE — Consult Note (Addendum)
 Advanced Heart Failure Team Consult Note   Primary Physician: Patient, No Pcp Per Cardiologist:  None  Reason for Consultation: acute on chronic systolic CHF  HPI:    Jesse Key is seen today for evaluation of acute on chronic systolic CHF at the request of Dr. Laurence with [facility]. 58 y.o. AA male w/ no prior routine medical care, past medical history of tobacco and alcohol use, and chronic systolic heart failure.  Admitted 5/25 with new acute systolic heart failure. Echo showed EF < 20%, normal RV, global HK, mid-mod MR. Diuresed well with IV lasix  and he was started on GDMT. R/LHC showed non-obstructive CAD, EF 25%, well compensated filling pressures with moderate to severely reduced CO (PCW 7, CI 2.07). cMRI showed LVEF 18%, mod dilated LV with septal-lateral dyssynchrony consistent with LBBB and diffuse severe HK. Normal RV, RVEF 35%. Mild-mod MR. Concern for LBBB CM. Discharged, weight 126.5 lbs.  Patient presented to clinic yesterday for routine follow-up and was short of breath with minimal exertion. Also noted assocaited orthopnea, PND and lower extremity edema. Symptoms started after contracting COVID 2 weeks prior. During that time he had a dry mouth, stopped taking some of his meds and was drinking a lot of fluid. D/t severity of symptoms he was sent to ED for further evaluation. Labs: Cr 1.58, BUN 14, CO2 20, K 5.2, Cl 105, BNP > 4500, HS troponin 18>24. CTA negative for PE but did show evidence of pulmonary edema and small pleural effusions, reflux of contrast into IVC and hepatic veins also noted (c/w CHF).  He was started on IV lasix  and admitted for acute on chronic CHF.  Works at [employer]. Lives with his cousin. HF clinic has been providing him with bus passes for appointments.   Denies ETOH, tobacco and drug use.  Home Medications Prior to Admission medications   Medication Sig Start Date End Date Taking? Authorizing Provider  albuterol  (VENTOLIN  HFA) 108 (90 Base)  MCG/ACT inhaler Inhale 1-2 puffs into the lungs every 6 (six) hours as needed for wheezing or shortness of breath. 03/13/24  Yes Smoot, Lauraine LABOR, PA-C  aspirin  EC 81 MG tablet Take 1 tablet (81 mg total) by mouth daily. Swallow whole. 03/13/24  Yes Smoot, Lauraine LABOR, PA-C  digoxin  (LANOXIN ) 0.125 MG tablet Take 1 tablet (0.125 mg total) by mouth daily. 03/13/24  Yes Smoot, Lauraine LABOR, PA-C  empagliflozin  (JARDIANCE ) 10 MG TABS tablet Take 1 tablet (10 mg total) by mouth daily before breakfast. 03/13/24  Yes Smoot, Sarah A, PA-C  furosemide  (LASIX ) 20 MG tablet Take 1 tablet (20 mg total) by mouth daily. 03/13/24  Yes Smoot, Lauraine LABOR, PA-C  losartan  (COZAAR ) 25 MG tablet Take 1 tablet (25 mg total) by mouth daily. 03/13/24  Yes Smoot, Lauraine LABOR, PA-C  metoprolol  succinate (TOPROL -XL) 25 MG 24 hr tablet Take 0.5 tablets (12.5 mg total) by mouth daily. 03/13/24  Yes Smoot, Lauraine LABOR, PA-C  rosuvastatin  (CRESTOR ) 20 MG tablet Take 1 tablet (20 mg total) by mouth daily. 03/13/24  Yes Smoot, Lauraine LABOR RIGGERS    Past Medical History: Past Medical History:  Diagnosis Date   Asthma    Shellfish allergy     Past Surgical History: History reviewed. No pertinent surgical history.  Family History: Family History  Problem Relation Age of Onset   Diabetes Mother    Hypertension Mother    Diabetes Father    Hypertension Father     Social History: Social History  Socioeconomic History   Marital status: Single    Spouse name: Not on file   Number of children: Not on file   Years of education: Not on file   Highest education level: Not on file  Occupational History   Not on file  Tobacco Use   Smoking status: Some Days    Current packs/day: 0.25    Types: Cigarettes   Smokeless tobacco: Never  Vaping Use   Vaping status: Never Used  Substance and Sexual Activity   Alcohol use: Yes    Comment: weekends only   Drug use: No   Sexual activity: Not on file  Other Topics Concern   Not on file  Social History  Narrative   Not on file   Social Drivers of Health   Financial Resource Strain: Not on file  Food Insecurity: Not on file  Transportation Needs: Not on file  Physical Activity: Not on file  Stress: Not on file  Social Connections: Not on file    Allergies:  Allergies  Allergen Reactions   Shrimp [Shellfish Allergy] Anaphylaxis and Itching    ALL SHELLFISH    Objective:    Vital Signs:   Temp:  [98 F (36.7 C)] 98 F (36.7 C) (09/30 0955) Pulse Rate:  [89-95] 95 (09/30 1015) Resp:  [14-19] 19 (09/30 0955) BP: (94-122)/(69-88) 94/69 (09/30 1014) SpO2:  [96 %-100 %] 100 % (09/30 0955) Weight:  [63.5 kg] 63.5 kg (09/29 1313)    Weight change: Filed Weights   04/01/24 1313  Weight: 63.5 kg    Intake/Output:   Intake/Output Summary (Last 24 hours) at 04/02/2024 1113 Last data filed at 04/02/2024 0654 Gross per 24 hour  Intake --  Output 2500 ml  Net -2500 ml      Physical Exam    General:  Thin, chronically ill appearing Cor: JVP to jaw. Regular rate & rhythm. 3/6 HSM at apex Lungs: diminished in bases Abdomen: soft, nontender, + distended Extremities: no edema Neuro: alert & orientedx3. Affect pleasant  EKG    SR 96 bpm, LBBB with QRS > 150 ms  Labs   Basic Metabolic Panel: Recent Labs  Lab 04/01/24 1327 04/02/24 0849  NA 135 139  K 5.2* 4.4  CL 105 103  CO2 20* 25  GLUCOSE 89 120*  BUN 14 15  CREATININE 1.58* 1.55*  CALCIUM  8.5* 8.5*    Liver Function Tests: Recent Labs  Lab 04/01/24 1327  AST 27  ALT 21  ALKPHOS 74  BILITOT 1.1  PROT 6.5  ALBUMIN 3.3*   No results for input(s): LIPASE, AMYLASE in the last 168 hours. No results for input(s): AMMONIA in the last 168 hours.  CBC: Recent Labs  Lab 04/01/24 1327  WBC 9.7  NEUTROABS 6.3  HGB 14.5  HCT 46.8  MCV 75.6*  PLT 280    Cardiac Enzymes: No results for input(s): CKTOTAL, CKMB, CKMBINDEX, TROPONINI in the last 168 hours.  BNP: BNP (last 3  results) Recent Labs    03/13/24 0256 04/01/24 1327  BNP >4,500.0* >4,500.0*    ProBNP (last 3 results) No results for input(s): PROBNP in the last 8760 hours.   CBG: No results for input(s): GLUCAP in the last 168 hours.  Coagulation Studies: No results for input(s): LABPROT, INR in the last 72 hours.   Imaging   CT Angio Chest PE W and/or Wo Contrast Result Date: 04/01/2024 EXAM: CTA CHEST 04/01/2024 08:06:00 PM TECHNIQUE: CTA of the chest was performed without and  with the administration of 75 mL of intravenous iohexol  (OMNIPAQUE ) 350 MG/ML injection. Multiplanar reformatted images are provided for review. MIP images are provided for review. Automated exposure control, iterative reconstruction, and/or weight based adjustment of the mA/kV was utilized to reduce the radiation dose to as low as reasonably achievable. COMPARISON: Same-day chest radiograph and chest CT made on 01/26/2019. CLINICAL HISTORY: Pulmonary embolism (PE) suspected, low to intermediate prob, positive D-dimer. Shortness of breath FINDINGS: PULMONARY ARTERIES: Pulmonary arteries are adequately opacified for evaluation. No pulmonary embolism. Main pulmonary artery is normal in caliber. MEDIASTINUM: Cardiomegaly. Coronary artery aortic atherosclerotic calcification. Reflux of contrast into the IVC and hepatic veins is compatible with elevated right heart pressures. There is no acute abnormality of the thoracic aorta. LYMPH NODES: No mediastinal, hilar or axillary lymphadenopathy. LUNGS AND PLEURA: Small bilateral pleural effusions. Diffuse bronchial wall thickening. Mild interlobular septal thickening in the lower lungs. Patchy ground-glass opacities in the posterior right upper lobe and both lower lobes. No pneumothorax. UPPER ABDOMEN: Limited images of the upper abdomen are unremarkable. SOFT TISSUES AND BONES: No acute bone or soft tissue abnormality. IMPRESSION: 1. No pulmonary embolism. 2. CHF and pulmonary  edema. 3. Small bilateral pleural effusions. Electronically signed by: Norman Gatlin MD 04/01/2024 08:23 PM EDT RP Workstation: [id number]   DG Chest 2 View Result Date: 04/01/2024 CLINICAL DATA:  Shortness of breath EXAM: CHEST - 2 VIEW COMPARISON:  Chest x-ray performed March 13, 2024 FINDINGS: Heart is enlarged. Mild prominence of the central interstitium. No discrete lobar infiltrate. No pleural effusion or pneumothorax. Minimal basilar atelectasis is suspected. IMPRESSION: 1. Enlarged heart with minimal basilar atelectasis suspected. Electronically Signed   By: Maude Naegeli M.D.   On: 04/01/2024 13:59     Medications:     Current Medications:  aspirin  EC  81 mg Oral Daily   digoxin   0.125 mg Oral Daily   empagliflozin   10 mg Oral Daily   enoxaparin  (LOVENOX ) injection  40 mg Subcutaneous Q24H   furosemide   40 mg Intravenous BID   metoprolol  succinate  12.5 mg Oral Daily   rosuvastatin   20 mg Oral Daily    Infusions:     Patient Profile   58 y.o. male with history of chronic systolic CHF, NICM, LBBB, CAD, HTN, hx tobacco and ETOH abuse.   Admitted with acute on chronic systolic CHF following recent RNCPI-80 infection and nonadherence with medications.  Assessment/Plan   Chronic Systolic Heart Failure - NICM - Echo 5/25: EF < 20%, RV nl, global HK, mid-mod MR. LVOT VTI of 7.85 with SV of 25cc concerning for low output heart failure  - Cath 5/25: Non-obstructive CAD EF 25% Well compensated filling pressures with moderate to severely reduced CO (PCW 7, CI 2.07) - cMRI 5/25: LVEF 18%, mod dilated LV with septal-lateral dyssynchrony consistent with LBBB, RVEF 35%, Mild-mod MR, nonspecific LGE.  - NYHA IV on admit. Brisk diuresis with IV lasix  40 BID (> 4L UOP since arrival per patient). Will continue. - Does not appear low-output - Continue metoprolol  xl 12.5 mg daily - start jardiance  10 mg daily - start spiro 12.5 mg daily - BP too soft for ARB/ARNi - Continue  digoxin  0.125 mg daily, check dig level tomorrow am - Repeat echo - May eventually need to consider CRT-D - Does not understand his medications. Had multiple empty pill bottles  and duplicate bottles in his bag. His other chart for merge indicates that he is [facility]. Will see if he qualifies for Paramedicine.  CAD - Cath 5/25 with nonobstructive CAD - No chest pain - Continue ASA 81 daily. - Continue Crestor  20 mg daily   HTN - BP stable today - GDMT as above   ETOH/tobacco abuse - Previously drank 2-3 16 oz beers/day. Now none since discharge. Congratulated - Currently denies tobacco use   5. SDOH - lives with cousin - Meds thru [facility] pharmacy - LCSW helping with housing resources - Referral for paramedicine as above  Length of Stay: 0  FINCH, LINDSAY N, PA-C  04/02/2024, 11:13 AM    Advanced Heart Failure Team Pager [phone] (M-F; 7a - 5p)  Please contact [facility] for night-coverage after hours (4p -7a ) and weekends on [email or web address]   Patient seen with PA, I formulated the plan and agree with the above note.   Patient has history of nonischemic cardiomyopathy, initial admission in 5/25.  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  Medication compliance has not been good. He has been off his meds for about 2 wks.  He had COVID 2 wks ago and says that his medications were giving him a dry mouth so he stopped them.  He was seen in HF clinic yesterday and noted to be markedly volume overloaded, sent to ER for evaluation/admission.  He is comfortable at rest.  Creatinine 1.55 which appears to be his baseline. BNP > 4500, troponin minimally elevated with no trend.   ECG shows IVCD with QRS 152 msec.   General: NAD Neck: JVP 14-16 cm, no thyromegaly or thyroid nodule.  Lungs: Clear to auscultation bilaterally with normal respiratory effort. CV: Lateral PMI.  Heart regular S1/S2, no S3/S4, 2/6 HSM apex.  No peripheral edema.  No carotid bruit.  Normal pedal pulses.  Abdomen: Soft,  nontender, no hepatosplenomegaly, no distention.  Skin: Intact without lesions or rashes.  Neurologic: Alert and oriented x 3.  Psych: Normal affect. Extremities: No clubbing or cyanosis.  HEENT: Normal.   1. Acute on chronic systolic CHF: Nonischemic cardiomyopathy. Echo in 5/25 showed EF < 20%, RV nl, global HK, mid-mod MR. RHC/LHC in 5/25 showed non-obstructive CAD, well-compensated filling pressures with CI 2.07.  Cardiac MRI in 5/25 showed LV EF 18%, RV EF 35%, nonspecific RV insertion site LGE.  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 He is volume overloaded on exam after stopping his meds x 2 wks.  He seems warm/well-perfused. SBP 90s.  - Lasix  40 mg IV bid (had a robust response to 40 mg IV Lasix ).  - Restart digoxin  0.125 daily. - Restart Jardiance  10 daily - Restart Toprol  XL 12.5 daily - Add spironolactone  12.5 daily. - Probably will not have enough BP room for Entresto .  Will start losartan  tomorrow as long as creatinine and BP stable.  - Suspect he will eventually benefit from CRT-D.  2. CKD stage 3: Creatinine 1.55, this has been his recent baseline.  3. 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  Minimally elevated troponin with no trend, suspect demand ischemia.  - Continue ASA, statin.  4. SDOH: Poor health literacy. Not compliant with meds. Confused about meds.   - Will need paramedicine at discharge.  Ezra Shuck 04/02/2024 12:36 PM

## 2024-04-02 NOTE — Plan of Care (Signed)

## 2024-04-02 NOTE — Assessment & Plan Note (Addendum)
 Hyperkalemia   At the time of discharge his renal function had a serum cr of 1,72 with K at 4,4 and serum bicarbonate at 22 Na 138 and Mg 2.3   Resume diuretic therapy with furosemide . Continue with spironolactone  and SGLT 2 inh  Follow up renal function as outpatient in 7 days

## 2024-04-02 NOTE — Progress Notes (Signed)
  Echocardiogram 2D Echocardiogram has been performed.  Jesse Key 04/02/2024, 3:45 PM

## 2024-04-02 NOTE — Progress Notes (Addendum)
 PROGRESS NOTE    Jesse Key  FMW:[id number] DOB: Jan 09, 1966 DOA: 04/01/2024 PCP: Jesse Key  Subjective: Pt seen and examined. On RA. HOB @ 25 degrees and pt is not SOB. Getting his echo. Cards consulted. Hx of systolic CHF with EF of 20%. Has been off meds for about 2 weeks. Pt states he had covid 2 weeks ago and stopped taking his CHF meds due to N/V.  Urine output 2.5 liters recorded. Pt states he is feeling much better. Wants to go home soon. LE edema has gone. No further chest pain.   Hospital Course: CC: SOB HPI: Jesse Key is a 58 y.o. male with medical history significant of HFrEF (EF <20%), nonobstructive CAD, hypertension, hyperlipidemia, asthma, tobacco abuse presenting with a chief complaint of shortness of breath.  Patient states he had COVID 2 weeks ago and felt poorly so he stopped taking all of his cardiac medications.  His mouth was dry so he was constantly drinking water.  Then a week ago he started having shortness of breath, cough, orthopnea, and paroxysmal nocturnal dyspnea.  Dyspnea is worse with exertion.  He was constantly using albuterol  inhaler at home with no improvement of his symptoms.  Patient states he has now started taking his home cardiac medications again including Lasix  20 mg daily again.  Also for the past few days he is having right-sided sharp chest pain.  Denies fevers.  No other complaints.   ED Course: Vital signs stable on arrival.  Labs notable for potassium 5.2, bicarb 20, creatinine 1.58, BNP >4500, troponin 18, D-dimer 0.51.  EKG without acute changes.  CT angiogram chest negative for PE but showing pulmonary edema and small bilateral pleural effusions. Patient was given IV Lasix  40 mg.  Not hypoxic but dyspneic with ambulation.  TR[facility] called to admit.   Significant Events: Admitted 04/01/2024 for acute on chronic systolic CHF   Admission Labs: WBC 9.7, HgB 14.5, plt 280 BNP >4500 Na 135, K 5.2, CO2 of 20, BUN 14, Scr 1.58, glu  89 T. Prot 6.5, AST 27, ALT 21, alk phos 74, t. Bili 1.1 D. Dimer 0.51  Admission Imaging Studies: CXR Enlarged heart with minimal basilar atelectasis suspected.  CTPA No pulmonary embolism. 2. CHF and pulmonary edema. 3. Small bilateral pleural effusions.  Significant Labs:   Significant Imaging Studies: Echo LVEF 15-20%  Antibiotic Therapy: Anti-infectives (From admission, onward)    None       Procedures:   Consultants:     Assessment and Plan: * Acute on chronic systolic CHF (congestive heart failure) (HCC) 04/02/24 CHF/cards has seen patient. They are managing his GDMT. Echo pending. Weight is down 7-8 lbs since admission. *update. Echo LVEF 15-20%. Cards to f/u tomorrow and comment on LVEF and what are next steps.  Hyperkalemia 04/02/24 yesterday K was 5.2, today K 4.4, resolved.   CKD stage 3b, GFR 30-44 ml/min (HCC) - baseline scr 1.3-1.5 04/02/24 his baseline scr 1.3-1.5. today Scr 1.55. not AKI. Monitor renal function during diuresis.   Chest pain 04/02/24. Not NSTEMI. Maybe a little demand ischemia from acute CHF.  CAD (coronary artery disease) 04/02/24 stable.    DVT prophylaxis: enoxaparin  (LOVENOX ) injection 40 mg Start: 04/02/24 1800    Code Status: Full Code Family Communication: no family at bedside. He is decisional. Disposition Plan: return home Reason for continuing need for hospitalization: remains on IV lasix . Echo pending.  Objective: Vitals:   04/02/24 1100 04/02/24 1115 04/02/24 1330 04/02/24 1430  BP: 98/67  130/66 98/64  Pulse: 93 91 84 72  Resp: (!) 32 (!) 26 (!) 26 20  Temp:   97.7 F (36.5 C) 97.7 F (36.5 C)  TempSrc:    Oral  SpO2: 100% 100% 100% 96%  Weight:    60.2 kg  Height:    5' 2 (1.575 m)    Intake/Output Summary (Last 24 hours) at 04/02/2024 1718 Last data filed at 04/02/2024 0654 Gross Key 24 hour  Intake --  Output 2500 ml  Net -2500 ml   Filed Weights   04/01/24 1313 04/02/24 1430  Weight: 63.5  kg 60.2 kg   Weight Information (since admission)     Date/Time Weight Weight in lbs BSA (Calculated - sq m) BMI (Calculated) Who   04/02/24 1430 60.2 kg 132.72 lbs 1.62 sq meters 24.27 KT   04/01/24 1313 63.5 kg 140 lbs 1.72 sq meters 22.61 VV      Examination:  Physical Exam Vitals and nursing note reviewed.  Constitutional:      General: He is not in acute distress.    Appearance: He is normal weight. He is not toxic-appearing.  HENT:     Head: Normocephalic and atraumatic.  Eyes:     General: No scleral icterus. Cardiovascular:     Rate and Rhythm: Normal rate and regular rhythm.  Pulmonary:     Effort: Pulmonary effort is normal. No respiratory distress.     Comments: Lying supine with HOB elevated to 25 degrees. No SOB or dyspnea Abdominal:     General: Abdomen is flat. There is no distension.  Musculoskeletal:     Right lower leg: No edema.     Left lower leg: No edema.  Skin:    General: Skin is warm and dry.     Capillary Refill: Capillary refill takes less than 2 seconds.  Neurological:     Mental Status: He is alert and oriented to person, place, and time.     Data Reviewed: I have personally reviewed following labs and imaging studies  CBC: Recent Labs  Lab 04/01/24 1327  WBC 9.7  NEUTROABS 6.3  HGB 14.5  HCT 46.8  MCV 75.6*  PLT 280   Basic Metabolic Panel: Recent Labs  Lab 04/01/24 1327 04/02/24 0849  NA 135 139  K 5.2* 4.4  CL 105 103  CO2 20* 25  GLUCOSE 89 120*  BUN 14 15  CREATININE 1.58* 1.55*  CALCIUM  8.5* 8.5*   GFR: Estimated Creatinine Clearance: 39.1 mL/min (A) (by C-G formula based on SCr of 1.55 mg/dL (H)). Liver Function Tests: Recent Labs  Lab 04/01/24 1327  AST 27  ALT 21  ALKPHOS 74  BILITOT 1.1  PROT 6.5  ALBUMIN 3.3*   BNP (last 3 results) Recent Labs    03/13/24 0256 04/01/24 1327  BNP >4,500.0* >4,500.0*    Radiology Studies: ECHOCARDIOGRAM COMPLETE Result Date: 04/02/2024    ECHOCARDIOGRAM  REPORT   Patient Name:   Jesse Key Date of Exam: 04/02/2024 Medical Rec #:  [id number]        Height:       62.0 in Accession #:    [id number]       Weight:       132.7 lb Date of Birth:  05-29-1966         BSA:          1.606 m Patient Age:    58 years         BP:  110/75 mmHg Patient Gender: M                HR:           94 bpm. Exam Location:  Inpatient Procedure: 2D Echo, Cardiac Doppler and Color Doppler (Both Spectral and Color            Flow Doppler were utilized during procedure). Indications:    CHF- Acute Systolic I50.21  History:        Patient has no prior history of Echocardiogram examinations.                 CHF, CAD, Signs/Symptoms:Chest Pain; Risk Factors:Current                 Smoker.  Sonographer:    Koleen Popper RDCS Referring Phys: [id number] RATHORE  Sonographer Comments: Definity  contrast attempted- no IV access. IMPRESSIONS  1. Left ventricular ejection fraction, by estimation, is 15-20%. The left ventricle has severely decreased function. The left ventricle demonstrates global hypokinesis. The left ventricular internal cavity size was moderately dilated. Left ventricular diastolic parameters are indeterminate.  2. Right ventricular systolic function is mildly reduced. The right ventricular size is mildly enlarged. There is moderately elevated pulmonary artery systolic pressure. The estimated right ventricular systolic pressure is 54.7 mmHg.  3. Left atrial size was moderately dilated.  4. Right atrial size was mild to moderately dilated.  5. The mitral valve is grossly normal. Severe mitral valve regurgitation, systolic reversals pulmoanry veins. No evidence of mitral stenosis.  6. 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  7. The aortic valve is tricuspid. There is mild thickening of the aortic valve. Aortic valve regurgitation is trivial. No aortic stenosis is present.  8. The inferior vena cava is dilated  in size with <50% respiratory variability, suggesting right atrial pressure of 15 mmHg. FINDINGS  Left Ventricle: Left ventricular ejection fraction, by estimation, is 15-20%. The left ventricle has severely decreased function. The left ventricle demonstrates global hypokinesis. The left ventricular internal cavity size was moderately dilated. There  is borderline left ventricular hypertrophy. Left ventricular diastolic parameters are indeterminate. Right Ventricle: The right ventricular size is mildly enlarged. No increase in right ventricular wall thickness. Right ventricular systolic function is mildly reduced. There is moderately elevated pulmonary artery systolic pressure. The tricuspid regurgitant velocity is 3.15 m/s, and with an assumed right atrial pressure of 15 mmHg, the estimated right ventricular systolic pressure is 54.7 mmHg. Left Atrium: Left atrial size was moderately dilated. Right Atrium: Right atrial size was mild to moderately dilated. Pericardium: There is no evidence of pericardial effusion. Mitral Valve: The mitral valve is grossly normal. Severe mitral valve regurgitation. No evidence of mitral valve stenosis. MV peak gradient, 5.6 mmHg. The mean mitral valve gradient is 2.0 mmHg. Tricuspid Valve: The tricuspid valve is normal in structure. Tricuspid valve regurgitation is severe. No evidence of tricuspid stenosis. Aortic Valve: The aortic valve is tricuspid. There is mild thickening of the aortic valve. Aortic valve regurgitation is trivial. No aortic stenosis is present. Pulmonic Valve: The pulmonic valve was normal in structure. Pulmonic valve regurgitation is mild. No evidence of pulmonic stenosis. Aorta: The aortic root is normal in size and structure. Venous: A pattern of systolic flow reversal, suggestive of severe mitral regurgitation is recorded from the right lower pulmonary vein. The inferior vena cava is dilated in size with less than 50% respiratory variability, suggesting right  atrial pressure  of 15 mmHg. The inferior vena cava and the hepatic vein show a pattern of systolic flow reversal, suggestive of tricuspid regurgitation. IAS/Shunts: No atrial level shunt detected by color flow Doppler.  LEFT VENTRICLE PLAX 2D LVIDd:         6.11 cm LVIDs:         5.57 cm LV PW:         1.02 cm LV IVS:        0.93 cm LVOT diam:     1.72 cm LV SV:         27 LV SV Index:   17 LVOT Area:     2.32 cm  RIGHT VENTRICLE             IVC RV S prime:     10.70 cm/s  IVC diam: 2.22 cm TAPSE (M-mode): 1.5 cm LEFT ATRIUM             Index        RIGHT ATRIUM           Index LA diam:        4.73 cm 2.95 cm/m   RA Area:     20.90 cm LA Vol (A2C):   47.4 ml 29.52 ml/m  RA Volume:   62.20 ml  38.73 ml/m LA Vol (A4C):   75.2 ml 46.83 ml/m LA Biplane Vol: 60.9 ml 37.92 ml/m  AORTIC VALVE LVOT Vmax:   73.90 cm/s LVOT Vmean:  46.400 cm/s LVOT VTI:    0.115 m  AORTA Ao Root diam: 3.03 cm Ao Asc diam:  2.89 cm MITRAL VALVE              TRICUSPID VALVE MV Area VTI:  1.30 cm    TR Peak grad:   39.7 mmHg MV Peak grad: 5.6 mmHg    TR Vmax:        315.00 cm/s MV Mean grad: 2.0 mmHg MV Vmax:      1.18 m/s    SHUNTS MV Vmean:     71.3 cm/s   Systemic VTI:  0.12 m MR Peak grad: 87.2 mmHg   Systemic Diam: 1.72 cm MR Mean grad: 49.0 mmHg MR Vmax:      467.00 cm/s MR Vmean:     324.0 cm/s Soyla Merck MD Electronically signed by Soyla Merck MD Signature Date/Time: 04/02/2024/4:32:25 PM    Final    CT Angio Chest PE W and/or Wo Contrast Result Date: 04/01/2024 EXAM: CTA CHEST 04/01/2024 08:06:00 PM TECHNIQUE: CTA of the chest was performed without and with the administration of 75 mL of intravenous iohexol  (OMNIPAQUE ) 350 MG/ML injection. Multiplanar reformatted images are provided for review. MIP images are provided for review. Automated exposure control, iterative reconstruction, and/or weight based adjustment of the mA/kV was utilized to reduce the radiation dose to as low as reasonably achievable. COMPARISON:  Same-day chest radiograph and chest CT made on 01/26/2019. CLINICAL HISTORY: Pulmonary embolism (PE) suspected, low to intermediate prob, positive D-dimer. Shortness of breath FINDINGS: PULMONARY ARTERIES: Pulmonary arteries are adequately opacified for evaluation. No pulmonary embolism. Main pulmonary artery is normal in caliber. MEDIASTINUM: Cardiomegaly. Coronary artery aortic atherosclerotic calcification. Reflux of contrast into the IVC and hepatic veins is compatible with elevated right heart pressures. There is no acute abnormality of the thoracic aorta. LYMPH NODES: No mediastinal, hilar or axillary lymphadenopathy. LUNGS AND PLEURA: Small bilateral pleural effusions. Diffuse bronchial wall thickening. Mild interlobular septal thickening in the lower lungs. Patchy ground-glass opacities in  the posterior right upper lobe and both lower lobes. No pneumothorax. UPPER ABDOMEN: Limited images of the upper abdomen are unremarkable. SOFT TISSUES AND BONES: No acute bone or soft tissue abnormality. IMPRESSION: 1. No pulmonary embolism. 2. CHF and pulmonary edema. 3. Small bilateral pleural effusions. Electronically signed by: Norman Gatlin MD 04/01/2024 08:23 PM EDT RP Workstation: [id number]   DG Chest 2 View Result Date: 04/01/2024 CLINICAL DATA:  Shortness of breath EXAM: CHEST - 2 VIEW COMPARISON:  Chest x-ray performed March 13, 2024 FINDINGS: Heart is enlarged. Mild prominence of the central interstitium. No discrete lobar infiltrate. No pleural effusion or pneumothorax. Minimal basilar atelectasis is suspected. IMPRESSION: 1. Enlarged heart with minimal basilar atelectasis suspected. Electronically Signed   By: Maude Naegeli M.D.   On: 04/01/2024 13:59    Scheduled Meds:  aspirin  EC  81 mg Oral Daily   digoxin   0.125 mg Oral Daily   empagliflozin   10 mg Oral Daily   enoxaparin  (LOVENOX ) injection  40 mg Subcutaneous Q24H   furosemide   40 mg Intravenous BID   metoprolol  succinate  12.5 mg Oral  Daily   rosuvastatin   20 mg Oral Daily   spironolactone   12.5 mg Oral Daily   Continuous Infusions:   LOS: 0 days   Time spent: 55 minutes  Camellia Door, DO  [facility]  04/02/2024, 5:18 PM

## 2024-04-02 NOTE — Assessment & Plan Note (Deleted)
 Jesse Key

## 2024-04-03 DIAGNOSIS — N1832 Chronic kidney disease, stage 3b: Secondary | ICD-10-CM | POA: Diagnosis not present

## 2024-04-03 DIAGNOSIS — I5023 Acute on chronic systolic (congestive) heart failure: Secondary | ICD-10-CM | POA: Diagnosis not present

## 2024-04-03 DIAGNOSIS — I2510 Atherosclerotic heart disease of native coronary artery without angina pectoris: Secondary | ICD-10-CM | POA: Diagnosis not present

## 2024-04-03 LAB — BASIC METABOLIC PANEL WITH GFR
Anion gap: 10 (ref 5–15)
BUN: 27 mg/dL — ABNORMAL HIGH (ref 6–20)
CO2: 22 mmol/L (ref 22–32)
Calcium: 8.6 mg/dL — ABNORMAL LOW (ref 8.9–10.3)
Chloride: 104 mmol/L (ref 98–111)
Creatinine, Ser: 1.83 mg/dL — ABNORMAL HIGH (ref 0.61–1.24)
GFR, Estimated: 42 mL/min — ABNORMAL LOW (ref >60)
Glucose, Bld: 100 mg/dL — ABNORMAL HIGH (ref 70–99)
Potassium: 4.5 mmol/L (ref 3.5–5.1)
Sodium: 136 mmol/L (ref 135–145)

## 2024-04-03 LAB — DIGOXIN LEVEL: Digoxin Level: <0.6 ng/mL — ABNORMAL LOW (ref 0.8–2.0)

## 2024-04-03 LAB — MAGNESIUM: Magnesium: 2 mg/dL (ref 1.7–2.4)

## 2024-04-03 LAB — HIV ANTIBODY (ROUTINE TESTING W REFLEX): HIV Screen 4th Generation wRfx: NONREACTIVE

## 2024-04-03 MED ORDER — MAGNESIUM SULFATE 2 GM/50ML IV SOLN
2.0000 g | Freq: Once | INTRAVENOUS | Status: AC
Start: 2024-04-03 — End: 2024-04-03
  Administered 2024-04-03: 2 g via INTRAVENOUS
  Filled 2024-04-03: qty 50

## 2024-04-03 MED ORDER — FUROSEMIDE 10 MG/ML IJ SOLN
40.0000 mg | Freq: Once | INTRAMUSCULAR | Status: AC
  Administered 2024-04-03: 40 mg via INTRAVENOUS

## 2024-04-03 NOTE — Progress Notes (Signed)
 Progress Note   Patient: Jesse Key FMW:[id number] DOB: 1966-04-16 DOA: 04/01/2024     1 DOS: the patient was seen and examined on 04/03/2024   Brief hospital course: Jesse Key was admitted to the hospital with the working diagnosis of heart failure exacerbation.   Jesse Key is a 58 y.o. male with medical history significant of HFrEF (EF <20%), nonobstructive CAD, hypertension, hyperlipidemia, asthma, tobacco abuse presenting with a chief complaint of shortness of breath.  Patient states he had COVID 2 weeks ago and felt poorly so he stopped taking all of his cardiac medications.   His mouth was dry so he was constantly drinking water.   Then a week ago he started having shortness of breath, cough, orthopnea, and paroxysmal nocturnal dyspnea.  Dyspnea is worse with exertion.  H e was constantly using albuterol  inhaler at home with no improvement of his symptoms.   Patient states he has now started taking his home cardiac medications again including Lasix  20 mg daily again.   Also for the past few days he is having right-sided sharp chest pain.  Denies fevers.  No other complaints.   On his initial physical examination his vital signs were stable on arrival.  Labs notable for potassium 5.2, bicarb 20, creatinine 1.58, BNP >4500, troponin 18, D-dimer 0.51.  EKG without acute changes.  CT angiogram chest negative for PE but showing pulmonary edema and small bilateral pleural effusions. Patient was given IV Lasix  40 mg.  Not hypoxic but dyspneic with ambulation.      Assessment and Plan: * Acute on chronic systolic CHF (congestive heart failure) (HCC) Echocardiogram with reduced LV systolic function EF 15 to 20%, global hypokinesis, LV cavity withy moderate dilatation, RV systolic function with mild reduction, RV with mild enlargement, RVSP 54,7 mmHg, LA with moderate dilatation, RA with mild to moderate dilatation, severe mitral valve regurgitation, severe tricuspid valve  regurgitation,  Urine output is 2,150 ml Since admission is 6,128 ml negative Systolic blood pressure  115 mmHg range.   Plan to continue medical therapy with digoxin , empagliflozin , spironolactone  and metoprolol .  One dose of furosemide  IV 40 mg today.   CAD (coronary artery disease) No acute coronary syndrome. Continue aspirin  and statin    CKD stage 3b, GFR 30-44 ml/min (HCC) - baseline scr 1.3-1.5 Hyperkalemia   Renal function with serum cr at 1,83 with K at 4,5 and serum bicarbonate at 22  Na 136 and Mg 2.0   Plan to continue close follow up renal function and electrolytes.      Subjective: Patient feeling better, with chest pain, no dyspnea, no PND or orthopnea,   Physical Exam: Vitals:   04/03/24 0443 04/03/24 0732 04/03/24 1102 04/03/24 1513  BP: 116/88 117/83 110/78 111/66  Pulse: 86 79 80 81  Resp: 17 20  20   Temp: 98.1 F (36.7 C) 98.7 F (37.1 C) (!) 97.4 F (36.3 C) 97.8 F (36.6 C)  TempSrc: Oral Oral Oral Oral  SpO2: 100% 100% 98% 95%  Weight: 56.8 kg     Height:       Neurology awake and alert ENT with mild pallor with no icterus Cardiovascular with S1 and S2 present and regular with no gallops, rubs or murmurs Respiratory with no rales or wheezing, no rhonchi  Abdomen with no distention  No lower extremity edema Data Reviewed:    Family Communication: no family at the bedside   Disposition: Status is: Inpatient Remains inpatient appropriate because: recovering heart failure  Planned Discharge Destination: Home     Author: Elidia Toribio Furnace, MD 04/03/2024 4:21 PM  For on call review [email or web address].

## 2024-04-03 NOTE — Plan of Care (Signed)

## 2024-04-03 NOTE — Assessment & Plan Note (Signed)
 Echocardiogram with reduced LV systolic function EF 15 to 20%, global hypokinesis, LV cavity withy moderate dilatation, RV systolic function with mild reduction, RV with mild enlargement, RVSP 54,7 mmHg, LA with moderate dilatation, RA with mild to moderate dilatation, severe mitral valve regurgitation, severe tricuspid valve regurgitation,  Urine output is 2,150 ml Since admission is 6,128 ml negative Systolic blood pressure  115 mmHg range.   Plan to continue medical therapy with digoxin , empagliflozin , spironolactone  and metoprolol .  One dose of furosemide  IV 40 mg today.

## 2024-04-03 NOTE — Progress Notes (Addendum)
 Advanced Heart Failure Rounding Note  HF Cardiologist: Dr. Cherrie  Chief Complaint: Acute on chronic systolic CHF  Subjective:    Is/Os not complete but weight down 7 lb with diuresis.  Scr 1.55>1.83, BUN trending up.  Feeling better. Much less short of breath. Legs are cramping with diuretics.  Echo 9/30: EF 15-20%, RV mildly reduced, RVSP 55 mmHg, severe MR, severe TR, dilated IVC   Objective:   Weight Range: 56.8 kg Body mass index is 22.9 kg/m.   Vital Signs:   Temp:  [97.7 F (36.5 C)-98.7 F (37.1 C)] 98.7 F (37.1 C) (10/01 0732) Pulse Rate:  [72-95] 79 (10/01 0732) Resp:  [16-32] 20 (10/01 0732) BP: (94-130)/(64-88) 117/83 (10/01 0732) SpO2:  [96 %-100 %] 100 % (10/01 0732) Weight:  [56.8 kg-60.2 kg] 56.8 kg (10/01 0443) Last BM Date : 04/02/24  Weight change: Filed Weights   04/01/24 1313 04/02/24 1430 04/03/24 0443  Weight: 63.5 kg 60.2 kg 56.8 kg    Intake/Output:   Intake/Output Summary (Last 24 hours) at 04/03/2024 0814 Last data filed at 04/03/2024 0447 Gross per 24 hour  Intake 537 ml  Output 1550 ml  Net -1013 ml      Physical Exam    General:  Thin, chronically ill appearing Cor: JVP 8-9 with prominent v waves. Regular rate & rhythm. 3/6 holosystolic murmur. Lungs: Clear Abdomen: Soft, nontender, nondistended.  Extremities: No edema Neuro: Alert & orientedx3. Affect pleasant   Telemetry   SR 80s, intermittently rate speeds up and p waves appear inverted (atrial tachycardia?)  Labs    CBC Recent Labs    04/01/24 1327  WBC 9.7  NEUTROABS 6.3  HGB 14.5  HCT 46.8  MCV 75.6*  PLT 280   Basic Metabolic Panel Recent Labs    90/69/74 0849 04/03/24 0304  NA 139 136  K 4.4 4.5  CL 103 104  CO2 25 22  GLUCOSE 120* 100*  BUN 15 27*  CREATININE 1.55* 1.83*  CALCIUM  8.5* 8.6*  MG  --  2.0   Liver Function Tests Recent Labs    04/01/24 1327  AST 27  ALT 21  ALKPHOS 74  BILITOT 1.1  PROT 6.5  ALBUMIN 3.3*    No results for input(s): LIPASE, AMYLASE in the last 72 hours. Cardiac Enzymes No results for input(s): CKTOTAL, CKMB, CKMBINDEX, TROPONINI in the last 72 hours.  BNP: BNP (last 3 results) Recent Labs    03/13/24 0256 04/01/24 1327  BNP >4,500.0* >4,500.0*    ProBNP (last 3 results) No results for input(s): PROBNP in the last 8760 hours.   D-Dimer Recent Labs    04/01/24 1749  DDIMER 0.51*   Hemoglobin A1C No results for input(s): HGBA1C in the last 72 hours. Fasting Lipid Panel No results for input(s): CHOL, HDL, LDLCALC, TRIG, CHOLHDL, LDLDIRECT in the last 72 hours. Thyroid Function Tests No results for input(s): TSH, T4TOTAL, T3FREE, THYROIDAB in the last 72 hours.  Invalid input(s): FREET3  Other results:   Imaging    ECHOCARDIOGRAM COMPLETE Result Date: 04/02/2024    ECHOCARDIOGRAM REPORT   Patient Name:   Jesse Key Penza Date of Exam: 04/02/2024 Medical Rec #:  [id number]        Height:       62.0 in Accession #:    [id number]       Weight:       132.7 lb Date of Birth:  04-20-1966         BSA:  1.606 m Patient Age:    58 years         BP:           110/75 mmHg Patient Gender: M                HR:           94 bpm. Exam Location:  Inpatient Procedure: 2D Echo, Cardiac Doppler and Color Doppler (Both Spectral and Color            Flow Doppler were utilized during procedure). Indications:    CHF- Acute Systolic I50.21  History:        Patient has no prior history of Echocardiogram examinations.                 CHF, CAD, Signs/Symptoms:Chest Pain; Risk Factors:Current                 Smoker.  Sonographer:    Koleen Popper RDCS Referring Phys: [id number] RATHORE  Sonographer Comments: Definity  contrast attempted- no IV access. IMPRESSIONS  1. Left ventricular ejection fraction, by estimation, is 15-20%. The left ventricle has severely decreased function. The left ventricle demonstrates global hypokinesis. The left  ventricular internal cavity size was moderately dilated. Left ventricular diastolic parameters are indeterminate.  2. Right ventricular systolic function is mildly reduced. The right ventricular size is mildly enlarged. There is moderately elevated pulmonary artery systolic pressure. The estimated right ventricular systolic pressure is 54.7 mmHg.  3. Left atrial size was moderately dilated.  4. Right atrial size was mild to moderately dilated.  5. The mitral valve is grossly normal. Severe mitral valve regurgitation, systolic reversals pulmoanry veins. No evidence of mitral stenosis.  6. 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  7. The aortic valve is tricuspid. There is mild thickening of the aortic valve. Aortic valve regurgitation is trivial. No aortic stenosis is present.  8. The inferior vena cava is dilated in size with <50% respiratory variability, suggesting right atrial pressure of 15 mmHg. FINDINGS  Left Ventricle: Left ventricular ejection fraction, by estimation, is 15-20%. The left ventricle has severely decreased function. The left ventricle demonstrates global hypokinesis. The left ventricular internal cavity size was moderately dilated. There  is borderline left ventricular hypertrophy. Left ventricular diastolic parameters are indeterminate. Right Ventricle: The right ventricular size is mildly enlarged. No increase in right ventricular wall thickness. Right ventricular systolic function is mildly reduced. There is moderately elevated pulmonary artery systolic pressure. The tricuspid regurgitant velocity is 3.15 m/s, and with an assumed right atrial pressure of 15 mmHg, the estimated right ventricular systolic pressure is 54.7 mmHg. Left Atrium: Left atrial size was moderately dilated. Right Atrium: Right atrial size was mild to moderately dilated. Pericardium: There is no evidence of pericardial effusion. Mitral Valve: The mitral valve is  grossly normal. Severe mitral valve regurgitation. No evidence of mitral valve stenosis. MV peak gradient, 5.6 mmHg. The mean mitral valve gradient is 2.0 mmHg. Tricuspid Valve: The tricuspid valve is normal in structure. Tricuspid valve regurgitation is severe. No evidence of tricuspid stenosis. Aortic Valve: The aortic valve is tricuspid. There is mild thickening of the aortic valve. Aortic valve regurgitation is trivial. No aortic stenosis is present. Pulmonic Valve: The pulmonic valve was normal in structure. Pulmonic valve regurgitation is mild. No evidence of pulmonic stenosis. Aorta: The aortic root is normal in size and structure. Venous: A pattern of systolic flow reversal, suggestive of severe mitral  regurgitation is recorded from the right lower pulmonary vein. The inferior vena cava is dilated in size with less than 50% respiratory variability, suggesting right atrial pressure  of 15 mmHg. The inferior vena cava and the hepatic vein show a pattern of systolic flow reversal, suggestive of tricuspid regurgitation. IAS/Shunts: No atrial level shunt detected by color flow Doppler.  LEFT VENTRICLE PLAX 2D LVIDd:         6.11 cm LVIDs:         5.57 cm LV PW:         1.02 cm LV IVS:        0.93 cm LVOT diam:     1.72 cm LV SV:         27 LV SV Index:   17 LVOT Area:     2.32 cm  RIGHT VENTRICLE             IVC RV S prime:     10.70 cm/s  IVC diam: 2.22 cm TAPSE (M-mode): 1.5 cm LEFT ATRIUM             Index        RIGHT ATRIUM           Index LA diam:        4.73 cm 2.95 cm/m   RA Area:     20.90 cm LA Vol (A2C):   47.4 ml 29.52 ml/m  RA Volume:   62.20 ml  38.73 ml/m LA Vol (A4C):   75.2 ml 46.83 ml/m LA Biplane Vol: 60.9 ml 37.92 ml/m  AORTIC VALVE LVOT Vmax:   73.90 cm/s LVOT Vmean:  46.400 cm/s LVOT VTI:    0.115 m  AORTA Ao Root diam: 3.03 cm Ao Asc diam:  2.89 cm MITRAL VALVE              TRICUSPID VALVE MV Area VTI:  1.30 cm    TR Peak grad:   39.7 mmHg MV Peak grad: 5.6 mmHg    TR Vmax:         315.00 cm/s MV Mean grad: 2.0 mmHg MV Vmax:      1.18 m/s    SHUNTS MV Vmean:     71.3 cm/s   Systemic VTI:  0.12 m MR Peak grad: 87.2 mmHg   Systemic Diam: 1.72 cm MR Mean grad: 49.0 mmHg MR Vmax:      467.00 cm/s MR Vmean:     324.0 cm/s Soyla Merck MD Electronically signed by Soyla Merck MD Signature Date/Time: 04/02/2024/4:32:25 PM    Final      Medications:     Scheduled Medications:  aspirin  EC  81 mg Oral Daily   digoxin   0.125 mg Oral Daily   empagliflozin   10 mg Oral Daily   enoxaparin  (LOVENOX ) injection  40 mg Subcutaneous Q24H   furosemide   40 mg Intravenous BID   metoprolol  succinate  12.5 mg Oral Daily   rosuvastatin   20 mg Oral Daily   spironolactone   12.5 mg Oral Daily    Infusions:   PRN Medications: acetaminophen  **OR** acetaminophen , albuterol , melatonin, methocarbamol, mouth rinse    Patient Profile   58 y.o. male with history of chronic systolic CHF, NICM, LBBB, CAD, HTN, hx tobacco and ETOH abuse.    Admitted with acute on chronic systolic CHF following recent RNCPI-80 infection and nonadherence with medications.  Assessment/Plan   1. Acute on chronic systolic CHF: Nonischemic cardiomyopathy. Echo in 5/25 showed EF < 20%, RV nl, global  HK, mid-mod MR. RHC/LHC in 5/25 showed non-obstructive CAD, well-compensated filling pressures with CI 2.07.  Cardiac MRI in 5/25 showed LV EF 18%, RV EF 35%, nonspecific RV insertion site LGE.  Echo 9/30: EF 15-20%, RV mildly reduced, RVSP 55 mmHg, severe MR, severe TR, dilated IVCHe has baseline wide LBBB/IVCD, he may have a LBBB cardiomyopathy. He is volume overloaded on exam after stopping his meds x 2 wks.  He seems warm/well-perfused.  - Scr trending up and BUN elevated this am. Volume still appears up on exam. Give 1 more dose IV lasix  40 mg then stop. May need RHC prior to discharge - Continue digoxin  0.125 daily. Level < 0.6. - Continue Jardiance  10 daily - Continue Toprol  XL 12.5 daily - Continue  spironolactone  12.5 daily. - No further room for GDMT titration with AKI. - Suspect he will eventually benefit from CRT-D.  2. AKI on CKD stage 3: Creatinine 1.55>1.83.  - Likely cardiorenal - Follow with diuresis 3. 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  Minimally elevated troponin with no trend, suspect demand ischemia.  - Continue ASA, statin.  4. Cramps: In setting of diuretics.  - K 4.5, Mag 2.0 - Give 2 g IV mag today - Can use mustard as needed 5. SDOH: Poor health literacy. Not compliant, confused about meds - Referral placed for paramedicine - HF TOC CSW/CM has seen regarding food and housing  Length of Stay: 1  FINCH, LINDSAY N, PA-C  04/03/2024, 8:14 AM  Advanced Heart Failure Team Pager [phone] (M-F; 7a - 5p)  Please contact [facility] for night-coverage after hours (5p -7a ) and weekends on [email or web address]  Patient seen with PA, I formulated the plan and agree with the above note.   SBP 110s this morning, creatinine 1.55 => 1.83.  Weight down, not all I/Os recorded.   Breathing improved.   General: NAD Neck: JVP 7-8 cm, no thyromegaly or thyroid nodule.  Lungs: Clear to auscultation bilaterally with normal respiratory effort. CV: Nondisplaced PMI.  Heart regular S1/S2, no S3/S4, no murmur.  No peripheral edema.   Abdomen: Soft, nontender, no hepatosplenomegaly, no distention.  Skin: Intact without lesions or rashes.  Neurologic: Alert and oriented x 3.  Psych: Normal affect. Extremities: No clubbing or cyanosis.  HEENT: Normal.   Volume status looks better today, weight down.  Would give 1 more dose of IV Lasix  today as above.  Continue other meds, will not start ARB/ARNI yet with mild AKI.    Ezra Shuck 04/03/2024 10:46 AM

## 2024-04-04 ENCOUNTER — Telehealth (HOSPITAL_COMMUNITY): Payer: Self-pay | Admitting: Pharmacy Technician

## 2024-04-04 ENCOUNTER — Other Ambulatory Visit (HOSPITAL_COMMUNITY): Payer: Self-pay

## 2024-04-04 ENCOUNTER — Telehealth: Payer: Self-pay

## 2024-04-04 DIAGNOSIS — I5023 Acute on chronic systolic (congestive) heart failure: Secondary | ICD-10-CM | POA: Diagnosis not present

## 2024-04-04 DIAGNOSIS — M109 Gout, unspecified: Secondary | ICD-10-CM | POA: Diagnosis not present

## 2024-04-04 DIAGNOSIS — N1832 Chronic kidney disease, stage 3b: Secondary | ICD-10-CM | POA: Diagnosis not present

## 2024-04-04 DIAGNOSIS — I2510 Atherosclerotic heart disease of native coronary artery without angina pectoris: Secondary | ICD-10-CM | POA: Diagnosis not present

## 2024-04-04 LAB — BASIC METABOLIC PANEL WITH GFR
Anion gap: 11 (ref 5–15)
BUN: 29 mg/dL — ABNORMAL HIGH (ref 6–20)
CO2: 22 mmol/L (ref 22–32)
Calcium: 8.5 mg/dL — ABNORMAL LOW (ref 8.9–10.3)
Chloride: 105 mmol/L (ref 98–111)
Creatinine, Ser: 1.72 mg/dL — ABNORMAL HIGH (ref 0.61–1.24)
GFR, Estimated: 45 mL/min — ABNORMAL LOW (ref >60)
Glucose, Bld: 105 mg/dL — ABNORMAL HIGH (ref 70–99)
Potassium: 4.4 mmol/L (ref 3.5–5.1)
Sodium: 138 mmol/L (ref 135–145)

## 2024-04-04 LAB — URIC ACID: Uric Acid, Serum: 9 mg/dL — ABNORMAL HIGH (ref 3.7–8.6)

## 2024-04-04 LAB — MAGNESIUM: Magnesium: 2.3 mg/dL (ref 1.7–2.4)

## 2024-04-04 MED ORDER — FUROSEMIDE 40 MG PO TABS
40.0000 mg | ORAL_TABLET | Freq: Every day | ORAL | 0 refills | Status: DC
  Filled 2024-04-04: qty 30, 30d supply, fill #0

## 2024-04-04 MED ORDER — SACUBITRIL-VALSARTAN 24-26 MG PO TABS
1.0000 | ORAL_TABLET | Freq: Two times a day (BID) | ORAL | Status: DC
  Administered 2024-04-04: 1 via ORAL
  Filled 2024-04-04: qty 1

## 2024-04-04 MED ORDER — SACUBITRIL-VALSARTAN 24-26 MG PO TABS
1.0000 | ORAL_TABLET | Freq: Two times a day (BID) | ORAL | 0 refills | Status: DC
  Filled 2024-04-04: qty 60, 30d supply, fill #0

## 2024-04-04 MED ORDER — PREDNISONE 20 MG PO TABS
40.0000 mg | ORAL_TABLET | Freq: Every day | ORAL | Status: DC
  Administered 2024-04-04: 40 mg via ORAL
  Filled 2024-04-04: qty 2

## 2024-04-04 MED ORDER — FUROSEMIDE 40 MG PO TABS
40.0000 mg | ORAL_TABLET | Freq: Every day | ORAL | Status: DC
  Administered 2024-04-04: 40 mg via ORAL
  Filled 2024-04-04: qty 1

## 2024-04-04 MED ORDER — SPIRONOLACTONE 25 MG PO TABS
12.5000 mg | ORAL_TABLET | Freq: Every day | ORAL | 0 refills | Status: DC
  Filled 2024-04-04: qty 15, 30d supply, fill #0

## 2024-04-04 MED ORDER — LOSARTAN POTASSIUM 25 MG PO TABS: 25.0000 mg | ORAL_TABLET | Freq: Every day | ORAL | Status: DC

## 2024-04-04 MED ORDER — PREDNISONE 20 MG PO TABS
40.0000 mg | ORAL_TABLET | Freq: Every day | ORAL | 0 refills | Status: DC
  Filled 2024-04-04: qty 4, 2d supply, fill #0

## 2024-04-04 NOTE — Assessment & Plan Note (Signed)
 Left foot 5th toe pain, positive hyperuricemia with uric acid at 9  Will placed on prednisone  40 mg po daily.  Plan to follow up uric acid as outpatient, he may benefit from uric acid lowering therapy if persistent uric acid elevation.,

## 2024-04-04 NOTE — Progress Notes (Addendum)
 Advanced Heart Failure Rounding Note  HF Cardiologist: Dr. Cherrie  Chief Complaint: Acute on chronic systolic CHF  Subjective:    Shortness of breath resolved. Feels back to baseline. Only complaint is severe left 5th toe pain that started yesterday.   Echo 9/30: EF 15-20%, RV mildly reduced, RVSP 55 mmHg, severe MR, severe TR, dilated IVC   Objective:   Weight Range: 57.1 kg Body mass index is 23.02 kg/m.   Vital Signs:   Temp:  [97.4 F (36.3 C)-98.8 F (37.1 C)] 97.4 F (36.3 C) (10/02 0815) Pulse Rate:  [80-97] 80 (10/02 0815) Resp:  [18-20] 19 (10/02 0815) BP: (109-122)/(66-84) 116/75 (10/02 0815) SpO2:  [95 %-100 %] 100 % (10/02 0815) Weight:  [57.1 kg] 57.1 kg (10/02 0500) Last BM Date : 04/03/24  Weight change: Filed Weights   04/02/24 1430 04/03/24 0443 04/04/24 0500  Weight: 60.2 kg 56.8 kg 57.1 kg    Intake/Output:   Intake/Output Summary (Last 24 hours) at 04/04/2024 0843 Last data filed at 04/04/2024 0600 Gross per 24 hour  Intake 960 ml  Output 2845 ml  Net -1885 ml      Physical Exam    General:  Thin, appears younger than stated age. Neck: JVP ~ 7 with prominent v waves Cor: Regular rate & rhythm. 3/6 holosystolic murmur at apex Lungs: clear Abdomen: soft, nontender, nondistended.  Extremities: no edema Neuro: alert & orientedx3. Affect pleasant   Telemetry   SR 80s  Labs    CBC Recent Labs    04/01/24 1327  WBC 9.7  NEUTROABS 6.3  HGB 14.5  HCT 46.8  MCV 75.6*  PLT 280   Basic Metabolic Panel Recent Labs    89/98/74 0304 04/04/24 0224  NA 136 138  K 4.5 4.4  CL 104 105  CO2 22 22  GLUCOSE 100* 105*  BUN 27* 29*  CREATININE 1.83* 1.72*  CALCIUM  8.6* 8.5*  MG 2.0 2.3   Liver Function Tests Recent Labs    04/01/24 1327  AST 27  ALT 21  ALKPHOS 74  BILITOT 1.1  PROT 6.5  ALBUMIN 3.3*   No results for input(s): LIPASE, AMYLASE in the last 72 hours. Cardiac Enzymes No results for input(s):  CKTOTAL, CKMB, CKMBINDEX, TROPONINI in the last 72 hours.  BNP: BNP (last 3 results) Recent Labs    03/13/24 0256 04/01/24 1327  BNP >4,500.0* >4,500.0*    ProBNP (last 3 results) No results for input(s): PROBNP in the last 8760 hours.   D-Dimer Recent Labs    04/01/24 1749  DDIMER 0.51*   Hemoglobin A1C No results for input(s): HGBA1C in the last 72 hours. Fasting Lipid Panel No results for input(s): CHOL, HDL, LDLCALC, TRIG, CHOLHDL, LDLDIRECT in the last 72 hours. Thyroid Function Tests No results for input(s): TSH, T4TOTAL, T3FREE, THYROIDAB in the last 72 hours.  Invalid input(s): FREET3  Other results:   Imaging    No results found.    Medications:     Scheduled Medications:  aspirin  EC  81 mg Oral Daily   digoxin   0.125 mg Oral Daily   empagliflozin   10 mg Oral Daily   enoxaparin  (LOVENOX ) injection  40 mg Subcutaneous Q24H   metoprolol  succinate  12.5 mg Oral Daily   rosuvastatin   20 mg Oral Daily   spironolactone   12.5 mg Oral Daily    Infusions:   PRN Medications: acetaminophen  **OR** acetaminophen , albuterol , melatonin, methocarbamol, mouth rinse    Patient Profile   58  y.o. male with history of chronic systolic CHF, NICM, LBBB, CAD, HTN, hx tobacco and ETOH abuse.    Admitted with acute on chronic systolic CHF following recent RNCPI-80 infection and nonadherence with medications.  Assessment/Plan   1. Acute on chronic systolic CHF: Nonischemic cardiomyopathy. Echo in 5/25 showed EF < 20%, RV nl, global HK, mid-mod MR. RHC/LHC in 5/25 showed non-obstructive CAD, well-compensated filling pressures with CI 2.07.  Cardiac MRI in 5/25 showed LV EF 18%, RV EF 35%, nonspecific RV insertion site LGE.  Echo 9/30: EF 15-20%, RV mildly reduced, RVSP 55 mmHg, severe MR, severe TR, dilated IVC.  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 He is volume overloaded on exam after stopping his meds  x 2 wks.  He seems warm/well-perfused.  - Volume improved. Restart po lasix  at 40 mg daily (previously 20 mg daily) - Continue digoxin  0.125 daily. Level < 0.6. - Continue Jardiance  10 daily - Continue Toprol  XL 12.5 daily - Continue spironolactone  12.5 daily. - Start Entresto  24/26 bid - No further room for GDMT titration with AKI. - Suspect he will eventually benefit from CRT-D.  2. AKI on CKD stage 3: Creatinine 1.55>1.83>1.72.  - Likely cardiorenal 3. 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  Minimally elevated troponin with no trend, suspect demand ischemia.  - Continue ASA, statin.  4. Left 5th toe pain: Started yesterday. Extremely tender to palpation.  - Check uric acid - Will empirically treat for acute gout flare with 40 mg prednisone  X 3 days. If recurs, may need allopurinol and colchicine. 5. SDOH: Poor health literacy. Not compliant, confused about meds - Referral placed for paramedicine - HF TOC CSW/CM has seen regarding food and housing  Likely ready for discharge home from HF standpoint. Will review with Dr. Rolan.  Has f/u scheduled in HF clinic.  HF meds at discharge: ASA 81 mg daily Digoxin  0.125 mg daily Jardiance  10 mg daily Entresto  24/26 bid Metoprolol  succinate 12.5 mg daily Rosuvastatin  20 mg daily Spironolactone  12.5 mg daily Furosemide  40 mg daily (will not add K supp with history of hyperkalemia) Prednisone  40 mg X 3 days Chantix  Length of Stay: 2  FINCH, LINDSAY N, PA-C  04/04/2024, 8:44 AM  Advanced Heart Failure Team Pager [phone] (M-F; 7a - 5p)  Please contact [facility] for night-coverage after hours (5p -7a ) and weekends on [email or web address]  Patient seen with PA, I formulated the plan and agree with the above note.   I/Os net negative 1885.  Creatinine 1.83 => 1.72.  He feels good today.  SBP 110s-120s.   Toe pain left foot.   General: NAD Neck: No JVD, no thyromegaly or thyroid nodule.  Lungs: Clear to auscultation bilaterally with  normal respiratory effort. CV: Lateral PMI.  Heart regular S1/S2, no S3/S4, no murmur.  No peripheral edema.   Abdomen: Soft, nontender, no hepatosplenomegaly, no distention.  Skin: Intact without lesions or rashes.  Neurologic: Alert and oriented x 3.  Psych: Normal affect. Extremities: No clubbing or cyanosis.  HEENT: Normal.   He looks euvolemic.  Feels much better.   Suspect gouty pain left toe.  Will give prednisone  burst.   I think he can start on Entresto , SBP 110s-120s.   Wants to quit smoking, will see if we can get Chantix for him as outpatient.   He can go home today on the above meds, will make followup.   Ezra Rolan 04/04/2024 9:38 AM

## 2024-04-04 NOTE — Telephone Encounter (Signed)
 Patient Product/process development scientist completed.    The patient is insured through [employer] and [employer].     Ran test claim for sacubitril -valsartan  (Entersto) 24-26 mg and the current 30 day co-pay is $4.00.   This test claim was processed through [facility]- copay amounts may vary at other pharmacies due to pharmacy/plan contracts, or as the patient moves through the different stages of their insurance plan.     Reyes Sharps, CPHT Pharmacy Technician III Certified Patient Advocate [facility] Patient Advocate Team Direct Number: [phone]  Fax: [phone]

## 2024-04-04 NOTE — Telephone Encounter (Signed)
 Can you call schedule this patient please.

## 2024-04-04 NOTE — TOC Transition Note (Signed)
 Transition of Care [facility]) - Discharge Note   Patient Details  Name: Jesse Key MRN: [id number] Date of Birth: 03-24-1966  [facility] of [facility]) CM/SW Contact:  Waddell Barnie Rama, RN Phone Number: 04/04/2024, 3:25 PM   Clinical Narrative:    For dc today, no ICM consult for needs.      Barriers to Discharge: Continued Medical Work up   Patient Goals and CMS Choice Patient states their goals for this hospitalization and ongoing recovery are:: wants to recover          Discharge Placement                       Discharge Plan and Services Additional resources added to the After Visit Summary for     Discharge Planning Services: CM Consult                                 Social Drivers of Health (SDOH) Interventions SDOH Screenings   Food Insecurity: Food Insecurity Present (04/02/2024)  Housing: High Risk (04/02/2024)  Transportation Needs: Unmet Transportation Needs (04/02/2024)  Utilities: At Risk (04/02/2024)  Tobacco Use: High Risk (04/01/2024)     Readmission Risk Interventions     No data to display

## 2024-04-04 NOTE — Discharge Summary (Addendum)
 Physician Discharge Summary   Patient: Jesse Key MRN: [id number] DOB: 01-02-1966  Admit date:     04/01/2024  Discharge date: 04/04/24  Discharge Physician: Elidia Sieving Veleta Yamamoto   PCP: Patient, No Pcp Per   Recommendations at discharge:    Patient placed on entresto , and spironolactone , continue SGLT 2 inh and metoprolol  succinate for heart failure guideline directed medical therapy. Increased furosemide  to 40 mg po daily.  Added 3 days of prednisone  for acute gout attack, follow up uric acid as outpatient, considering  his renal impairment he may benefit from uric acid lowering therapy.  Follow up renal function and electrolytes in 7 days as outpatient. Needs outpatient follow up for smoking cessation, consider varenicline (Chantix) Follow up with primary care in 7 to 10 days Follow up with Cardiology as scheduled.   Discharge Diagnoses: Principal Problem:   Acute on chronic systolic CHF (congestive heart failure) (HCC) Active Problems:   CAD (coronary artery disease)   CKD stage 3b, GFR 30-44 ml/min (HCC) - baseline scr 1.3-1.5  Resolved Problems:   * No resolved hospital problems. [facility] Course: Jesse Key was admitted to the hospital with the working diagnosis of heart failure exacerbation.   Jesse Key is a 58 y.o. male with medical history heart failure, nonobstructive CAD, hypertension, hyperlipidemia, asthma, tobacco abuse presenting with a chief complaint of dyspnea. Patient had COVID 2 weeks prior to admission, he felt poorly so he stopped taking all of his cardiac medications. A week later he started having shortness of breath, cough, orthopnea, and paroxysmal nocturnal dyspnea. On his initial physical examination his blood pressure was 109/74, HR 91, RR 14 02 saturation 96%,  Lungs with bilateral rales with no wheezing, heart with S1 and S2 present and regular, with no gallops, rubs or murmurs, abdomen with no distention and no lower extremity edema.    Na 135, K 5.2 Cl 105 bicarbonate 20, glucose 89 bun 14 cr 1,58  AST 27 ALT 21 BNP >4,500  High sensitive troponin 18  Wbc 9,7 hgb 14.5 plt 280  D dimer 0,51   Chest radiograph with cardiomegaly, bilateral hilar vascular congestion, small left pleural effusion.   CT chest with bilateral ground glass opacities, bilateral small pleural effusions with basilar atelectasis.  No pulmonary embolism.   EKG 96 bpm, right axis deviation, left bundle branch block, qtc 507, sinus rhythm with bilateral atrial enlargement, ST depression and T wave inversions at lead II, III, aVF, V5 and V6, positive LVH.   Patient was placed on furosemide  for diuresis.  Echocardiogram with reduced LV systolic function.  10/02 euvolemic, patient will continue medical therapy and will need close follow up as outpatient.   Assessment and Plan: * Acute on chronic systolic CHF (congestive heart failure) (HCC) Echocardiogram with reduced LV systolic function EF 15 to 20%, global hypokinesis, LV cavity withy moderate dilatation, RV systolic function with mild reduction, RV with mild enlargement, RVSP 54,7 mmHg, LA with moderate dilatation, RA with mild to moderate dilatation, severe mitral valve regurgitation, severe tricuspid valve regurgitation,  Acute on chronic cor pulmonale.   Patient was placed on IV furosemide  for diuresis, negative fluid balance was achieved, - 6,118 ml, with significant improvement in his symptoms.   Plan to continue medical therapy with digoxin , empagliflozin , spironolactone  and metoprolol .  Entresto  for afterload reduction.  Loop diuretic therapy for diuresis.   CAD (coronary artery disease) No acute coronary syndrome. High sensitive troponin elevation due to heart failure exacerbation.  Continue aspirin   and statin    CKD stage 3b, GFR 30-44 ml/min (HCC) - baseline scr 1.3-1.5 Hyperkalemia   At the time of discharge his renal function had a serum cr of 1,72 with K at 4,4 and serum  bicarbonate at 22 Na 138 and Mg 2.3   Resume diuretic therapy with furosemide . Continue with spironolactone  and SGLT 2 inh  Follow up renal function as outpatient in 7 days  Acute gout Left foot 5th toe pain, positive hyperuricemia with uric acid at 9  Will placed on prednisone  40 mg po daily.  Plan to follow up uric acid as outpatient, he may benefit from uric acid lowering therapy if persistent uric acid elevation.,       Consultants: cardiology  Procedures performed: none   Disposition: Home Diet recommendation:  Cardiac diet DISCHARGE MEDICATION: Allergies as of 04/04/2024       Reactions   Shrimp [shellfish Allergy] Anaphylaxis, Itching   ALL SHELLFISH        Medication List     STOP taking these medications    losartan  25 MG tablet Commonly known as: Cozaar        TAKE these medications    Aspirin  Low Dose 81 MG tablet Generic drug: aspirin  EC Take 1 tablet (81 mg total) by mouth daily. Swallow whole.   digoxin  0.125 MG tablet Commonly known as: Lanoxin  Take 1 tablet (0.125 mg total) by mouth daily.   furosemide  40 MG tablet Commonly known as: LASIX  Take 1 tablet (40 mg total) by mouth daily. Start taking on: April 05, 2024 What changed:  medication strength how much to take   Jardiance  10 MG Tabs tablet Generic drug: empagliflozin  Take 1 tablet (10 mg total) by mouth daily before breakfast.   metoprolol  succinate 25 MG 24 hr tablet Commonly known as: TOPROL -XL Take 0.5 tablets (12.5 mg total) by mouth daily.   predniSONE  20 MG tablet Commonly known as: DELTASONE  Take 2 tablets (40 mg total) by mouth daily with breakfast. Start taking on: April 05, 2024   rosuvastatin  20 MG tablet Commonly known as: Crestor  Take 1 tablet (20 mg total) by mouth daily.   sacubitril -valsartan  24-26 MG Commonly known as: ENTRESTO  Take 1 tablet by mouth 2 (two) times daily.   spironolactone  25 MG tablet Commonly known as: ALDACTONE  Take 0.5 tablets  (12.5 mg total) by mouth daily. Start taking on: April 05, 2024   Ventolin  HFA 108 (90 Base) MCG/ACT inhaler Generic drug: albuterol  Inhale 1-2 puffs into the lungs every 6 (six) hours as needed for wheezing or shortness of breath.        Follow-up Information     [facility] Follow up on 04/12/2024.   Specialty: Cardiology Why: Advanced [facility] Failure Clinic 9:30 AM Entrance C, Free Valet Parking Contact information: [address] [place]  [phone]               Discharge Exam: Fredricka Weights   04/02/24 1430 04/03/24 0443 04/04/24 0500  Weight: 60.2 kg 56.8 kg 57.1 kg   BP 107/77 (BP Location: Left Arm)   Pulse 80   Temp 97.7 F (36.5 C) (Oral)   Resp 19   Ht 5' 2 (1.575 m)   Wt 57.1 kg   SpO2 100%   BMI 23.02 kg/m   Patient is feeling better, no dyspnea or chest pain, no lower extremity edema, PND or orthopnea  Neurology awake and alert ENT with no pallor or icterus  Cardiovascular with S1 and S2 present and regular with no gallops, or rubs, positive systolic murmur at the apex and left lower sternal border.  No JVD Respiratory with no rales or wheezing, no rhonchi  Abdomen with no distention  No lower extremity edema   Condition at discharge: stable  The results of significant diagnostics from this hospitalization (including imaging, microbiology, ancillary and laboratory) are listed below for reference.   Imaging Studies: ECHOCARDIOGRAM COMPLETE Result Date: 04/02/2024    ECHOCARDIOGRAM REPORT   Patient Name:   Jesse Key Date of Exam: 04/02/2024 Medical Rec #:  [id number]        Height:       62.0 in Accession #:    [id number]       Weight:       132.7 lb Date of Birth:  07-02-1966         BSA:          1.606 m Patient Age:    58 years         BP:           110/75 mmHg Patient Gender: M                HR:           94 bpm. Exam Location:  Inpatient Procedure: 2D Echo,  Cardiac Doppler and Color Doppler (Both Spectral and Color            Flow Doppler were utilized during procedure). Indications:    CHF- Acute Systolic I50.21  History:        Patient has no prior history of Echocardiogram examinations.                 CHF, CAD, Signs/Symptoms:Chest Pain; Risk Factors:Current                 Smoker.  Sonographer:    Koleen Popper RDCS Referring Phys: [id number] RATHORE  Sonographer Comments: Definity  contrast attempted- no IV access. IMPRESSIONS  1. Left ventricular ejection fraction, by estimation, is 15-20%. The left ventricle has severely decreased function. The left ventricle demonstrates global hypokinesis. The left ventricular internal cavity size was moderately dilated. Left ventricular diastolic parameters are indeterminate.  2. Right ventricular systolic function is mildly reduced. The right ventricular size is mildly enlarged. There is moderately elevated pulmonary artery systolic pressure. The estimated right ventricular systolic pressure is 54.7 mmHg.  3. Left atrial size was moderately dilated.  4. Right atrial size was mild to moderately dilated.  5. The mitral valve is grossly normal. Severe mitral valve regurgitation, systolic reversals pulmoanry veins. No evidence of mitral stenosis.  6. 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  7. The aortic valve is tricuspid. There is mild thickening of the aortic valve. Aortic valve regurgitation is trivial. No aortic stenosis is present.  8. The inferior vena cava is dilated in size with <50% respiratory variability, suggesting right atrial pressure of 15 mmHg. FINDINGS  Left Ventricle: Left ventricular ejection fraction, by estimation, is 15-20%. The left ventricle has severely decreased function. The left ventricle demonstrates global hypokinesis. The left ventricular internal cavity size was moderately dilated. There  is borderline left ventricular  hypertrophy. Left ventricular diastolic parameters are indeterminate. Right Ventricle: The right ventricular size is mildly enlarged. No increase in right ventricular wall thickness. Right ventricular systolic function is mildly reduced. There is moderately elevated pulmonary artery systolic pressure. The tricuspid regurgitant velocity  is 3.15 m/s, and with an assumed right atrial pressure of 15 mmHg, the estimated right ventricular systolic pressure is 54.7 mmHg. Left Atrium: Left atrial size was moderately dilated. Right Atrium: Right atrial size was mild to moderately dilated. Pericardium: There is no evidence of pericardial effusion. Mitral Valve: The mitral valve is grossly normal. Severe mitral valve regurgitation. No evidence of mitral valve stenosis. MV peak gradient, 5.6 mmHg. The mean mitral valve gradient is 2.0 mmHg. Tricuspid Valve: The tricuspid valve is normal in structure. Tricuspid valve regurgitation is severe. No evidence of tricuspid stenosis. Aortic Valve: The aortic valve is tricuspid. There is mild thickening of the aortic valve. Aortic valve regurgitation is trivial. No aortic stenosis is present. Pulmonic Valve: The pulmonic valve was normal in structure. Pulmonic valve regurgitation is mild. No evidence of pulmonic stenosis. Aorta: The aortic root is normal in size and structure. Venous: A pattern of systolic flow reversal, suggestive of severe mitral regurgitation is recorded from the right lower pulmonary vein. The inferior vena cava is dilated in size with less than 50% respiratory variability, suggesting right atrial pressure  of 15 mmHg. The inferior vena cava and the hepatic vein show a pattern of systolic flow reversal, suggestive of tricuspid regurgitation. IAS/Shunts: No atrial level shunt detected by color flow Doppler.  LEFT VENTRICLE PLAX 2D LVIDd:         6.11 cm LVIDs:         5.57 cm LV PW:         1.02 cm LV IVS:        0.93 cm LVOT diam:     1.72 cm LV SV:         27 LV SV  Index:   17 LVOT Area:     2.32 cm  RIGHT VENTRICLE             IVC RV S prime:     10.70 cm/s  IVC diam: 2.22 cm TAPSE (M-mode): 1.5 cm LEFT ATRIUM             Index        RIGHT ATRIUM           Index LA diam:        4.73 cm 2.95 cm/m   RA Area:     20.90 cm LA Vol (A2C):   47.4 ml 29.52 ml/m  RA Volume:   62.20 ml  38.73 ml/m LA Vol (A4C):   75.2 ml 46.83 ml/m LA Biplane Vol: 60.9 ml 37.92 ml/m  AORTIC VALVE LVOT Vmax:   73.90 cm/s LVOT Vmean:  46.400 cm/s LVOT VTI:    0.115 m  AORTA Ao Root diam: 3.03 cm Ao Asc diam:  2.89 cm MITRAL VALVE              TRICUSPID VALVE MV Area VTI:  1.30 cm    TR Peak grad:   39.7 mmHg MV Peak grad: 5.6 mmHg    TR Vmax:        315.00 cm/s MV Mean grad: 2.0 mmHg MV Vmax:      1.18 m/s    SHUNTS MV Vmean:     71.3 cm/s   Systemic VTI:  0.12 m MR Peak grad: 87.2 mmHg   Systemic Diam: 1.72 cm MR Mean grad: 49.0 mmHg MR Vmax:      467.00 cm/s MR Vmean:     324.0 cm/s Soyla Merck MD Electronically signed by Soyla Merck MD Signature Date/Time: 04/02/2024/4:32:25 PM  Final    CT Angio Chest PE W and/or Wo Contrast Result Date: 04/01/2024 EXAM: CTA CHEST 04/01/2024 08:06:00 PM TECHNIQUE: CTA of the chest was performed without and with the administration of 75 mL of intravenous iohexol  (OMNIPAQUE ) 350 MG/ML injection. Multiplanar reformatted images are provided for review. MIP images are provided for review. Automated exposure control, iterative reconstruction, and/or weight based adjustment of the mA/kV was utilized to reduce the radiation dose to as low as reasonably achievable. COMPARISON: Same-day chest radiograph and chest CT made on 01/26/2019. CLINICAL HISTORY: Pulmonary embolism (PE) suspected, low to intermediate prob, positive D-dimer. Shortness of breath FINDINGS: PULMONARY ARTERIES: Pulmonary arteries are adequately opacified for evaluation. No pulmonary embolism. Main pulmonary artery is normal in caliber. MEDIASTINUM: Cardiomegaly. Coronary artery aortic  atherosclerotic calcification. Reflux of contrast into the IVC and hepatic veins is compatible with elevated right heart pressures. There is no acute abnormality of the thoracic aorta. LYMPH NODES: No mediastinal, hilar or axillary lymphadenopathy. LUNGS AND PLEURA: Small bilateral pleural effusions. Diffuse bronchial wall thickening. Mild interlobular septal thickening in the lower lungs. Patchy ground-glass opacities in the posterior right upper lobe and both lower lobes. No pneumothorax. UPPER ABDOMEN: Limited images of the upper abdomen are unremarkable. SOFT TISSUES AND BONES: No acute bone or soft tissue abnormality. IMPRESSION: 1. No pulmonary embolism. 2. CHF and pulmonary edema. 3. Small bilateral pleural effusions. Electronically signed by: Norman Gatlin MD 04/01/2024 08:23 PM EDT RP Workstation: [id number]   DG Chest 2 View Result Date: 04/01/2024 CLINICAL DATA:  Shortness of breath EXAM: CHEST - 2 VIEW COMPARISON:  Chest x-ray performed March 13, 2024 FINDINGS: Heart is enlarged. Mild prominence of the central interstitium. No discrete lobar infiltrate. No pleural effusion or pneumothorax. Minimal basilar atelectasis is suspected. IMPRESSION: 1. Enlarged heart with minimal basilar atelectasis suspected. Electronically Signed   By: Maude Naegeli M.D.   On: 04/01/2024 13:59   DG Chest 2 View Result Date: 03/13/2024 EXAM: 2 VIEW(S) XRAY OF THE CHEST 03/13/2024 03:21:00 AM COMPARISON: 11/23/2023 CLINICAL HISTORY: SOB. Encounter for shortness of breath. FINDINGS: LUNGS AND PLEURA: Interlobular septal thickening. No focal pulmonary opacity. No pulmonary edema. No pleural effusion. No pneumothorax. HEART AND MEDIASTINUM: Cardiomegaly, unchanged. BONES AND SOFT TISSUES: No acute osseous abnormality. IMPRESSION: 1. Interlobular septal thickening, favor edema . 2. Cardiomegaly, unchanged. Electronically signed by: Norman Gatlin MD 03/13/2024 03:30 AM EDT RP Workstation: [id number]     Microbiology: Results for orders placed or performed during the hospital encounter of 03/13/24  Resp panel by RT-PCR (RSV, Flu A&B, Covid) Anterior Nasal Swab     Status: Abnormal   Collection Time: 03/13/24  2:34 AM   Specimen: Anterior Nasal Swab  Result Value Ref Range Status   SARS Coronavirus 2 by RT PCR POSITIVE (A) NEGATIVE Final   Influenza A by PCR NEGATIVE NEGATIVE Final   Influenza B by PCR NEGATIVE NEGATIVE Final    Comment: (NOTE) The Xpert Xpress SARS-CoV-2/FLU/RSV plus assay is intended as an aid in the diagnosis of influenza from Nasopharyngeal swab specimens and should not be used as a sole basis for treatment. Nasal washings and aspirates are unacceptable for Xpert Xpress SARS-CoV-2/FLU/RSV testing.  Fact Sheet for Patients: BloggerCourse.com  Fact Sheet for Healthcare Providers: SeriousBroker.it  This test is not yet approved or cleared by the [place]  FDA and has been authorized for detection and/or diagnosis of SARS-CoV-2 by FDA under an Emergency Use Authorization (EUA). This EUA will remain in effect (meaning this test can be used) for  the duration of the COVID-19 declaration under Section 564(b)(1) of the Act, 21 U.S.C. section 360bbb-3(b)(1), unless the authorization is terminated or revoked.     Resp Syncytial Virus by PCR NEGATIVE NEGATIVE Final    Comment: (NOTE) Fact Sheet for Patients: BloggerCourse.com  Fact Sheet for Healthcare Providers: SeriousBroker.it  This test is not yet approved or cleared by the [place]  FDA and has been authorized for detection and/or diagnosis of SARS-CoV-2 by FDA under an Emergency Use Authorization (EUA). This EUA will remain in effect (meaning this test can be used) for the duration of the COVID-19 declaration under Section 564(b)(1) of the Act, 21 U.S.C. section 360bbb-3(b)(1), unless the  authorization is terminated or revoked.  Performed at [facility], [address] [place], [place] [address]     Labs: CBC: Recent Labs  Lab 04/01/24 1327  WBC 9.7  NEUTROABS 6.3  HGB 14.5  HCT 46.8  MCV 75.6*  PLT 280   Basic Metabolic Panel: Recent Labs  Lab 04/01/24 1327 04/02/24 0849 04/03/24 0304 04/04/24 0224  NA 135 139 136 138  K 5.2* 4.4 4.5 4.4  CL 105 103 104 105  CO2 20* 25 22 22   GLUCOSE 89 120* 100* 105*  BUN 14 15 27* 29*  CREATININE 1.58* 1.55* 1.83* 1.72*  CALCIUM  8.5* 8.5* 8.6* 8.5*  MG  --   --  2.0 2.3   Liver Function Tests: Recent Labs  Lab 04/01/24 1327  AST 27  ALT 21  ALKPHOS 74  BILITOT 1.1  PROT 6.5  ALBUMIN 3.3*   CBG: No results for input(s): GLUCAP in the last 168 hours.  Discharge time spent: greater than 30 minutes.  Signed: Elidia Toribio Furnace, MD [facility] 04/04/2024

## 2024-04-04 NOTE — Plan of Care (Signed)

## 2024-04-04 NOTE — TOC Transition Note (Signed)
 Transition of Care [facility]) - Discharge Note   Patient Details  Name: Jesse Key MRN: [id number] Date of Birth: 1966-05-01  Transition of Care [facility]) CM/SW Contact:  [name] Phone Number: [phone] 04/04/2024, 3:29 PM   Clinical Narrative:  [facility] CSW attempted to schedule patients hospital follow up appointment but the office stated that they will contact the patient directly for scheduling.        Barriers to Discharge: Continued Medical Work up   Patient Goals and CMS Choice Patient states their goals for this hospitalization and ongoing recovery are:: wants to recover          Discharge Placement                       Discharge Plan and Services Additional resources added to the After Visit Summary for     Discharge Planning Services: CM Consult                                 Social Drivers of Health (SDOH) Interventions SDOH Screenings   Food Insecurity: Food Insecurity Present (04/02/2024)  Housing: High Risk (04/02/2024)  Transportation Needs: Unmet Transportation Needs (04/02/2024)  Utilities: At Risk (04/02/2024)  Tobacco Use: High Risk (04/01/2024)     Readmission Risk Interventions     No data to display

## 2024-04-04 NOTE — Plan of Care (Signed)
  Problem: Education: Goal: Knowledge of General Education information will improve Description: Including pain rating scale, medication(s)/side effects and non-pharmacologic comfort measures Outcome: Progressing   Problem: Clinical Measurements: Goal: Ability to maintain clinical measurements within normal limits will improve Outcome: Progressing Goal: Diagnostic test results will improve Outcome: Progressing   Problem: Coping: Goal: Level of anxiety will decrease Outcome: Progressing

## 2024-04-04 NOTE — Telephone Encounter (Signed)
 Copied from CRM #[id number]. Topic: Appointments - Scheduling Inquiry for Clinic >> Apr 04, 2024 11:09 AM Sophia H wrote: Reason for CRM: Current patient of Rosaline Bohr - needing hospital follow up. Looks like there are 2 charts - marked for merge. This chart shows no PCP other chart shows PCP as NP Edwards.   Patient is needing a hospital follow up within 2 weeks, nothing available till Nov. 3rd. Please reach out for scheduling, ty.   [phone]

## 2024-04-04 NOTE — TOC Progression Note (Signed)
 Transition of Care [facility]) - Progression Note    Patient Details  Name: TRYSON LUMLEY MRN: [id number] Date of Birth: 1966-03-20  Transition of [facility]) CM/SW Contact  [name] Phone Number: [phone] 04/04/2024, 11:11 AM  Clinical Narrative:   CSW spoke with the patients PCP office representative, Sophia H. Who stated that the office will be closed down for sometime but they will contact the patient directly to get him scheduled.   11:14 AM- [facility] CSW called and spoke with the patient over the phone who stated that he is ok with them calling him to get him scheduled for his hospital f/u appointment and would prefer not to see anyone new.   [facility] CSW/CM will continue to follow and monitor the patient for dc readiness.     Expected Discharge Plan: Home/Self Care Barriers to Discharge: Continued Medical Work up               Expected Discharge Plan and Services   Discharge Planning Services: CM Consult   Living arrangements for the past 2 months: Apartment                                       Social Drivers of Health (SDOH) Interventions SDOH Screenings   Food Insecurity: Food Insecurity Present (04/02/2024)  Housing: High Risk (04/02/2024)  Transportation Needs: Unmet Transportation Needs (04/02/2024)  Utilities: At Risk (04/02/2024)  Tobacco Use: High Risk (04/01/2024)    Readmission Risk Interventions     No data to display

## 2024-04-04 NOTE — Progress Notes (Signed)
 Discharge  Pt verbally understands discharge POC.  Work not given PIV removed dsg intact.   Tele removed.  CCMD called by Diplomatic Services operational officer.  TOC meds ready discharged to lounge.

## 2024-04-11 ENCOUNTER — Telehealth (HOSPITAL_COMMUNITY): Payer: Self-pay

## 2024-04-11 NOTE — Progress Notes (Incomplete)
 Chief Complaint:   HPI: Jesse Key is a 58 y.o. male w/ no prior routine medical care until recently.  Past medical history of tobacco and alcohol use, and chronic systolic heart failure.   Admitted 5/25 with new acute systolic heart failure. Echo showed EF < 20%, normal RV, global HK, mid-mod MR. Diuresed well with IV lasix  and he was started on GDMT. R/LHC showed non-obstructive CAD, EF 25%, well compensated filling pressures with moderate to severely reduced CO (PCW 7, CI 2.07). cMRI showed LVEF 18%, mod dilated LV with septal-lateral dyssynchrony consistent with LBBB and diffuse severe HK. Normal RV, RVEF 35%. Mild-mod MR. Concern for LBBB CM. Discharged, weight 126.5 lbs.   Patient presented to clinic 9/29 for routine follow-up and was sent to the ED to progressive HF symptoms including dyspnea at rest. Symptoms started after contracting COVID 2 weeks prior. During that time he had stopped taking some of his meds and was drinking a lot of fluid. BNP > 4500. He was admitted and diuresed by Advanced Heart Failure service. GDMT titrated.   Works at [employer]. Lives with his cousin. HF clinic has been assisting with providing bus passes for appointments.  Denies ETOH, tobacco and drug use.    ROS: All systems negative except as listed in HPI, PMH and Problem List.  SH:  Social History   Socioeconomic History   Marital status: Single    Spouse name: Not on file   Number of children: Not on file   Years of education: Not on file   Highest education level: Not on file  Occupational History   Not on file  Tobacco Use   Smoking status: Some Days    Current packs/day: 0.25    Types: Cigarettes   Smokeless tobacco: Never  Vaping Use   Vaping status: Never Used  Substance and Sexual Activity   Alcohol use: Yes    Comment: weekends only   Drug use: No   Sexual activity: Not on file  Other Topics Concern   Not on file  Social History Narrative   Not on file   Social  Drivers of Health   Financial Resource Strain: Not on file  Food Insecurity: Food Insecurity Present (04/02/2024)   Hunger Vital Sign    Worried About Running Out of Food in the Last Year: Often true    Ran Out of Food in the Last Year: Often true  Transportation Needs: Unmet Transportation Needs (04/02/2024)   PRAPARE - Administrator, Civil Service (Medical): Yes    Lack of Transportation (Non-Medical): Yes  Physical Activity: Not on file  Stress: Not on file  Social Connections: Not on file  Intimate Partner Violence: Not At Risk (04/02/2024)   Humiliation, Afraid, Rape, and Kick questionnaire    Fear of Current or Ex-Partner: No    Emotionally Abused: No    Physically Abused: No    Sexually Abused: No    FH:  Family History  Problem Relation Age of Onset   Diabetes Mother    Hypertension Mother    Diabetes Father    Hypertension Father     Past Medical History:  Diagnosis Date   Asthma    Shellfish allergy     Current Outpatient Medications  Medication Sig Dispense Refill   albuterol  (VENTOLIN  HFA) 108 (90 Base) MCG/ACT inhaler Inhale 1-2 puffs into the lungs every 6 (six) hours as needed for wheezing or shortness of breath. 18 g 1   aspirin  EC  81 MG tablet Take 1 tablet (81 mg total) by mouth daily. Swallow whole. 30 tablet 12   digoxin  (LANOXIN ) 0.125 MG tablet Take 1 tablet (0.125 mg total) by mouth daily. 30 tablet 1   empagliflozin  (JARDIANCE ) 10 MG TABS tablet Take 1 tablet (10 mg total) by mouth daily before breakfast. 30 tablet 1   furosemide  (LASIX ) 40 MG tablet Take 1 tablet (40 mg total) by mouth daily. 30 tablet 0   metoprolol  succinate (TOPROL -XL) 25 MG 24 hr tablet Take 0.5 tablets (12.5 mg total) by mouth daily. 15 tablet 1   predniSONE  (DELTASONE ) 20 MG tablet Take 2 tablets (40 mg total) by mouth daily with breakfast. 4 tablet 0   rosuvastatin  (CRESTOR ) 20 MG tablet Take 1 tablet (20 mg total) by mouth daily. 30 tablet 1    sacubitril-valsartan (ENTRESTO) 24-26 MG Take 1 tablet by mouth 2 (two) times daily. 60 tablet 0   spironolactone (ALDACTONE) 25 MG tablet Take 0.5 tablets (12.5 mg total) by mouth daily. 15 tablet 0   No current facility-administered medications for this visit.    There were no vitals filed for this visit.  PHYSICAL EXAM:  General:  Well appearing. No resp difficulty HEENT: normal Neck: supple. JVP flat. Carotids 2+ bilaterally; no bruits. No lymphadenopathy or thryomegaly appreciated. Cor: PMI normal. Regular rate & rhythm. No rubs, gallops or murmurs. Lungs: clear Abdomen: soft, nontender, nondistended. No hepatosplenomegaly. No bruits or masses. Good bowel sounds. Extremities: no cyanosis, clubbing, rash, edema Neuro: alert & orientedx3, cranial nerves grossly intact. Moves all 4 extremities w/o difficulty. Affect pleasant.   ECG:    ASSESSMENT & PLAN:  1. Acute on chronic systolic CHF: Nonischemic cardiomyopathy. Echo in 5/25 showed EF < 20%, RV nl, global HK, mid-mod MR. RHC/LHC in 5/25 showed non-obstructive CAD, well-compensated filling pressures with CI 2.07.  Cardiac MRI in 5/25 showed LV EF 18%, RV EF 35%, nonspecific RV insertion site LGE.  Echo 9/30: EF 15-20%, RV mildly reduced, RVSP 55 mmHg, severe MR, severe TR, dilated IVC.  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 He is volume overloaded on exam after stopping his meds x 2 wks.  He seems warm/well-perfused.  - Volume improved. Restart po lasix  at 40 mg daily (previously 20 mg daily) - Continue digoxin  0.125 daily. Level < 0.6. - Continue Jardiance  10 daily - Continue Toprol  XL 12.5 daily - Continue spironolactone 12.5 daily. - Start Entresto 24/26 bid - No further room for GDMT titration with AKI. - Suspect he will eventually benefit from CRT-D.  2. AKI on CKD stage 3: Creatinine 1.55>1.83>1.72.  - Likely cardiorenal 3. 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  Minimally elevated troponin  with no trend, suspect demand ischemia.  - Continue ASA, statin.  4. Left 5th toe pain: Started yesterday. Extremely tender to palpation.  - Check uric acid - Will empirically treat for acute gout flare with 40 mg prednisone  X 3 days. If recurs, may need allopurinol and colchicine. 5. SDOH: Poor health literacy. Not compliant, confused about meds - Referral placed for paramedicine - HF TOC CSW/CM has seen regarding food and housing          Follow up

## 2024-04-11 NOTE — Telephone Encounter (Signed)
 Called to confirm/remind patient of their appointment at the [facility] on 04/12/24.   Appointment:   [x] Confirmed  [] Left mess   [] No answer/No voice mail  [] VM Full/unable to leave message  [] Phone not in service  Patient reminded to bring all medications and/or complete list.  Confirmed patient has transportation. Gave directions, instructed to utilize valet parking.

## 2024-04-12 ENCOUNTER — Telehealth (HOSPITAL_COMMUNITY): Payer: Self-pay | Admitting: Emergency Medicine

## 2024-04-12 ENCOUNTER — Encounter (HOSPITAL_COMMUNITY): Payer: Self-pay

## 2024-04-12 ENCOUNTER — Ambulatory Visit (HOSPITAL_COMMUNITY)
Admission: RE | Admit: 2024-04-12 | Discharge: 2024-04-12 | Disposition: A | Discharge status: Home / Self Care | Source: Ambulatory Visit | Attending: Physician Assistant | Admitting: Physician Assistant

## 2024-04-12 VITALS — BP 120/74 | HR 98 | Ht 65.0 in | Wt 134.4 lb

## 2024-04-12 DIAGNOSIS — I447 Left bundle-branch block, unspecified: Secondary | ICD-10-CM | POA: Diagnosis not present

## 2024-04-12 DIAGNOSIS — Z79899 Other long term (current) drug therapy: Secondary | ICD-10-CM | POA: Diagnosis not present

## 2024-04-12 DIAGNOSIS — Z8616 Personal history of COVID-19: Secondary | ICD-10-CM | POA: Diagnosis not present

## 2024-04-12 DIAGNOSIS — N1831 Chronic kidney disease, stage 3a: Secondary | ICD-10-CM

## 2024-04-12 DIAGNOSIS — Z8249 Family history of ischemic heart disease and other diseases of the circulatory system: Secondary | ICD-10-CM | POA: Diagnosis not present

## 2024-04-12 DIAGNOSIS — I2510 Atherosclerotic heart disease of native coronary artery without angina pectoris: Secondary | ICD-10-CM | POA: Diagnosis not present

## 2024-04-12 DIAGNOSIS — Z7982 Long term (current) use of aspirin: Secondary | ICD-10-CM | POA: Insufficient documentation

## 2024-04-12 DIAGNOSIS — I5022 Chronic systolic (congestive) heart failure: Secondary | ICD-10-CM

## 2024-04-12 DIAGNOSIS — Z5982 Transportation insecurity: Secondary | ICD-10-CM | POA: Diagnosis not present

## 2024-04-12 DIAGNOSIS — F17210 Nicotine dependence, cigarettes, uncomplicated: Secondary | ICD-10-CM | POA: Diagnosis not present

## 2024-04-12 DIAGNOSIS — I428 Other cardiomyopathies: Secondary | ICD-10-CM | POA: Diagnosis not present

## 2024-04-12 LAB — COMPREHENSIVE METABOLIC PANEL WITH GFR
ALT: 18 U/L (ref 0–44)
AST: 23 U/L (ref 15–41)
Albumin: 3.6 g/dL (ref 3.5–5.0)
Alkaline Phosphatase: 54 U/L (ref 38–126)
Anion gap: 11 (ref 5–15)
BUN: 17 mg/dL (ref 6–20)
CO2: 24 mmol/L (ref 22–32)
Calcium: 9 mg/dL (ref 8.9–10.3)
Chloride: 104 mmol/L (ref 98–111)
Creatinine, Ser: 1.33 mg/dL — ABNORMAL HIGH (ref 0.61–1.24)
GFR, Estimated: >60 mL/min (ref >60)
Glucose, Bld: 114 mg/dL — ABNORMAL HIGH (ref 70–99)
Potassium: 4.4 mmol/L (ref 3.5–5.1)
Sodium: 139 mmol/L (ref 135–145)
Total Bilirubin: 1.6 mg/dL — ABNORMAL HIGH (ref 0.0–1.2)
Total Protein: 6.9 g/dL (ref 6.5–8.1)

## 2024-04-12 LAB — BRAIN NATRIURETIC PEPTIDE: B Natriuretic Peptide: 3217.5 pg/mL — ABNORMAL HIGH (ref 0.0–100.0)

## 2024-04-12 NOTE — Progress Notes (Signed)
  Heart and Vascular Care Navigation  04/12/2024  Jesse Key 1965/10/24 [id number]  Reason for Referral: food insecurity, transportation concerns, housing   Engaged with patient face to face for follow up visit for Heart and Vascular Care Coordination.                                                                                                   Assessment:  CSW met with pt to discuss above concerns.  Pt reports he sometimes has trouble affording food especially as he has not been able to work lately due to health.  Only makes $1,400/month when working full time so encouraged to call [employer] to apply for food stamps. Pt familiar with food pantries and utilizes when needed.  Provided with Heart and Vascular Food bag.  Pt has issues with transportation around town as it is hard for him to afford bus passes.  Unfortunately does not qualify for any transportation assistance to get to and from daily activities but is aware of and utilizes transportation through his Medicaid benefit.                                   HRT/VAS Care Coordination     Living arrangements for the past 2 months Apartment   Lives with: Relatives       Social History:                                                                             SDOH Screenings   Food Insecurity: Food Insecurity Present (04/02/2024)  Housing: High Risk (04/02/2024)  Transportation Needs: Unmet Transportation Needs (04/02/2024)  Utilities: At Risk (04/02/2024)  Financial Resource Strain: High Risk (04/12/2024)  Tobacco Use: High Risk (04/12/2024)    SDOH Interventions: Financial Resources:  Surveyor, quantity Strain Interventions: [employer] Provided Full time employment- $1,400/month  Food Insecurity:  Food Insecurity Interventions: Assist with [employer]  Housing Insecurity:  Living with lots of other people not a stable environment  Transportation:   No car- utilizes the bus but difficult to afford     Follow-up plan:    Will continue to follow and assist as needed- will follow up with patient at next visit to discuss disability.  Andriette HILARIO Leech, LCSW Clinical Social Worker [facility] Desk#: [phone] Cell#: [phone]

## 2024-04-12 NOTE — Telephone Encounter (Signed)
 Reached out to Mr. Brittle to schedule home visit for Paramedicine Program.  We set appointment for 10/14 @ 11:00.  He advised me that his address has changed to [address] and I had that changed for his profile.    Mary Sharps, EMT-Paramedic [phone] 04/12/2024

## 2024-04-12 NOTE — Progress Notes (Signed)
 ReDS Vest / Clip - 04/12/24 0852       ReDS Vest / Clip   Station Marker A    Ruler Value 29    ReDS Value Range Low volume    ReDS Actual Value 35

## 2024-04-12 NOTE — Patient Instructions (Signed)
 Medication Changes:  No Changes In Medications at this time.   Lab Work:  Labs done today, your results will be available in MyChart, we will contact you for abnormal readings.  Follow-Up in: 2-3 weeks as scheduled with APP clinic  At the [facility], you and your health needs are our priority. We have a designated team specialized in the treatment of Heart Failure. This Care Team includes your primary Heart Failure Specialized Cardiologist (physician), Advanced Practice Providers (APPs- Physician Assistants and Nurse Practitioners), and Pharmacist who all work together to provide you with the care you need, when you need it.   You may see any of the following providers on your designated Care Team at your next follow up:  Dr. Toribio Fuel Dr. Ezra Shuck Dr. Ria Commander Dr. Odis Brownie Greig Mosses, NP Caffie Shed, [place] [facility] [place], [place] Beckey Coe, NP [place] Lee, NP Tinnie Redman, PharmD   Please be sure to bring in all your medications bottles to every appointment.   Need to Contact Us :  If you have any questions or concerns before your next appointment please send us  a message through [place] or call our office at [phone].    TO LEAVE A MESSAGE FOR THE NURSE SELECT OPTION 2, PLEASE LEAVE A MESSAGE INCLUDING: YOUR NAME DATE OF BIRTH CALL BACK NUMBER REASON FOR CALL**this is important as we prioritize the call backs  YOU WILL RECEIVE A CALL BACK THE SAME DAY AS LONG AS YOU CALL BEFORE 4:00 PM

## 2024-04-15 ENCOUNTER — Ambulatory Visit: Payer: Self-pay | Admitting: Physician Assistant

## 2024-04-17 ENCOUNTER — Other Ambulatory Visit (HOSPITAL_COMMUNITY): Payer: Self-pay

## 2024-04-17 ENCOUNTER — Other Ambulatory Visit (HOSPITAL_COMMUNITY): Payer: Self-pay | Admitting: Emergency Medicine

## 2024-04-17 DIAGNOSIS — I5022 Chronic systolic (congestive) heart failure: Secondary | ICD-10-CM

## 2024-04-17 MED ORDER — DIGOXIN 125 MCG PO TABS
0.1250 mg | ORAL_TABLET | Freq: Every day | ORAL | 1 refills | Status: DC
  Filled 2024-04-17: qty 30, 30d supply, fill #0
  Filled 2024-05-16: qty 30, 30d supply, fill #1

## 2024-04-17 MED ORDER — FUROSEMIDE 40 MG PO TABS
40.0000 mg | ORAL_TABLET | Freq: Every day | ORAL | 0 refills | Status: DC
  Filled 2024-04-17 – 2024-05-03 (×3): qty 30, 30d supply, fill #0

## 2024-04-17 MED ORDER — SPIRONOLACTONE 25 MG PO TABS
12.5000 mg | ORAL_TABLET | Freq: Every day | ORAL | 0 refills | Status: DC
  Filled 2024-04-17 – 2024-04-26 (×2): qty 15, 30d supply, fill #0

## 2024-04-17 NOTE — Progress Notes (Unsigned)
 SOCIAL/MEDICAL BARRIERS:  PHARMACY USED [facility]   MED ISSUES:  AFFORDABILITY NO  PT ASSIST APPS NEEDED NONE  PCP NONE   Surveyor, mining, Medicaid Healthy Blue  SOURCE OF INCOME [employer]  TRANSPORTATION uses public transportation  FOOD INSECURITIES/NEEDS NONE FOOD STAMPS NONE  REVIEWED DIET/FLUID/SALT RESTRICTIONS YES  RENT/OWN HOME ISSUES RENT-lives w/ cousin  SOCIAL SUPPORT - family  SAFETY/DOMESTIC ISSUES NONE  SUBSTANCE ABUSE NONE (stopped in 2001)  DAILY WEIGHTS YES  EDUCATE ON DISEASE PROCESS/SYMPTOMS/PURPOSES OF MEDS YES  Paramedicine Encounter    Patient ID: Jesse Key, male    DOB: 29-May-1966, 58 y.o.   MRN: [id number]   Complaints NONE  Assessment A&O x 4, skin W&D w/ good color.  Denies chest pain or SOB.  Lung sounds clear and equal bilat.  No peripheral edema noted  Compliance with meds He is taking what he has on hand.  Needs several refills which I'll call in.  Inadvertently to a 40mg  and 20 Mg. Furosemide  because he was taking out of each bottle.  Pill box filled x 1 week  Refills needed ASA, Furosemide , Jardiance , Digoxin , Rosuvastatin , Spironolactone, Metoprolol   Meds changes since last visit     Social changes Living w/ cousin   VISIT SUMMARY**  BP 108/70 (BP Location: Right Arm)   Pulse 94   Wt 134 lb (60.8 kg)   SpO2 96%   BMI 22.30 kg/m  Weight yesterday-134lb Last visit weight- 134lb  Dig, Furosemide  and Spiro from [facility] need sent to [facility] [phone]   ACTION: {Paramed Action:[id number]}     Patient Care Team: Patient, No Pcp Per as PCP - General (General Practice)  Patient Active Problem List   Diagnosis Date Noted  . Acute gout 04/04/2024  . Chest pain 04/02/2024  . CKD stage 3b, GFR 30-44 ml/min (HCC) - baseline scr 1.3-1.5 04/02/2024  . Hyperkalemia 04/02/2024  . CAD (coronary artery disease) 04/02/2024  . Acute on chronic systolic CHF (congestive heart  failure) (HCC) 04/02/2024    Current Outpatient Medications:  .  empagliflozin  (JARDIANCE ) 10 MG TABS tablet, Take 1 tablet (10 mg total) by mouth daily before breakfast., Disp: 30 tablet, Rfl: 1 .  furosemide  (LASIX ) 40 MG tablet, Take 1 tablet (40 mg total) by mouth daily., Disp: 30 tablet, Rfl: 0 .  metoprolol  succinate (TOPROL -XL) 25 MG 24 hr tablet, Take 0.5 tablets (12.5 mg total) by mouth daily., Disp: 15 tablet, Rfl: 1 .  rosuvastatin  (CRESTOR ) 20 MG tablet, Take 1 tablet (20 mg total) by mouth daily., Disp: 30 tablet, Rfl: 1 .  sacubitril-valsartan (ENTRESTO) 24-26 MG, Take 1 tablet by mouth 2 (two) times daily., Disp: 60 tablet, Rfl: 0 .  spironolactone (ALDACTONE) 25 MG tablet, Take 0.5 tablets (12.5 mg total) by mouth daily., Disp: 15 tablet, Rfl: 0 .  albuterol  (VENTOLIN  HFA) 108 (90 Base) MCG/ACT inhaler, Inhale 1-2 puffs into the lungs every 6 (six) hours as needed for wheezing or shortness of breath. (Patient not taking: Reported on 04/17/2024), Disp: 18 g, Rfl: 1 .  aspirin  EC 81 MG tablet, Take 1 tablet (81 mg total) by mouth daily. Swallow whole. (Patient not taking: Reported on 04/17/2024), Disp: 30 tablet, Rfl: 12 .  digoxin  (LANOXIN ) 0.125 MG tablet, Take 1 tablet (0.125 mg total) by mouth daily. (Patient not taking: Reported on 04/17/2024), Disp: 30 tablet, Rfl: 1 .  predniSONE  (DELTASONE ) 20 MG tablet, Take 2 tablets (40 mg total) by mouth daily with breakfast. (Patient not taking: Reported on  04/17/2024), Disp: 4 tablet, Rfl: 0 Allergies  Allergen Reactions  . Shrimp [Shellfish Allergy] Anaphylaxis and Itching    ALL SHELLFISH     Social History   Socioeconomic History  . Marital status: Single    Spouse name: Not on file  . Number of children: Not on file  . Years of education: Not on file  . Highest education level: Not on file  Occupational History  . Not on file  Tobacco Use  . Smoking status: Some Days    Current packs/day: 0.25    Types: Cigarettes  .  Smokeless tobacco: Never  Vaping Use  . Vaping status: Never Used  Substance and Sexual Activity  . Alcohol use: Yes    Comment: weekends only  . Drug use: No  . Sexual activity: Not on file  Other Topics Concern  . Not on file  Social History Narrative  . Not on file   Social Drivers of Health   Financial Resource Strain: High Risk (04/12/2024)   Overall Financial Resource Strain (CARDIA)   . Difficulty of Paying Living Expenses: Hard  Food Insecurity: Food Insecurity Present (04/02/2024)   Hunger Vital Sign   . Worried About Programme researcher, broadcasting/film/video in the Last Year: Often true   . Ran Out of Food in the Last Year: Often true  Transportation Needs: Unmet Transportation Needs (04/02/2024)   PRAPARE - Transportation   . Lack of Transportation (Medical): Yes   . Lack of Transportation (Non-Medical): Yes  Physical Activity: Not on file  Stress: Not on file  Social Connections: Not on file  Intimate Partner Violence: Not At Risk (04/02/2024)   Humiliation, Afraid, Rape, and Kick questionnaire   . Fear of Current or Ex-Partner: No   . Emotionally Abused: No   . Physically Abused: No   . Sexually Abused: No    Physical Exam      Future Appointments  Date Time Provider Department Center  05/03/2024 10:30 AM [facility] PA/NP [facility] None

## 2024-04-18 ENCOUNTER — Other Ambulatory Visit: Payer: Self-pay

## 2024-04-18 ENCOUNTER — Encounter (HOSPITAL_COMMUNITY): Payer: Self-pay

## 2024-04-19 ENCOUNTER — Other Ambulatory Visit (HOSPITAL_COMMUNITY): Payer: Self-pay

## 2024-04-19 ENCOUNTER — Other Ambulatory Visit: Payer: Self-pay

## 2024-04-19 ENCOUNTER — Telehealth (HOSPITAL_COMMUNITY): Payer: Self-pay

## 2024-04-19 ENCOUNTER — Other Ambulatory Visit (HOSPITAL_COMMUNITY): Payer: Self-pay | Admitting: Emergency Medicine

## 2024-04-19 NOTE — Telephone Encounter (Signed)
 Patient had 2 charts,  once merged patients medication list with multiple duplicates. This has been updated to reflect most recent OV note from 10/10 and confirmed with paramedicine.

## 2024-04-19 NOTE — Progress Notes (Signed)
 Delivered meds from pharmacy in order to complete med reconciliation from [facility] visit. Metoprolol , Digoxin , Rosuvastatin , Jardiance , ASA  Completed pill box for 1 week.    Mary Sharps, EMT-Paramedic [phone] 04/19/2024

## 2024-04-26 ENCOUNTER — Other Ambulatory Visit (HOSPITAL_COMMUNITY): Payer: Self-pay

## 2024-04-26 ENCOUNTER — Other Ambulatory Visit (HOSPITAL_COMMUNITY): Payer: Self-pay | Admitting: Emergency Medicine

## 2024-04-29 ENCOUNTER — Other Ambulatory Visit: Payer: Self-pay | Admitting: Licensed Clinical Social Worker

## 2024-04-29 NOTE — Patient Instructions (Signed)
 Visit Information  Thank you for taking time to visit with me today. Please don't hesitate to contact me if I can be of assistance to you before our next scheduled appointment.  Our next appointment is by telephone on 06/04/24 at 10:00 AM   Please call the care guide team at [phone] if you need to cancel or reschedule your appointment.   Following is a copy of your care plan:   Goals Addressed             This Visit's Progress    VBCI Social Work Care Plan       Problems:   Transport challenges; uses local [employer] bus system             Financial challenges              Pain issues             Decreased family support. He does have siblings who he talks to on the phone but both siblings live out of state  CSW Clinical Goal(s):   Over the next 30 days the Patient will attend all scheduled medical appointments as evidenced by patient report and care team review of appointment completion in electronic medical record.            Over next 30 days, patient will communicate as needed with PCP office (NP Rosaline Bohr) to discuss breathing issues of client and discuss medical needs of client AEB patient report of communication with PCP office  Interventions:  Discussed client housing needs and status.  He said he has now found temporary housing. He is residing now at home of his cousin in [place], [place]             Discussed program support with client             Discussed client support with NP Rosaline Bohr             Discussed client support with cardiologist. Client said he has appointment on 05/03/24 with cardiologist.              Discussed pain issues of client.  Discussed vision challenges. He said he has decreased vision in right eye             Discussed transport needs. Discussed client use of Medicaid [employer] for some of his transport needs. He said he uses [employer] also occasionally to help  with transport needs.             Discussed energy level. He said he  sometimes is fatigued. He sometimes gets short of breath                Provided counseling support for client.                Discussed client mood. He sometimes he feels that his mood has improved since he is residing at home of his cousin            Discussed food scarcity. Encouraged client to use local food pantries. He said he goes to [employer] regularly for food support             Encouraged client to call LCSW as needed for SW support at [phone].               Client was appreciative of call from LCSW today  Patient Goals/Self-Care Activities:  Attend scheduled appointments with NP and with cardiologist  Take medications as prescribed             Work job as scheduled at [employer].              Stay in communication with his siblings             Continue current care plan             Go to local food pantry as needed for food support             Call LCSW as needed for SW support  Plan:   Telephone follow up appointment with care management team member scheduled for:  06/04/24 at 10:00 AM        Please go to [facility] [address], [place] [phone]) if you are experiencing a Mental Health or Behavioral Health Crisis or need someone to talk to.  Patient verbalized understanding of Care plan and visit instructions communicated this visit   Glendia Pear  MSW, LCSW [place]/Value Based Care [facility] Licensed Clinical Social Worker Direct Dial:  [phone] Fax:  [phone] Website:  [email or web address]

## 2024-04-29 NOTE — Patient Outreach (Signed)
 Complex Care Management   Visit Note  04/29/2024  Name:  Jesse Key MRN: [id number] DOB: 1966-01-12  Situation: Referral received for Complex Care Management related to SDOH needs; financial needs; stress issues I obtained verbal consent from Patient.  Visit completed with Patient  on the phone  Background:   Past Medical History:  Diagnosis Date   Asthma    Shellfish allergy     Assessment: Patient Reported Symptoms:  Cognitive Cognitive Status: Alert and oriented to person, place, and time, Difficulties with attention and concentration Cognitive/Intellectual Conditions Management [RPT]: None reported or documented in medical history or problem list   Health Maintenance Behaviors: Stress management  Neurological Neurological Review of Symptoms: Weakness Neurological Management Strategies: Adequate rest  HEENT HEENT Symptoms Reported: Sudden change or loss of vision HEENT Management Strategies: Adequate rest    Cardiovascular Cardiovascular Symptoms Reported: Fatigue Cardiovascular Management Strategies: Coping strategies  Respiratory Respiratory Symptoms Reported: Shortness of breath Respiratory Management Strategies: Coping strategies  Endocrine Endocrine Symptoms Reported: Weakness or fatigue, Shakiness, Blurry vision, Shortness of breath    Gastrointestinal Gastrointestinal Symptoms Reported: No symptoms reported Gastrointestinal Management Strategies: Coping strategies    Genitourinary Genitourinary Symptoms Reported: Frequency Additional Genitourinary Details: takes Lasix  as prescribed Genitourinary Management Strategies: Adequate rest  Integumentary Integumentary Symptoms Reported: No symptoms reported Skin Management Strategies: Adequate rest  Musculoskeletal Musculoskelatal Symptoms Reviewed: Muscle pain, Joint pain Musculoskeletal Management Strategies: Coping strategies      Psychosocial Psychosocial Symptoms Reported: Sadness - if selected complete PHQ  2-9, Depression - if selected complete PHQ 2-9 Behavioral Management Strategies: Coping strategies Major Change/Loss/Stressor/Fears (CP): Medical condition, self Techniques to Cope with Loss/Stress/Change: Counseling      04/29/2024    PHQ2-9 Depression Screening   Little interest or pleasure in doing things Several days  Feeling down, depressed, or hopeless Several days  PHQ-2 - Total Score 2  Trouble falling or staying asleep, or sleeping too much Several days  Feeling tired or having little energy Several days  Poor appetite or overeating  Several days  Feeling bad about yourself - or that you are a failure or have let yourself or your family down Several days  Trouble concentrating on things, such as reading the newspaper or watching television Several days  Moving or speaking so slowly that other people could have noticed.  Or the opposite - being so fidgety or restless that you have been moving around a lot more than usual Several days  Thoughts that you would be better off dead, or hurting yourself in some way Not at all  PHQ2-9 Total Score 8  If you checked off any problems, how difficult have these problems made it for you to do your work, take care of things at home, or get along with other people Somewhat difficult  Depression Interventions/Treatment Counseling    Vitals:   BP within normal range, per client information  Medications Reviewed Today     Reviewed by Frances Ozell GORMAN KEN (Social Worker) on 04/29/24 at 1117  Med List Status: <None>   Medication Order Taking? Sig Documenting Provider Last Dose Status Informant  albuterol  (VENTOLIN  HFA) 108 (90 Base) MCG/ACT inhaler [id number] Not taking  Inhale 1-2 puffs into the lungs every 6 (six) hours as needed for wheezing or shortness of breath.  Patient not taking: Reported on 04/29/2024   Smoot, Lauraine LABOR, PA-C  Active Self, Pharmacy Records, Other             albuterol  (VENTOLIN  HFA) 108 (  90 Base) MCG/ACT inhaler  [id number] Not taking  Inhale 2 puffs into the lungs every 6 (six) hours as needed for wheezing or shortness of breath.  Patient not taking: Reported on 04/29/2024   Toma Matas, MD  Active   aspirin  EC 81 MG tablet [id number] Yes Take 1 tablet (81 mg total) by mouth daily. Swallow whole. Smoot, Lauraine LABOR, PA-C  Active Self, Pharmacy Records, Other  aspirin  EC 81 MG tablet [id number] unknown Take 1 tablet (81 mg total) by mouth daily. Swallow whole. [employer], [place], FNP  Active   clotrimazole-betamethasone (LOTRISONE) cream [id number] Yes Apply 1 Application topically 2 (two) times daily. Celestia Rosaline SQUIBB, NP  Active   cyclobenzaprine  (FLEXERIL ) 10 MG tablet [id number] Yes Take 10 mg by mouth 2 (two) times daily as needed. [provider]  Active Self, Pharmacy Records  digoxin  (LANOXIN ) 0.125 MG tablet [id number] Yes Take 1 tablet (0.125 mg total) by mouth daily. Colletta Manuelita Garre, PA-C  Active   empagliflozin  (JARDIANCE ) 10 MG TABS tablet [id number] Yes Take 1 tablet (10 mg total) by mouth daily before breakfast. Smoot, Lauraine LABOR, PA-C  Active Self, Pharmacy Records, Other  furosemide  (LASIX ) 40 MG tablet [id number] Yes Take 1 tablet (40 mg total) by mouth daily. Colletta Manuelita Garre, PA-C  Active   hydrOXYzine (ATARAX) 10 MG tablet [id number] Not taking  Take 1 tablet (10 mg total) by mouth 3 (three) times daily as needed.  Patient not taking: Reported on 04/29/2024   Celestia Rosaline SQUIBB, NP  Active   metoprolol  succinate (TOPROL -XL) 25 MG 24 hr tablet [id number] Yes Take 0.5 tablets (12.5 mg total) by mouth daily. Smoot, Lauraine LABOR, PA-C  Active Self, Pharmacy Records, Other  predniSONE  (DELTASONE ) 20 MG tablet [id number] Not taking  Take 2 tablets (40 mg total) by mouth daily with breakfast.  Patient not taking: Reported on 04/29/2024   Arrien, Mauricio Daniel, MD  Active   rosuvastatin  (CRESTOR ) 20 MG tablet [id number] Yes Take 1 tablet (20 mg total) by mouth daily.  Patient taking  differently: Take 20 mg by mouth daily. Taking in pm   Smoot, Lauraine LABOR, PA-C  Active Self, Pharmacy Records, Other  sacubitril-valsartan (ENTRESTO) 24-26 [place] [id number] Yes Take 1 tablet by mouth 2 (two) times daily. Arrien, Mauricio Daniel, MD  Active   spironolactone (ALDACTONE) 25 MG tablet [id number] Yes Take 1/2 tablets (12.5 mg total) by mouth daily. Colletta Manuelita Garre, PA-C  Active             Recommendation:   PCP Follow-up Continue Current Plan of Care Go to local food pantry ([facility]) as needed for food support Call LCSW as needed for SW support Attend scheduled appointments with cardiologist  Follow Up Plan:   Telephone follow up appointment date/time:  06/04/24 at 10:00 AM    Glendia Pear  MSW, LCSW [place] Licensed Clinical Social Worker Direct Dial:  [phone] Fax:  [phone] Website:  [email or web address]

## 2024-04-30 NOTE — Progress Notes (Signed)
 Paramedicine Encounter    Patient ID: Jesse Key, male    DOB: [birth year], 58 y.o.   MRN: [id number]   Complaints NONE  Assessment A&O x 4, skin W&D w/ good color.  Denies chest pain or SOB.  Lung sounds clear and equal bilat. No peripheral edema  Compliance with meds YES  Pill box filled x 1 week  Refills needed NONE  Meds changes since last visit NONE    Social changes NONE   BP 118/80 (BP Location: Right Arm, Patient Position: Sitting, Cuff Size: Normal)   Pulse 88   Resp 16   Wt 136 lb (61.7 kg)   SpO2 98%   BMI 22.63 kg/m  Weight yesterday- Last visit weight-134lb  ACTION: Home visit completed  Mary Claudene Kennel [phone] 04/26/24  Patient Care Team: Celestia Rosaline SQUIBB, NP as PCP - General (Internal Medicine) Bensimhon, Toribio SAUNDERS, MD as PCP - Advanced Heart Failure (Cardiology) Patient, No Pcp Per (General Practice) Frances Ozell RAMAN, LCSW as VBCI Care Management (Licensed Clinical Social Worker)  Patient Active Problem List   Diagnosis Date Noted   Acute gout 04/04/2024   Chest pain 04/02/2024   CKD stage 3b, GFR 30-44 ml/min (HCC) - baseline scr 1.3-1.5 04/02/2024   Hyperkalemia 04/02/2024   CAD (coronary artery disease) 04/02/2024   Acute on chronic systolic CHF (congestive heart failure) (HCC) 04/02/2024   Shortness of breath 11/25/2023   Primary hypertension 11/24/2023   Heart failure (HCC) 11/23/2023   ETOH abuse 11/23/2023   Elevated troponin 11/23/2023   Leukocytosis 11/23/2023   Acute pulmonary edema (HCC) 11/23/2023    Current Outpatient Medications:    albuterol  (VENTOLIN  HFA) 108 (90 Base) MCG/ACT inhaler, Inhale 1-2 puffs into the lungs every 6 (six) hours as needed for wheezing or shortness of breath. (Patient not taking: Reported on 04/29/2024), Disp: 18 g, Rfl: 1   albuterol  (VENTOLIN  HFA) 108 (90 Base) MCG/ACT inhaler, Inhale 2 puffs into the lungs every 6 (six) hours as needed for wheezing or shortness of breath.  (Patient not taking: Reported on 04/29/2024), Disp: 6.7 g, Rfl: 0   aspirin  EC 81 MG tablet, Take 1 tablet (81 mg total) by mouth daily. Swallow whole., Disp: 30 tablet, Rfl: 12   aspirin  EC 81 MG tablet, Take 1 tablet (81 mg total) by mouth daily. Swallow whole., Disp: 30 tablet, Rfl: 12   clotrimazole-betamethasone (LOTRISONE) cream, Apply 1 Application topically 2 (two) times daily., Disp: 45 g, Rfl: 1   cyclobenzaprine  (FLEXERIL ) 10 MG tablet, Take 10 mg by mouth 2 (two) times daily as needed., Disp: , Rfl:    digoxin  (LANOXIN ) 0.125 MG tablet, Take 1 tablet (0.125 mg total) by mouth daily., Disp: 30 tablet, Rfl: 1   empagliflozin  (JARDIANCE ) 10 MG TABS tablet, Take 1 tablet (10 mg total) by mouth daily before breakfast., Disp: 30 tablet, Rfl: 1   furosemide  (LASIX ) 40 MG tablet, Take 1 tablet (40 mg total) by mouth daily., Disp: 30 tablet, Rfl: 0   hydrOXYzine (ATARAX) 10 MG tablet, Take 1 tablet (10 mg total) by mouth 3 (three) times daily as needed. (Patient not taking: Reported on 04/29/2024), Disp: 30 tablet, Rfl: 0   metoprolol  succinate (TOPROL -XL) 25 MG 24 hr tablet, Take 0.5 tablets (12.5 mg total) by mouth daily., Disp: 15 tablet, Rfl: 1   predniSONE  (DELTASONE ) 20 MG tablet, Take 2 tablets (40 mg total) by mouth daily with breakfast. (Patient not taking: Reported on 04/29/2024), Disp: 4 tablet, Rfl: 0   rosuvastatin  (  CRESTOR ) 20 MG tablet, Take 1 tablet (20 mg total) by mouth daily. (Patient taking differently: Take 20 mg by mouth daily. Taking in pm), Disp: 30 tablet, Rfl: 1   sacubitril-valsartan (ENTRESTO) 24-26 MG, Take 1 tablet by mouth 2 (two) times daily., Disp: 60 tablet, Rfl: 0   spironolactone (ALDACTONE) 25 MG tablet, Take 1/2 tablets (12.5 mg total) by mouth daily., Disp: 15 tablet, Rfl: 0 Allergies  Allergen Reactions   Shrimp [Shellfish Allergy] Anaphylaxis and Itching    ALL SHELLFISH   Shellfish Allergy Itching     Social History   Socioeconomic History    Marital status: Single    Spouse name: Not on file   Number of children: Not on file   Years of education: Not on file   Highest education level: Not on file  Occupational History   Not on file  Tobacco Use   Smoking status: Some Days    Current packs/day: 0.25    Types: Cigarettes   Smokeless tobacco: Never  Vaping Use   Vaping status: Never Used  Substance and Sexual Activity   Alcohol use: Yes    Comment: weekends only   Drug use: No   Sexual activity: Not on file  Other Topics Concern   Not on file  Social History Narrative   ** Merged History Encounter **       Homeless at the time may smoke one cigarette a day   Social Drivers of Corporate Investment Banker Strain: High Risk (04/12/2024)   Overall Financial Resource Strain (CARDIA)    Difficulty of Paying Living Expenses: Hard  Food Insecurity: Food Insecurity Present (04/29/2024)   Hunger Vital Sign    Worried About Running Out of Food in the Last Year: Sometimes true    Ran Out of Food in the Last Year: Sometimes true  Transportation Needs: Unmet Transportation Needs (04/29/2024)   PRAPARE - Administrator, Civil Service (Medical): Yes    Lack of Transportation (Non-Medical): Yes  Physical Activity: Not on file  Stress: Stress Concern Present (03/20/2024)   [employer] of Occupational Health - Occupational Stress Questionnaire    Feeling of Stress: To some extent  Social Connections: Not on file  Intimate Partner Violence: Not At Risk (04/02/2024)   Humiliation, Afraid, Rape, and Kick questionnaire    Fear of Current or Ex-Partner: No    Emotionally Abused: No    Physically Abused: No    Sexually Abused: No    Physical Exam      Future Appointments  Date Time Provider Department Center  05/13/2024  3:30 PM [facility] PA/NP [facility] None  06/04/2024 10:45 AM Forrest, Ozell RAMAN, LCSW [facility] None

## 2024-05-03 ENCOUNTER — Encounter (HOSPITAL_COMMUNITY)

## 2024-05-03 ENCOUNTER — Other Ambulatory Visit (HOSPITAL_COMMUNITY): Payer: Self-pay | Admitting: Physician Assistant

## 2024-05-03 ENCOUNTER — Other Ambulatory Visit (HOSPITAL_COMMUNITY): Payer: Self-pay | Admitting: Emergency Medicine

## 2024-05-03 ENCOUNTER — Other Ambulatory Visit (HOSPITAL_COMMUNITY): Payer: Self-pay

## 2024-05-03 ENCOUNTER — Other Ambulatory Visit: Payer: Self-pay

## 2024-05-03 MED ORDER — SACUBITRIL-VALSARTAN 24-26 MG PO TABS
1.0000 | ORAL_TABLET | Freq: Two times a day (BID) | ORAL | 0 refills | Status: DC
  Filled 2024-05-03: qty 60, 30d supply, fill #0

## 2024-05-03 MED ORDER — SPIRONOLACTONE 25 MG PO TABS
12.5000 mg | ORAL_TABLET | Freq: Every day | ORAL | 1 refills | Status: DC
  Filled 2024-05-03: qty 30, 60d supply, fill #0

## 2024-05-03 NOTE — Progress Notes (Signed)
 Paramedicine Encounter    Patient ID: Jesse Key, male    DOB: Nov 25, 1965, 58 y.o.   MRN: [id number]   Complaints NONE  Assessment A&O x 4, skin W&D w/ good color.  Pt w/ odor of ETOH present and admits to drinking earlier this morning.  He states today is the anniversary of his mother's death and it always brings him down.  Reminded him that he needs to watch his fluid intake and abstain from drinking alcohol if possible. He denies chest pain or SOB.  Lung sounds clear throughout . No edema noted.   Compliance with meds YES  Pill box filled x 1 week  Refills needed Spiro, Furosemide  and Entresto  Meds changes since last visit NONE    Social changes NONE   BP 108/70 (BP Location: Left Arm, Patient Position: Sitting, Cuff Size: Normal)   Pulse 83   Resp 16   Wt 135 lb (61.2 kg)   SpO2 97%   BMI 22.47 kg/m  Weight yesterday- Last visit weight-135lb  ACTION: Home visit completed  Mary Claudene Kennel [phone] 05/03/24  Patient Care Team: Celestia Rosaline SQUIBB, NP as PCP - General (Internal Medicine) Bensimhon, Toribio SAUNDERS, MD as PCP - Advanced Heart Failure (Cardiology) Patient, No Pcp Per (General Practice) Frances Ozell RAMAN, LCSW as VBCI Care Management (Licensed Clinical Social Worker)  Patient Active Problem List   Diagnosis Date Noted   Acute gout 04/04/2024   Chest pain 04/02/2024   CKD stage 3b, GFR 30-44 ml/min (HCC) - baseline scr 1.3-1.5 04/02/2024   Hyperkalemia 04/02/2024   CAD (coronary artery disease) 04/02/2024   Acute on chronic systolic CHF (congestive heart failure) (HCC) 04/02/2024   Shortness of breath 11/25/2023   Primary hypertension 11/24/2023   Heart failure (HCC) 11/23/2023   ETOH abuse 11/23/2023   Elevated troponin 11/23/2023   Leukocytosis 11/23/2023   Acute pulmonary edema (HCC) 11/23/2023    Current Outpatient Medications:    aspirin  EC 81 MG tablet, Take 1 tablet (81 mg total) by mouth daily. Swallow whole., Disp: 30  tablet, Rfl: 12   clotrimazole-betamethasone (LOTRISONE) cream, Apply 1 Application topically 2 (two) times daily., Disp: 45 g, Rfl: 1   cyclobenzaprine  (FLEXERIL ) 10 MG tablet, Take 10 mg by mouth 2 (two) times daily as needed., Disp: , Rfl:    digoxin  (LANOXIN ) 0.125 MG tablet, Take 1 tablet (0.125 mg total) by mouth daily., Disp: 30 tablet, Rfl: 1   empagliflozin  (JARDIANCE ) 10 MG TABS tablet, Take 1 tablet (10 mg total) by mouth daily before breakfast., Disp: 30 tablet, Rfl: 1   furosemide  (LASIX ) 40 MG tablet, Take 1 tablet (40 mg total) by mouth daily., Disp: 30 tablet, Rfl: 0   metoprolol  succinate (TOPROL -XL) 25 MG 24 hr tablet, Take 0.5 tablets (12.5 mg total) by mouth daily., Disp: 15 tablet, Rfl: 1   rosuvastatin  (CRESTOR ) 20 MG tablet, Take 1 tablet (20 mg total) by mouth daily., Disp: 30 tablet, Rfl: 1   sacubitril-valsartan (ENTRESTO) 24-26 MG, Take 1 tablet by mouth 2 (two) times daily., Disp: 60 tablet, Rfl: 0   albuterol  (VENTOLIN  HFA) 108 (90 Base) MCG/ACT inhaler, Inhale 1-2 puffs into the lungs every 6 (six) hours as needed for wheezing or shortness of breath. (Patient not taking: Reported on 04/29/2024), Disp: 18 g, Rfl: 1   albuterol  (VENTOLIN  HFA) 108 (90 Base) MCG/ACT inhaler, Inhale 2 puffs into the lungs every 6 (six) hours as needed for wheezing or shortness of breath. (Patient not taking: Reported on  04/29/2024), Disp: 6.7 g, Rfl: 0   aspirin  EC 81 MG tablet, Take 1 tablet (81 mg total) by mouth daily. Swallow whole., Disp: 30 tablet, Rfl: 12   hydrOXYzine (ATARAX) 10 MG tablet, Take 1 tablet (10 mg total) by mouth 3 (three) times daily as needed. (Patient not taking: Reported on 05/03/2024), Disp: 30 tablet, Rfl: 0   predniSONE  (DELTASONE ) 20 MG tablet, Take 2 tablets (40 mg total) by mouth daily with breakfast. (Patient not taking: Reported on 05/03/2024), Disp: 4 tablet, Rfl: 0   spironolactone (ALDACTONE) 25 MG tablet, Take 1/2 tablets (12.5 mg total) by mouth daily.,  Disp: 30 tablet, Rfl: 1 Allergies  Allergen Reactions   Shrimp [Shellfish Allergy] Anaphylaxis and Itching    ALL SHELLFISH   Shellfish Allergy Itching     Social History   Socioeconomic History   Marital status: Single    Spouse name: Not on file   Number of children: Not on file   Years of education: Not on file   Highest education level: Not on file  Occupational History   Not on file  Tobacco Use   Smoking status: Some Days    Current packs/day: 0.25    Types: Cigarettes   Smokeless tobacco: Never  Vaping Use   Vaping status: Never Used  Substance and Sexual Activity   Alcohol use: Yes    Comment: weekends only   Drug use: No   Sexual activity: Not on file  Other Topics Concern   Not on file  Social History Narrative   ** Merged History Encounter **       Homeless at the time may smoke one cigarette a day   Social Drivers of Corporate Investment Banker Strain: High Risk (04/12/2024)   Overall Financial Resource Strain (CARDIA)    Difficulty of Paying Living Expenses: Hard  Food Insecurity: Food Insecurity Present (04/29/2024)   Hunger Vital Sign    Worried About Running Out of Food in the Last Year: Sometimes true    Ran Out of Food in the Last Year: Sometimes true  Transportation Needs: Unmet Transportation Needs (04/29/2024)   PRAPARE - Administrator, Civil Service (Medical): Yes    Lack of Transportation (Non-Medical): Yes  Physical Activity: Not on file  Stress: Stress Concern Present (03/20/2024)   [employer] of Occupational Health - Occupational Stress Questionnaire    Feeling of Stress: To some extent  Social Connections: Not on file  Intimate Partner Violence: Not At Risk (04/02/2024)   Humiliation, Afraid, Rape, and Kick questionnaire    Fear of Current or Ex-Partner: No    Emotionally Abused: No    Physically Abused: No    Sexually Abused: No    Physical Exam      Future Appointments  Date Time Provider Department  Center  05/13/2024  3:30 PM [facility] PA/NP [facility] None  06/04/2024 10:45 AM Forrest, Ozell RAMAN, LCSW [facility] None

## 2024-05-07 ENCOUNTER — Telehealth (HOSPITAL_COMMUNITY): Payer: Self-pay | Admitting: Emergency Medicine

## 2024-05-07 NOTE — Telephone Encounter (Signed)
 LVM regarding refills I picked up for him @ the pharmacy.  Would like to do a visit today or tomorrow. Asked for call back.    Mary Sharps, EMT-Paramedic [phone] 05/07/2024

## 2024-05-08 ENCOUNTER — Other Ambulatory Visit (HOSPITAL_COMMUNITY): Payer: Self-pay | Admitting: Emergency Medicine

## 2024-05-08 NOTE — Progress Notes (Signed)
 Paramedicine Encounter    Patient ID: Jesse Key, male    DOB: [birth year], 58 y.o.   MRN: [id number]   Complaints NONE  Assessment A&O x 4, skin W&D w/ good color.  Denies chest pain or SOB.  Lung sounds clear and equal bilat. No peripheral edema noted.    Compliance with meds YES  Pill box filled x 1 week  Refills needed Digoxin   Meds changes since last visit NONE    Social changes: Family he's been staying with put him out of the apartment.  He has another aunt that is trying to help him with another place to stay.   BP 120/82 (BP Location: Left Arm, Patient Position: Standing, Cuff Size: Normal)   Pulse 94   SpO2 96%  Weight yesterday- Last visit weight-135lb  Called pt prior to arrival to make sure he was home from work.  He states, I've been put out of the apartment and trying to find another place to stay.  I asked if I could meet him somewhere and he advised he was still at the apartment just sitting outside.  On arrival, he was sitting on the curb w/ his belonging in a garbage bag.  He states he came home from work and was told he could no longer stay there.  He did not wish to elaborate on there situation.  He did state that he felt safe and that he was waiting on a call from one of his aunts who he thinks will help him have a place to stay.   Pt denies chest pain or SOB.  He is doing well with med compliance.  He has an appointment w/ H&V on Monday @ 3:30 and is aware of same. ACTION: Home visit completed  Mary Claudene Kennel [phone] 05/08/24  Patient Care Team: Celestia Rosaline SQUIBB, NP as PCP - General (Internal Medicine) Bensimhon, Toribio SAUNDERS, MD as PCP - Advanced Heart Failure (Cardiology) Patient, No Pcp Per (General Practice) Frances Ozell RAMAN, LCSW as VBCI Care Management (Licensed Clinical Social Worker)  Patient Active Problem List   Diagnosis Date Noted   Acute gout 04/04/2024   Chest pain 04/02/2024   CKD stage 3b, GFR 30-44 ml/min  (HCC) - baseline scr 1.3-1.5 04/02/2024   Hyperkalemia 04/02/2024   CAD (coronary artery disease) 04/02/2024   Acute on chronic systolic CHF (congestive heart failure) (HCC) 04/02/2024   Shortness of breath 11/25/2023   Primary hypertension 11/24/2023   Heart failure (HCC) 11/23/2023   ETOH abuse 11/23/2023   Elevated troponin 11/23/2023   Leukocytosis 11/23/2023   Acute pulmonary edema (HCC) 11/23/2023    Current Outpatient Medications:    aspirin  EC 81 MG tablet, Take 1 tablet (81 mg total) by mouth daily. Swallow whole., Disp: 30 tablet, Rfl: 12   clotrimazole-betamethasone (LOTRISONE) cream, Apply 1 Application topically 2 (two) times daily., Disp: 45 g, Rfl: 1   cyclobenzaprine  (FLEXERIL ) 10 MG tablet, Take 10 mg by mouth 2 (two) times daily as needed., Disp: , Rfl:    digoxin  (LANOXIN ) 0.125 MG tablet, Take 1 tablet (0.125 mg total) by mouth daily., Disp: 30 tablet, Rfl: 1   empagliflozin  (JARDIANCE ) 10 MG TABS tablet, Take 1 tablet (10 mg total) by mouth daily before breakfast., Disp: 30 tablet, Rfl: 1   furosemide  (LASIX ) 40 MG tablet, Take 1 tablet (40 mg total) by mouth daily., Disp: 30 tablet, Rfl: 0   metoprolol  succinate (TOPROL -XL) 25 MG 24 hr tablet, Take 0.5 tablets (12.5 mg total) by  mouth daily., Disp: 15 tablet, Rfl: 1   rosuvastatin  (CRESTOR ) 20 MG tablet, Take 1 tablet (20 mg total) by mouth daily., Disp: 30 tablet, Rfl: 1   sacubitril-valsartan (ENTRESTO) 24-26 MG, Take 1 tablet by mouth 2 (two) times daily., Disp: 60 tablet, Rfl: 0   spironolactone (ALDACTONE) 25 MG tablet, Take 1/2 tablets (12.5 mg total) by mouth daily., Disp: 30 tablet, Rfl: 1   albuterol  (VENTOLIN  HFA) 108 (90 Base) MCG/ACT inhaler, Inhale 1-2 puffs into the lungs every 6 (six) hours as needed for wheezing or shortness of breath. (Patient not taking: Reported on 05/08/2024), Disp: 18 g, Rfl: 1   albuterol  (VENTOLIN  HFA) 108 (90 Base) MCG/ACT inhaler, Inhale 2 puffs into the lungs every 6 (six) hours  as needed for wheezing or shortness of breath. (Patient not taking: Reported on 05/08/2024), Disp: 6.7 g, Rfl: 0   aspirin  EC 81 MG tablet, Take 1 tablet (81 mg total) by mouth daily. Swallow whole., Disp: 30 tablet, Rfl: 12   hydrOXYzine (ATARAX) 10 MG tablet, Take 1 tablet (10 mg total) by mouth 3 (three) times daily as needed. (Patient not taking: Reported on 05/08/2024), Disp: 30 tablet, Rfl: 0   predniSONE  (DELTASONE ) 20 MG tablet, Take 2 tablets (40 mg total) by mouth daily with breakfast. (Patient not taking: Reported on 05/08/2024), Disp: 4 tablet, Rfl: 0 Allergies  Allergen Reactions   Shrimp [Shellfish Allergy] Anaphylaxis and Itching    ALL SHELLFISH   Shellfish Allergy Itching     Social History   Socioeconomic History   Marital status: Single    Spouse name: Not on file   Number of children: Not on file   Years of education: Not on file   Highest education level: Not on file  Occupational History   Not on file  Tobacco Use   Smoking status: Some Days    Current packs/day: 0.25    Types: Cigarettes   Smokeless tobacco: Never  Vaping Use   Vaping status: Never Used  Substance and Sexual Activity   Alcohol use: Yes    Comment: weekends only   Drug use: No   Sexual activity: Not on file  Other Topics Concern   Not on file  Social History Narrative   ** Merged History Encounter **       Homeless at the time may smoke one cigarette a day   Social Drivers of Corporate Investment Banker Strain: High Risk (04/12/2024)   Overall Financial Resource Strain (CARDIA)    Difficulty of Paying Living Expenses: Hard  Food Insecurity: Food Insecurity Present (04/29/2024)   Hunger Vital Sign    Worried About Running Out of Food in the Last Year: Sometimes true    Ran Out of Food in the Last Year: Sometimes true  Transportation Needs: Unmet Transportation Needs (04/29/2024)   PRAPARE - Transportation    Lack of Transportation (Medical): Yes    Lack of Transportation  (Non-Medical): Yes  Physical Activity: Not on file  Stress: Stress Concern Present (03/20/2024)   [employer] of Occupational Health - Occupational Stress Questionnaire    Feeling of Stress: To some extent  Social Connections: Not on file  Intimate Partner Violence: Not At Risk (04/02/2024)   Humiliation, Afraid, Rape, and Kick questionnaire    Fear of Current or Ex-Partner: No    Emotionally Abused: No    Physically Abused: No    Sexually Abused: No    Physical Exam      Future Appointments  Date Time Provider Department Center  05/13/2024  3:30 PM [facility] PA/NP [facility] None  06/04/2024 10:45 AM Forrest, Ozell RAMAN, LCSW [facility] None

## 2024-05-08 NOTE — Progress Notes (Incomplete)
 Advanced Heart Failure Clinic Note   HF Cardiologist: Dr. Cherrie   Chief Complaint: HFrEF  HPI: Jesse Key is a 58 y.o. male w/ no prior routine medical care until recently.  Past medical history of tobacco and alcohol use, and chronic systolic heart failure. Note he has another chart marked for merge.   Admitted 5/25 with new acute systolic heart failure. Echo showed EF < 20%, normal RV, global HK, mid-mod MR. Diuresed well with IV lasix  and he was started on GDMT. R/LHC showed non-obstructive CAD, EF 25%, well compensated filling pressures with moderate to severely reduced CO (PCW 7, CI 2.07). cMRI showed LVEF 18%, mod dilated LV with septal-lateral dyssynchrony consistent with LBBB and diffuse severe HK. Normal RV, RVEF 35%. Mild-mod MR. Concern for LBBB CM. Discharged, weight 126.5 lbs.   Patient presented to clinic 9/29 for routine follow-up and was sent to the ED to progressive HF symptoms including dyspnea at rest. Symptoms started after contracting COVID 2 weeks prior. During that time he had stopped taking some of his meds and was drinking a lot of fluid. BNP > 4500. He was admitted and diuresed by Advanced Heart Failure service. GDMT titrated.  He is here today for post hospital CHF follow-up. Reports he went back to work on 10/8. He gets short of breath with heavy lifting (40-50 lb) or walking long distances. Feels fatigued after taking meds and has a hard time getting through the day. Had to call out of work yesterday. Home weight stable at 134 lb. No orthopnea, PND or lower extremity edema. No abdominal bloating. Had one episode of emesis after meds yesterday, no recurrence with meds this am. Gets lightheaded if stands up or moves too quickly.   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 Lives in a boarding home like situation with multiple others. Home is loud and chaotic for him. HF clinic has been assisting with providing resources for housing, food, and  transportation.  Denies ETOH, tobacco and drug use.   ROS: All systems negative except as listed in HPI, PMH and Problem List.  SH:  Social History   Socioeconomic History   Marital status: Single    Spouse name: Not on file   Number of children: Not on file   Years of education: Not on file   Highest education level: Not on file  Occupational History   Not on file  Tobacco Use   Smoking status: Some Days    Current packs/day: 0.25    Types: Cigarettes   Smokeless tobacco: Never  Vaping Use   Vaping status: Never Used  Substance and Sexual Activity   Alcohol use: Yes    Comment: weekends only   Drug use: No   Sexual activity: Not on file  Other Topics Concern   Not on file  Social History Narrative   ** Merged History Encounter **       Homeless at the time may smoke one cigarette a day   Social Drivers of Corporate Investment Banker Strain: High Risk (04/12/2024)   Overall Financial Resource Strain (CARDIA)    Difficulty of Paying Living Expenses: Hard  Food Insecurity: Food Insecurity Present (04/29/2024)   Hunger Vital Sign    Worried About Running Out of Food in the Last Year: Sometimes true    Ran Out of Food in the Last Year: Sometimes true  Transportation Needs: Unmet Transportation Needs (04/29/2024)   PRAPARE - Transportation    Lack of  Transportation (Medical): Yes    Lack of Transportation (Non-Medical): Yes  Physical Activity: Not on file  Stress: Stress Concern Present (03/20/2024)   [employer] of Occupational Health - Occupational Stress Questionnaire    Feeling of Stress: To some extent  Social Connections: Not on file  Intimate Partner Violence: Not At Risk (04/02/2024)   Humiliation, Afraid, Rape, and Kick questionnaire    Fear of Current or Ex-Partner: No    Emotionally Abused: No    Physically Abused: No    Sexually Abused: No    FH:  Family History  Problem Relation Age of Onset   Diabetes Mother    Hypertension Mother     Diabetes Father    Hypertension Father     Past Medical History:  Diagnosis Date   Asthma    Shellfish allergy     Current Outpatient Medications  Medication Sig Dispense Refill   albuterol  (VENTOLIN  HFA) 108 (90 Base) MCG/ACT inhaler Inhale 1-2 puffs into the lungs every 6 (six) hours as needed for wheezing or shortness of breath. (Patient not taking: Reported on 05/08/2024) 18 g 1   albuterol  (VENTOLIN  HFA) 108 (90 Base) MCG/ACT inhaler Inhale 2 puffs into the lungs every 6 (six) hours as needed for wheezing or shortness of breath. (Patient not taking: Reported on 05/08/2024) 6.7 g 0   aspirin  EC 81 MG tablet Take 1 tablet (81 mg total) by mouth daily. Swallow whole. 30 tablet 12   aspirin  EC 81 MG tablet Take 1 tablet (81 mg total) by mouth daily. Swallow whole. 30 tablet 12   clotrimazole-betamethasone (LOTRISONE) cream Apply 1 Application topically 2 (two) times daily. 45 g 1   cyclobenzaprine  (FLEXERIL ) 10 MG tablet Take 10 mg by mouth 2 (two) times daily as needed.     digoxin  (LANOXIN ) 0.125 MG tablet Take 1 tablet (0.125 mg total) by mouth daily. 30 tablet 1   empagliflozin  (JARDIANCE ) 10 MG TABS tablet Take 1 tablet (10 mg total) by mouth daily before breakfast. 30 tablet 1   furosemide  (LASIX ) 40 MG tablet Take 1 tablet (40 mg total) by mouth daily. 30 tablet 0   hydrOXYzine (ATARAX) 10 MG tablet Take 1 tablet (10 mg total) by mouth 3 (three) times daily as needed. (Patient not taking: Reported on 05/08/2024) 30 tablet 0   metoprolol  succinate (TOPROL -XL) 25 MG 24 hr tablet Take 0.5 tablets (12.5 mg total) by mouth daily. 15 tablet 1   predniSONE  (DELTASONE ) 20 MG tablet Take 2 tablets (40 mg total) by mouth daily with breakfast. (Patient not taking: Reported on 05/08/2024) 4 tablet 0   rosuvastatin  (CRESTOR ) 20 MG tablet Take 1 tablet (20 mg total) by mouth daily. 30 tablet 1   sacubitril-valsartan (ENTRESTO) 24-26 MG Take 1 tablet by mouth 2 (two) times daily. 60 tablet 0    spironolactone (ALDACTONE) 25 MG tablet Take 1/2 tablets (12.5 mg total) by mouth daily. 30 tablet 1   No current facility-administered medications for this visit.    There were no vitals filed for this visit.   PHYSICAL EXAM:  General:  Thin, fatigued appearing Cor: No JVD. Regular rate & rhythm. No murmurs. Lungs: clear Abdomen: soft, nontender, nondistended.  Extremities: no edema Neuro: alert & orientedx3. Affect pleasant.  ReDS 35%, normal  ECG SR 96 bpm, LBBB, QRS 150s   ASSESSMENT & PLAN:  1. Chronic systolic CHF: Nonischemic cardiomyopathy. Echo in 5/25 showed EF < 20%, RV nl, global HK, mid-mod MR. RHC/LHC in 5/25 showed non-obstructive  CAD, well-compensated filling pressures with CI 2.07.  Cardiac MRI in 5/25 showed LV EF 18%, RV EF 35%, nonspecific RV insertion site LGE.  Echo 9/30: EF 15-20%, RV mildly reduced, RVSP 55 mmHg, severe MR, severe TR, dilated IVC.  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  - NYHA III, Volume okay on exam and by ReDS which is 35% - Continue Lasix  40 mg daily - Continue digoxin  0.125 daily. Level < 0.6 during recent admit - Continue Jardiance  10 daily - Continue Toprol  XL 12.5 daily - Continue spironolactone 12.5 daily. - Continue Entresto 24/26 bid - Feels tired after medications and notes intermittent lightheadedness. Hold off on further titration today. He can space out meds and take some such as metoprolol  and spiro in evening to see if that helps with fatigue. He was not orthostatic today. May have to scale back meds at f/u if no improvement. - Check CMET and BNP - Suspect he will eventually benefit from CRT-D or even advanced therapies if symptoms continue to progress.  - Provided letter for him to remain out of work until 10/13 then return part-time work (20 hrs or less) and rest breaks as needed until follow-up. May eventually need to consider disability  2. CKD stage 3a: Creatinine 1.55>1.83>1.72 during  recent admit, suspect baseline is around 1.5.  - Labs today  3. 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   - No chest pain - Continue ASA, statin.   4. SDOH: Some hx of noncompliance but suspect this is due to poor health literacy. Trying to take meds consistently. - Listed as THN on one chart but not the other (HF CSW looking into this). Hopefully will qualify for paramedicine - Gifted HF food bag and bus passes today - In a chaotic living situation, planning to stay with brother for a while until health improves      Follow up 2-3 weeks with APP

## 2024-05-10 ENCOUNTER — Telehealth (HOSPITAL_COMMUNITY): Payer: Self-pay

## 2024-05-10 NOTE — Telephone Encounter (Signed)
 Called to confirm/remind patient of their appointment at the [facility] Clinic on 05/13/24.   Appointment:   [] Confirmed  [x] Left mess   [] No answer/No voice mail  [] VM Full/unable to leave message  [] Phone not in service  And to bring in all medications and/or complete list.

## 2024-05-13 ENCOUNTER — Ambulatory Visit (HOSPITAL_COMMUNITY)
Admission: RE | Admit: 2024-05-13 | Discharge: 2024-05-13 | Disposition: A | Discharge status: Home / Self Care | Source: Ambulatory Visit

## 2024-05-14 ENCOUNTER — Telehealth (HOSPITAL_COMMUNITY): Payer: Self-pay | Admitting: Emergency Medicine

## 2024-05-14 NOTE — Telephone Encounter (Signed)
 Text Mr. Chait to advise him I was able to rescheduled appointment w/ H&V Monday 11/17 @ 3:30.    Mary Sharps, EMT-Paramedic [phone] 05/14/2024

## 2024-05-15 ENCOUNTER — Telehealth: Payer: Self-pay | Admitting: Licensed Clinical Social Worker

## 2024-05-15 ENCOUNTER — Telehealth (HOSPITAL_COMMUNITY): Payer: Self-pay | Admitting: Licensed Clinical Social Worker

## 2024-05-15 NOTE — Telephone Encounter (Signed)
 CSW informed that [facility] worker [phone] Alfonso having trouble reaching pt.  CSW attempted to call pt but unable to reach- left VM requesting return call- sent text message informing of need to call Alfonso and informed paramedic of message from [place].  Will continue to follow and assist as needed  Andriette HILARIO Leech, LCSW Clinical Social Worker Advanced [facility] Failure Clinic Desk#: [phone] Cell#: [phone]

## 2024-05-15 NOTE — Telephone Encounter (Signed)
 H&V Care Navigation CSW Progress Note  Clinical Social Worker received a call from [place] with Disability Determination Services to share that she hasn't been able to reach pt regarding questions needed for his claim. She had my number as a third part contact. LCSW will discuss with colleague Jenna, as pt is active with paramedicine team to see if he is able to give Alfonso a call back [phone] at [phone].  Patient is participating in a Managed Medicaid Plan:  No, [employer]   SDOH Screenings   Food Insecurity: Food Insecurity Present (04/29/2024)  Housing: High Risk (04/29/2024)  Transportation Needs: Unmet Transportation Needs (04/29/2024)  Utilities: At Risk (04/02/2024)  Depression (PHQ2-9): Medium Risk (04/29/2024)  Financial Resource Strain: High Risk (04/12/2024)  Stress: Stress Concern Present (03/20/2024)  Tobacco Use: High Risk (04/12/2024)  Health Literacy: Adequate Health Literacy (01/03/2024)    Marit Lark, MSW, LCSW Clinical Social Worker II [facility] Heart/Vascular Care Navigation  [phone]- work cell phone (preferred)

## 2024-05-16 ENCOUNTER — Other Ambulatory Visit (HOSPITAL_COMMUNITY): Payer: Self-pay | Admitting: Physician Assistant

## 2024-05-16 ENCOUNTER — Other Ambulatory Visit (HOSPITAL_COMMUNITY): Payer: Self-pay

## 2024-05-16 ENCOUNTER — Other Ambulatory Visit (HOSPITAL_COMMUNITY): Payer: Self-pay | Admitting: Emergency Medicine

## 2024-05-16 NOTE — Progress Notes (Signed)
 Paramedicine Encounter    Patient ID: Jesse Key, male    DOB: [birth year], 58 y.o.   MRN: [id number]   Complaints NONE  Assessment A&O x 4, skin W&D w/ good color.  Denies chest pain or SOB.  Lung sounds clear and equal bilat. No peripheral edema noted.  Compliance with meds YES  Pill box filled x 1 week  Refills needed Jardiance , Metoprolol , Rosuvastatin   Meds changes since last visit NONE    Social changes NONE   BP (!) 138/90 (BP Location: Left Arm, Patient Position: Sitting, Cuff Size: Normal)   Pulse 94   Wt 134 lb (60.8 kg)   SpO2 96%   BMI 22.30 kg/m  Weight yesterday- Last visit weight-135lb  ACTION: Home visit completed  Mary Claudene Kennel [phone] 05/22/24  Patient Care Team: Celestia Rosaline SQUIBB, NP as PCP - General (Internal Medicine) Bensimhon, Toribio SAUNDERS, MD as PCP - Advanced Heart Failure (Cardiology) Patient, No Pcp Per (General Practice) Frances Ozell RAMAN, LCSW as VBCI Care Management (Licensed Clinical Social Worker)  Patient Active Problem List   Diagnosis Date Noted   Acute gout 04/04/2024   Chest pain 04/02/2024   CKD stage 3b, GFR 30-44 ml/min (HCC) - baseline scr 1.3-1.5 04/02/2024   Hyperkalemia 04/02/2024   CAD (coronary artery disease) 04/02/2024   Acute on chronic systolic CHF (congestive heart failure) (HCC) 04/02/2024   Shortness of breath 11/25/2023   Primary hypertension 11/24/2023   Heart failure (HCC) 11/23/2023   ETOH abuse 11/23/2023   Elevated troponin 11/23/2023   Leukocytosis 11/23/2023   Acute pulmonary edema (HCC) 11/23/2023    Current Outpatient Medications:    aspirin  EC 81 MG tablet, Take 1 tablet (81 mg total) by mouth daily. Swallow whole., Disp: 30 tablet, Rfl: 12   clotrimazole-betamethasone (LOTRISONE) cream, Apply 1 Application topically 2 (two) times daily., Disp: 45 g, Rfl: 1   cyclobenzaprine  (FLEXERIL ) 10 MG tablet, Take 10 mg by mouth 2 (two) times daily as needed., Disp: , Rfl:     digoxin  (LANOXIN ) 0.125 MG tablet, Take 1 tablet (0.125 mg total) by mouth daily., Disp: 30 tablet, Rfl: 1   furosemide  (LASIX ) 40 MG tablet, Take 1 tablet (40 mg total) by mouth daily., Disp: 30 tablet, Rfl: 0   sacubitril-valsartan (ENTRESTO) 24-26 MG, Take 1 tablet by mouth 2 (two) times daily., Disp: 60 tablet, Rfl: 0   albuterol  (VENTOLIN  HFA) 108 (90 Base) MCG/ACT inhaler, Inhale 1-2 puffs into the lungs every 6 (six) hours as needed for wheezing or shortness of breath., Disp: 18 g, Rfl: 1   albuterol  (VENTOLIN  HFA) 108 (90 Base) MCG/ACT inhaler, Inhale 2 puffs into the lungs every 6 (six) hours as needed for wheezing or shortness of breath., Disp: 6.7 g, Rfl: 0   aspirin  EC 81 MG tablet, Take 1 tablet (81 mg total) by mouth daily. Swallow whole., Disp: 30 tablet, Rfl: 12   empagliflozin  (JARDIANCE ) 10 MG TABS tablet, Take 1 tablet (10 mg total) by mouth daily before breakfast., Disp: 90 tablet, Rfl: 1   hydrOXYzine (ATARAX) 10 MG tablet, Take 1 tablet (10 mg total) by mouth 3 (three) times daily as needed., Disp: 30 tablet, Rfl: 0   metoprolol  succinate (TOPROL -XL) 25 MG 24 hr tablet, Take 1/2 tablet (12.5 mg total) by mouth daily., Disp: 45 tablet, Rfl: 1   predniSONE  (DELTASONE ) 20 MG tablet, Take 2 tablets (40 mg total) by mouth daily with breakfast., Disp: 4 tablet, Rfl: 0   rosuvastatin  (CRESTOR ) 20 MG tablet,  Take 1 tablet (20 mg total) by mouth daily., Disp: 90 tablet, Rfl: 1   spironolactone (ALDACTONE) 25 MG tablet, Take 1 tablet (25 mg total) by mouth at bedtime., Disp: 30 tablet, Rfl: 6 Allergies  Allergen Reactions   Shrimp [Shellfish Allergy] Anaphylaxis and Itching    ALL SHELLFISH   Shellfish Allergy Itching     Social History   Socioeconomic History   Marital status: Single    Spouse name: Not on file   Number of children: Not on file   Years of education: Not on file   Highest education level: Not on file  Occupational History   Not on file  Tobacco Use    Smoking status: Some Days    Current packs/day: 0.25    Types: Cigarettes   Smokeless tobacco: Never  Vaping Use   Vaping status: Never Used  Substance and Sexual Activity   Alcohol use: Yes    Comment: weekends only   Drug use: No   Sexual activity: Not on file  Other Topics Concern   Not on file  Social History Narrative   ** Merged History Encounter **       Homeless at the time may smoke one cigarette a day   Social Drivers of Corporate Investment Banker Strain: High Risk (04/12/2024)   Overall Financial Resource Strain (CARDIA)    Difficulty of Paying Living Expenses: Hard  Food Insecurity: Food Insecurity Present (04/29/2024)   Hunger Vital Sign    Worried About Running Out of Food in the Last Year: Sometimes true    Ran Out of Food in the Last Year: Sometimes true  Transportation Needs: Unmet Transportation Needs (04/29/2024)   PRAPARE - Administrator, Civil Service (Medical): Yes    Lack of Transportation (Non-Medical): Yes  Physical Activity: Not on file  Stress: Stress Concern Present (03/20/2024)   [employer] of Occupational Health - Occupational Stress Questionnaire    Feeling of Stress: To some extent  Social Connections: Not on file  Intimate Partner Violence: Not At Risk (04/02/2024)   Humiliation, Afraid, Rape, and Kick questionnaire    Fear of Current or Ex-Partner: No    Emotionally Abused: No    Physically Abused: No    Sexually Abused: No    Physical Exam      Future Appointments  Date Time Provider Department Center  06/03/2024  3:45 PM [facility] LAB [facility] None  06/04/2024 10:45 AM Frances, Ozell RAMAN, LCSW [facility] None  06/20/2024  3:30 PM [facility] PA/NP [facility] None

## 2024-05-17 ENCOUNTER — Telehealth (HOSPITAL_COMMUNITY): Payer: Self-pay

## 2024-05-17 ENCOUNTER — Other Ambulatory Visit (HOSPITAL_COMMUNITY): Payer: Self-pay

## 2024-05-17 MED FILL — Metoprolol Succinate Tab ER 24HR 25 MG (Tartrate Equiv): ORAL | 30 days supply | Qty: 15 | Fill #0 | Status: AC

## 2024-05-17 MED FILL — Rosuvastatin Calcium Tab 20 MG: ORAL | 30 days supply | Qty: 30 | Fill #0 | Status: AC

## 2024-05-17 MED FILL — Empagliflozin Tab 10 MG: ORAL | 30 days supply | Qty: 30 | Fill #0 | Status: AC

## 2024-05-17 NOTE — Telephone Encounter (Signed)
 Called to confirm/remind patient of their appointment at the [facility] on 05/17/2024.   Appointment:   [x] Confirmed  [] Left mess   [] No answer/No voice mail  [] VM Full/unable to leave message  [] Phone not in service  Patient reminded to bring all medications and/or complete list.  Confirmed patient has transportation. Gave directions, instructed to utilize valet parking.

## 2024-05-20 ENCOUNTER — Telehealth (HOSPITAL_COMMUNITY): Payer: Self-pay | Admitting: *Deleted

## 2024-05-20 ENCOUNTER — Ambulatory Visit (HOSPITAL_COMMUNITY)

## 2024-05-20 NOTE — Telephone Encounter (Signed)
 Called to confirm/remind patient of their appointment at the [facility] Clinic on 05/21/24:      Appointment:              [x] Confirmed             [] Left mess              [] No answer/No voice mail             [] Phone not in service   Patient reminded to bring all medications and/or complete list.   Confirmed patient has transportation. Gave directions, instructed to utilize valet parking.

## 2024-05-21 ENCOUNTER — Other Ambulatory Visit (HOSPITAL_COMMUNITY): Payer: Self-pay

## 2024-05-21 ENCOUNTER — Encounter (HOSPITAL_COMMUNITY): Payer: Self-pay

## 2024-05-21 ENCOUNTER — Ambulatory Visit (HOSPITAL_COMMUNITY)
Admission: RE | Admit: 2024-05-21 | Discharge: 2024-05-21 | Disposition: A | Discharge status: Home / Self Care | Source: Ambulatory Visit | Attending: Cardiology | Admitting: Cardiology

## 2024-05-21 ENCOUNTER — Ambulatory Visit (HOSPITAL_COMMUNITY): Payer: Self-pay | Admitting: Cardiology

## 2024-05-21 VITALS — BP 120/76 | HR 77 | Ht 65.0 in | Wt 136.2 lb

## 2024-05-21 DIAGNOSIS — Z556 Problems related to health literacy: Secondary | ICD-10-CM | POA: Insufficient documentation

## 2024-05-21 DIAGNOSIS — I5022 Chronic systolic (congestive) heart failure: Secondary | ICD-10-CM | POA: Diagnosis not present

## 2024-05-21 DIAGNOSIS — F1091 Alcohol use, unspecified, in remission: Secondary | ICD-10-CM | POA: Insufficient documentation

## 2024-05-21 DIAGNOSIS — Z87891 Personal history of nicotine dependence: Secondary | ICD-10-CM | POA: Insufficient documentation

## 2024-05-21 DIAGNOSIS — I447 Left bundle-branch block, unspecified: Secondary | ICD-10-CM | POA: Diagnosis not present

## 2024-05-21 DIAGNOSIS — Z5901 Sheltered homelessness: Secondary | ICD-10-CM | POA: Insufficient documentation

## 2024-05-21 DIAGNOSIS — I428 Other cardiomyopathies: Secondary | ICD-10-CM | POA: Insufficient documentation

## 2024-05-21 DIAGNOSIS — I2510 Atherosclerotic heart disease of native coronary artery without angina pectoris: Secondary | ICD-10-CM | POA: Diagnosis not present

## 2024-05-21 DIAGNOSIS — I340 Nonrheumatic mitral (valve) insufficiency: Secondary | ICD-10-CM | POA: Insufficient documentation

## 2024-05-21 DIAGNOSIS — Z139 Encounter for screening, unspecified: Secondary | ICD-10-CM | POA: Diagnosis not present

## 2024-05-21 DIAGNOSIS — N1831 Chronic kidney disease, stage 3a: Secondary | ICD-10-CM | POA: Diagnosis not present

## 2024-05-21 LAB — BASIC METABOLIC PANEL WITH GFR
Anion gap: 11 (ref 5–15)
BUN: 18 mg/dL (ref 6–20)
CO2: 27 mmol/L (ref 22–32)
Calcium: 9.2 mg/dL (ref 8.9–10.3)
Chloride: 104 mmol/L (ref 98–111)
Creatinine, Ser: 1.4 mg/dL — ABNORMAL HIGH (ref 0.61–1.24)
GFR, Estimated: 58 mL/min — ABNORMAL LOW
Glucose, Bld: 113 mg/dL — ABNORMAL HIGH (ref 70–99)
Potassium: 4.5 mmol/L (ref 3.5–5.1)
Sodium: 142 mmol/L (ref 135–145)

## 2024-05-21 LAB — BRAIN NATRIURETIC PEPTIDE: B Natriuretic Peptide: 3013.7 pg/mL — ABNORMAL HIGH (ref 0.0–100.0)

## 2024-05-21 MED ORDER — SPIRONOLACTONE 25 MG PO TABS
25.0000 mg | ORAL_TABLET | Freq: Every day | ORAL | 6 refills | Status: AC
  Filled 2024-05-21: qty 30, 30d supply, fill #0
  Filled 2024-06-26: qty 30, 30d supply, fill #1

## 2024-05-21 NOTE — Patient Instructions (Signed)
 Medication Changes:  INCREASE Spironolactone to 25 mg (1 tab) Daily at Bedtime  Lab Work:  Labs done today, your results will be available in MyChart, we will contact you for abnormal readings.  Your physician recommends that you return for lab work in: 1-2 weeks  Testing/Procedures:  Your physician has requested that you have an echocardiogram. Echocardiography is a painless test that uses sound waves to create images of your heart. It provides your doctor with information about the size and shape of your heart and how well your heart's chambers and valves are working. This procedure takes approximately one hour. There are no restrictions for this procedure. Please do NOT wear cologne, perfume, aftershave, or lotions (deodorant is allowed). Please arrive 15 minutes prior to your appointment time. IN 3 MONTHS   Please note: We ask at that you not bring children with you during ultrasound (echo/ vascular) testing. Due to room size and safety concerns, children are not allowed in the ultrasound rooms during exams. Our front office staff cannot provide observation of children in our lobby area while testing is being conducted. An adult accompanying a patient to their appointment will only be allowed in the ultrasound room at the discretion of the ultrasound technician under special circumstances. We apologize for any inconvenience.   Referrals:  You have been referred to EP to discuss getting a defibrillator, they will call you to schedule an appointment  Special Instructions // Education:  Do the following things EVERYDAY: Weigh yourself in the morning before breakfast. Write it down and keep it in a log. Take your medicines as prescribed Eat low salt foods--Limit salt (sodium) to 2000 mg per day.  Stay as active as you can everyday Limit all fluids for the day to less than 2 liters   Follow-Up in: 1 month and again in 3 months with an echocardiogram   At the [facility], you and your health needs are our priority. We have a designated team specialized in the treatment of Heart Failure. This Care Team includes your primary Heart Failure Specialized Cardiologist (physician), Advanced Practice Providers (APPs- Physician Assistants and Nurse Practitioners), and Pharmacist who all work together to provide you with the care you need, when you need it.   You may see any of the following providers on your designated Care Team at your next follow up:  Dr. Toribio Fuel Dr. Ezra Shuck Dr. Odis Brownie Greig Mosses, NP Caffie Shed, [place] [facility], [place] Beckey Coe, NP [name], NP Tinnie Redman, PharmD   Please be sure to bring in all your medications bottles to every appointment.   Need to Contact Us :  If you have any questions or concerns before your next appointment please send us  a message through [place] or call our office at [phone].    TO LEAVE A MESSAGE FOR THE NURSE SELECT OPTION 2, PLEASE LEAVE A MESSAGE INCLUDING: YOUR NAME DATE OF BIRTH CALL BACK NUMBER REASON FOR CALL**this is important as we prioritize the call backs  YOU WILL RECEIVE A CALL BACK THE SAME DAY AS LONG AS YOU CALL BEFORE 4:00 PM

## 2024-05-21 NOTE — Progress Notes (Signed)
 [facility] Note  PCP: Celestia Rosaline SQUIBB, NP HF Cardiologist: Dr. Cherrie  HPI: Jesse Key is a 58 y.o. male w/ no prior routine medical care until recently. History of tobacco and alcohol use, and chronic systolic heart failure. Note he has another chart marked for merge.   Admitted 5/25 with new acute systolic heart failure. Echo showed EF < 20%, normal RV, global HK, mid-mod MR. Diuresed well with IV lasix  and he was started on GDMT. R/LHC showed non-obstructive CAD, EF 25%, well compensated filling pressures with moderate to severely reduced CO (PCW 7, CI 2.07). cMRI showed LVEF 18%, mod dilated LV with septal-lateral dyssynchrony consistent with LBBB and diffuse severe HK. Normal RV, RVEF 35%. Mild-mod MR. Concern for LBBB CM. Discharged, weight 126.5 lbs.   Patient presented to clinic 9/29 for routine follow-up and was sent to the ED to progressive HF symptoms including dyspnea at rest. Symptoms started after contracting COVID 2 weeks prior. During that time he had stopped taking some of his meds and was drinking a lot of fluid. BNP > 4500. He was admitted and diuresed by Advanced Heart Failure service. GDMT titrated.  He returns today for heart failure follow up. Overall feeling better than before. NYHA III. Does get short of breath with lifting and walking long distances. Was briefly living in his car, making transportation and clinic visits difficult. His cousin has taken him in to help him get on his feet. He has taken off from [employer] due to high stress and physical work load, he is now seasonally working at [employer], which has been much easier for him. Reports that he is no longer having food disparity concerns. Denies chest pain, near-syncope, palpitations, and dizziness. Able to perform ADLs. Appetite ok. Compliant with all medications, taking spiro and toprol  at night time.  Denies ETOH, tobacco and drug use.  ROS: All systems negative  except as listed in HPI, PMH and Problem List.  SH:  Social History   Socioeconomic History   Marital status: Single    Spouse name: Not on file   Number of children: Not on file   Years of education: Not on file   Highest education level: Not on file  Occupational History   Not on file  Tobacco Use   Smoking status: Some Days    Current packs/day: 0.25    Types: Cigarettes   Smokeless tobacco: Never  Vaping Use   Vaping status: Never Used  Substance and Sexual Activity   Alcohol use: Yes    Comment: weekends only   Drug use: No   Sexual activity: Not on file  Other Topics Concern   Not on file  Social History Narrative   ** Merged History Encounter **       Homeless at the time may smoke one cigarette a day   Social Drivers of Corporate Investment Banker Strain: High Risk (04/12/2024)   Overall Financial Resource Strain (CARDIA)    Difficulty of Paying Living Expenses: Hard  Food Insecurity: Food Insecurity Present (04/29/2024)   Hunger Vital Sign    Worried About Running Out of Food in the Last Year: Sometimes true    Ran Out of Food in the Last Year: Sometimes true  Transportation Needs: Unmet Transportation Needs (04/29/2024)   PRAPARE - Administrator, Civil Service (Medical): Yes    Lack of Transportation (Non-Medical): Yes  Physical Activity: Not on file  Stress: Stress  Concern Present (03/20/2024)   [employer] of Occupational Health - Occupational Stress Questionnaire    Feeling of Stress: To some extent  Social Connections: Not on file  Intimate Partner Violence: Not At Risk (04/02/2024)   Humiliation, Afraid, Rape, and Kick questionnaire    Fear of Current or Ex-Partner: No    Emotionally Abused: No    Physically Abused: No    Sexually Abused: No    FH:  Family History  Problem Relation Age of Onset   Diabetes Mother    Hypertension Mother    Diabetes Father    Hypertension Father     Past Medical History:  Diagnosis Date    Asthma    Shellfish allergy     Current Outpatient Medications  Medication Sig Dispense Refill   albuterol  (VENTOLIN  HFA) 108 (90 Base) MCG/ACT inhaler Inhale 1-2 puffs into the lungs every 6 (six) hours as needed for wheezing or shortness of breath. 18 g 1   albuterol  (VENTOLIN  HFA) 108 (90 Base) MCG/ACT inhaler Inhale 2 puffs into the lungs every 6 (six) hours as needed for wheezing or shortness of breath. 6.7 g 0   aspirin  EC 81 MG tablet Take 1 tablet (81 mg total) by mouth daily. Swallow whole. 30 tablet 12   aspirin  EC 81 MG tablet Take 1 tablet (81 mg total) by mouth daily. Swallow whole. 30 tablet 12   clotrimazole -betamethasone  (LOTRISONE ) cream Apply 1 Application topically 2 (two) times daily. 45 g 1   cyclobenzaprine  (FLEXERIL ) 10 MG tablet Take 10 mg by mouth 2 (two) times daily as needed.     digoxin  (LANOXIN ) 0.125 MG tablet Take 1 tablet (0.125 mg total) by mouth daily. 30 tablet 1   empagliflozin  (JARDIANCE ) 10 MG TABS tablet Take 1 tablet (10 mg total) by mouth daily before breakfast. 90 tablet 1   furosemide  (LASIX ) 40 MG tablet Take 1 tablet (40 mg total) by mouth daily. 30 tablet 0   hydrOXYzine  (ATARAX ) 10 MG tablet Take 1 tablet (10 mg total) by mouth 3 (three) times daily as needed. 30 tablet 0   metoprolol  succinate (TOPROL -XL) 25 MG 24 hr tablet Take 1/2 tablet (12.5 mg total) by mouth daily. 45 tablet 1   predniSONE  (DELTASONE ) 20 MG tablet Take 2 tablets (40 mg total) by mouth daily with breakfast. 4 tablet 0   rosuvastatin  (CRESTOR ) 20 MG tablet Take 1 tablet (20 mg total) by mouth daily. 90 tablet 1   sacubitril -valsartan  (ENTRESTO ) 24-26 MG Take 1 tablet by mouth 2 (two) times daily. 60 tablet 0   spironolactone  (ALDACTONE ) 25 MG tablet Take 1/2 tablets (12.5 mg total) by mouth daily. 30 tablet 1   No current facility-administered medications for this encounter.   Vitals:   05/21/24 0917  BP: 120/76  Pulse: 77  SpO2: 99%  Weight: 61.8 kg (136 lb 3.2 oz)   Height: 5' 5 (1.651 m)    Filed Weights   05/21/24 0917  Weight: 61.8 kg (136 lb 3.2 oz)    PHYSICAL EXAM: General: Well appearing. No distress  Cardiac: JVP flat. 2/6 systolic murmur at apex Resp: Lung sounds clear and equal B/L Extremities: Warm and dry.  No edema.  Neuro: A&O x3. Affect pleasant.   ReDS 32%, normal  ASSESSMENT & PLAN:  1. Chronic systolic CHF: Nonischemic cardiomyopathy. Echo in 5/25 showed EF < 20%, RV nl, global HK, mid-mod MR. RHC/LHC in 5/25 showed non-obstructive CAD, well-compensated filling pressures with CI 2.07.  Cardiac MRI in 5/25  showed LV EF 18%, RV EF 35%, nonspecific RV insertion site LGE.  Echo 9/25: EF 15-20%, RV mildly reduced, RVSP 55 mmHg, severe MR, severe TR, dilated IVC.  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  - NYHA III, Euvolemic on exam and ReDs. - continue Lasix  40 mg daily. BMET/BNP today - continue digoxin  0.125 daily. Took this morning. - continue Jardiance  10 daily - continue Toprol  XL 12.5 daily at bedtime - increase spironolactone to 25 daily at bedtime, repeat BMET in 10 days - continue Entresto 24/26 bid - has been feeling better with his medications, no dizziness. Compliance much improved with Paramedicine. He finds Dede, paramedic very helpful. - will refer to EP for CRT-D vs. ICD eval  2. LBBB - QRS only 150 ms on last ECG - refer to EP for eval  3. CKD stage 3a: Creatinine 1.55>1.83>1.72 during recent admit, suspect baseline is around 1.5.  - Labs today  4. 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   - No chest pain - Continue ASA, statin.   5. SDOH:  - poor health literacy - Enrolled in paramedicine, compliance improved - now living with cousin, was briefly living out of car - no food disparities - will provide work note for 2 months with severe HF. May eventually need to consider disability   Follow up 1 month with APP Follow up in 3 months with Dr. Cherrie + echo  Anaisa Radi,  NP 05/21/24

## 2024-05-21 NOTE — Progress Notes (Signed)
 ReDS Vest / Clip - 05/21/24 0900       ReDS Vest / Clip   Station Marker A    Ruler Value 28    ReDS Value Range Low volume    ReDS Actual Value 32

## 2024-05-22 ENCOUNTER — Other Ambulatory Visit (HOSPITAL_COMMUNITY): Payer: Self-pay

## 2024-05-23 ENCOUNTER — Other Ambulatory Visit: Payer: Self-pay

## 2024-05-23 ENCOUNTER — Other Ambulatory Visit (HOSPITAL_COMMUNITY): Payer: Self-pay | Admitting: Emergency Medicine

## 2024-05-23 ENCOUNTER — Telehealth (HOSPITAL_COMMUNITY): Payer: Self-pay | Admitting: Emergency Medicine

## 2024-05-23 ENCOUNTER — Other Ambulatory Visit (HOSPITAL_COMMUNITY): Payer: Self-pay

## 2024-05-23 MED ORDER — DIGOXIN 125 MCG PO TABS
0.1250 mg | ORAL_TABLET | Freq: Every day | ORAL | 1 refills | Status: DC
  Filled 2024-06-11 (×2): qty 30, 30d supply, fill #0

## 2024-05-23 MED ORDER — FUROSEMIDE 40 MG PO TABS
40.0000 mg | ORAL_TABLET | Freq: Every day | ORAL | 1 refills | Status: DC
  Filled 2024-05-23 – 2024-06-07 (×2): qty 30, 30d supply, fill #0

## 2024-05-23 NOTE — Telephone Encounter (Signed)
 Multiple calls and texts to confirm a time for today's home visit.  LVM for pt to call as well as sent texts requesting him to call w/o response.  I advised Mr. Cowsert that I have picked up his refills and will look for him to reach out tomorrow in hopes of delivering refills and doing med rec.    Mary Sharps, EMT-Paramedic [phone] 05/23/2024

## 2024-05-29 NOTE — Progress Notes (Signed)
 Visit complete @ 9:00 p.m.  Med rec only x 2 weeks.  Mr. Husby w/o complaint.  He got home late tonight due to his cousin's car breaking down in [place].  Provided food box from [facility] clinic for Thanksgiving.  Pt very appreciative.     Mary Sharps, EMT-Paramedic [phone]

## 2024-06-03 ENCOUNTER — Ambulatory Visit (HOSPITAL_COMMUNITY)
Admission: RE | Admit: 2024-06-03 | Discharge: 2024-06-03 | Disposition: A | Source: Ambulatory Visit | Attending: Cardiology

## 2024-06-03 DIAGNOSIS — I5022 Chronic systolic (congestive) heart failure: Secondary | ICD-10-CM | POA: Insufficient documentation

## 2024-06-03 LAB — BASIC METABOLIC PANEL WITH GFR
Anion gap: 8 (ref 5–15)
BUN: 29 mg/dL — ABNORMAL HIGH (ref 6–20)
CO2: 30 mmol/L (ref 22–32)
Calcium: 9.1 mg/dL (ref 8.9–10.3)
Chloride: 102 mmol/L (ref 98–111)
Creatinine, Ser: 1.87 mg/dL — ABNORMAL HIGH (ref 0.61–1.24)
GFR, Estimated: 41 mL/min — ABNORMAL LOW (ref >60)
Glucose, Bld: 52 mg/dL — ABNORMAL LOW (ref 70–99)
Potassium: 4.3 mmol/L (ref 3.5–5.1)
Sodium: 140 mmol/L (ref 135–145)

## 2024-06-04 ENCOUNTER — Telehealth (HOSPITAL_COMMUNITY): Payer: Self-pay | Admitting: Emergency Medicine

## 2024-06-04 ENCOUNTER — Other Ambulatory Visit: Payer: Self-pay | Admitting: Licensed Clinical Social Worker

## 2024-06-04 NOTE — Telephone Encounter (Signed)
 Spoke to Jesse Key regarding med change.  He is staying in [facility] and says he will be home after 2:00 tomorrow and I will try and do a home visit then. In the meantime, I reviewed with him the med change for his Spironolactone  tablet and he understands he is to take 1/2 tablet (12.5mg )  at bedtime daily.    Mary Sharps, EMT-Paramedic [phone] 06/04/2024

## 2024-06-04 NOTE — Patient Instructions (Signed)
 Visit Information  Thank you for taking time to visit with me today. Please don't hesitate to contact me if I can be of assistance to you before our next scheduled appointment.  Our next appointment is by telephone on 07/09/2024 at 9:30 AM   Please call the care guide team at [phone] if you need to cancel or reschedule your appointment.   Following is a copy of your care plan:   Goals Addressed             This Visit's Progress    VBCI Social Work Care Plan       Problems:   Transport challenges; [place] bus [place]             Financial challenges              Pain issues             Decreased family support. He does have siblings who he talks to on the phone but both siblings live out of state                Currently residing with his cousin at home of cousin in [place], [place]               Occasional food scarcity  CSW Clinical Goal(s):   Over the next 30 days the Patient will attend all scheduled medical appointments as evidenced by patient report and care team review of appointment completion in electronic medical record. (Client said he has upcoming appointment with cardiologist in December of 2025)            Over next 30 days, patient will research community resources of help (food pantries, housing locations in area) to address current needs AEB patient report of contacting community assistance agencies of choice  Interventions:  Discussed client housing needs and status.  He said he has now found temporary housing. He is residing now at home of his cousin in [place], [place] He said residing with his cousin is working out well. [place] and can use bus system as needed for transport help             Discussed program support with RN, LCSW, Pharmacist            Discussed client support with NP Rosaline Bohr             Discussed client support with cardiologist. Client said he has appointment  in December of 2025 with cardiologist             Discussed transport needs. Discussed client use of Medicaid [employer] for some of his transport needs. He said [place] [employer] also [place] to help  with transport needs. He also said he has a friend who is giving him a ride to and from work at this time.           Discussed employment. 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 He works part time and is enjoying this job            Discussed energy level previously . He has said he sometimes is fatigued. He sometimes gets short of breath in walking longer distances            Provided counseling support for client.             Used Active Listening skills to allow client to share his feelings and discuss  his current needs               Client was in more positive mood. He enjoys residing with his cousin. He enjoys part time job. He is trying to attend scheduled medical appointments                 Discussed food scarcity. He said he has been doing better related to food needs lately. Encouraged client to use local food pantries. He said he goes to [employer] regularly for food support             Encouraged client to call LCSW as needed for SW support at [phone].               Client was appreciative of call from LCSW today  Patient Goals/Self-Care Activities:  Attend scheduled appointments with NP and with cardiologist              Take medications as prescribed             Work job as scheduled at [employer].               Stay in communication with his siblings             Continue current care plan             Go to local food pantry as needed for food support ([employer])             Call LCSW as needed for SW support  Plan:   Telephone follow up appointment with care management team member scheduled for:  07/09/2024 at 9:30 AM        Please go to [facility] [address], [place] [phone]) if you are experiencing a Mental Health or Behavioral Health Crisis or need  someone to talk to.  Patient verbalized understanding of Care plan and visit instructions communicated this visit   Glendia Pear  MSW, LCSW [place]/Value Based Care [facility] Licensed Clinical Social Worker Direct Dial:  [phone] Fax:  [phone] Website:  [email or web address]

## 2024-06-04 NOTE — Telephone Encounter (Signed)
 Called, LVM regarding med change from yesterday's labwork.  Asked him to call me when he gets home from work so I can do med reconciliation.    Mary Sharps, EMT-Paramedic [phone] 06/04/2024

## 2024-06-04 NOTE — Patient Outreach (Signed)
 Complex Care Management   Visit Note  06/04/2024  Name:  Jesse Key MRN: [id number] DOB: February 14, 1966  Situation: Referral received for Complex Care Management related to mental health needs: depression. SDOH needs I obtained verbal consent from Patient.  Visit completed with Patient  on the phone  Background:   Past Medical History:  Diagnosis Date   Asthma    Shellfish allergy     Assessment: Patient Reported Symptoms:  Cognitive Cognitive Status: Alert and oriented to person, place, and time, Difficulties with attention and concentration Cognitive/Intellectual Conditions Management [RPT]: None reported or documented in medical history or problem list   Health Maintenance Behaviors: Stress management Health Facilitated by: Stress management  Neurological Neurological Review of Symptoms: Weakness Neurological Management Strategies: Adequate rest, Coping strategies  HEENT HEENT Symptoms Reported: Sudden change or loss of vision HEENT Management Strategies: Coping strategies    Cardiovascular Cardiovascular Symptoms Reported: Fatigue Cardiovascular Management Strategies: Coping strategies  Respiratory Respiratory Symptoms Reported: Shortness of breath Other Respiratory Symptoms: shortness of breath sometimes in lifting items. SOB sometimes in walking longer distances Respiratory Management Strategies: Coping strategies  Endocrine Endocrine Symptoms Reported: Blurry vision, Shortness of breath, Shakiness, Weakness or fatigue    Gastrointestinal Gastrointestinal Symptoms Reported: No symptoms reported Gastrointestinal Management Strategies: Coping strategies    Genitourinary Genitourinary Symptoms Reported: Frequency Additional Genitourinary Details: takes Lasix  as prescribed Genitourinary Management Strategies: Coping strategies  Integumentary Integumentary Symptoms Reported: Skin changes Additional Integumentary Details: skin issues on feet; has cream to use on feet Skin  Management Strategies: Coping strategies  Musculoskeletal Musculoskelatal Symptoms Reviewed: Muscle pain, Joint pain Musculoskeletal Management Strategies: Coping strategies      Psychosocial Psychosocial Symptoms Reported: Sadness - if selected complete PHQ 2-9, Depression - if selected complete PHQ 2-9 Additional Psychological Details: residing with cousin in home of cousin Behavioral Management Strategies: Coping strategies, Adequate rest Behavioral Health Comment: depression; financial strain; some transport needs occasionally Major Change/Loss/Stressor/Fears (CP): Medical condition, self Techniques to Cope with Loss/Stress/Change: Counseling, Diversional activities, Support group Quality of Family Relationships: supportive Do you feel physically threatened by others?: No At risk of food scarcity. Financial strain    06/04/2024    PHQ2-9 Depression Screening   Little interest or pleasure in doing things Several days  Feeling down, depressed, or hopeless Several days  PHQ-2 - Total Score 2  Trouble falling or staying asleep, or sleeping too much Several days  Feeling tired or having little energy Several days  Poor appetite or overeating  Several days  Feeling bad about yourself - or that you are a failure or have let yourself or your family down Several days  Trouble concentrating on things, such as reading the newspaper or watching television More than half the days  Moving or speaking so slowly that other people could have noticed.  Or the opposite - being so fidgety or restless that you have been moving around a lot more than usual Several days  Thoughts that you would be better off dead, or hurting yourself in some way Not at all  PHQ2-9 Total Score 9  If you checked off any problems, how difficult have these problems made it for you to do your work, take care of things at home, or get along with other people Somewhat difficult  Depression Interventions/Treatment Counseling     Today's Vitals  Client did not mention any BP issues during call today with LCSW  Pain Scale: 0-10 Pain Score: 0-No pain  Medications Reviewed Today   Medications  were not reviewed in this encounter     Recommendation:   PCP Follow-up Continue Current Plan of Care Attend scheduled appointment in December, 2025 with cardiologist Work scheduled hours, as able, at [employer] as needed for SW support Take medications as prescribed  Follow Up Plan:   Telephone follow up appointment date/time:  07/09/2024 at 9:30 AM   Glendia Pear  MSW, LCSW [place]/Value Based Care [facility] Licensed Clinical Social Worker Direct Dial:  [phone] Fax:  [phone] Website:  [email or web address]

## 2024-06-05 ENCOUNTER — Other Ambulatory Visit (HOSPITAL_COMMUNITY): Payer: Self-pay | Admitting: Emergency Medicine

## 2024-06-05 NOTE — Progress Notes (Unsigned)
 Refills: Digoxin , 40 furosemide  Albuterol  Entresto  24-26

## 2024-06-07 ENCOUNTER — Other Ambulatory Visit (HOSPITAL_COMMUNITY): Payer: Self-pay | Admitting: Family Medicine

## 2024-06-07 ENCOUNTER — Other Ambulatory Visit (HOSPITAL_COMMUNITY): Payer: Self-pay

## 2024-06-07 MED ORDER — SACUBITRIL-VALSARTAN 24-26 MG PO TABS
1.0000 | ORAL_TABLET | Freq: Two times a day (BID) | ORAL | 3 refills | Status: DC
  Filled 2024-06-07: qty 60, 30d supply, fill #0

## 2024-06-11 ENCOUNTER — Other Ambulatory Visit (HOSPITAL_COMMUNITY): Payer: Self-pay

## 2024-06-11 ENCOUNTER — Other Ambulatory Visit: Payer: Self-pay

## 2024-06-12 ENCOUNTER — Other Ambulatory Visit (HOSPITAL_COMMUNITY): Payer: Self-pay | Admitting: Emergency Medicine

## 2024-06-12 NOTE — Progress Notes (Signed)
 Paramedicine Encounter    Patient ID: Jesse Key, male    DOB: [birth year], 58 y.o.   MRN: [id number]   Complaints NONE  Assessment A&O x 4, skin W&D w/ good color.  Denies chest pain or SOB. No peripheral edema noted.  Lung sounds w/ some ronchi in upper lobes.  Has a mild productive cough.  Afebrile.  Compliance with meds Yes  Pill box filled x 2 weeks.  Provided a new pill box as some doors were broken off his existing box.  Refills needed Metoprolol , Rosuvastatin   Meds changes since last visit     Social changes NONE   BP 98/60 (BP Location: Right Arm, Patient Position: Sitting)   Pulse 76   Wt 134 lb (60.8 kg)   SpO2 98%   BMI 22.30 kg/m  Weight yesterday- not taken Last visit weight-135lb  ACTION: Home visit completed  Mary Claudene Kennel [phone] 06/13/2024  Patient Care Team: Celestia Rosaline SQUIBB, NP as PCP - General (Internal Medicine) Bensimhon, Toribio SAUNDERS, MD as PCP - Advanced Heart Failure (Cardiology) Patient, No Pcp Per (General Practice) Frances Ozell RAMAN, LCSW as [facility] Care Management (Licensed Clinical Social Worker)  Patient Active Problem List   Diagnosis Date Noted   Acute gout 04/04/2024   Chest pain 04/02/2024   CKD stage 3b, GFR 30-44 ml/min (HCC) - baseline scr 1.3-1.5 04/02/2024   Hyperkalemia 04/02/2024   CAD (coronary artery disease) 04/02/2024   Acute on chronic systolic CHF (congestive heart failure) (HCC) 04/02/2024   Shortness of breath 11/25/2023   Primary hypertension 11/24/2023   Heart failure (HCC) 11/23/2023   ETOH abuse 11/23/2023   Elevated troponin 11/23/2023   Leukocytosis 11/23/2023   Acute pulmonary edema (HCC) 11/23/2023   Current Medications[1] Allergies[2]   Social History   Socioeconomic History   Marital status: Single    Spouse name: Not on file   Number of children: Not on file   Years of education: Not on file   Highest education level: Not on file  Occupational History   Not on file   Tobacco Use   Smoking status: Some Days    Current packs/day: 0.25    Types: Cigarettes   Smokeless tobacco: Never  Vaping Use   Vaping status: Never Used  Substance and Sexual Activity   Alcohol use: Yes    Comment: weekends only   Drug use: No   Sexual activity: Not on file  Other Topics Concern   Not on file  Social History Narrative   ** Merged History Encounter **       Homeless at the time may smoke one cigarette a day   Social Drivers of Health   Tobacco Use: High Risk (05/21/2024)   Patient History    Smoking Tobacco Use: Some Days    Smokeless Tobacco Use: Never    Passive Exposure: Not on file  Financial Resource Strain: High Risk (04/12/2024)   Overall Financial Resource Strain (CARDIA)    Difficulty of Paying Living Expenses: Hard  Food Insecurity: Food Insecurity Present (06/04/2024)   Epic    Worried About Programme Researcher, Broadcasting/film/video in the Last Year: Sometimes true    Ran Out of Food in the Last Year: Sometimes true  Transportation Needs: Unmet Transportation Needs (06/04/2024)   Epic    Lack of Transportation (Medical): Yes    Lack of Transportation (Non-Medical): Yes  Physical Activity: Not on file  Stress: Stress Concern Present (06/04/2024)   [employer] of Occupational Health -  Occupational Stress Questionnaire    Feeling of Stress: To some extent  Social Connections: Not on file  Intimate Partner Violence: Not At Risk (04/02/2024)   Epic    Fear of Current or Ex-Partner: No    Emotionally Abused: No    Physically Abused: No    Sexually Abused: No  Depression (PHQ2-9): Medium Risk (06/04/2024)   Depression (PHQ2-9)    PHQ-2 Score: 9  Alcohol Screen: Not on file  Housing: High Risk (06/04/2024)   Epic    Unable to Pay for Housing in the Last Year: Yes    Number of Times Moved in the Last Year: 1    Homeless in the Last Year: Yes  Utilities: At Risk (06/04/2024)   Epic    Threatened with loss of utilities: Yes  Health Literacy: Adequate Health  Literacy (01/03/2024)   B1300 Health Literacy    Frequency of need for help with medical instructions: Rarely    Physical Exam      Future Appointments  Date Time Provider Department Center  06/20/2024  3:30 PM [facility] PA/NP [facility] None  07/09/2024  9:30 AM Forrest, Ozell RAMAN, LCSW [facility] None          [1]  Current Outpatient Medications:    albuterol  (VENTOLIN  HFA) 108 (90 Base) MCG/ACT inhaler, Inhale 1-2 puffs into the lungs every 6 (six) hours as needed for wheezing or shortness of breath., Disp: 18 g, Rfl: 1   albuterol  (VENTOLIN  HFA) 108 (90 Base) MCG/ACT inhaler, Inhale 2 puffs into the lungs every 6 (six) hours as needed for wheezing or shortness of breath., Disp: 6.7 g, Rfl: 0   aspirin  EC 81 MG tablet, Take 1 tablet (81 mg total) by mouth daily. Swallow whole., Disp: 30 tablet, Rfl: 12   clotrimazole -betamethasone  (LOTRISONE ) cream, Apply 1 Application topically 2 (two) times daily., Disp: 45 g, Rfl: 1   digoxin  (LANOXIN ) 0.125 MG tablet, Take 1 tablet (0.125 mg total) by mouth daily., Disp: 90 tablet, Rfl: 1   empagliflozin  (JARDIANCE ) 10 MG TABS tablet, Take 1 tablet (10 mg total) by mouth daily before breakfast., Disp: 90 tablet, Rfl: 1   furosemide  (LASIX ) 40 MG tablet, Take 1 tablet (40 mg total) by mouth daily., Disp: 90 tablet, Rfl: 1   metoprolol  succinate (TOPROL -XL) 25 MG 24 hr tablet, Take 1/2 tablet (12.5 mg total) by mouth daily., Disp: 45 tablet, Rfl: 1   rosuvastatin  (CRESTOR ) 20 MG tablet, Take 1 tablet (20 mg total) by mouth daily., Disp: 90 tablet, Rfl: 1   sacubitril -valsartan  (ENTRESTO ) 24-26 MG, Take 1 tablet by mouth 2 (two) times daily., Disp: 60 tablet, Rfl: 3   spironolactone  (ALDACTONE ) 25 MG tablet, Take 1 tablet (25 mg total) by mouth at bedtime., Disp: 30 tablet, Rfl: 6   aspirin  EC 81 MG tablet, Take 1 tablet (81 mg total) by mouth daily. Swallow whole. (Patient not taking: Reported on 06/12/2024), Disp: 30 tablet, Rfl: 12   cyclobenzaprine   (FLEXERIL ) 10 MG tablet, Take 10 mg by mouth 2 (two) times daily as needed. (Patient not taking: Reported on 06/12/2024), Disp: , Rfl:    hydrOXYzine  (ATARAX ) 10 MG tablet, Take 1 tablet (10 mg total) by mouth 3 (three) times daily as needed. (Patient not taking: Reported on 06/12/2024), Disp: 30 tablet, Rfl: 0   predniSONE  (DELTASONE ) 20 MG tablet, Take 2 tablets (40 mg total) by mouth daily with breakfast. (Patient not taking: Reported on 06/12/2024), Disp: 4 tablet, Rfl: 0 [2]  Allergies Allergen Reactions  Shrimp [Shellfish Allergy] Anaphylaxis and Itching    ALL SHELLFISH   Shellfish Allergy Itching

## 2024-06-13 MED FILL — Rosuvastatin Calcium Tab 20 MG: ORAL | 30 days supply | Qty: 30 | Fill #1 | Status: CN

## 2024-06-13 MED FILL — Metoprolol Succinate Tab ER 24HR 25 MG (Tartrate Equiv): ORAL | 30 days supply | Qty: 15 | Fill #1 | Status: CN

## 2024-06-17 ENCOUNTER — Telehealth (HOSPITAL_COMMUNITY): Payer: Self-pay | Admitting: Licensed Clinical Social Worker

## 2024-06-17 NOTE — Telephone Encounter (Signed)
 DDS contacted CSW coworker Marit to inquire about patients status as he has not returned any communication with her.  CSW attempted to call pt to discuss but unable to reach- left VM.  Provided case worker information to [employer] to take out to patient in person when seen next.  Will continue to follow and assist as needed  Andriette HILARIO Leech, LCSW Clinical Social Worker [facility] Desk#: [phone] Cell#: [phone]

## 2024-06-20 ENCOUNTER — Other Ambulatory Visit: Payer: Self-pay

## 2024-06-20 ENCOUNTER — Ambulatory Visit (HOSPITAL_COMMUNITY)
Admission: RE | Admit: 2024-06-20 | Discharge: 2024-06-20 | Disposition: A | Discharge status: Home / Self Care | Source: Ambulatory Visit | Attending: Cardiology | Admitting: Cardiology

## 2024-06-20 ENCOUNTER — Ambulatory Visit (HOSPITAL_COMMUNITY): Payer: Self-pay | Admitting: Cardiology

## 2024-06-20 ENCOUNTER — Other Ambulatory Visit (HOSPITAL_COMMUNITY): Payer: Self-pay | Admitting: Emergency Medicine

## 2024-06-20 ENCOUNTER — Telehealth (HOSPITAL_COMMUNITY): Payer: Self-pay

## 2024-06-20 ENCOUNTER — Other Ambulatory Visit (HOSPITAL_COMMUNITY): Payer: Self-pay

## 2024-06-20 ENCOUNTER — Encounter (HOSPITAL_COMMUNITY): Payer: Self-pay

## 2024-06-20 VITALS — BP 118/77 | HR 88 | Wt 138.6 lb

## 2024-06-20 DIAGNOSIS — Z59868 Other specified financial insecurity: Secondary | ICD-10-CM | POA: Insufficient documentation

## 2024-06-20 DIAGNOSIS — I447 Left bundle-branch block, unspecified: Secondary | ICD-10-CM | POA: Insufficient documentation

## 2024-06-20 DIAGNOSIS — Z7984 Long term (current) use of oral hypoglycemic drugs: Secondary | ICD-10-CM | POA: Insufficient documentation

## 2024-06-20 DIAGNOSIS — I2510 Atherosclerotic heart disease of native coronary artery without angina pectoris: Secondary | ICD-10-CM | POA: Insufficient documentation

## 2024-06-20 DIAGNOSIS — Z79899 Other long term (current) drug therapy: Secondary | ICD-10-CM | POA: Insufficient documentation

## 2024-06-20 DIAGNOSIS — N1832 Chronic kidney disease, stage 3b: Secondary | ICD-10-CM | POA: Insufficient documentation

## 2024-06-20 DIAGNOSIS — I428 Other cardiomyopathies: Secondary | ICD-10-CM | POA: Diagnosis not present

## 2024-06-20 DIAGNOSIS — F17210 Nicotine dependence, cigarettes, uncomplicated: Secondary | ICD-10-CM | POA: Insufficient documentation

## 2024-06-20 DIAGNOSIS — I5022 Chronic systolic (congestive) heart failure: Secondary | ICD-10-CM | POA: Diagnosis not present

## 2024-06-20 DIAGNOSIS — I081 Rheumatic disorders of both mitral and tricuspid valves: Secondary | ICD-10-CM | POA: Insufficient documentation

## 2024-06-20 DIAGNOSIS — Z556 Problems related to health literacy: Secondary | ICD-10-CM | POA: Diagnosis not present

## 2024-06-20 LAB — BASIC METABOLIC PANEL WITH GFR
Anion gap: 11 (ref 5–15)
BUN: 22 mg/dL — ABNORMAL HIGH (ref 6–20)
CO2: 28 mmol/L (ref 22–32)
Calcium: 9 mg/dL (ref 8.9–10.3)
Chloride: 104 mmol/L (ref 98–111)
Creatinine, Ser: 1.48 mg/dL — ABNORMAL HIGH (ref 0.61–1.24)
GFR, Estimated: 54 mL/min — ABNORMAL LOW (ref >60)
Glucose, Bld: 97 mg/dL (ref 70–99)
Potassium: 4.5 mmol/L (ref 3.5–5.1)
Sodium: 143 mmol/L (ref 135–145)

## 2024-06-20 LAB — DIGOXIN LEVEL: Digoxin Level: 0.9 ng/mL (ref 0.8–2.0)

## 2024-06-20 LAB — PRO BRAIN NATRIURETIC PEPTIDE: Pro Brain Natriuretic Peptide: 7627 pg/mL — ABNORMAL HIGH (ref <300.0)

## 2024-06-20 MED ORDER — METOPROLOL SUCCINATE ER 25 MG PO TB24
25.0000 mg | ORAL_TABLET | Freq: Every evening | ORAL | 3 refills | Status: DC
  Filled 2024-06-20: qty 90, 90d supply, fill #0

## 2024-06-20 NOTE — Patient Instructions (Addendum)
 CHANGE Metoprolol  to 25 mg nightly  Labs done today, your results will be available in MyChart, we will contact you for abnormal readings.  Your physician has requested that you have an echocardiogram. Echocardiography is a painless test that uses sound waves to create images of your heart. It provides your doctor with information about the size and shape of your heart and how well your hearts chambers and valves are working. This procedure takes approximately one hour. There are no restrictions for this procedure. Please do NOT wear cologne, perfume, aftershave, or lotions (deodorant is allowed). Please arrive 15 minutes prior to your appointment time.  Please note: We ask at that you not bring children with you during ultrasound (echo/ vascular) testing. Due to room size and safety concerns, children are not allowed in the ultrasound rooms during exams. Our front office staff cannot provide observation of children in our lobby area while testing is being conducted. An adult accompanying a patient to their appointment will only be allowed in the ultrasound room at the discretion of the ultrasound technician under special circumstances. We apologize for any inconvenience.  Your physician recommends that you schedule a follow-up appointment in: 2 months.  If you have any questions or concerns before your next appointment please send us  a message through [place] or call our office at [phone].    TO LEAVE A MESSAGE FOR THE NURSE SELECT OPTION 2, PLEASE LEAVE A MESSAGE INCLUDING: YOUR NAME DATE OF BIRTH CALL BACK NUMBER REASON FOR CALL**this is important as we prioritize the call backs  YOU WILL RECEIVE A CALL BACK THE SAME DAY AS LONG AS YOU CALL BEFORE 4:00 PM  At the [facility], you and your health needs are our priority. As part of our continuing mission to provide you with exceptional heart care, we have created designated Provider Care Teams. These Care Teams include  your primary Cardiologist (physician) and Advanced Practice Providers (APPs- Physician Assistants and Nurse Practitioners) who all work together to provide you with the care you need, when you need it.   You may see any of the following providers on your designated Care Team at your next follow up: Dr Toribio Fuel Dr Ezra Shuck Dr. Morene Brownie Greig Mosses, NP Caffie Shed, [place] [facility], [place] Beckey Coe, NP [name], NP Ellouise Class, NP Tinnie Redman, PharmD Jaun Bash, PharmD   Please be sure to bring in all your medications bottles to every appointment.    Thank you for choosing [facility]

## 2024-06-20 NOTE — Progress Notes (Signed)
 [facility] Note  PCP: Celestia Rosaline SQUIBB, NP HF Cardiologist: Dr. Cherrie  HPI: Jesse Key is a 58 y.o. male w/ no prior routine medical care until recently. History of tobacco and alcohol use, and chronic systolic heart failure. Note he has another chart marked for merge.   Admitted 5/25 with new acute systolic heart failure. Echo showed EF < 20%, normal RV, global HK, mid-mod MR. Diuresed well with IV lasix  and he was started on GDMT. R/LHC showed non-obstructive CAD, EF 25%, well compensated filling pressures with moderate to severely reduced CO (PCW 7, CI 2.07). cMRI showed LVEF 18%, mod dilated LV with septal-lateral dyssynchrony consistent with LBBB and diffuse severe HK. Normal RV, RVEF 35%. Mild-mod MR. Concern for LBBB CM. Discharged, weight 126.5 lbs.   Patient presented to clinic 9/29 for routine follow-up and was sent to the ED to progressive HF symptoms including dyspnea at rest. Symptoms started after contracting COVID 2 weeks prior. During that time he had stopped taking some of his meds and was drinking a lot of fluid. BNP > 4500. He was admitted and diuresed by Advanced Heart Failure service. GDMT titrated.  He returns today for heart failure follow up with Dede, Paramedicine. Overall feeling well. Feeling much better after leaving [employer], was very stressful for him. No SOB, CP, or dizziness. Rare ETOH use, no tobacco. Taking all his medications as prescribed.   ROS: All systems negative except as listed in HPI, PMH and Problem List.  SH:  Social History   Socioeconomic History   Marital status: Single    Spouse name: Not on file   Number of children: Not on file   Years of education: Not on file   Highest education level: Not on file  Occupational History   Not on file  Tobacco Use   Smoking status: Some Days    Current packs/day: 0.25    Types: Cigarettes   Smokeless tobacco: Never  Vaping Use   Vaping status: Never Used   Substance and Sexual Activity   Alcohol use: Yes    Comment: weekends only   Drug use: No   Sexual activity: Not on file  Other Topics Concern   Not on file  Social History Narrative   ** Merged History Encounter **       Homeless at the time may smoke one cigarette a day   Social Drivers of Health   Tobacco Use: High Risk (05/21/2024)   Patient History    Smoking Tobacco Use: Some Days    Smokeless Tobacco Use: Never    Passive Exposure: Not on file  Financial Resource Strain: High Risk (04/12/2024)   Overall Financial Resource Strain (CARDIA)    Difficulty of Paying Living Expenses: Hard  Food Insecurity: Food Insecurity Present (06/04/2024)   Epic    Worried About Programme Researcher, Broadcasting/film/video in the Last Year: Sometimes true    Ran Out of Food in the Last Year: Sometimes true  Transportation Needs: Unmet Transportation Needs (06/04/2024)   Epic    Lack of Transportation (Medical): Yes    Lack of Transportation (Non-Medical): Yes  Physical Activity: Not on file  Stress: Stress Concern Present (06/04/2024)   [employer] of Occupational Health - Occupational Stress Questionnaire    Feeling of Stress: To some extent  Social Connections: Not on file  Intimate Partner Violence: Not At Risk (04/02/2024)   Epic    Fear of Current or Ex-Partner: No  Emotionally Abused: No    Physically Abused: No    Sexually Abused: No  Depression (PHQ2-9): Medium Risk (06/04/2024)   Depression (PHQ2-9)    PHQ-2 Score: 9  Alcohol Screen: Not on file  Housing: High Risk (06/04/2024)   Epic    Unable to Pay for Housing in the Last Year: Yes    Number of Times Moved in the Last Year: 1    Homeless in the Last Year: Yes  Utilities: At Risk (06/04/2024)   Epic    Threatened with loss of utilities: Yes  Health Literacy: Adequate Health Literacy (01/03/2024)   B1300 Health Literacy    Frequency of need for help with medical instructions: Rarely    FH:  Family History  Problem Relation Age of  Onset   Diabetes Mother    Hypertension Mother    Diabetes Father    Hypertension Father     Past Medical History:  Diagnosis Date   Asthma    Shellfish allergy     Current Outpatient Medications  Medication Sig Dispense Refill   albuterol  (VENTOLIN  HFA) 108 (90 Base) MCG/ACT inhaler Inhale 2 puffs into the lungs every 6 (six) hours as needed for wheezing or shortness of breath. 6.7 g 0   aspirin  EC 81 MG tablet Take 1 tablet (81 mg total) by mouth daily. Swallow whole. (Patient not taking: Reported on 06/20/2024) 30 tablet 12   clotrimazole -betamethasone  (LOTRISONE ) cream Apply 1 Application topically 2 (two) times daily. 45 g 1   digoxin  (LANOXIN ) 0.125 MG tablet Take 1 tablet (0.125 mg total) by mouth daily. 90 tablet 1   empagliflozin  (JARDIANCE ) 10 MG TABS tablet Take 1 tablet (10 mg total) by mouth daily before breakfast. 90 tablet 1   furosemide  (LASIX ) 40 MG tablet Take 1 tablet (40 mg total) by mouth daily. 90 tablet 1   rosuvastatin  (CRESTOR ) 20 MG tablet Take 1 tablet (20 mg total) by mouth daily. 90 tablet 1   sacubitril -valsartan  (ENTRESTO ) 24-26 MG Take 1 tablet by mouth 2 (two) times daily. 60 tablet 3   spironolactone  (ALDACTONE ) 25 MG tablet Take 1 tablet (25 mg total) by mouth at bedtime. 30 tablet 6   albuterol  (VENTOLIN  HFA) 108 (90 Base) MCG/ACT inhaler Inhale 1-2 puffs into the lungs every 6 (six) hours as needed for wheezing or shortness of breath. 18 g 1   aspirin  EC 81 MG tablet Take 1 tablet (81 mg total) by mouth daily. Swallow whole. 30 tablet 12   cyclobenzaprine  (FLEXERIL ) 10 MG tablet Take 10 mg by mouth 2 (two) times daily as needed. (Patient not taking: Reported on 06/20/2024)     hydrOXYzine  (ATARAX ) 10 MG tablet Take 1 tablet (10 mg total) by mouth 3 (three) times daily as needed. (Patient not taking: Reported on 06/20/2024) 30 tablet 0   metoprolol  succinate (TOPROL -XL) 25 MG 24 hr tablet Take 1 tablet (25 mg total) by mouth at bedtime. 90 tablet 3    predniSONE  (DELTASONE ) 20 MG tablet Take 2 tablets (40 mg total) by mouth daily with breakfast. (Patient not taking: Reported on 06/20/2024) 4 tablet 0   No current facility-administered medications for this encounter.   Vitals:   06/20/24 1443  BP: 118/77  Pulse: 88  SpO2: 99%  Weight: 62.9 kg (138 lb 9.6 oz)    Filed Weights   06/20/24 1443  Weight: 62.9 kg (138 lb 9.6 oz)   PHYSICAL EXAM: General: Well appearing. No distress  Cardiac: JVP flat. No murmurs  Extremities:  Warm and dry.  No edema.  Neuro: A&O x3. Affect pleasant.   ASSESSMENT & PLAN:  1. Chronic systolic CHF: Nonischemic cardiomyopathy. Echo in 5/25 showed EF < 20%, RV nl, global HK, mid-mod MR. RHC/LHC in 5/25 showed non-obstructive CAD, well-compensated filling pressures with CI 2.07.  Cardiac MRI in 5/25 showed LV EF 18%, RV EF 35%, nonspecific RV insertion site LGE.  Echo 9/25: EF 15-20%, RV mildly reduced, RVSP 55 mmHg, severe MR, severe TR, dilated IVC.  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  - NYHA II, improving. Euvolemic on exam. - continue Lasix  40 mg daily. BMET/BNP today - continue digoxin  0.125 daily. Check digoxin  level - continue Jardiance  10 daily - increase Toprol  XL 25 daily at bedtime - continue spironolactone  to 12.5 daily at bedtime, Cr bumped with 25mg   - continue Entresto  24/26 bid - compliance much improved with Paramedicine. He finds Dede, paramedic very helpful. - repeat echo in 2 months, if EF remains <35% will need ICD, with LBBB may be candidate for CRT-D  2. LBBB - QRS only 150 ms on last ECG  3. CKD stage 3b: Baseline ~1.5-1.6 - Last Cr up at 1.87. - Labs today  4. 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   - No chest pain - Continue ASA, statin.   5. SDOH:  - poor health literacy - Enrolled in paramedicine, compliance much improved - May eventually need to consider disability; currently working at [employer]   Follow up in 2 months with  Dr. Cherrie + echo  Laithan Conchas, NP 06/20/2024

## 2024-06-20 NOTE — Telephone Encounter (Signed)
 Called to confirm/remind patient of their appointment at the [facility] on 06/20/24.   Appointment:   [] Confirmed  [x] Left mess   [] No answer/No voice mail  [] VM Full/unable to leave message  [] Phone not in service  And to bring in all medications and/or complete list.

## 2024-06-20 NOTE — Progress Notes (Signed)
 Paramedicine Encounter   Patient ID: Jesse Key , male,   DOB: 1966-07-04,58 y.o.,  MRN: [id number]   Met patient in clinic today with provider.   Tzphyu@ clinic-138.6lb B/P-120/70 P-88   SP02-99%   Med changes today (if any) : Increased Metoprolol  to 25mg . daily  Mary Sharps, EMT-Paramedic [phone] 06/28/2024

## 2024-06-23 ENCOUNTER — Other Ambulatory Visit: Payer: Self-pay

## 2024-06-23 ENCOUNTER — Inpatient Hospital Stay (HOSPITAL_COMMUNITY)
Admission: EM | Admit: 2024-06-23 | Discharge: 2024-06-28 | Disposition: A | Discharge status: Home / Self Care | Attending: Internal Medicine | Admitting: Internal Medicine

## 2024-06-23 ENCOUNTER — Encounter (HOSPITAL_COMMUNITY): Payer: Self-pay

## 2024-06-23 ENCOUNTER — Emergency Department (HOSPITAL_COMMUNITY)

## 2024-06-23 DIAGNOSIS — Z91199 Patient's noncompliance with other medical treatment and regimen due to unspecified reason: Secondary | ICD-10-CM

## 2024-06-23 DIAGNOSIS — I509 Heart failure, unspecified: Secondary | ICD-10-CM

## 2024-06-23 DIAGNOSIS — F1490 Cocaine use, unspecified, uncomplicated: Secondary | ICD-10-CM | POA: Diagnosis present

## 2024-06-23 DIAGNOSIS — Z91013 Allergy to seafood: Secondary | ICD-10-CM

## 2024-06-23 DIAGNOSIS — Z8616 Personal history of COVID-19: Secondary | ICD-10-CM

## 2024-06-23 DIAGNOSIS — Z7982 Long term (current) use of aspirin: Secondary | ICD-10-CM

## 2024-06-23 DIAGNOSIS — J9811 Atelectasis: Secondary | ICD-10-CM | POA: Diagnosis present

## 2024-06-23 DIAGNOSIS — Z91138 Patient's unintentional underdosing of medication regimen for other reason: Secondary | ICD-10-CM

## 2024-06-23 DIAGNOSIS — I5082 Biventricular heart failure: Secondary | ICD-10-CM | POA: Diagnosis present

## 2024-06-23 DIAGNOSIS — I2489 Other forms of acute ischemic heart disease: Secondary | ICD-10-CM | POA: Diagnosis present

## 2024-06-23 DIAGNOSIS — I428 Other cardiomyopathies: Secondary | ICD-10-CM | POA: Diagnosis present

## 2024-06-23 DIAGNOSIS — Z833 Family history of diabetes mellitus: Secondary | ICD-10-CM

## 2024-06-23 DIAGNOSIS — E8770 Fluid overload, unspecified: Secondary | ICD-10-CM | POA: Diagnosis not present

## 2024-06-23 DIAGNOSIS — R0602 Shortness of breath: Principal | ICD-10-CM

## 2024-06-23 DIAGNOSIS — I110 Hypertensive heart disease with heart failure: Secondary | ICD-10-CM | POA: Diagnosis not present

## 2024-06-23 DIAGNOSIS — E785 Hyperlipidemia, unspecified: Secondary | ICD-10-CM | POA: Diagnosis present

## 2024-06-23 DIAGNOSIS — E861 Hypovolemia: Secondary | ICD-10-CM | POA: Diagnosis not present

## 2024-06-23 DIAGNOSIS — E875 Hyperkalemia: Secondary | ICD-10-CM | POA: Diagnosis present

## 2024-06-23 DIAGNOSIS — F17210 Nicotine dependence, cigarettes, uncomplicated: Secondary | ICD-10-CM | POA: Diagnosis present

## 2024-06-23 DIAGNOSIS — F1010 Alcohol abuse, uncomplicated: Secondary | ICD-10-CM | POA: Diagnosis present

## 2024-06-23 DIAGNOSIS — M109 Gout, unspecified: Secondary | ICD-10-CM | POA: Diagnosis present

## 2024-06-23 DIAGNOSIS — I2510 Atherosclerotic heart disease of native coronary artery without angina pectoris: Secondary | ICD-10-CM | POA: Diagnosis present

## 2024-06-23 DIAGNOSIS — Z5901 Sheltered homelessness: Secondary | ICD-10-CM

## 2024-06-23 DIAGNOSIS — I130 Hypertensive heart and chronic kidney disease with heart failure and stage 1 through stage 4 chronic kidney disease, or unspecified chronic kidney disease: Principal | ICD-10-CM | POA: Diagnosis present

## 2024-06-23 DIAGNOSIS — N179 Acute kidney failure, unspecified: Secondary | ICD-10-CM | POA: Diagnosis not present

## 2024-06-23 DIAGNOSIS — Z87892 Personal history of anaphylaxis: Secondary | ICD-10-CM

## 2024-06-23 DIAGNOSIS — Z556 Problems related to health literacy: Secondary | ICD-10-CM

## 2024-06-23 DIAGNOSIS — I5021 Acute systolic (congestive) heart failure: Secondary | ICD-10-CM

## 2024-06-23 DIAGNOSIS — E872 Acidosis, unspecified: Secondary | ICD-10-CM | POA: Diagnosis not present

## 2024-06-23 DIAGNOSIS — I081 Rheumatic disorders of both mitral and tricuspid valves: Secondary | ICD-10-CM | POA: Diagnosis present

## 2024-06-23 DIAGNOSIS — R609 Edema, unspecified: Secondary | ICD-10-CM | POA: Diagnosis not present

## 2024-06-23 DIAGNOSIS — T50916A Underdosing of multiple unspecified drugs, medicaments and biological substances, initial encounter: Secondary | ICD-10-CM | POA: Diagnosis present

## 2024-06-23 DIAGNOSIS — R079 Chest pain, unspecified: Secondary | ICD-10-CM | POA: Diagnosis not present

## 2024-06-23 DIAGNOSIS — R000 Tachycardia, unspecified: Secondary | ICD-10-CM | POA: Diagnosis not present

## 2024-06-23 DIAGNOSIS — I447 Left bundle-branch block, unspecified: Secondary | ICD-10-CM | POA: Diagnosis present

## 2024-06-23 DIAGNOSIS — N1831 Chronic kidney disease, stage 3a: Secondary | ICD-10-CM | POA: Diagnosis present

## 2024-06-23 DIAGNOSIS — J811 Chronic pulmonary edema: Secondary | ICD-10-CM | POA: Diagnosis not present

## 2024-06-23 DIAGNOSIS — D72828 Other elevated white blood cell count: Secondary | ICD-10-CM | POA: Diagnosis present

## 2024-06-23 DIAGNOSIS — I700 Atherosclerosis of aorta: Secondary | ICD-10-CM | POA: Diagnosis present

## 2024-06-23 DIAGNOSIS — I10 Essential (primary) hypertension: Secondary | ICD-10-CM | POA: Diagnosis present

## 2024-06-23 DIAGNOSIS — R0989 Other specified symptoms and signs involving the circulatory and respiratory systems: Secondary | ICD-10-CM | POA: Diagnosis not present

## 2024-06-23 DIAGNOSIS — J45909 Unspecified asthma, uncomplicated: Secondary | ICD-10-CM

## 2024-06-23 DIAGNOSIS — T501X5A Adverse effect of loop [high-ceiling] diuretics, initial encounter: Secondary | ICD-10-CM | POA: Diagnosis not present

## 2024-06-23 DIAGNOSIS — I5023 Acute on chronic systolic (congestive) heart failure: Secondary | ICD-10-CM | POA: Diagnosis present

## 2024-06-23 DIAGNOSIS — Z5982 Transportation insecurity: Secondary | ICD-10-CM

## 2024-06-23 DIAGNOSIS — J4489 Other specified chronic obstructive pulmonary disease: Secondary | ICD-10-CM | POA: Diagnosis present

## 2024-06-23 DIAGNOSIS — Z5902 Unsheltered homelessness: Secondary | ICD-10-CM

## 2024-06-23 DIAGNOSIS — Z7984 Long term (current) use of oral hypoglycemic drugs: Secondary | ICD-10-CM

## 2024-06-23 DIAGNOSIS — T380X5A Adverse effect of glucocorticoids and synthetic analogues, initial encounter: Secondary | ICD-10-CM | POA: Diagnosis present

## 2024-06-23 DIAGNOSIS — Z59868 Other specified financial insecurity: Secondary | ICD-10-CM

## 2024-06-23 DIAGNOSIS — Z79899 Other long term (current) drug therapy: Secondary | ICD-10-CM

## 2024-06-23 DIAGNOSIS — Z5941 Food insecurity: Secondary | ICD-10-CM

## 2024-06-23 DIAGNOSIS — Z8249 Family history of ischemic heart disease and other diseases of the circulatory system: Secondary | ICD-10-CM

## 2024-06-23 LAB — BASIC METABOLIC PANEL WITH GFR
Anion gap: 11 (ref 5–15)
BUN: 17 mg/dL (ref 6–20)
CO2: 22 mmol/L (ref 22–32)
Calcium: 8.3 mg/dL — ABNORMAL LOW (ref 8.9–10.3)
Chloride: 106 mmol/L (ref 98–111)
Creatinine, Ser: 1.41 mg/dL — ABNORMAL HIGH (ref 0.61–1.24)
GFR, Estimated: 57 mL/min — ABNORMAL LOW
Glucose, Bld: 89 mg/dL (ref 70–99)
Potassium: 5.1 mmol/L (ref 3.5–5.1)
Sodium: 139 mmol/L (ref 135–145)

## 2024-06-23 LAB — CBC
HCT: 36.1 % — ABNORMAL LOW (ref 39.0–52.0)
Hemoglobin: 11.5 g/dL — ABNORMAL LOW (ref 13.0–17.0)
MCH: 24.1 pg — ABNORMAL LOW (ref 26.0–34.0)
MCHC: 31.9 g/dL (ref 30.0–36.0)
MCV: 75.7 fL — ABNORMAL LOW (ref 80.0–100.0)
Platelets: 251 K/uL (ref 150–400)
RBC: 4.77 MIL/uL (ref 4.22–5.81)
RDW: 17.4 % — ABNORMAL HIGH (ref 11.5–15.5)
WBC: 13.2 K/uL — ABNORMAL HIGH (ref 4.0–10.5)
nRBC: 0 % (ref 0.0–0.2)

## 2024-06-23 LAB — PRO BRAIN NATRIURETIC PEPTIDE: Pro Brain Natriuretic Peptide: 11603 pg/mL — ABNORMAL HIGH

## 2024-06-23 LAB — TROPONIN T, HIGH SENSITIVITY: Troponin T High Sensitivity: 125 ng/L (ref 0–19)

## 2024-06-23 MED ORDER — HEPARIN (PORCINE) 25000 UT/250ML-% IV SOLN
750.0000 [IU]/h | INTRAVENOUS | Status: DC
  Administered 2024-06-24: 750 [IU]/h via INTRAVENOUS
  Filled 2024-06-23: qty 250

## 2024-06-23 MED ORDER — IOHEXOL 350 MG/ML SOLN
75.0000 mL | Freq: Once | INTRAVENOUS | Status: AC | PRN
  Administered 2024-06-23: 75 mL via INTRAVENOUS

## 2024-06-23 MED ORDER — FUROSEMIDE 10 MG/ML IJ SOLN
80.0000 mg | Freq: Once | INTRAMUSCULAR | Status: AC
  Administered 2024-06-24: 80 mg via INTRAVENOUS
  Filled 2024-06-23: qty 8

## 2024-06-23 MED ORDER — FUROSEMIDE 10 MG/ML IJ SOLN: 60.0000 mg | Freq: Once | INTRAMUSCULAR | Status: DC

## 2024-06-23 MED ORDER — HEPARIN BOLUS VIA INFUSION
3000.0000 [IU] | Freq: Once | INTRAVENOUS | Status: AC
  Administered 2024-06-24: 3000 [IU] via INTRAVENOUS
  Filled 2024-06-23: qty 3000

## 2024-06-23 NOTE — Progress Notes (Signed)
 PHARMACY - ANTICOAGULATION CONSULT NOTE  Pharmacy Consult for heparin  Indication: chest pain/ACS  Allergies[1]  Patient Measurements: Height: 5' 5 (165.1 cm) Weight: 62.9 kg (138 lb 10.7 oz) IBW/kg (Calculated) : 61.5 HEPARIN  DW (KG): 62.9  Vital Signs: Temp: 98 F (36.7 C) (12/21 2217) Temp Source: Oral (12/21 2217) BP: 125/88 (12/21 2220) Pulse Rate: 110 (12/21 2221)  Labs: Recent Labs    06/23/24 2241  HGB 11.5*  HCT 36.1*  PLT 251  CREATININE 1.41*    Estimated Creatinine Clearance: 48.5 mL/min (A) (by C-G formula based on SCr of 1.41 mg/dL (H)).   Medical History: Past Medical History:  Diagnosis Date   Asthma    Shellfish allergy     Assessment: 58yo male c/o SOB and CP, troponin and BNP found to be elevated >> to begin heparin .  Goal of Therapy:  Heparin  level 0.3-0.7 units/ml Monitor platelets by anticoagulation protocol: Yes   Plan:  Heparin  3000 units IV bolus followed by infusion at 750 units/hr. Monitor heparin  levels and CBC.  Marvetta Dauphin, PharmD, BCPS  06/23/2024,11:33 PM      [1]  Allergies Allergen Reactions   Shrimp [Shellfish Allergy] Anaphylaxis and Itching    ALL SHELLFISH   Shellfish Allergy Itching

## 2024-06-23 NOTE — ED Provider Notes (Signed)
 " [place] EMERGENCY DEPARTMENT AT [facility] Provider Note   CSN: [id number] Arrival date & time: 06/23/24  2215     Patient presents with: Shortness of Breath   Jesse Key is a 58 y.o. male with history of asthma, shellfish allergy, EtOH abuse, elevated troponins, acute pulmonary edema, heart failure.  Presents to ED complaining of shortness of breath and chest pain.  States that at work yesterday noticed that he was having some shortness of breath, some chest pain located on the left side of his body without radiation.  States that the symptoms of progressively worsened since onset yesterday.  States that he had some orthopnea last night while trying to sleep.  Reports compliance on Lasix .  Denies any edema to bilateral lower extremities.  Recently saw heart failure clinic who adjusted some of his medications but he is unable to me which ones.  Denies any abdominal pain, nausea, vomiting.  States that yesterday at work he was having a hard time staying awake and felt like he was drifting to sleep.  Denies history of OSA.  Denies using his CPAP machine.  Denies drugs or alcohol.   Shortness of Breath      Prior to Admission medications  Medication Sig Start Date End Date Taking? Authorizing Provider  albuterol  (VENTOLIN  HFA) 108 (90 Base) MCG/ACT inhaler Inhale 1-2 puffs into the lungs every 6 (six) hours as needed for wheezing or shortness of breath. 03/13/24  Yes Smoot, Lauraine LABOR, PA-C  clotrimazole -betamethasone  (LOTRISONE ) cream Apply 1 Application topically 2 (two) times daily. 02/01/24  Yes Celestia Rosaline SQUIBB, NP  albuterol  (VENTOLIN  HFA) 108 (90 Base) MCG/ACT inhaler Inhale 2 puffs into the lungs every 6 (six) hours as needed for wheezing or shortness of breath. 11/25/23   Shitarev, Dimitry, MD  aspirin  EC 81 MG tablet Take 1 tablet (81 mg total) by mouth daily. Swallow whole. Patient not taking: Reported on 06/24/2024 03/13/24   Smoot, Sarah A, PA-C  aspirin  EC 81 MG  tablet Take 1 tablet (81 mg total) by mouth daily. Swallow whole. Patient not taking: Reported on 06/20/2024 12/06/23   Glena Harlene HERO, FNP  cyclobenzaprine  (FLEXERIL ) 10 MG tablet Take 10 mg by mouth 2 (two) times daily as needed. Patient not taking: Reported on 06/20/2024 11/18/23   [provider]  digoxin  (LANOXIN ) 0.125 MG tablet Take 1 tablet (0.125 mg total) by mouth daily. 05/23/24   Bensimhon, Toribio SAUNDERS, MD  empagliflozin  (JARDIANCE ) 10 MG TABS tablet Take 1 tablet (10 mg total) by mouth daily before breakfast. 05/17/24   Colletta Manuelita Garre, PA-C  furosemide  (LASIX ) 40 MG tablet Take 1 tablet (40 mg total) by mouth daily. 05/23/24   Bensimhon, Toribio SAUNDERS, MD  hydrOXYzine  (ATARAX ) 10 MG tablet Take 1 tablet (10 mg total) by mouth 3 (three) times daily as needed. Patient not taking: Reported on 06/20/2024 02/27/24   Celestia Rosaline SQUIBB, NP  metoprolol  succinate (TOPROL -XL) 25 MG 24 hr tablet Take 1 tablet (25 mg total) by mouth at bedtime. 06/20/24   Lee, Jordan, NP  predniSONE  (DELTASONE ) 20 MG tablet Take 2 tablets (40 mg total) by mouth daily with breakfast. Patient not taking: Reported on 06/20/2024 04/05/24   Arrien, Mauricio Daniel, MD  rosuvastatin  (CRESTOR ) 20 MG tablet Take 1 tablet (20 mg total) by mouth daily. 05/17/24   Colletta Manuelita Garre, PA-C  sacubitril -valsartan  (ENTRESTO ) 24-26 MG Take 1 tablet by mouth 2 (two) times daily. 06/07/24   Glena Harlene HERO, FNP  spironolactone  (ALDACTONE ) 25 MG tablet Take 1 tablet (25 mg total) by mouth at bedtime. 05/21/24   Lee, Jordan, NP    Allergies: Shrimp [shellfish allergy] and Shellfish allergy    Review of Systems  Respiratory:  Positive for shortness of breath.   All other systems reviewed and are negative.   Updated Vital Signs BP 108/61 (BP Location: Right Arm)   Pulse 100   Temp 98.6 F (37 C) (Oral)   Resp 20   Ht 5' 5 (1.651 m)   Wt 62.9 kg   SpO2 99%   BMI 23.08 kg/m   Physical Exam Vitals and  nursing note reviewed.  Constitutional:      General: He is not in acute distress.    Appearance: He is well-developed. He is ill-appearing.  HENT:     Head: Normocephalic and atraumatic.  Eyes:     Conjunctiva/sclera: Conjunctivae normal.  Cardiovascular:     Rate and Rhythm: Normal rate and regular rhythm.     Heart sounds: No murmur heard. Pulmonary:     Effort: Pulmonary effort is normal. No respiratory distress.     Breath sounds: Normal breath sounds.     Comments: Distant lung sounds Abdominal:     Palpations: Abdomen is soft.     Tenderness: There is no abdominal tenderness.  Musculoskeletal:        General: No swelling.     Cervical back: Neck supple.     Right lower leg: No edema.     Left lower leg: No edema.     Comments: No obvious lower extremity edema  Skin:    General: Skin is warm and dry.     Capillary Refill: Capillary refill takes less than 2 seconds.  Neurological:     Mental Status: He is alert.  Psychiatric:        Mood and Affect: Mood normal.     (all labs ordered are listed, but only abnormal results are displayed) Labs Reviewed  CBC - Abnormal; Notable for the following components:      Result Value   WBC 13.2 (*)    Hemoglobin 11.5 (*)    HCT 36.1 (*)    MCV 75.7 (*)    MCH 24.1 (*)    RDW 17.4 (*)    All other components within normal limits  BASIC METABOLIC PANEL WITH GFR - Abnormal; Notable for the following components:   Creatinine, Ser 1.41 (*)    Calcium  8.3 (*)    GFR, Estimated 57 (*)    All other components within normal limits  URINALYSIS, ROUTINE W REFLEX MICROSCOPIC - Abnormal; Notable for the following components:   Glucose, UA 100 (*)    Hgb urine dipstick SMALL (*)    Protein, ur 100 (*)    All other components within normal limits  PRO BRAIN NATRIURETIC PEPTIDE - Abnormal; Notable for the following components:   Pro Brain Natriuretic Peptide 11,603.0 (*)    All other components within normal limits  I-STAT VENOUS  BLOOD GAS, ED - Abnormal; Notable for the following components:   pCO2, Ven 43.1 (*)    pO2, Ven 186 (*)    Calcium , Ion 1.10 (*)    All other components within normal limits  TROPONIN T, HIGH SENSITIVITY - Abnormal; Notable for the following components:   Troponin T High Sensitivity 125 (*)    All other components within normal limits  URINALYSIS, MICROSCOPIC (REFLEX)  HEPARIN  LEVEL (UNFRACTIONATED)  DIGOXIN  LEVEL  TROPONIN T,  HIGH SENSITIVITY    EKG: EKG Interpretation Date/Time:  Sunday June 23 2024 22:23:09 EST Ventricular Rate:  109 PR Interval:  180 QRS Duration:  162 QT Interval:  353 QTC Calculation: 476 R Axis:   28  Text Interpretation: Sinus tachycardia Biatrial enlargement Left bundle branch block Confirmed by Griselda Norris [phone]) on 06/23/2024 11:39:58 PM  Radiology: CT Angio Chest PE W and/or Wo Contrast Result Date: 06/23/2024 CLINICAL DATA:  Shortness of breath EXAM: CT ANGIOGRAPHY CHEST WITH CONTRAST TECHNIQUE: Multidetector CT imaging of the chest was performed using the standard protocol during bolus administration of intravenous contrast. Multiplanar CT image reconstructions and MIPs were obtained to evaluate the vascular anatomy. RADIATION DOSE REDUCTION: This exam was performed according to the departmental dose-optimization program which includes automated exposure control, adjustment of the mA and/or kV according to patient size and/or use of iterative reconstruction technique. CONTRAST:  75mL OMNIPAQUE  IOHEXOL  350 MG/ML SOLN COMPARISON:  Chest x-ray from earlier in the same day. FINDINGS: Cardiovascular: Thoracic aorta demonstrates mild atherosclerotic calcifications without aneurysmal dilatation. Coronary calcifications are noted. The heart is enlarged in size. Pulmonary artery shows a normal branching pattern bilaterally. No intraluminal filling defect to suggest pulmonary embolism is noted. Mediastinum/Nodes: Thoracic inlet is within normal limits. No  hilar or mediastinal adenopathy is noted. The esophagus as visualized is within normal limits. Lungs/Pleura: Lungs are well aerated bilaterally. No focal infiltrate or sizable effusion is seen. A few tiny subpleural nodules are noted within the left upper lobe. These are stable in appearance from the prior exam. Upper Abdomen: Visualized upper abdomen is within normal limits. Musculoskeletal: Bony structures are within normal limits. Review of the MIP images confirms the above findings. IMPRESSION: No evidence of pulmonary emboli. A few tiny subpleural nodules are noted in the left upper lobe.No routine follow-up imaging is recommended per Fleischner Society Guidelines. Aortic Atherosclerosis (ICD10-I70.0). Electronically Signed   By: Oneil Devonshire M.D.   On: 06/23/2024 23:56   DG Chest Portable 1 View Result Date: 06/23/2024 EXAM: 1 VIEW XRAY OF THE CHEST 06/23/2024 10:45:00 PM COMPARISON: 04/01/2024 CLINICAL HISTORY: sob, cp, chf FINDINGS: LUNGS AND PLEURA: Pulmonary vascular congestion. Kerley B lines in the lower lungs compatible with interstitial edema. No focal pulmonary opacity. No pleural effusion. No pneumothorax. HEART AND MEDIASTINUM: Unchanged cardiomegaly. No acute abnormality of the mediastinal silhouette. BONES AND SOFT TISSUES: No acute osseous abnormality. IMPRESSION: 1. CHF with mild interstitial edema. Electronically signed by: Norman Gatlin MD 06/23/2024 10:57 PM EST RP Workstation: [id number]    Procedures   Medications Ordered in the ED  heparin  ADULT infusion 100 units/mL (25000 units/252mL) (750 Units/hr Intravenous New Bag/Given 06/24/24 0031)  LORazepam  (ATIVAN ) injection 0.5 mg (0.5 mg Intravenous Not Given 06/24/24 0012)  heparin  bolus via infusion 3,000 Units (3,000 Units Intravenous Bolus from Bag 06/24/24 0030)  iohexol  (OMNIPAQUE ) 350 MG/ML injection 75 mL (75 mLs Intravenous Contrast Given 06/23/24 2344)  furosemide  (LASIX ) injection 80 mg (80 mg Intravenous Given  06/24/24 0005)  ipratropium-albuterol  (DUONEB) 0.5-2.5 (3) MG/3ML nebulizer solution 3 mL (3 mLs Nebulization Given 06/24/24 0026)  methylPREDNISolone  sodium succinate (SOLU-MEDROL ) 125 mg/2 mL injection 125 mg (125 mg Intravenous Given 06/24/24 0026)  ipratropium-albuterol  (DUONEB) 0.5-2.5 (3) MG/3ML nebulizer solution 3 mL (3 mLs Nebulization Given 06/24/24 0111)    Clinical Course as of 06/24/24 0507  Mon Jun 24, 2024  0030 Patient placed on bipap however unable to tolerate and ripped it off [CG]    Clinical Course User Index [CG] Ruthell Lonni FALCON,  PA-C   Medical Decision Making Amount and/or Complexity of Data Reviewed Labs: ordered. Radiology: ordered.  Risk Prescription drug management. Decision regarding hospitalization.   58 year old male presents to ED for evaluation of shortness of breath, chest pain.  Patient with active history of CHF.  On exam, he is HD stable.  His lung sounds are clear bilaterally, no hypoxia.  He is tachycardic to 120.  He has no obvious edema to lower extremities bilaterally.  He has a soft and compressible abdomen.  Neurological examination is at baseline.  Will assess utilizing CBC, BMP, troponin x 2, BNP, i-STAT VBG due to somnolence, urinalysis, digoxin  level, chest x-ray, EKG.  Will also add on CTA as patient is tachycardic, complaining of chest pain and shortness of breath.  Patient CBC with leukocytosis 13.2, baseline hemoglobin 11.5.  Metabolic panel with baseline creatinine 1.4, GFR 57, anion gap 11, no electrolyte derangement.  Urinalysis unremarkable.  I-STAT VBG reassuring, no obvious evidence of acidosis or hypercarbia.  Patient troponin is elevated to 125 which is beyond his baseline however is tachycardic and favor demand ischemia at this time.  BNP elevated 11,603.  No obvious edema lower extremities.  Chest x-ray shows CHF with mild interstitial edema.  Patient reports compliance on Lasix .  Patient given 80 mg of Lasix  at this  time.  Patient went to CTA for scan.  CTA has been obtained and does not show evidence of PE.  Upon arrival back to the room, the patient is very tachypneic, very short of breath, stating that he is very short of breath and feels as though he cannot breathe.  Patient was placed on BiPAP at this time with good result.  Patient was able to tolerate this for about 30 to 45 minutes and then removed on his own.  Patient also given Solu-Medrol , DuoNeb.  Did discuss with cardiology, Dr. Napper, who does not favor volume overload as source of patient symptoms despite patient BNP being elevated.  He will consult on patient as needed.  At this time, have admitted patient to hospital.  Patient discussed with Dr. Debby.  Stable admission.   Final diagnoses:  Shortness of breath  Hypervolemia, unspecified hypervolemia type  Acute on chronic congestive heart failure, unspecified heart failure type [facility])    ED Discharge Orders     None          Ruthell Lonni FALCON, PA-C 06/24/24 0507    Francesca Elsie CROME, MD 06/24/24 1309  "

## 2024-06-23 NOTE — ED Triage Notes (Signed)
 Pt Bib [facility] from home d/t Sob and CP upon inspiration.  Onset 30 minutes - hx of CHF and feels same.  Has some LE edema.    BP 118/76 HR 102 99% RA Cbg 143  18 g LFA  LLLB on EKG

## 2024-06-23 NOTE — ED Provider Notes (Incomplete)
 " [place] EMERGENCY DEPARTMENT AT [facility] Provider Note   CSN: [id number] Arrival date & time: 06/23/24  2215     Patient presents with: Shortness of Breath   Jesse Key is a 58 y.o. male with history of asthma, shellfish allergy, EtOH abuse, elevated troponins, acute pulmonary edema, heart failure.  Presents to ED complaining of shortness of breath and chest pain.  States that at work yesterday noticed that he was having some shortness of breath, some chest pain located on the left side of his body without radiation.  States that the symptoms of progressively worsened since onset yesterday.  States that he had some orthopnea last night while trying to sleep.  Reports compliance on Lasix .  Denies any edema to bilateral lower extremities.  Recently saw heart failure clinic who adjusted some of his medications but he is unable to me which ones.  Denies any abdominal pain, nausea, vomiting.  States that yesterday at work he was having a hard time staying awake and felt like he was drifting to sleep.  Denies history of OSA.  Denies using his CPAP machine.  Denies drugs or alcohol.   Shortness of Breath      Prior to Admission medications  Medication Sig Start Date End Date Taking? Authorizing Provider  albuterol  (VENTOLIN  HFA) 108 (90 Base) MCG/ACT inhaler Inhale 1-2 puffs into the lungs every 6 (six) hours as needed for wheezing or shortness of breath. 03/13/24   Smoot, Lauraine LABOR, PA-C  albuterol  (VENTOLIN  HFA) 108 (90 Base) MCG/ACT inhaler Inhale 2 puffs into the lungs every 6 (six) hours as needed for wheezing or shortness of breath. 11/25/23   Shitarev, Dimitry, MD  aspirin  EC 81 MG tablet Take 1 tablet (81 mg total) by mouth daily. Swallow whole. 03/13/24   Smoot, Lauraine LABOR, PA-C  aspirin  EC 81 MG tablet Take 1 tablet (81 mg total) by mouth daily. Swallow whole. Patient not taking: Reported on 06/20/2024 12/06/23   Glena Harlene HERO, FNP  clotrimazole -betamethasone   (LOTRISONE ) cream Apply 1 Application topically 2 (two) times daily. 02/01/24   Celestia Rosaline SQUIBB, NP  cyclobenzaprine  (FLEXERIL ) 10 MG tablet Take 10 mg by mouth 2 (two) times daily as needed. Patient not taking: Reported on 06/20/2024 11/18/23   [provider]  digoxin  (LANOXIN ) 0.125 MG tablet Take 1 tablet (0.125 mg total) by mouth daily. 05/23/24   Bensimhon, Toribio SAUNDERS, MD  empagliflozin  (JARDIANCE ) 10 MG TABS tablet Take 1 tablet (10 mg total) by mouth daily before breakfast. 05/17/24   Colletta Manuelita Garre, PA-C  furosemide  (LASIX ) 40 MG tablet Take 1 tablet (40 mg total) by mouth daily. 05/23/24   Bensimhon, Toribio SAUNDERS, MD  hydrOXYzine  (ATARAX ) 10 MG tablet Take 1 tablet (10 mg total) by mouth 3 (three) times daily as needed. Patient not taking: Reported on 06/20/2024 02/27/24   Celestia Rosaline SQUIBB, NP  metoprolol  succinate (TOPROL -XL) 25 MG 24 hr tablet Take 1 tablet (25 mg total) by mouth at bedtime. 06/20/24   Lee, Jordan, NP  predniSONE  (DELTASONE ) 20 MG tablet Take 2 tablets (40 mg total) by mouth daily with breakfast. Patient not taking: Reported on 06/20/2024 04/05/24   Arrien, Mauricio Daniel, MD  rosuvastatin  (CRESTOR ) 20 MG tablet Take 1 tablet (20 mg total) by mouth daily. 05/17/24   Colletta Manuelita Garre, PA-C  sacubitril -valsartan  (ENTRESTO ) 24-26 MG Take 1 tablet by mouth 2 (two) times daily. 06/07/24   Glena Harlene HERO, FNP  spironolactone  (ALDACTONE ) 25 MG tablet Take  1 tablet (25 mg total) by mouth at bedtime. 05/21/24   Lee, Jordan, NP    Allergies: Shrimp [shellfish allergy] and Shellfish allergy    Review of Systems  Respiratory:  Positive for shortness of breath.     Updated Vital Signs BP 125/88   Pulse (!) 110   Temp 98 F (36.7 C) (Oral)   Resp 19   Ht 5' 5 (1.651 m)   Wt 62.9 kg   SpO2 100%   BMI 23.08 kg/m   Physical Exam  (all labs ordered are listed, but only abnormal results are displayed) Labs Reviewed  CBC  BASIC METABOLIC PANEL  WITH GFR  URINALYSIS, ROUTINE W REFLEX MICROSCOPIC  PRO BRAIN NATRIURETIC PEPTIDE  TROPONIN T, HIGH SENSITIVITY    EKG: None  Radiology: No results found.  {Document cardiac monitor, telemetry assessment procedure when appropriate:32947} Procedures   Medications Ordered in the ED - No data to display    {Click here for ABCD2, HEART and other calculators REFRESH Note before signing:1}                              Medical Decision Making Amount and/or Complexity of Data Reviewed Labs: ordered. Radiology: ordered.   ***  {Document critical care time when appropriate  Document review of labs and clinical decision tools ie [id number], etc  Document your independent review of radiology images and any outside records  Document your discussion with family members, caretakers and with consultants  Document social determinants of health affecting pt's care  Document your decision making why or why not admission, treatments were needed:32947:::1}   Final diagnoses:  None    ED Discharge Orders     None        "

## 2024-06-24 ENCOUNTER — Inpatient Hospital Stay (HOSPITAL_COMMUNITY)

## 2024-06-24 ENCOUNTER — Other Ambulatory Visit (HOSPITAL_COMMUNITY): Payer: Self-pay

## 2024-06-24 ENCOUNTER — Encounter (HOSPITAL_COMMUNITY): Payer: Self-pay | Admitting: Emergency Medicine

## 2024-06-24 DIAGNOSIS — I5021 Acute systolic (congestive) heart failure: Secondary | ICD-10-CM

## 2024-06-24 DIAGNOSIS — N1831 Chronic kidney disease, stage 3a: Secondary | ICD-10-CM | POA: Diagnosis not present

## 2024-06-24 DIAGNOSIS — E875 Hyperkalemia: Secondary | ICD-10-CM | POA: Diagnosis present

## 2024-06-24 DIAGNOSIS — F1010 Alcohol abuse, uncomplicated: Secondary | ICD-10-CM | POA: Diagnosis not present

## 2024-06-24 DIAGNOSIS — T380X5A Adverse effect of glucocorticoids and synthetic analogues, initial encounter: Secondary | ICD-10-CM | POA: Diagnosis present

## 2024-06-24 DIAGNOSIS — I447 Left bundle-branch block, unspecified: Secondary | ICD-10-CM | POA: Diagnosis present

## 2024-06-24 DIAGNOSIS — I2489 Other forms of acute ischemic heart disease: Secondary | ICD-10-CM

## 2024-06-24 DIAGNOSIS — I5082 Biventricular heart failure: Secondary | ICD-10-CM

## 2024-06-24 DIAGNOSIS — M109 Gout, unspecified: Secondary | ICD-10-CM | POA: Diagnosis present

## 2024-06-24 DIAGNOSIS — R079 Chest pain, unspecified: Secondary | ICD-10-CM

## 2024-06-24 DIAGNOSIS — I428 Other cardiomyopathies: Secondary | ICD-10-CM

## 2024-06-24 DIAGNOSIS — J441 Chronic obstructive pulmonary disease with (acute) exacerbation: Secondary | ICD-10-CM

## 2024-06-24 DIAGNOSIS — I5023 Acute on chronic systolic (congestive) heart failure: Secondary | ICD-10-CM | POA: Diagnosis not present

## 2024-06-24 DIAGNOSIS — E861 Hypovolemia: Secondary | ICD-10-CM | POA: Diagnosis not present

## 2024-06-24 DIAGNOSIS — E872 Acidosis, unspecified: Secondary | ICD-10-CM | POA: Diagnosis not present

## 2024-06-24 DIAGNOSIS — I509 Heart failure, unspecified: Secondary | ICD-10-CM | POA: Insufficient documentation

## 2024-06-24 DIAGNOSIS — Z5902 Unsheltered homelessness: Secondary | ICD-10-CM | POA: Diagnosis not present

## 2024-06-24 DIAGNOSIS — I700 Atherosclerosis of aorta: Secondary | ICD-10-CM | POA: Diagnosis present

## 2024-06-24 DIAGNOSIS — Z8616 Personal history of COVID-19: Secondary | ICD-10-CM | POA: Diagnosis not present

## 2024-06-24 DIAGNOSIS — F17210 Nicotine dependence, cigarettes, uncomplicated: Secondary | ICD-10-CM | POA: Diagnosis present

## 2024-06-24 DIAGNOSIS — I081 Rheumatic disorders of both mitral and tricuspid valves: Secondary | ICD-10-CM | POA: Diagnosis present

## 2024-06-24 DIAGNOSIS — J9811 Atelectasis: Secondary | ICD-10-CM | POA: Diagnosis present

## 2024-06-24 DIAGNOSIS — I130 Hypertensive heart and chronic kidney disease with heart failure and stage 1 through stage 4 chronic kidney disease, or unspecified chronic kidney disease: Secondary | ICD-10-CM | POA: Diagnosis present

## 2024-06-24 DIAGNOSIS — I2510 Atherosclerotic heart disease of native coronary artery without angina pectoris: Secondary | ICD-10-CM | POA: Diagnosis not present

## 2024-06-24 DIAGNOSIS — J4489 Other specified chronic obstructive pulmonary disease: Secondary | ICD-10-CM | POA: Diagnosis present

## 2024-06-24 DIAGNOSIS — E785 Hyperlipidemia, unspecified: Secondary | ICD-10-CM | POA: Diagnosis present

## 2024-06-24 DIAGNOSIS — Z5901 Sheltered homelessness: Secondary | ICD-10-CM | POA: Diagnosis not present

## 2024-06-24 DIAGNOSIS — J45909 Unspecified asthma, uncomplicated: Secondary | ICD-10-CM | POA: Diagnosis not present

## 2024-06-24 DIAGNOSIS — R0602 Shortness of breath: Secondary | ICD-10-CM | POA: Diagnosis present

## 2024-06-24 DIAGNOSIS — N179 Acute kidney failure, unspecified: Secondary | ICD-10-CM | POA: Diagnosis not present

## 2024-06-24 DIAGNOSIS — I10 Essential (primary) hypertension: Secondary | ICD-10-CM | POA: Diagnosis not present

## 2024-06-24 LAB — RESPIRATORY PANEL BY PCR

## 2024-06-24 LAB — URINALYSIS, W/ REFLEX TO CULTURE (INFECTION SUSPECTED)
Bilirubin Urine: NEGATIVE
Glucose, UA: NEGATIVE mg/dL
Ketones, ur: NEGATIVE mg/dL
Leukocytes,Ua: NEGATIVE
Nitrite: NEGATIVE
Protein, ur: 30 mg/dL — AB
Specific Gravity, Urine: 1.01 (ref 1.005–1.030)
pH: 7 (ref 5.0–8.0)

## 2024-06-24 LAB — URINALYSIS, ROUTINE W REFLEX MICROSCOPIC
Bilirubin Urine: NEGATIVE
Glucose, UA: 100 mg/dL — AB
Ketones, ur: NEGATIVE mg/dL
Leukocytes,Ua: NEGATIVE
Nitrite: NEGATIVE
Protein, ur: 100 mg/dL — AB
Specific Gravity, Urine: 1.01 (ref 1.005–1.030)
pH: 6.5 (ref 5.0–8.0)

## 2024-06-24 LAB — CBC
HCT: 43.8 % (ref 39.0–52.0)
Hemoglobin: 13.8 g/dL (ref 13.0–17.0)
MCH: 23.4 pg — ABNORMAL LOW (ref 26.0–34.0)
MCHC: 31.5 g/dL (ref 30.0–36.0)
MCV: 74.4 fL — ABNORMAL LOW (ref 80.0–100.0)
Platelets: 236 K/uL (ref 150–400)
RBC: 5.89 MIL/uL — ABNORMAL HIGH (ref 4.22–5.81)
RDW: 18.1 % — ABNORMAL HIGH (ref 11.5–15.5)
WBC: 12.8 K/uL — ABNORMAL HIGH (ref 4.0–10.5)
nRBC: 0 % (ref 0.0–0.2)

## 2024-06-24 LAB — I-STAT VENOUS BLOOD GAS, ED
Acid-base deficit: 2 mmol/L (ref 0.0–2.0)
Bicarbonate: 24 mmol/L (ref 20.0–28.0)
Calcium, Ion: 1.1 mmol/L — ABNORMAL LOW (ref 1.15–1.40)
HCT: 43 % (ref 39.0–52.0)
Hemoglobin: 14.6 g/dL (ref 13.0–17.0)
O2 Saturation: 100 %
Potassium: 4.4 mmol/L (ref 3.5–5.1)
Sodium: 139 mmol/L (ref 135–145)
TCO2: 25 mmol/L (ref 22–32)
pCO2, Ven: 43.1 mmHg — ABNORMAL LOW (ref 44–60)
pH, Ven: 7.354 (ref 7.25–7.43)
pO2, Ven: 186 mmHg — ABNORMAL HIGH (ref 32–45)

## 2024-06-24 LAB — ECHOCARDIOGRAM COMPLETE
AV Mean grad: 1 mmHg
AV Peak grad: 3.2 mmHg
Ao pk vel: 0.9 m/s
Area-P 1/2: 6.48 cm2
Calc EF: 19.3 %
Est EF: <20
Height: 65 in
S' Lateral: 5.3 cm
Single Plane A2C EF: 14.3 %
Single Plane A4C EF: 25.6 %
Weight: 2218.71 [oz_av]

## 2024-06-24 LAB — LACTIC ACID, PLASMA
Lactic Acid, Venous: 1.5 mmol/L (ref 0.5–1.9)
Lactic Acid, Venous: 4.7 mmol/L (ref 0.5–1.9)

## 2024-06-24 LAB — HEMOGLOBIN A1C
Hgb A1c MFr Bld: 5.2 % (ref 4.8–5.6)
Mean Plasma Glucose: 102.54 mg/dL

## 2024-06-24 LAB — PROCALCITONIN: Procalcitonin: 0.12 ng/mL

## 2024-06-24 LAB — URINALYSIS, MICROSCOPIC (REFLEX): Bacteria, UA: NONE SEEN

## 2024-06-24 LAB — SEDIMENTATION RATE: Sed Rate: 1 mm/h (ref 0–16)

## 2024-06-24 LAB — MAGNESIUM: Magnesium: 2.2 mg/dL (ref 1.7–2.4)

## 2024-06-24 LAB — TROPONIN T, HIGH SENSITIVITY: Troponin T High Sensitivity: 214 ng/L (ref 0–19)

## 2024-06-24 LAB — CREATININE, SERUM
Creatinine, Ser: 1.36 mg/dL — ABNORMAL HIGH (ref 0.61–1.24)
GFR, Estimated: 60 mL/min — ABNORMAL LOW

## 2024-06-24 LAB — DIGOXIN LEVEL: Digoxin Level: 0.8 ng/mL (ref 0.8–2.0)

## 2024-06-24 LAB — PHOSPHORUS: Phosphorus: 1.9 mg/dL — ABNORMAL LOW (ref 2.5–4.6)

## 2024-06-24 LAB — TSH: TSH: 1 u[IU]/mL (ref 0.350–4.500)

## 2024-06-24 MED ORDER — ONDANSETRON HCL 4 MG/2ML IJ SOLN: 4.0000 mg | Freq: Four times a day (QID) | INTRAMUSCULAR | Status: DC | PRN

## 2024-06-24 MED ORDER — PERFLUTREN LIPID MICROSPHERE
1.0000 mL | INTRAVENOUS | Status: AC | PRN
  Administered 2024-06-24: 2 mL via INTRAVENOUS

## 2024-06-24 MED ORDER — ACETAMINOPHEN 650 MG RE SUPP: 650.0000 mg | Freq: Four times a day (QID) | RECTAL | Status: DC | PRN

## 2024-06-24 MED ORDER — SPIRONOLACTONE 25 MG PO TABS: 25.0000 mg | ORAL_TABLET | Freq: Every day | ORAL | Status: DC

## 2024-06-24 MED ORDER — ROSUVASTATIN CALCIUM 20 MG PO TABS
20.0000 mg | ORAL_TABLET | Freq: Every day | ORAL | Status: DC
  Administered 2024-06-24 – 2024-06-28 (×5): 20 mg via ORAL
  Filled 2024-06-24 (×5): qty 1

## 2024-06-24 MED ORDER — IPRATROPIUM-ALBUTEROL 0.5-2.5 (3) MG/3ML IN SOLN
3.0000 mL | Freq: Four times a day (QID) | RESPIRATORY_TRACT | Status: DC
  Administered 2024-06-24 – 2024-06-25 (×5): 3 mL via RESPIRATORY_TRACT
  Filled 2024-06-24 (×5): qty 3

## 2024-06-24 MED ORDER — PREDNISONE 20 MG PO TABS
40.0000 mg | ORAL_TABLET | Freq: Every day | ORAL | Status: DC
  Administered 2024-06-24 – 2024-06-26 (×3): 40 mg via ORAL
  Filled 2024-06-24 (×3): qty 2

## 2024-06-24 MED ORDER — IPRATROPIUM-ALBUTEROL 0.5-2.5 (3) MG/3ML IN SOLN
3.0000 mL | Freq: Once | RESPIRATORY_TRACT | Status: AC
  Administered 2024-06-24: 3 mL via RESPIRATORY_TRACT
  Filled 2024-06-24: qty 3

## 2024-06-24 MED ORDER — METHYLPREDNISOLONE SODIUM SUCC 125 MG IJ SOLR
125.0000 mg | INTRAMUSCULAR | Status: AC
  Administered 2024-06-24: 125 mg via INTRAVENOUS
  Filled 2024-06-24: qty 2

## 2024-06-24 MED ORDER — AZITHROMYCIN 250 MG PO TABS
500.0000 mg | ORAL_TABLET | Freq: Every day | ORAL | Status: AC
  Administered 2024-06-25 – 2024-06-26 (×2): 500 mg via ORAL
  Filled 2024-06-24 (×2): qty 2

## 2024-06-24 MED ORDER — SODIUM CHLORIDE 0.9 % IV SOLN
500.0000 mg | INTRAVENOUS | Status: AC
  Administered 2024-06-24: 500 mg via INTRAVENOUS
  Filled 2024-06-24: qty 5

## 2024-06-24 MED ORDER — HEPARIN BOLUS VIA INFUSION: 3000.0000 [IU] | Freq: Once | INTRAVENOUS | Status: DC

## 2024-06-24 MED ORDER — EMPAGLIFLOZIN 10 MG PO TABS
10.0000 mg | ORAL_TABLET | Freq: Every day | ORAL | Status: DC
  Administered 2024-06-24 – 2024-06-28 (×5): 10 mg via ORAL
  Filled 2024-06-24 (×5): qty 1

## 2024-06-24 MED ORDER — LORAZEPAM 2 MG/ML IJ SOLN
0.5000 mg | Freq: Once | INTRAMUSCULAR | Status: DC
  Filled 2024-06-24: qty 1

## 2024-06-24 MED ORDER — FUROSEMIDE 10 MG/ML IJ SOLN
80.0000 mg | Freq: Two times a day (BID) | INTRAMUSCULAR | Status: DC
  Administered 2024-06-24 – 2024-06-25 (×3): 80 mg via INTRAVENOUS
  Filled 2024-06-24 (×3): qty 8

## 2024-06-24 MED ORDER — ACETAMINOPHEN 325 MG PO TABS
650.0000 mg | ORAL_TABLET | Freq: Four times a day (QID) | ORAL | Status: DC | PRN
  Administered 2024-06-24: 650 mg via ORAL
  Filled 2024-06-24: qty 2

## 2024-06-24 MED ORDER — HEPARIN SODIUM (PORCINE) 5000 UNIT/ML IJ SOLN
5000.0000 [IU] | Freq: Three times a day (TID) | INTRAMUSCULAR | Status: DC
  Administered 2024-06-24 – 2024-06-28 (×11): 5000 [IU] via SUBCUTANEOUS
  Filled 2024-06-24 (×12): qty 1

## 2024-06-24 MED ORDER — ONDANSETRON HCL 4 MG PO TABS: 4.0000 mg | ORAL_TABLET | Freq: Four times a day (QID) | ORAL | Status: DC | PRN

## 2024-06-24 MED ORDER — SACUBITRIL-VALSARTAN 24-26 MG PO TABS
1.0000 | ORAL_TABLET | Freq: Two times a day (BID) | ORAL | Status: DC
  Administered 2024-06-24 – 2024-06-25 (×3): 1 via ORAL
  Filled 2024-06-24 (×3): qty 1

## 2024-06-24 MED ORDER — ALBUTEROL SULFATE (2.5 MG/3ML) 0.083% IN NEBU: 2.5000 mg | INHALATION_SOLUTION | Freq: Four times a day (QID) | RESPIRATORY_TRACT | Status: DC | PRN

## 2024-06-24 MED ORDER — SPIRONOLACTONE 12.5 MG HALF TABLET
12.5000 mg | ORAL_TABLET | Freq: Every day | ORAL | Status: DC
  Administered 2024-06-24: 12.5 mg via ORAL
  Filled 2024-06-24: qty 1

## 2024-06-24 MED ORDER — DIGOXIN 125 MCG PO TABS
0.1250 mg | ORAL_TABLET | Freq: Every day | ORAL | Status: DC
  Administered 2024-06-24 – 2024-06-25 (×2): 0.125 mg via ORAL
  Filled 2024-06-24 (×2): qty 1

## 2024-06-24 NOTE — Progress Notes (Signed)
2D echo complete 

## 2024-06-24 NOTE — Progress Notes (Signed)
 No charge note  Patient seen and examined this morning, admitted overnight, H&P reviewed and agree with the assessment and plan.  58 year old male with history of nonischemic cardiomyopathy with EF less than 20%, CKD 3B, nonobstructive CAD, tobacco use, prior EtOH, currently homeless living on the streets comes into the hospital with increased shortness of breath.  Workup on admission was concerning for acute on chronic CHF, cardiology was consulted, he was placed on diuretics and admitted to the hospital  Principal problem Acute on chronic systolic CHF-most recent 2D echo showed EF less than 20%.  He has been placed on Lasix , continue.  Cardiology consulted, appreciate input.  QRS is quite wide, I wonder whether he will need consideration for CRT, defer to cardiology  Active problems Elevated troponin-likely demand ischemia in the setting of #1, no significant chest discomfort today  Asthma/COPD-not wheezing on my evaluation, started on prednisone  on admission, continue.  Empiric azithromycin  noted as well.  Continue nebulizers  CKD 3A-baseline creatinine 1.4-1.5, currently at baseline.  Watch with Lasix   Homelessness-TOC consult.  He tells me there is a wait list even for shelters and he could not get into anyone of them  Scheduled Meds:  [START ON 06/25/2024] azithromycin   500 mg Oral Daily   digoxin   0.125 mg Oral Daily   empagliflozin   10 mg Oral QAC breakfast   furosemide   80 mg Intravenous Q12H   heparin   5,000 Units Subcutaneous Q8H   ipratropium-albuterol   3 mL Nebulization Q6H   LORazepam   0.5 mg Intravenous Once   predniSONE   40 mg Oral Q breakfast   rosuvastatin   20 mg Oral Daily   sacubitril -valsartan   1 tablet Oral BID   spironolactone   12.5 mg Oral QHS   Continuous Infusions: PRN Meds:.acetaminophen  **OR** acetaminophen , albuterol , ondansetron  **OR** ondansetron  (ZOFRAN ) IV  Amil Moseman M. Trixie, MD, PhD [facility]  Between 7 am - 7 pm you can contact me via  Amion (for emergencies) or Securechat (non urgent matters).  I am not available 7 pm - 7 am, please contact night coverage MD/APP via Amion

## 2024-06-24 NOTE — ED Notes (Signed)
 Urinal 0935 hours.

## 2024-06-24 NOTE — Progress Notes (Signed)
" °   06/24/24 2112  BiPAP/CPAP/SIPAP  Reason BIPAP/CPAP not in use Non-compliant (Pt. refused. Pt. educated on why to wear machine.)  FiO2 (%) 21 %  BiPAP/CPAP /SiPAP Vitals  SpO2 98 %  Bilateral Breath Sounds Diminished    "

## 2024-06-24 NOTE — Consult Note (Signed)
 "  Cardiology Consultation   Patient ID: Jesse Key MRN: [id number]; DOB: August 04, 1965  Admit date: 06/23/2024 Date of Consult: 06/24/2024  PCP:  Celestia Rosaline SQUIBB, NP   [facility] Providers Cardiologist:  None  Advanced Heart Failure:  Toribio Fuel, MD       Patient Profile: Jesse Key is a 58 y.o. male with a hx of NICM (EF less than 20%), tobacco use disorder, prior alcohol use who is being seen 06/24/2024 for the evaluation of concern for volume overload/acute decompensated heart failure at the request of emergency medicine.  History of Present Illness: Jesse Key 59 year old gentleman with a history notable for nonischemic cardiomyopathy (EF<20%)-established with advanced heart failure in outpatient setting, asthma, CKD 3, tobacco use, prior alcohol use who presented to the emergency department in the context of 2-day history of dyspnea.  Patient reports that he was in his usual state of health-which typically consist of being able to perform the vast majority of his activities of daily living-not performing any form of exercise but able to participate in his job without significant dyspnea on exertion.  He developed acute onset dyspnea 2 days ago that he thought was initially secondary to an asthma exacerbation-though symptoms progressed over the past 48 hours prompting presentation to the ED today.  Subjectively-he does not feel as if he is holding onto fluid-no lower extremity edema, no orthopnea, PND over the past several days.   He states he is compliant with his outpatient medication regimen which includes digoxin  0.125 daily, Lasix  40 mg daily, Jardiance  10 mg, Toprol -XL 25 mg nightly, spironolactone  12.5 mg daily, Entresto  24-26.   On presentation to the ED, he was noted to be afebrile, hemodynamically stable-though tachycardic with heart rate ranging from 100-120 in sinus rhythm.  EKG reviewed, sinus rhythm with accentuation of baseline inferior  nonspecific ST abnormalities.  Initial labs were notable for a creatinine of 1.4-baseline 1.3-1.4, high-sensitivity troponin of 125, proBNP 11,000 from 7000 once previously checked on June 20, 2024 and heart failure clinic.  Liver enzymes pending.  Digoxin  level pending.  Chest x-ray revealing mild interstitial edema in addition to cardiomegaly.  CTA PE study without evidence of pulmonary embolism.  Lungs on the CT does not show significant pulmonary edema.   Past Medical History:  Diagnosis Date   Asthma    Shellfish allergy     Past Surgical History:  Procedure Laterality Date   RIGHT/LEFT HEART CATH AND CORONARY ANGIOGRAPHY N/A 11/24/2023   Procedure: RIGHT/LEFT HEART CATH AND CORONARY ANGIOGRAPHY;  Surgeon: Fuel Toribio SAUNDERS, MD;  Location: [facility];  Service: Cardiovascular;  Laterality: N/A;       Scheduled Meds:  LORazepam   0.5 mg Intravenous Once   Continuous Infusions:  heparin  750 Units/hr (06/24/24 0031)   PRN Meds:   Allergies:   Allergies[1]  Social History:   Social History   Socioeconomic History   Marital status: Single    Spouse name: Not on file   Number of children: Not on file   Years of education: Not on file   Highest education level: Not on file  Occupational History   Not on file  Tobacco Use   Smoking status: Some Days    Current packs/day: 0.25    Types: Cigarettes   Smokeless tobacco: Never  Vaping Use   Vaping status: Never Used  Substance and Sexual Activity   Alcohol use: Yes    Comment: weekends only   Drug use: No  Sexual activity: Not on file  Other Topics Concern   Not on file  Social History Narrative   ** Merged History Encounter **       Homeless at the time may smoke one cigarette a day   Social Drivers of Health   Tobacco Use: High Risk (06/23/2024)   Patient History    Smoking Tobacco Use: Some Days    Smokeless Tobacco Use: Never    Passive Exposure: Not on file  Financial Resource Strain:  High Risk (04/12/2024)   Overall Financial Resource Strain (CARDIA)    Difficulty of Paying Living Expenses: Hard  Food Insecurity: Food Insecurity Present (06/04/2024)   Epic    Worried About Programme Researcher, Broadcasting/film/video in the Last Year: Sometimes true    Ran Out of Food in the Last Year: Sometimes true  Transportation Needs: Unmet Transportation Needs (06/04/2024)   Epic    Lack of Transportation (Medical): Yes    Lack of Transportation (Non-Medical): Yes  Physical Activity: Not on file  Stress: Stress Concern Present (06/04/2024)   [employer] of Occupational Health - Occupational Stress Questionnaire    Feeling of Stress: To some extent  Social Connections: Not on file  Intimate Partner Violence: Not At Risk (04/02/2024)   Epic    Fear of Current or Ex-Partner: No    Emotionally Abused: No    Physically Abused: No    Sexually Abused: No  Depression (PHQ2-9): Medium Risk (06/04/2024)   Depression (PHQ2-9)    PHQ-2 Score: 9  Alcohol Screen: Not on file  Housing: High Risk (06/04/2024)   Epic    Unable to Pay for Housing in the Last Year: Yes    Number of Times Moved in the Last Year: 1    Homeless in the Last Year: Yes  Utilities: At Risk (06/04/2024)   Epic    Threatened with loss of utilities: Yes  Health Literacy: Adequate Health Literacy (01/03/2024)   B1300 Health Literacy    Frequency of need for help with medical instructions: Rarely    Family History:    Family History  Problem Relation Age of Onset   Diabetes Mother    Hypertension Mother    Diabetes Father    Hypertension Father      ROS:  Please see the history of present illness.   All other ROS reviewed and negative.     Physical Exam/Data: Vitals:   06/24/24 0000 06/24/24 0015 06/24/24 0030 06/24/24 0045  BP: 128/89 (!) 136/99 123/83 130/82  Pulse: (!) 124 (!) 130 (!) 117 (!) 119  Resp: (!) 27 (!) 30 (!) 32 (!) 34  Temp:      TempSrc:      SpO2: 100% 100% 100% 100%  Weight:      Height:       No  intake or output data in the 24 hours ending 06/24/24 0144    06/23/2024   10:12 PM 06/20/2024    2:43 PM 06/12/2024    5:40 PM  Last 3 Weights  Weight (lbs) 138 lb 10.7 oz 138 lb 9.6 oz 134 lb  Weight (kg) 62.9 kg 62.869 kg 60.782 kg     Body mass index is 23.08 kg/m.  General: Chronically ill-appearing male, sitting at the side of the bed in mild distress HEENT: normal Neck: JVD 3 cm above the clavicle right position Cardiac: Tachycardic but regular, no appreciable murmurs rubs or gallops, normal S1-S2, no appreciable S3 Lungs: Lungs are overall clear to auscultation-though  he has diffuse wheezing with poor air movement with inspiration Abd: Soft, nontender nondistended Ext: no edema, bilateral lower extremities are warm to touch Neuro: Alert and conversant with spontaneous movement of all 4 extremities Psych:  Normal affect   EKG:  The EKG was personally reviewed and demonstrates: Sinus tachycardia, left bundle branch block, nonspecific inferior ST segment abnormalities  Relevant CV Studies: TTE 04/02/2024  1. Left ventricular ejection fraction, by estimation, is 15-20%. The left  ventricle has severely decreased function. The left ventricle demonstrates  global hypokinesis. The left ventricular internal cavity size was  moderately dilated. Left ventricular  diastolic parameters are indeterminate.   2. Right ventricular systolic function is mildly reduced. The right  ventricular size is mildly enlarged. There is moderately elevated  pulmonary artery systolic pressure. The estimated right ventricular  systolic pressure is 54.7 mmHg.   3. Left atrial size was moderately dilated.   4. Right atrial size was mild to moderately dilated.   5. The mitral valve is grossly normal. Severe mitral valve regurgitation,  systolic reversals pulmoanry veins. No evidence of mitral stenosis.   6. Tricuspid valve regurgitation is severe, systolic hepatic vein  reversals noted by color flow  Doppler.   7. The aortic valve is tricuspid. There is mild thickening of the aortic  valve. Aortic valve regurgitation is trivial. No aortic stenosis is  present.   8. The inferior vena cava is dilated in size with <50% respiratory  variability, suggesting right atrial pressure of 15 mmHg.   Laboratory Data: High Sensitivity Troponin:  No results for input(s): TROPONINIHS in the last 720 hours.   Chemistry Recent Labs  Lab 06/20/24 1519 06/23/24 2241  NA 143 139  K 4.5 5.1  CL 104 106  CO2 28 22  GLUCOSE 97 89  BUN 22* 17  CREATININE 1.48* 1.41*  CALCIUM  9.0 8.3*  GFRNONAA 54* 57*  ANIONGAP 11 11    No results for input(s): PROT, ALBUMIN, AST, ALT, ALKPHOS, BILITOT in the last 168 hours. Lipids No results for input(s): CHOL, TRIG, HDL, LABVLDL, LDLCALC, CHOLHDL in the last 168 hours.  Hematology Recent Labs  Lab 06/23/24 2241  WBC 13.2*  RBC 4.77  HGB 11.5*  HCT 36.1*  MCV 75.7*  MCH 24.1*  MCHC 31.9  RDW 17.4*  PLT 251   Thyroid No results for input(s): TSH, FREET4 in the last 168 hours.  BNP Recent Labs  Lab 06/20/24 1519 06/23/24 2241  PROBNP 7,627.0* 11,603.0*    DDimer No results for input(s): DDIMER in the last 168 hours.  Radiology/Studies:  CT Angio Chest PE W and/or Wo Contrast Result Date: 06/23/2024 CLINICAL DATA:  Shortness of breath EXAM: CT ANGIOGRAPHY CHEST WITH CONTRAST TECHNIQUE: Multidetector CT imaging of the chest was performed using the standard protocol during bolus administration of intravenous contrast. Multiplanar CT image reconstructions and MIPs were obtained to evaluate the vascular anatomy. RADIATION DOSE REDUCTION: This exam was performed according to the departmental dose-optimization program which includes automated exposure control, adjustment of the mA and/or kV according to patient size and/or use of iterative reconstruction technique. CONTRAST:  75mL OMNIPAQUE  IOHEXOL  350 MG/ML SOLN COMPARISON:   Chest x-ray from earlier in the same day. FINDINGS: Cardiovascular: Thoracic aorta demonstrates mild atherosclerotic calcifications without aneurysmal dilatation. Coronary calcifications are noted. The heart is enlarged in size. Pulmonary artery shows a normal branching pattern bilaterally. No intraluminal filling defect to suggest pulmonary embolism is noted. Mediastinum/Nodes: Thoracic inlet is within normal limits. No hilar or mediastinal  adenopathy is noted. The esophagus as visualized is within normal limits. Lungs/Pleura: Lungs are well aerated bilaterally. No focal infiltrate or sizable effusion is seen. A few tiny subpleural nodules are noted within the left upper lobe. These are stable in appearance from the prior exam. Upper Abdomen: Visualized upper abdomen is within normal limits. Musculoskeletal: Bony structures are within normal limits. Review of the MIP images confirms the above findings. IMPRESSION: No evidence of pulmonary emboli. A few tiny subpleural nodules are noted in the left upper lobe.No routine follow-up imaging is recommended per Fleischner Society Guidelines. Aortic Atherosclerosis (ICD10-I70.0). Electronically Signed   By: Oneil Devonshire M.D.   On: 06/23/2024 23:56   DG Chest Portable 1 View Result Date: 06/23/2024 EXAM: 1 VIEW XRAY OF THE CHEST 06/23/2024 10:45:00 PM COMPARISON: 04/01/2024 CLINICAL HISTORY: sob, cp, chf FINDINGS: LUNGS AND PLEURA: Pulmonary vascular congestion. Kerley B lines in the lower lungs compatible with interstitial edema. No focal pulmonary opacity. No pleural effusion. No pneumothorax. HEART AND MEDIASTINUM: Unchanged cardiomegaly. No acute abnormality of the mediastinal silhouette. BONES AND SOFT TISSUES: No acute osseous abnormality. IMPRESSION: 1. CHF with mild interstitial edema. Electronically signed by: Norman Gatlin MD 06/23/2024 10:57 PM EST RP Workstation: [id number]     Assessment and Plan: Acute on chronic biventricular systolic heart failure  (nonischemic) Etiology of heart failure is unclear at this time-no evidence of coronary disease on left heart catheterization in May 2025.  No significant LGE on cardiac MRI obtained earlier this year as well.  Nonetheless, he has been managed by our advanced heart failure team as an outpatient-and optimized in terms of guideline directed medical therapy.  Unfortunately, he presents today with what seems to be an episode of acute onset progressive dyspnea which may be from acute decompensated heart failure-with an unclear precipitant.    He is compliant with his medications, no notable arrhythmias, unlikely ischemia driven-low suspicion for underlying infection.  Dietary indiscretion may be part of the picture.  Nonetheless, we will manage accordingly as outlined below.  Will point out that he did improve with DuoNebs that suggests partly respiratory in origin (asthma versus COPD)-though his proBNP is significantly elevated.  Clinically-she does not have overt volume overload on exam-though phenotypically may hold a lot of fluid in his abdomen.  Would also consider alternative etiologies of acute onset dyspnea-as he historically states he has had severe asthma and has wheezing and poor air movement on my exam.  Current hemodynamic status: -Warm, wet  Preload: -Please administer Lasix  80 mg IV, likely repeat within the next 6 hours -Goal net -1 to 2 L over the next 24 hours  Afterload: - See GDMT below  GDMT: - Hold metoprolol  succinate for now, can likely be reinitiated in the near future - Continue home Entresto  24/26 mg twice daily - Continue home spironolactone  25 mg daily - Continue home Jardiance  10 mg daily  Device therapies: -Relatively wide left bundle, could consider referral to EP if continues to decompensate with frequent exacerbations  Additional medical Rx: - Continue home digoxin  0.125 mg daily, dig level pending  Maintenance: -2 L fluid restriction -Maintain K>4,  magnesium >2 -Reasonable to continue BiPAP for now for dyspnea/volume overload (no acute hypoxic respiratory failure) -Send off respiratory pathogen panel -Send off urine drug screen -Send off TSH  2.  Non-ACS associated troponin elevation, type II MI Suspect his troponin elevation is secondary to demand ischemia rather than an acute coronary syndrome.  Nothing to do for now.  No indication for heparinization.  Risk Assessment/Risk Scores:       [place]  Heart Association (NYHA) Functional Class NYHA Class III/4       For questions or updates, please contact [facility] Please consult [email or web address] for contact info under    Signed, Dorn Kapur, MD  06/24/2024 1:44 AM     [1]  Allergies Allergen Reactions   Shrimp [Shellfish Allergy] Anaphylaxis and Itching    ALL SHELLFISH   Shellfish Allergy Itching   "

## 2024-06-24 NOTE — ED Notes (Signed)
 Patient sitting upright eating food tray.

## 2024-06-24 NOTE — ED Notes (Signed)
 Pt stood up and pulled off bipap mask stating he can't wear it

## 2024-06-24 NOTE — H&P (Signed)
 " History and Physical    Jesse Key FMW:[id number] DOB: 11/07/1965 DOA: 06/23/2024  PCP: Celestia Rosaline SQUIBB, NP  Patient coming from: home  I have personally briefly reviewed patient's old medical records in [facility]  Chief Complaint: sob x 2 days  HPI: Jesse Key is a 58 y.o. male with medical history significant of  NICM (EF less than 20%),CKDIIIb tobacco, non-obstructive CAD, use disorder, prior alcohol use , Asthma who presents to ED with 2 days of progressive sob with associated chest pain.  Patient noted mild increase lower extremity edema and states his symptoms feel similar to when he has his an exacerbation of CHF.  He notes no fever/chills/ cough but has noted wheezing. Patient describes his chest pain as pressure but notes this was relieved with rescue inhaler.  Patient currently is chest pain free but does note sob and significant orthopnea.    ED Course:  Afeb,  hr 110, rr 19,  Bp 125/88 Wbc 13.2, hgb 11.5, plt 251 Wbc 139, K 5.1 Cl 106, bicarb 22, cr 1.41( at baseline  Bnp 11603 was 7627 CE125 Cxr CHF with mild interstitial edema. CTPE IMPRESSION: No evidence of pulmonary emboli.   A few tiny subpleural nodules are noted in the left upper lobe.No routine follow-up imaging is recommended per Fleischner Society Guidelines.   Aortic Atherosclerosis (ICD10-I70.0).  Tx lasix  80 mg iv x 1   Review of Systems: As per HPI otherwise 10 point review of systems negative.   Past Medical History:  Diagnosis Date   Asthma    Shellfish allergy     Past Surgical History:  Procedure Laterality Date   RIGHT/LEFT HEART CATH AND CORONARY ANGIOGRAPHY N/A 11/24/2023   Procedure: RIGHT/LEFT HEART CATH AND CORONARY ANGIOGRAPHY;  Surgeon: Cherrie Toribio SAUNDERS, MD;  Location: [facility];  Service: Cardiovascular;  Laterality: N/A;     reports that he has been smoking cigarettes. He has never used smokeless tobacco. He reports current alcohol use. He  reports that he does not use drugs.  Allergies[1]  Family History  Problem Relation Age of Onset   Diabetes Mother    Hypertension Mother    Diabetes Father    Hypertension Father     Prior to Admission medications  Medication Sig Start Date End Date Taking? Authorizing Provider  albuterol  (VENTOLIN  HFA) 108 (90 Base) MCG/ACT inhaler Inhale 1-2 puffs into the lungs every 6 (six) hours as needed for wheezing or shortness of breath. 03/13/24  Yes Smoot, Sarah A, PA-C  clotrimazole -betamethasone  (LOTRISONE ) cream Apply 1 Application topically 2 (two) times daily. 02/01/24  Yes Celestia Rosaline SQUIBB, NP  albuterol  (VENTOLIN  HFA) 108 (90 Base) MCG/ACT inhaler Inhale 2 puffs into the lungs every 6 (six) hours as needed for wheezing or shortness of breath. 11/25/23   Shitarev, Dimitry, MD  aspirin  EC 81 MG tablet Take 1 tablet (81 mg total) by mouth daily. Swallow whole. Patient not taking: Reported on 06/24/2024 03/13/24   Smoot, Lauraine LABOR, PA-C  aspirin  EC 81 MG tablet Take 1 tablet (81 mg total) by mouth daily. Swallow whole. Patient not taking: Reported on 06/20/2024 12/06/23   Glena Harlene HERO, FNP  cyclobenzaprine  (FLEXERIL ) 10 MG tablet Take 10 mg by mouth 2 (two) times daily as needed. Patient not taking: Reported on 06/20/2024 11/18/23   [provider]  digoxin  (LANOXIN ) 0.125 MG tablet Take 1 tablet (0.125 mg total) by mouth daily. 05/23/24   Bensimhon, Toribio SAUNDERS, MD  empagliflozin  (JARDIANCE )  10 MG TABS tablet Take 1 tablet (10 mg total) by mouth daily before breakfast. 05/17/24   Colletta Manuelita Garre, PA-C  furosemide  (LASIX ) 40 MG tablet Take 1 tablet (40 mg total) by mouth daily. 05/23/24   Bensimhon, Toribio SAUNDERS, MD  hydrOXYzine  (ATARAX ) 10 MG tablet Take 1 tablet (10 mg total) by mouth 3 (three) times daily as needed. Patient not taking: Reported on 06/20/2024 02/27/24   Celestia Rosaline SQUIBB, NP  metoprolol  succinate (TOPROL -XL) 25 MG 24 hr tablet Take 1 tablet (25 mg total) by mouth  at bedtime. 06/20/24   Lee, Jordan, NP  predniSONE  (DELTASONE ) 20 MG tablet Take 2 tablets (40 mg total) by mouth daily with breakfast. Patient not taking: Reported on 06/20/2024 04/05/24   Arrien, Mauricio Daniel, MD  rosuvastatin  (CRESTOR ) 20 MG tablet Take 1 tablet (20 mg total) by mouth daily. 05/17/24   Colletta Manuelita Garre, PA-C  sacubitril -valsartan  (ENTRESTO ) 24-26 MG Take 1 tablet by mouth 2 (two) times daily. 06/07/24   Milford, Harlene HERO, FNP  spironolactone  (ALDACTONE ) 25 MG tablet Take 1 tablet (25 mg total) by mouth at bedtime. 05/21/24   Lee, Jordan, NP    Physical Exam: Vitals:   06/24/24 0030 06/24/24 0045 06/24/24 0335 06/24/24 0340  BP: 123/83 130/82 110/74 108/61  Pulse: (!) 117 (!) 119 (!) 110 100  Resp: (!) 32 (!) 34 14 20  Temp:   99.2 F (37.3 C) 98.6 F (37 C)  TempSrc:   Oral Oral  SpO2: 100% 100% 98% 99%  Weight:      Height:        Constitutional: NAD, calm, comfortable Vitals:   06/24/24 0030 06/24/24 0045 06/24/24 0335 06/24/24 0340  BP: 123/83 130/82 110/74 108/61  Pulse: (!) 117 (!) 119 (!) 110 100  Resp: (!) 32 (!) 34 14 20  Temp:   99.2 F (37.3 C) 98.6 F (37 C)  TempSrc:   Oral Oral  SpO2: 100% 100% 98% 99%  Weight:      Height:       Eyes: PERRL, lids and conjunctivae normal ENMT: Mucous membranes are moist. Posterior pharynx clear of any exudate or lesions.Normal dentition.  Neck: normal, supple, no masses, no thyromegaly Respiratory: clear to auscultation bilaterally, no wheezing, no crackles. But decrease aeration on right base.Normal respiratory effort. No accessory muscle use.  Cardiovascular: Regular rate and rhythm, no murmurs / rubs / gallops. No extremity edema. 2+ pedal pulses. No carotid bruits.  Abdomen: no tenderness, no masses palpated. No hepatosplenomegaly. Bowel sounds positive.  Musculoskeletal: no clubbing / cyanosis. No joint deformity upper and lower extremities. Good ROM, no contractures. Normal muscle tone.  Skin:  no rashes, lesions, ulcers. No induration Neurologic: CN 2-12 grossly intact. Sensation intact, Strength 5/5 in all 4.  Psychiatric: Normal judgment and insight. Alert and oriented x 3. Normal mood.    Labs on Admission: I have personally reviewed following labs and imaging studies  CBC: Recent Labs  Lab 06/23/24 2241 06/24/24 0026  WBC 13.2*  --   HGB 11.5* 14.6  HCT 36.1* 43.0  MCV 75.7*  --   PLT 251  --    Basic Metabolic Panel: Recent Labs  Lab 06/20/24 1519 06/23/24 2241 06/24/24 0026  NA 143 139 139  K 4.5 5.1 4.4  CL 104 106  --   CO2 28 22  --   GLUCOSE 97 89  --   BUN 22* 17  --   CREATININE 1.48* 1.41*  --  CALCIUM  9.0 8.3*  --    GFR: Estimated Creatinine Clearance: 48.5 mL/min (A) (by C-G formula based on SCr of 1.41 mg/dL (H)). Liver Function Tests: No results for input(s): AST, ALT, ALKPHOS, BILITOT, PROT, ALBUMIN in the last 168 hours. No results for input(s): LIPASE, AMYLASE in the last 168 hours. No results for input(s): AMMONIA in the last 168 hours. Coagulation Profile: No results for input(s): INR, PROTIME in the last 168 hours. Cardiac Enzymes: No results for input(s): CKTOTAL, CKMB, CKMBINDEX, TROPONINI in the last 168 hours. BNP (last 3 results) Recent Labs    06/20/24 1519 06/23/24 2241  PROBNP 7,627.0* 11,603.0*   HbA1C: No results for input(s): HGBA1C in the last 72 hours. CBG: No results for input(s): GLUCAP in the last 168 hours. Lipid Profile: No results for input(s): CHOL, HDL, LDLCALC, TRIG, CHOLHDL, LDLDIRECT in the last 72 hours. Thyroid Function Tests: No results for input(s): TSH, T4TOTAL, FREET4, T3FREE, THYROIDAB in the last 72 hours. Anemia Panel: No results for input(s): VITAMINB12, FOLATE, FERRITIN, TIBC, IRON , RETICCTPCT in the last 72 hours. Urine analysis:    Component Value Date/Time   COLORURINE YELLOW 06/24/2024 0052   APPEARANCEUR CLEAR  06/24/2024 0052   LABSPEC 1.010 06/24/2024 0052   PHURINE 6.5 06/24/2024 0052   GLUCOSEU 100 (A) 06/24/2024 0052   HGBUR SMALL (A) 06/24/2024 0052   BILIRUBINUR NEGATIVE 06/24/2024 0052   KETONESUR NEGATIVE 06/24/2024 0052   PROTEINUR 100 (A) 06/24/2024 0052   NITRITE NEGATIVE 06/24/2024 0052   LEUKOCYTESUR NEGATIVE 06/24/2024 0052    Radiological Exams on Admission: CT Angio Chest PE W and/or Wo Contrast Result Date: 06/23/2024 CLINICAL DATA:  Shortness of breath EXAM: CT ANGIOGRAPHY CHEST WITH CONTRAST TECHNIQUE: Multidetector CT imaging of the chest was performed using the standard protocol during bolus administration of intravenous contrast. Multiplanar CT image reconstructions and MIPs were obtained to evaluate the vascular anatomy. RADIATION DOSE REDUCTION: This exam was performed according to the departmental dose-optimization program which includes automated exposure control, adjustment of the mA and/or kV according to patient size and/or use of iterative reconstruction technique. CONTRAST:  75mL OMNIPAQUE  IOHEXOL  350 MG/ML SOLN COMPARISON:  Chest x-ray from earlier in the same day. FINDINGS: Cardiovascular: Thoracic aorta demonstrates mild atherosclerotic calcifications without aneurysmal dilatation. Coronary calcifications are noted. The heart is enlarged in size. Pulmonary artery shows a normal branching pattern bilaterally. No intraluminal filling defect to suggest pulmonary embolism is noted. Mediastinum/Nodes: Thoracic inlet is within normal limits. No hilar or mediastinal adenopathy is noted. The esophagus as visualized is within normal limits. Lungs/Pleura: Lungs are well aerated bilaterally. No focal infiltrate or sizable effusion is seen. A few tiny subpleural nodules are noted within the left upper lobe. These are stable in appearance from the prior exam. Upper Abdomen: Visualized upper abdomen is within normal limits. Musculoskeletal: Bony structures are within normal limits.  Review of the MIP images confirms the above findings. IMPRESSION: No evidence of pulmonary emboli. A few tiny subpleural nodules are noted in the left upper lobe.No routine follow-up imaging is recommended per Fleischner Society Guidelines. Aortic Atherosclerosis (ICD10-I70.0). Electronically Signed   By: Oneil Devonshire M.D.   On: 06/23/2024 23:56   DG Chest Portable 1 View Result Date: 06/23/2024 EXAM: 1 VIEW XRAY OF THE CHEST 06/23/2024 10:45:00 PM COMPARISON: 04/01/2024 CLINICAL HISTORY: sob, cp, chf FINDINGS: LUNGS AND PLEURA: Pulmonary vascular congestion. Kerley B lines in the lower lungs compatible with interstitial edema. No focal pulmonary opacity. No pleural effusion. No pneumothorax. HEART AND MEDIASTINUM: Unchanged  cardiomegaly. No acute abnormality of the mediastinal silhouette. BONES AND SOFT TISSUES: No acute osseous abnormality. IMPRESSION: 1. CHF with mild interstitial edema. Electronically signed by: Norman Gatlin MD 06/23/2024 10:57 PM EST RP Workstation: [id number]    EKG: Independently reviewed.   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 -tsh, UDS, RVP pending   Elevated Cardiac Enzyme  Hx of Non Obstructive CAD - thought  not to be ACS -no need for heparin  at this time per cardiology   Asthma/COPD -possible mild element  -start on COPD protocol with prednisone  40mg  po x 5 days  -nebs standing and prn  -azithromycin   Elevated wbc  - unclear cause -negative chest imaging  -will check UA, procal , lactic    DVT prophylaxis: heparin  Code Status: full/ as discussed per patient wishes in event of cardiac arrest  Family Communication:  Disposition Plan: patient  expected to be admitted greater than 2 midnights  Consults  called: Cardiology  Dr Raphael Admission status: progressive care    Camila DELENA Ned MD [facility]   If 7PM-7AM, please contact night-coverage [email or web address] Password [email or web address]  06/24/2024, 4:59 AM        [1]  Allergies Allergen Reactions   Shrimp [Shellfish Allergy] Anaphylaxis and Itching    ALL SHELLFISH   Shellfish Allergy Itching   "

## 2024-06-24 NOTE — Discharge Instructions (Signed)
[facility] assistance programs. If you are behind on your bills and expenses, and need some help to make it through a short term hardship or financial emergency, there are several organizations and charities in the [place] and [place] area that may be able to help. They range from the [employer], [employer], Landscape architect of [employer] and the local community action agency, the [employer], [employer]. These groups may be able to provide you resources to help pay your utility bills, rent, and they even offer housing assistance.  Crisis assistance program Find help for paying your rent, electric bills, free food, and even funds to pay your mortgage. The [employer] (850) 408-9249) offers several services to local families, as funding allows. The Emergency Assistance Program (EAP), which they administer, provides household goods, free food, clothing, and financial aid to people in need in the [facility] area. The EAP program does have some qualification, and counselors will interview clients for financial assistance by written referral only. Referrals need to be made by the Department of Social Services or by other EAP approved human services agencies or charities in the area.  Money for resources for emergency assistance are available for security deposits for rent, water, electric, and gas, past due rent, utility bills, past due mortgage payments, food, and clothing. The [employer] also operates a Programme researcher, broadcasting/film/video on the site. More [employer] Global.  [employer] of [employer], which can be reached at [phone], offers emergency assistance programs for those in need of help, such as food, rent assistance, a soup kitchen, shelter, and clothing. They are based in [facility] but provide a number of services to those that qualify for assistance. Continue with [employer]  programs.  [facility] Department of Social Services may be able to offer temporary financial assistance and cash grants for paying rent and utilities. Help may be provided for local county residents who may be experiencing personal crisis when other resources, including government programs, are not available. Call [phone]            [employer] [place], which is based in [place], provides financial assistance of up to $50.00 to help pay for rent, utilities, cooling bills, rent, and prescription medications. The program also provides secondhand furniture to those in need. [phone]  [employer] is a Geneticist, molecular. The organization can offer emergency assistance for paying rent, electric bills, utilities, food, household products and furniture. They offer extensive emergency and transitional housing for families, children and single women, and also run a Boy's and Dole Food. [address] [employer], [employer], and other aid offered too. [address] [place], [place] [address], [phone]  Additional locations of the [employer] are in [place] and other nearby communities. When you have an emergency, need free food, money for basic needs, or just need assistance around Christmas, then the [employer] may have the resources you need. Or they can refer you to nearby agencies. Learn more.  [place] Low Income Risk manager - This is offered for [facility] families. The federal government created [employer] Program provides a one-time cash grant payment to help eligible low-income families pay their electric and heating bills. [address] [place], [place] [address], [phone]  Government and [employer] - The county administers several emergency and self-sufficiency programs. Residents of [place]  can get help with energy bills and food, rent, and other  expenses. In addition, work with a Sports coach who may be able to help you find a job or improve your employment skills. More [place] public assistance.  [place] Emergency Assistance - A program offers emergency utility and rent funds for greater [place] area residents. The program can also provide counseling and referrals to charities and government programs. Also provides food and a free meal program that serves lunch Mondays - Saturdays and dinner seven days per week to individuals in the community. [address] [place], [place] [address], [phone]  [employer] - Offers affordable apartment and housing communities across [place] and [place]. The low income and seniors can access public housing, rental assistance to qualified applicants, and apply for the section 8 rent subsidy program. Other programs include Chiropractor and Engineer, maintenance. [address] [place], [place] [address], dial [phone].  Basic needs such as clothing - Low income families can receive free items (school supplies, clothes, holiday assistance, etc.) from clothing closets while more moderate income [address] families can shop at [employer]. Locations across the area help the needy. Get information on [place] free clothing centers.  The [facility] provides transitional housing to veterans and the disabled. Clients will also access other services too, including life skills classes, case management, and assistance in finding permanent housing. [address], [place], [place] [address], call [phone]  [facility] Transitional Housing in [place] is for people who were just evicted or that are formerly homeless. The non-profit will also help then gain self-sufficiency, find a home or apartment to live in, and also provides information on rent assistance when needed.  Dial [phone]  [employer] Partnership to End Homelessness is available in [place]. Families that were evicted or that are homeless can gain shelter, food, clothing, furniture, and also emergency financial assistance. Other services include financial skills and life skills coaching, job training, and case management. [address], [place], [place] [address]. Telephone [phone].  The [employer], [employer]. runs the [employer]. This can help people save money on their heating and summer cooling bills, and is free to low income families. Free upgrades can be made to your home. Phone [phone]  Many of the non-profits and programs mentioned above are all inclusive, meaning they can meet many needs of the low income, such as energy bills, food, rent, and more. However there are several organizations that focus just on rent and housing. Read more on rent assistance in [place] region.  Legal assistance for evictions, foreclosure, and more If you need free legal advice on civili issues, such as foreclosures, evictions, Electronics engineer, government programs, domestic issues and more, Armed forces operational officer Aid of [facility]) is a Associate Professor firm that provides free legal services and counsel to lower income people, seniors, disabled, and others. The goal is to ensure everyone has access to justice and fair representation.  Call them at [phone], or click here to learn more about [place] free legal assistance programs.  [place] [employer] and funds for emergency expenses The [employer] is another organization that can provide people with [employer] and funds to pay bills. Their assistance depends on funding, and the demand for help is always very high. They can provide cash to help pay rent, a missed mortgage payment, or gas, electric, and water bills. But the  assistance doesn't stop there. They also have a food pantry on site, which can  provide food once every three (3) months to people who need help. The [employer] can also offer a Engineering geologist once every three (3) months for a maximum three (3) times. After receiving this voucher over that period of time, applicants can receive this aid one every six (6) months after that. [phone].  Kohl's action agency The [employer], [employer]. offers job and Dispensing optician. Resources are focused on helping students obtain the skills and experiences that are necessary to compete in today's challenging and tight job market. The non-profit faith-based community action agency offers internship trainings as well as classroom instruction. Economically disadvantaged and challenged individuals and potential employers can use their services. Classes are tailored to meet the needs of people in the [facility] region. [place], [place] [address], [phone]               Foreclosure prevention services Housing Counseling and Education is also offered by [employer] of the [place]. The agency (phone number is below) is a Engineer, structural providing foreclosure advice and counseling. They offer mortgage resolution counseling and also reverse mortgage counseling. Counselors can direct people to both [employer]ark, as well as [employer] foreclosure assistance options.  Warehouse manager has locations in [place] and [place]. They run debt and foreclosure prevention programs for local families. A sampling of the programs offered include both Budget and Housing Counseling. This includes money management, financial advice, budget review and development of a written action plan with a Pensions consultant to help solve specific individual financial problems. In addition, housing and mortgage counselors can also provide pre-  and post-purchase homeownership counseling, default resolution counseling (to prevent foreclosure) and reverse mortgage counseling. A Debt Management Program allows people and families with a high level of credit card or medical debt to consolidate and repay consumer debt and loans to creditors and rebuild positive credit ratings and scores. [phone]  Debt assistance programs Receive free counseling and debt help from [facility] of the [place]. The [facility] based agency can be reached at [phone]. The counselors provide free help, and the services include budget counseling. This will help people manage their expenses and set goals. They also offer a Forensic scientist, which will help individuals consolidate their debts and become debt free. Most of the workshops and services are free.  Community clinics in [place] of the leading health and dental centers are listed below. They may be able to provide medication, physicals, dental care, and general family care to residents of all incomes and backgrounds across the region. Some of the programs focus on the low income and underinsured. However if these clinics can't meet your needs, find information and details on more clinics in [facility].  Some of the options include [employer] of [employer]. This center provides free or low cost health care to low-income adults 18 - 64, who have no health insurance. Among other services offered include a pharmacy and eye clinic. Phone [phone]  [facility], which is located in [place], is a community clinic that provides primary medical and health care to uninsured and underinsured adults and families, as well as the low income, in the greater [place] area on a sliding-fee scale. Call [phone]  [place] [facility] -  They run a dental assistance program that is organized by [employer] [facility] to provide  dental services and aid to [place] residents. Services offered by the dental clinic are limited to extractions, pain management, and minor restorative care. [phone]  [place] [facility] has locations in [place] and [place]. The community clinics provide complete pediatric care including primary health, mental health, social work, neurology, cardiology, asthma. Dial [phone].  In addition to those [address] and [facility] community clinics, find other free community clinics in [place] and across the county.  Food pantry and assistance Some of the local food pantries and distribution centers to call for free food and groceries include The Hive of [place] (phone [phone]), The [facility] (phone [phone]) and also [employer]PL CorporationFood Assistance Incorporated. Dial [phone].  Several other food banks in the region provide clothing, free food and meals, access to soup kitchens and other help. Find the addresses and phone numbers of more food pantries in [place]. http://[email or web address].[email or web address]/html/[place]_assistance_pro.[email or web address]

## 2024-06-24 NOTE — Progress Notes (Signed)
 CSW provided patient with shelter and resources list. CSW reviewed resources with patient. CSW also placed second copy on patients AVS.  Luise Pan, MSW, LCSWA Transitions of Care [phone]

## 2024-06-24 NOTE — TOC CM/SW Note (Addendum)
 Transition of Care [facility]) - Inpatient Brief Assessment   Patient Details  Name: Jesse Key MRN: [id number] Date of Birth: [birth year]  Transition [facility]) CM/SW Contact:    Waddell Barnie Rama, RN Phone Number: 06/24/2024, 3:26 PM   Clinical Narrative: Homeless, has PCP and insurance on file, states has no HH services in place at this time or DME at home. States he was just put out of his apartment, will need shelter list and food resources.   He may need transport ast at dc and his family members live out of state so he has no support system here,  states gets medications from [facility] clinic.  Pta self ambulatory.   He will be provided resources. May need ast with medications.   Transition of Care Asessment: Insurance and Status: Insurance coverage has been reviewed Patient has primary care physician: Yes Home environment has been reviewed: Homeless( has been put out of apartment) Prior level of function:: indep Prior/Current Home Services: No current home services Social Drivers of Health Review: SDOH reviewed needs interventions Readmission risk has been reviewed: Yes Transition of care needs: no transition of care needs at this time

## 2024-06-24 NOTE — Progress Notes (Addendum)
 "  Progress Note  Patient Name: Jesse Key Date of Encounter: 06/24/2024 [facility] Cardiologist: Dr. Cherrie    Interval Summary   Doing well, resting comfortably on room air  Reports improvement with IV Lasix  Good urine output per patient   Vital Signs Vitals:   06/24/24 0753 06/24/24 1000 06/24/24 1030 06/24/24 1136  BP: 115/81 116/74 112/72   Pulse: 100 96 95   Resp: (!) 21 (!) 22 (!) 22   Temp: 98.2 F (36.8 C)   97.8 F (36.6 C)  TempSrc: Oral   Oral  SpO2: 98% 100% 100%   Weight:      Height:        Intake/Output Summary (Last 24 hours) at 06/24/2024 1146 Last data filed at 06/24/2024 1136 Gross per 24 hour  Intake --  Output 1900 ml  Net -1900 ml      06/23/2024   10:12 PM 06/20/2024    2:43 PM 06/12/2024    5:40 PM  Last 3 Weights  Weight (lbs) 138 lb 10.7 oz 138 lb 9.6 oz 134 lb  Weight (kg) 62.9 kg 62.869 kg 60.782 kg     Telemetry/ECG  Sinus, LBBB, HR 90-100s - Personally Reviewed  Physical Exam  GEN: No acute distress, on room air.   Neck: + JVD Cardiac: mildly tachycardic, no murmurs, rubs, or gallops.  Respiratory: decreased breath sounds bilaterally. GI: Soft, nontender, non-distended  MS: No edema  Assessment & Plan   Chronic systolic heart failure Nonischemic cardiomyopathy  Chronic LBBB Presented with shortness of breath  Works at [employer], reports increased ham intake over the last few days Follows closely with our AHF team as an outpatient  Home meds: Lasix  40 mg daily, digoxin  0.125 mg daily, Jardiance  10 mg daily, Toprol  25 mg at bedtime, spironolactone  12.5 mg daily, Entresto  24-26 mg BID Planning to repeat echo in Feb. 2026, if EF remains reduced, consider ICD/CRT-D Dry weight 62 kg  Echo 03/2024: LVEF 15-20%, mildly reduced RV function, BAE, severe MR, severe TR, dilated IVC proBNP 11,603 Given IV Lasix  80 mg x 2 doses, reports improvement in symptoms  Good urine output per patient Renal function at  baseline  No pleural effusion on CTPA Started on IV Lasix  80 mg BID Currently on Jardiance  10 mg daily, digoxin  0.125 mg daily, Entresto  24-36 mg BID, spironolactone  12.5 mg daily  Pending updated echocardiogram Continue to monitor strict I&O's, daily weights, daily BMPs Pending respiratory panel   CKD stage 3b Baseline creatinine 1.5-1.6 Will continue on medications as above (they are mainly outpatient doses) Continue to monitor on high dose IV Lasix    Elevated troponin level  125 ? 214 Suspect demand ischemia  No current chest pain  No indication for heparin    Per primary Shortness of breath Asthma, COPD Leukocytosis  CKD History of ETOH abuse  Gout   For questions or updates, please contact [facility] Please consult [email or web address] for contact info under   Signed, Waddell DELENA Donath, PA-C   ADDENDUM:   Patient seen and examined with Waddell DELENA Donath, PA-C.  I personally taken a history, examined the patient, reviewed relevant notes,  laboratory data / imaging studies.  I performed a substantive portion of this encounter and formulated the important aspects of the plan.  I agree with the APP's note, impression, and recommendations; however, I have edited the note to reflect changes or salient points.   Patient seen and examined in ER bed 1. Resting comfortably  at 45 degrees. Denies anginal chest pain. Shortness of breath improving. No lower extremity swelling, orthopnea, or PND States that he is been eating more ham than usual.  Has been compliant with medical therapy  PHYSICAL EXAM: Today's Vitals   06/24/24 0753 06/24/24 1000 06/24/24 1030 06/24/24 1136  BP: 115/81 116/74 112/72   Pulse: 100 96 95   Resp: (!) 21 (!) 22 (!) 22   Temp: 98.2 F (36.8 C)   97.8 F (36.6 C)  TempSrc: Oral   Oral  SpO2: 98% 100% 100%   Weight:      Height:      PainSc: 2       Body mass index is 23.08 kg/m.   Net IO Since Admission: -1,900 mL [06/24/24  1146]  Filed Weights   06/23/24 2212  Weight: 62.9 kg    General: Age-appropriate, hemodynamically stable, no acute distress HEENT: Normocephalic, atraumatic, JVP Lungs: Clear to auscultation bilaterally, no wheezes rales or rhonchi's Heart: Regular, positive S1-S2, tachycardic, no murmurs rubs or gallops appreciated secondary to tachycardia Abdomen: Soft, nontender, nondistended, positive bowel sounds in all 4 quadrants Extremities: Warm to touch, no swelling Neuro: Moves all 4 extremities, nonfocal Psych: Appropriate, cooperative  EKG: (personally reviewed by me) 06/23/2024: Sinus tachycardia, 109 bpm, left bundle branch block  Telemetry: (personally reviewed by me) Sinus tachycardia   Impression & Recommendations: :  Acute heart failure with reduced EF Nonischemic cardiomyopathy Likely precipitated by recent exacerbation proBNP 11,603 compared to 7627 (4 days ago.) Chest x-ray mild interstitial edema with cardiomegaly. CT PE study negative for pulmonary embolism Left heart catheterization May 2025: No evidence of obstructive disease. Cardiac MRI: No significant LGE Euvolemic on physical examination. Follows-up with advanced heart failure clinic as outpatient. GDMT is well-established and patient reports compliance Echocardiogram pending. Respiratory follow-up pending. Continue digoxin  125 mcg p.o. daily. Continue Lasix  80 mg p.o. twice daily. Continue Entresto  24/26 mg p.o. twice daily. Continue spironolactone  12.5 mg p.o. daily Device therapies: Left bundle branch block with QRS of 162 ms, tentative plan as per the last office note was to repeat echocardiogram in 2 months and if LVEF remains reduced consider CRT-D or if LVEF improves consider BiV.   Elevated troponin likely secondary to demand ischemia in the setting of cardiomyopathy and acute heart failure exacerbation.   Medical Decision:  Complexity: High-acute heart failure exacerbation with nonischemic  cardiomyopathy requiring parenteral medications Independently reviewed: Last progress note from 06/20/2024, ER documentation, vital signs, strict I's and O's, daily weights, labs Prescription drug management -recommendations stated above Updated nursing staff.  Further recommendations to follow as the case evolves.   This note was created using a voice recognition software as a result there may be grammatical errors inadvertently enclosed that do not reflect the nature of this encounter. Every attempt is made to correct such errors.   Madonna Michele HAS, [facility]  A Division of [facility] [address], [place], [place] [address]  Pager: [phone] Office: [phone] 06/24/2024 11:46 AM   "

## 2024-06-24 NOTE — Progress Notes (Signed)
 Heart Failure Navigator Progress Note  Assessed for Heart & Vascular TOC clinic readiness.  Patient does not meet criteria due to he is an Advanced Heart Failure Team patient of Dr. Bensimhon. .   Navigator will sign off at this time.   Stephane Haddock, BSN, Scientist, clinical (histocompatibility and immunogenetics) Only

## 2024-06-25 ENCOUNTER — Other Ambulatory Visit: Payer: Self-pay

## 2024-06-25 DIAGNOSIS — I2489 Other forms of acute ischemic heart disease: Secondary | ICD-10-CM | POA: Diagnosis not present

## 2024-06-25 DIAGNOSIS — E872 Acidosis, unspecified: Secondary | ICD-10-CM

## 2024-06-25 DIAGNOSIS — N179 Acute kidney failure, unspecified: Secondary | ICD-10-CM

## 2024-06-25 DIAGNOSIS — I5021 Acute systolic (congestive) heart failure: Secondary | ICD-10-CM | POA: Diagnosis not present

## 2024-06-25 DIAGNOSIS — I428 Other cardiomyopathies: Secondary | ICD-10-CM | POA: Diagnosis not present

## 2024-06-25 DIAGNOSIS — I5082 Biventricular heart failure: Secondary | ICD-10-CM | POA: Diagnosis not present

## 2024-06-25 LAB — COMPREHENSIVE METABOLIC PANEL WITH GFR
ALT: 32 U/L (ref 0–44)
AST: 51 U/L — ABNORMAL HIGH (ref 15–41)
Albumin: 3.7 g/dL (ref 3.5–5.0)
Alkaline Phosphatase: 95 U/L (ref 38–126)
Anion gap: 17 — ABNORMAL HIGH (ref 5–15)
BUN: 29 mg/dL — ABNORMAL HIGH (ref 6–20)
CO2: 22 mmol/L (ref 22–32)
Calcium: 9.4 mg/dL (ref 8.9–10.3)
Chloride: 101 mmol/L (ref 98–111)
Creatinine, Ser: 1.78 mg/dL — ABNORMAL HIGH (ref 0.61–1.24)
GFR, Estimated: 43 mL/min — ABNORMAL LOW
Glucose, Bld: 120 mg/dL — ABNORMAL HIGH (ref 70–99)
Potassium: 4.7 mmol/L (ref 3.5–5.1)
Sodium: 140 mmol/L (ref 135–145)
Total Bilirubin: 0.5 mg/dL (ref 0.0–1.2)
Total Protein: 6.9 g/dL (ref 6.5–8.1)

## 2024-06-25 LAB — HEPATIC FUNCTION PANEL
ALT: 30 U/L (ref 0–44)
AST: 43 U/L — ABNORMAL HIGH (ref 15–41)
Albumin: 3.7 g/dL (ref 3.5–5.0)
Alkaline Phosphatase: 100 U/L (ref 38–126)
Bilirubin, Direct: 0.2 mg/dL (ref 0.0–0.2)
Indirect Bilirubin: 0.3 mg/dL (ref 0.3–0.9)
Total Bilirubin: 0.5 mg/dL (ref 0.0–1.2)
Total Protein: 7.2 g/dL (ref 6.5–8.1)

## 2024-06-25 LAB — LACTIC ACID, PLASMA
Lactic Acid, Venous: 2.7 mmol/L (ref 0.5–1.9)
Lactic Acid, Venous: 3.3 mmol/L (ref 0.5–1.9)
Lactic Acid, Venous: 2.9 mmol/L (ref 0.5–1.9)
Lactic Acid, Venous: 2.7 mmol/L (ref 0.5–1.9)

## 2024-06-25 LAB — CBC
HCT: 44.2 % (ref 39.0–52.0)
Hemoglobin: 14.2 g/dL (ref 13.0–17.0)
MCH: 23.8 pg — ABNORMAL LOW (ref 26.0–34.0)
MCHC: 32.1 g/dL (ref 30.0–36.0)
MCV: 74.2 fL — ABNORMAL LOW (ref 80.0–100.0)
Platelets: 262 K/uL (ref 150–400)
RBC: 5.96 MIL/uL — ABNORMAL HIGH (ref 4.22–5.81)
RDW: 18.2 % — ABNORMAL HIGH (ref 11.5–15.5)
WBC: 22.3 K/uL — ABNORMAL HIGH (ref 4.0–10.5)
nRBC: 0.1 % (ref 0.0–0.2)

## 2024-06-25 MED ORDER — SODIUM CHLORIDE 0.9% FLUSH: 10.0000 mL | INTRAVENOUS | Status: DC | PRN

## 2024-06-25 MED ORDER — CHLORHEXIDINE GLUCONATE CLOTH 2 % EX PADS
6.0000 | MEDICATED_PAD | Freq: Every day | CUTANEOUS | Status: DC
  Administered 2024-06-25 – 2024-06-28 (×4): 6 via TOPICAL

## 2024-06-25 MED ORDER — SODIUM CHLORIDE 0.9% FLUSH
10.0000 mL | Freq: Two times a day (BID) | INTRAVENOUS | Status: DC
  Administered 2024-06-25 – 2024-06-26 (×3): 10 mL
  Administered 2024-06-27: 20 mL
  Administered 2024-06-27 – 2024-06-28 (×2): 10 mL

## 2024-06-25 MED ORDER — DIGOXIN 125 MCG PO TABS
0.0625 mg | ORAL_TABLET | Freq: Every day | ORAL | Status: DC
  Administered 2024-06-26 – 2024-06-28 (×3): 0.0625 mg via ORAL
  Filled 2024-06-25 (×3): qty 1

## 2024-06-25 MED ORDER — MILRINONE LACTATE IN DEXTROSE 20-5 MG/100ML-% IV SOLN
0.1250 ug/kg/min | INTRAVENOUS | Status: DC
  Administered 2024-06-25: 0.125 ug/kg/min via INTRAVENOUS
  Filled 2024-06-25: qty 100

## 2024-06-25 NOTE — Progress Notes (Signed)
" ° °  Continue to trend labs on patient. Lactic acid not clearing, 4.7 ? 2.7 ? 3.3 ? 2.9.  This along with rising creatinine from 1.36 ? 1.78, concerns for possible low output heart failure/cardiorenal syndrome. Noted to be hypotensive 12 20-25 around 12 PM.  Otherwise BP has been low normal, most recently 112/71.   With all this given LVEF less than 20% on echocardiogram this admission, made the decision to reach out to the advanced heart failure team.    Discussed case with DOD, who agreed with my plan to consult advanced heart failure, place PICC line, check co-ox.  Spoke with advanced heart failure team who will see the patient.  Will follow their recommendations  Waddell DELENA Donath, PA-C 06/25/2024 4:06 PM  "

## 2024-06-25 NOTE — Progress Notes (Signed)
 Peripherally Inserted Central Catheter Placement  The IV Nurse has discussed with the patient and/or persons authorized to consent for the patient, the purpose of this procedure and the potential benefits and risks involved with this procedure.  The benefits include less needle sticks, lab draws from the catheter, and the patient may be discharged home with the catheter. Risks include, but not limited to, infection, bleeding, blood clot (thrombus formation), and puncture of an artery; nerve damage and irregular heartbeat and possibility to perform a PICC exchange if needed/ordered by physician.  Alternatives to this procedure were also discussed.  Bard Power PICC patient education guide, fact sheet on infection prevention and patient information card has been provided to patient /or left at bedside.    PICC Placement Documentation  PICC Double Lumen 06/25/24 Right Brachial 38 cm 0 cm (Active)  Indication for Insertion or Continuance of Line Chronic illness with exacerbations (CF, Sickle Cell, etc.) 06/25/24 1800  Exposed Catheter (cm) 0 cm 06/25/24 1800  Site Assessment Clean, Dry, Intact 06/25/24 1800  Lumen #1 Status Flushed;Saline locked;Blood return noted 06/25/24 1800  Lumen #2 Status Flushed;Saline locked;Blood return noted 06/25/24 1800  Dressing Type Transparent;Securing device 06/25/24 1800  Dressing Status Antimicrobial disc/dressing in place;Clean, Dry, Intact 06/25/24 1800  Line Care Connections checked and tightened 06/25/24 1800  Line Adjustment (NICU/IV Team Only) No 06/25/24 1800  Dressing Intervention New dressing;Adhesive placed at insertion site (IV team only) 06/25/24 1800  Dressing Change Due 07/02/24 06/25/24 1800       Ethyl Priestly Renee 06/25/2024, 6:03 PM

## 2024-06-25 NOTE — Progress Notes (Addendum)
 "  Progress Note  Patient Name: Jesse Key Date of Encounter: 06/25/2024  [facility] Cardiologist: Soyla DELENA Merck, MD   Interval Summary   Doing well today, resting comfortably in bed on room air. No significant overnight events per patient. Reports mild shortness of breath, though improved since admission. Finds benefit from DuoNeb, but non-compliant with BiPAP due to discomfort. Still endorses mild chest pain, only when he coughs. States that he continues to have good urine output overnight. Some dizziness when ambulating. No complaints otherwise.  Vital Signs Vitals:   06/25/24 0740 06/25/24 0851 06/25/24 1130 06/25/24 1149  BP: 108/73  106/71   Pulse: 91  (!) 101 96  Resp: 18  20   Temp: (!) 97.3 F (36.3 C)  98 F (36.7 C)   TempSrc: Oral  Oral   SpO2: 100% 98% 98%   Weight:      Height:        Intake/Output Summary (Last 24 hours) at 06/25/2024 1219 Last data filed at 06/25/2024 1155 Gross per 24 hour  Intake 1560 ml  Output 1345 ml  Net 215 ml      06/25/2024    3:50 AM 06/24/2024    3:13 PM 06/23/2024   10:12 PM  Last 3 Weights  Weight (lbs) 127 lb 6.8 oz 127 lb 10.3 oz 138 lb 10.7 oz  Weight (kg) 57.8 kg 57.9 kg 62.9 kg      Telemetry/ECG  Sinus rhythm with chronic LBBB - Personally Reviewed  Physical Exam  GEN: No acute distress. Resting comfortably at 15 degree incline on room air. Neck: No JVD Cardiac: RRR, no murmurs, rubs, or gallops.  Respiratory: Clear to auscultation bilaterally. Good air movement bilaterally. GI: Soft, nontender, non-distended. MS: No edema  Assessment & Plan   Chronic systolic heart failure Nonischemic cardiomyopathy  Chronic LBBB Presented with SOB No pleural effusion on CTPA Respiratory panel negative On prednisone  and empiric azithromycin  On high dose IV Lasix  80 mg BID Known history of asthma on albuterol  PRN, SOB continues to improve on DuoNeb Noncompliant with BiPAP due to discomfort, changed  to PRN per respiratory Works at [employer], reports increased intake of energy drinks and high salt foods in the recent past prior to admission Follows closely with our AHF team as outpatient, last seen on 06/20/24 Home meds: Lasix  40 mg daily, digoxin  0.125 mg daily, Jardiance  10 mg daily, Toprol  25 mg at bedtime, spironolactone  12.5 mg daily, Entresto  24-26 mg BID Dry weight 62 kg, currently 57.8 kg Good urine output per pt, net -1.3L urine output throughout admission Euvolemic on exam today, will discontinue Lasix  Continue to monitor strict I/Os, daily weights, daily BMPs Echo (06/24/24): LVEF <20% with global hypokinesis, normal RV size and function, mild MR, IVC now normal in size Being considered for device therapy based on repeat LVEF in Feb 2026.  Pt aware/discussed with AHF outpatient team previously, willing to proceed Continue with Jardiance  10 mg daily, digoxin  0.125 mg daily, Entresto  24-36 mg BID, spironolactone  12.5 mg daily SpO2 >95% on room air, continue DuoNeb q6 hours with 2L [place] and BiPAP PRN per respiratory Lactic acid 4.7, repeat at 2.7, will continue to trend. 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  I personally taken a history, examined the patient, reviewed relevant notes,  laboratory data /  imaging studies.  I performed a substantive portion of this encounter and formulated the important aspects of the plan.  I agree with the APP's note, impression, and recommendations; however, I have edited the note to reflect changes or salient points.   Patient resting in bed comfortably. Denies anginal chest pain. Felt lightheaded and dizzy with changing positions. Breathing treatment seems to help his shortness of breath.  PHYSICAL EXAM: Today's Vitals   06/25/24 0740 06/25/24 0851 06/25/24 1130 06/25/24 1149  BP: 108/73  106/71   Pulse: 91  (!) 101 96  Resp: 18  20   Temp: (!) 97.3 F (36.3 C)  98 F (36.7 C)   TempSrc: Oral  Oral   SpO2: 100% 98% 98%   Weight:      Height:      PainSc:   0-No pain    Body mass index is 21.2 kg/m.   Net IO Since Admission: -1,685 mL [06/25/24 1219]  Filed Weights   06/23/24 2212 06/24/24 1513 06/25/24 0350  Weight: 62.9 kg 57.9 kg 57.8 kg    Physical Exam  Constitutional: No distress.  hemodynamically stable  Neck: No JVD present.  Cardiovascular: Normal rate, regular rhythm, S1 normal and S2 normal. Exam reveals no gallop, no S3 and no S4.  No murmur heard. Pulmonary/Chest: Effort normal and breath sounds normal. No stridor. He has no wheezes. He has no rales.  Musculoskeletal:        General: No edema.     Cervical back: Neck supple.  Skin: Skin is warm.   EKG: (personally reviewed by me) No new tracings  Telemetry: (personally reviewed by me) Sinus tachycardia without ectopy   Impression & Recommendations: :  Acute heart failure with reduced EF. Nonischemic cardiomyopathy. Lactic acidosis likely secondary to overdiuresis and AKI Acute kidney injury Elevated troponins in the setting of demand  Patient presented to the hospital due to worsening shortness of breath. Recent exacerbation likely precipitated by dietary indiscretion Patient has been endorsing compliant with proBNP 11,603 compared to 7627 (4 days  ago.) Chest x-ray mild interstitial edema with cardiomegaly. CT PE study negative for pulmonary embolism Left heart catheterization May 2025: No evidence of obstructive disease. Cardiac MRI: No significant LGE Net IO Since Admission: -1,685 mL [06/25/24 1219] With IV diuresis patient has developed AKI, endorses lightheaded and dizziness with changing positions -likely overdiuresed based on his weights and examination. Follow-up lactic acids trended up 1.5, 4.7, 2.7, 3.3 Agree with discontinuation of Lasix  as he is euvolemic patient. Lactic acid should improve with better blood pressures. Patient is at risk of low output state but this morning AST/ALT were within acceptable ranges.  If he continues to have uptrending of lactic acidosis would recommend obtaining PICC line and Co-ox and heart failure consult. Check urine drug screen Will hold evening dose of Entresto  and spironolactone . Continue digoxin  125 mcg p.o. daily. Continue Jardiance  10 mg p.o. daily. Continue Entresto  24/26 mg p.o. daily, with holding parameters Continue spironolactone  12.5 mg p.o. daily,  holding parameters No beta blockers due A/CHF and low LVEF   Further recommendations to follow as the case evolves.   This note was created using a voice recognition software as a result there may be grammatical errors inadvertently enclosed that do not reflect the nature of this encounter. Every attempt is made to correct such errors.   Madonna Michele HAS, [facility]  A Division of [facility] [address] [place], [place] [address]  Pager: [phone] Office: [phone] 06/25/2024 12:19 PM  "

## 2024-06-25 NOTE — Consult Note (Addendum)
 "  Advanced Heart Failure Team Consult Note  Primary Physician: Celestia Rosaline SQUIBB, NP Cardiologist:  Soyla DELENA Merck, MD HPI:    Jesse Key is seen today for evaluation of A/C HFrEF > CGS at the request of Dr. Michele.   Jesse Key is a 59 y.o. male with hx of tobacco and alcohol use, and chronic systolic heart failure.   Admitted 5/25 with new acute systolic heart failure. Echo showed EF < 20%, normal RV, global HK, mid-mod MR. Diuresed well with IV lasix  and he was started on GDMT. R/LHC showed non-obstructive CAD, EF 25%, well compensated filling pressures with moderate to severely reduced CO (PCW 7, CI 2.07). cMRI showed LVEF 18%, mod dilated LV with septal-lateral dyssynchrony consistent with LBBB and diffuse severe HK. Normal RV, RVEF 35%. Mild-mod MR. Concern for LBBB CM. Discharged, weight 126.5 lbs.  Admitted 9/29 with progressive HF symptoms including dyspnea at rest. Symptoms started after contracting COVID 2 weeks prior. During that time he had stopped taking some of his meds and was drinking a lot of fluid. BNP > 4500. He was admitted and diuresed by Advanced Heart Failure service. GDMT titrated.   Has been to recent AHF follow ups. Compliance improving with Paramedicine team on board.   Presented to the ED 12/21 with sudden onset SOB and abd fullness. Worked his shift at [employer] that day and shortly after started to experience symptoms. Denies and sick contacts. Reports compliance with home meds. Admission labs reviewed: Pro-BNP 11.6K, K 5.1, SCr 1.41, HsTrop 125>214. CT chest (-) PE. CXR with mild interstitial edema.   Feels much better since being admitted. Ambulating in the room with no complaints. AHF team asked to see for Lactic acid that has not cleared since admission and SCr worsening. Diuresed ~10lbs.    Home Medications Prior to Admission medications  Medication Sig Start Date End Date Taking? Authorizing Provider  albuterol  (VENTOLIN  HFA) 108 (90  Base) MCG/ACT inhaler Inhale 1-2 puffs into the lungs every 6 (six) hours as needed for wheezing or shortness of breath. 03/13/24  Yes Smoot, Lauraine DELENA, PA-C  aspirin  EC 81 MG tablet Take 1 tablet (81 mg total) by mouth daily. Swallow whole. 12/06/23  Yes Milford, Harlene HERO, FNP  clotrimazole -betamethasone  (LOTRISONE ) cream Apply 1 Application topically 2 (two) times daily. 02/01/24  Yes Celestia Rosaline SQUIBB, NP  digoxin  (LANOXIN ) 0.125 MG tablet Take 1 tablet (0.125 mg total) by mouth daily. 05/23/24  Yes Zarria Towell, Toribio SAUNDERS, MD  empagliflozin  (JARDIANCE ) 10 MG TABS tablet Take 1 tablet (10 mg total) by mouth daily before breakfast. 05/17/24  Yes Colletta Manuelita Garre, PA-C  furosemide  (LASIX ) 40 MG tablet Take 1 tablet (40 mg total) by mouth daily. 05/23/24  Yes Annete Ayuso, Toribio SAUNDERS, MD  metoprolol  succinate (TOPROL -XL) 25 MG 24 hr tablet Take 1 tablet (25 mg total) by mouth at bedtime. Patient taking differently: Take 12.5 mg by mouth at bedtime. 06/20/24  Yes Lee, Jordan, NP  rosuvastatin  (CRESTOR ) 20 MG tablet Take 1 tablet (20 mg total) by mouth daily. 05/17/24  Yes Colletta Manuelita Garre, PA-C  sacubitril -valsartan  (ENTRESTO ) 24-26 MG Take 1 tablet by mouth 2 (two) times daily. 06/07/24  Yes Milford, Harlene HERO, FNP  spironolactone  (ALDACTONE ) 25 MG tablet Take 1 tablet (25 mg total) by mouth at bedtime. 05/21/24  Yes Lee, Jordan, NP  aspirin  EC 81 MG tablet Take 1 tablet (81 mg total) by mouth daily. Swallow whole. Patient not taking: Reported on 06/24/2024 03/13/24  Smoot, Sarah A, PA-C  hydrOXYzine  (ATARAX ) 10 MG tablet Take 1 tablet (10 mg total) by mouth 3 (three) times daily as needed. Patient not taking: No sig reported 02/27/24   Celestia Rosaline SQUIBB, NP  predniSONE  (DELTASONE ) 20 MG tablet Take 2 tablets (40 mg total) by mouth daily with breakfast. Patient not taking: Reported on 06/05/2024 04/05/24   Arrien, Elidia Sieving, MD    Past Medical History: Past Medical History:  Diagnosis Date    Asthma    Shellfish allergy     Past Surgical History: Past Surgical History:  Procedure Laterality Date   RIGHT/LEFT HEART CATH AND CORONARY ANGIOGRAPHY N/A 11/24/2023   Procedure: RIGHT/LEFT HEART CATH AND CORONARY ANGIOGRAPHY;  Surgeon: Cherrie Sieving SAUNDERS, MD;  Location: [facility] INVASIVE CV LAB;  Service: Cardiovascular;  Laterality: N/A;    Family History: Family History  Problem Relation Age of Onset   Diabetes Mother    Hypertension Mother    Diabetes Father    Hypertension Father     Social History: Social History   Socioeconomic History   Marital status: Single    Spouse name: Not on file   Number of children: Not on file   Years of education: Not on file   Highest education level: Not on file  Occupational History   Not on file  Tobacco Use   Smoking status: Some Days    Current packs/day: 0.25    Types: Cigarettes   Smokeless tobacco: Never  Vaping Use   Vaping status: Never Used  Substance and Sexual Activity   Alcohol use: Yes    Comment: weekends only   Drug use: No   Sexual activity: Not on file  Other Topics Concern   Not on file  Social History Narrative   ** Merged History Encounter **       Homeless at the time may smoke one cigarette a day   Social Drivers of Health   Tobacco Use: High Risk (06/23/2024)   Patient History    Smoking Tobacco Use: Some Days    Smokeless Tobacco Use: Never    Passive Exposure: Not on file  Financial Resource Strain: High Risk (04/12/2024)   Overall Financial Resource Strain (CARDIA)    Difficulty of Paying Living Expenses: Hard  Food Insecurity: Food Insecurity Present (06/24/2024)   Epic    Worried About Programme Researcher, Broadcasting/film/video in the Last Year: Sometimes true    Ran Out of Food in the Last Year: Sometimes true  Transportation Needs: No Transportation Needs (06/24/2024)   Epic    Lack of Transportation (Medical): No    Lack of Transportation (Non-Medical): No  Recent Concern: Transportation Needs - Unmet  Transportation Needs (06/04/2024)   Epic    Lack of Transportation (Medical): Yes    Lack of Transportation (Non-Medical): Yes  Physical Activity: Not on file  Stress: Stress Concern Present (06/04/2024)   [employer] of Occupational Health - Occupational Stress Questionnaire    Feeling of Stress: To some extent  Social Connections: Not on file  Depression (PHQ2-9): Medium Risk (06/04/2024)   Depression (PHQ2-9)    PHQ-2 Score: 9  Alcohol Screen: Not on file  Housing: High Risk (06/24/2024)   Epic    Unable to Pay for Housing in the Last Year: Yes    Number of Times Moved in the Last Year: 1    Homeless in the Last Year: Yes  Utilities: At Risk (06/24/2024)   Epic    Threatened with loss of  utilities: Yes  Health Literacy: Adequate Health Literacy (01/03/2024)   B1300 Health Literacy    Frequency of need for help with medical instructions: Rarely    Allergies:  Allergies[1]  Objective:   Vital Signs:   Temp:  [97.1 F (36.2 C)-98.4 F (36.9 C)] 98 F (36.7 C) (12/23 1540) Pulse Rate:  [90-102] 95 (12/23 1540) Resp:  [16-20] 20 (12/23 1540) BP: (102-112)/(68-73) 112/71 (12/23 1540) SpO2:  [98 %-100 %] 100 % (12/23 1540) FiO2 (%):  [21 %] 21 % (12/23 0208) Weight:  [57.8 kg] 57.8 kg (12/23 0350) Last BM Date : 06/25/24 (Per patient)  Weight change: Filed Weights   06/23/24 2212 06/24/24 1513 06/25/24 0350  Weight: 62.9 kg 57.9 kg 57.8 kg    Intake/Output:   Intake/Output Summary (Last 24 hours) at 06/25/2024 1621 Last data filed at 06/25/2024 1558 Gross per 24 hour  Intake 2040 ml  Output 1470 ml  Net 570 ml    Physical Exam  General:  well appearing. Resting in bed   Cor: Regular rate & rhythm. No murmurs. JVD flat.   Lungs: clear Extremities: no edema  Telemetry   NSR 90s (Personally reviewed)    Labs   Basic Metabolic Panel: Recent Labs  Lab 06/20/24 1519 06/23/24 2241 06/24/24 0026 06/24/24 0622 06/25/24 0333  NA 143 139 139  --  140  K  4.5 5.1 4.4  --  4.7  CL 104 106  --   --  101  CO2 28 22  --   --  22  GLUCOSE 97 89  --   --  120*  BUN 22* 17  --   --  29*  CREATININE 1.48* 1.41*  --  1.36* 1.78*  CALCIUM  9.0 8.3*  --   --  9.4  MG  --   --   --  2.2  --   PHOS  --   --   --  1.9*  --     Liver Function Tests: Recent Labs  Lab 06/25/24 0333 06/25/24 1329  AST 51* 43*  ALT 32 30  ALKPHOS 95 100  BILITOT 0.5 0.5  PROT 6.9 7.2  ALBUMIN 3.7 3.7   No results for input(s): LIPASE, AMYLASE in the last 168 hours. No results for input(s): AMMONIA in the last 168 hours.  CBC: Recent Labs  Lab 06/23/24 2241 06/24/24 0026 06/24/24 0622 06/25/24 0333  WBC 13.2*  --  12.8* 22.3*  HGB 11.5* 14.6 13.8 14.2  HCT 36.1* 43.0 43.8 44.2  MCV 75.7*  --  74.4* 74.2*  PLT 251  --  236 262    Cardiac Enzymes: No results for input(s): CKTOTAL, CKMB, CKMBINDEX, TROPONINI in the last 168 hours.  BNP: BNP (last 3 results) Recent Labs    04/01/24 1327 04/12/24 1014 05/21/24 1004  BNP >4,500.0* 3,217.5* 3,013.7*    ProBNP (last 3 results) Recent Labs    06/20/24 1519 06/23/24 2241  PROBNP 7,627.0* 11,603.0*     CBG: No results for input(s): GLUCAP in the last 168 hours.  Coagulation Studies: No results for input(s): LABPROT, INR in the last 72 hours.   Imaging   US  EKG SITE RITE Result Date: 06/25/2024 If Site Rite image not attached, placement could not be confirmed due to current cardiac rhythm.   Medications:   Current Medications:  azithromycin   500 mg Oral Daily   digoxin   0.125 mg Oral Daily   empagliflozin   10 mg Oral QAC breakfast   heparin   5,000 Units Subcutaneous Q8H   LORazepam   0.5 mg Intravenous Once   predniSONE   40 mg Oral Q breakfast   rosuvastatin   20 mg Oral Daily    Infusions:  Patient Profile   Jesse Key is a 58 y.o. male with hx of tobacco and alcohol use, and chronic systolic heart failure.  Assessment/Plan  1. Acute on chronic  systolic CHF> concern for CGS - Nonischemic cardiomyopathy. Echo in 5/25 showed EF < 20%, RV nl, global HK, mid-mod MR. RHC/LHC in 5/25 showed non-obstructive CAD, well-compensated filling pressures with CI 2.07.  Cardiac MRI in 5/25 showed LV EF 18%, RV EF 35%, nonspecific RV insertion site LGE.  Echo 9/25: EF 15-20%, RV mildly reduced, RVSP 55 mmHg, severe MR, severe TR, dilated IVC.  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  - Echo this admission with EF <20%, LV with GHK, RV normal, LA mod dilated, mild MR.  - NYHA IIIa on exam, IV on admission. - Appears euvolemic to hypovolemic on exam, would hold off on further diuresis.  - Will place PICC to follow CVP and co-ox. If PICC not placed today would benefit from ReDS clip tomorrow.  - Lactic acid 1.5>4.7>2.7>3.3>2.9. Continue to trend - Cut back digoxin  0.125>0.0625 daily with AKI. Dig level 0.8.  - Continue Jardiance  10 daily - Hold BB with acute exacerbation.  - Holding Spiro and Entresto  with AKI - Strict I&O, daily weights   2. LBBB - QRS 162 ms on last ECG - Possible CRT-D candidate   3. AKI on CKD stage 3b: Baseline ~1.5-1.6 - Mild AKI from admission SCR. SCr 1.48 on admission, up to 1.78 post diuresis. Still relatively close to baseline.   - Hold further diuresis.  - Avoid hypotension   4. 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   - No chest pain - Continue ASA, statin.    5. SDOH:  - poor health literacy - Enrolled in paramedicine, compliance much improved - May eventually need to consider disability; currently working at [employer]   Length of Stay: 1  Beckey LITTIE Coe, NP  06/25/2024, 4:21 PM  Advanced Heart Failure Team Pager [phone] (M-F; 7a - 5p)   Please visit [email or web address]: For overnight coverage please call cardiology fellow first. If fellow not available call Shock/ECMO MD on call.  For ECMO / Mechanical Support (Impella, IABP, LVAD) issues call Shock / ECMO MD on call.    Patient  seen and examined with the above-signed Advanced Practice Provider and/or Housestaff. I personally reviewed laboratory data, imaging studies and relevant notes. I independently examined the patient and formulated the important aspects of the plan. I have edited the note to reflect any of my changes or salient points. I have personally discussed the plan with the patient and/or family.  58 y/o male with severe systolic HF due to NICM (?LBBB) and CKD 3b admitted with recurrent HF  Developed lactic acidosis. Which is now clearing slowly. He is feeling better.   LA 3.3 -> 2.9 -> 2.7  Echo with EF < 20   General:  Sitting up in bed. No resp difficulty HEENT: normal Neck: supple. JVP hard to see Cor: Regular rate & rhythm. No rubs, gallops or murmurs. Lungs: clear Abdomen: soft, nontender, nondistended.Good bowel sounds. Extremities: no cyanosis, clubbing, rash, tr edema Neuro: alert & orientedx3, cranial nerves grossly intact. moves all 4 extremities w/o difficulty. Affect pleasant  High suspicion for low output HF. Awaiting PICC placement but  will start empiric milrinone . Will need RHC and further assessment for potential candidacy for advanced therapies.   Toribio Fuel, MD  8:05 PM     [1]  Allergies Allergen Reactions   Shrimp [Shellfish Allergy] Anaphylaxis and Itching    ALL SHELLFISH   Shellfish Allergy Itching   "

## 2024-06-25 NOTE — Progress Notes (Addendum)
 " PROGRESS NOTE  Jesse Key FMW:[id number] DOB: 05-21-1966 DOA: 06/23/2024 PCP: Celestia Rosaline SQUIBB, NP   LOS: 1 day   Brief Narrative / Interim history: 58 year old male with history of nonischemic cardiomyopathy with EF less than 20%, CKD 3B, nonobstructive CAD, tobacco use, prior EtOH, currently homeless living on the streets comes into the hospital with increased shortness of breath. Workup on admission was concerning for acute on chronic CHF, cardiology was consulted, he was placed on diuretics and admitted to the hospital   Subjective / 24h Interval events: Reports his breathing is improved today, but he gets extremely dizzy when he gets up and moves around.  Assesement and Plan: Principal problem Acute on chronic systolic CHF-most recent 2D echo showed EF less than 20%.  He has been placed on Lasix , diuresed well, weight has improved from 138 on admission to 127 this morning.  Cardiology consulted, appreciate input.  QRS is quite wide, I wonder whether he will need consideration for CRT, defer to cardiology.  2D echocardiogram done yesterday shows LVEF less than 20%, global hypokinesis, normal RV function and size - Lasix  on hold due to elevation in creatinine - He is on Entresto , digoxin .  Obtain orthostatics given reported dizziness and lightheadedness upon standing  - his lactic acid is elevated raising concern about poor perfusion.  Defer appropriateness of inotropes to cardiology team   Active problems Elevated troponin-likely demand ischemia in the setting of #1, no chest pain   Asthma/COPD-not wheezing on my evaluation, started on prednisone  on admission, continue.  Empiric azithromycin  noted as well.  Continue nebulizers - Would limit prednisone  for no more than couple additional days   CKD 3A-baseline creatinine 1.4-1.5, rightly high at 1.7, due to Lasix .  She is now on hold  Leukocytosis-probably due to steroids, he is afebrile   Homelessness-TOC consult.  He tells  me there is a wait list even for shelters and he could not get into anyone of them  Scheduled Meds:  azithromycin   500 mg Oral Daily   digoxin   0.125 mg Oral Daily   empagliflozin   10 mg Oral QAC breakfast   heparin   5,000 Units Subcutaneous Q8H   ipratropium-albuterol   3 mL Nebulization Q6H   LORazepam   0.5 mg Intravenous Once   predniSONE   40 mg Oral Q breakfast   rosuvastatin   20 mg Oral Daily   sacubitril -valsartan   1 tablet Oral BID   spironolactone   12.5 mg Oral QHS   Continuous Infusions: PRN Meds:.acetaminophen  **OR** acetaminophen , albuterol , ondansetron  **OR** ondansetron  (ZOFRAN ) IV  Current Outpatient Medications  Medication Instructions   albuterol  (VENTOLIN  HFA) 108 (90 Base) MCG/ACT inhaler 1-2 puffs, Inhalation, Every 6 hours PRN   Aspirin  Low Dose 81 mg, Oral, Daily, Swallow whole.   Aspirin  Low Dose 81 mg, Oral, Daily, Swallow whole.   clotrimazole -betamethasone  (LOTRISONE ) cream 1 Application, Topical, 2 times daily   digoxin  (LANOXIN ) 0.125 mg, Oral, Daily   furosemide  (LASIX ) 40 mg, Oral, Daily   hydrOXYzine  (ATARAX ) 10 mg, Oral, 3 times daily PRN   Jardiance  10 mg, Oral, Daily before breakfast   metoprolol  succinate (TOPROL -XL) 25 mg, Oral, Nightly   predniSONE  (DELTASONE ) 40 mg, Oral, Daily with breakfast   rosuvastatin  (CRESTOR ) 20 mg, Oral, Daily   sacubitril -valsartan  (ENTRESTO ) 24-26 MG 1 tablet, Oral, 2 times daily   spironolactone  (ALDACTONE ) 25 mg, Oral, Daily at bedtime    Diet Orders (From admission, onward)     Start     Ordered   06/24/24 0522  Diet Heart Room service appropriate? Yes; Fluid consistency: Thin  Diet effective now       Question Answer Comment  Room service appropriate? Yes   Fluid consistency: Thin      06/24/24 0524            DVT prophylaxis: heparin  injection 5,000 Units Start: 06/24/24 0600   Lab Results  Component Value Date   PLT 262 06/25/2024      Code Status: Full Code  Family Communication: No  family at bedside  Status is: Inpatient Remains inpatient appropriate because: Severity of illness   Level of care: Progressive  Consultants:  Cardiology  Objective: Vitals:   06/25/24 0010 06/25/24 0208 06/25/24 0350 06/25/24 0740  BP: 102/68  103/68 108/73  Pulse: (!) 102  90 91  Resp: 16  16 18   Temp: (!) 97.1 F (36.2 C)  97.8 F (36.6 C) (!) 97.3 F (36.3 C)  TempSrc: Temporal  Temporal Oral  SpO2: 100% 99% 100% 100%  Weight:   57.8 kg   Height:        Intake/Output Summary (Last 24 hours) at 06/25/2024 0952 Last data filed at 06/25/2024 0835 Gross per 24 hour  Intake 1560 ml  Output 1195 ml  Net 365 ml   Wt Readings from Last 3 Encounters:  06/25/24 57.8 kg  06/20/24 62.9 kg  06/12/24 60.8 kg    Examination:  Constitutional: NAD Eyes: no scleral icterus ENMT: Mucous membranes are moist.  Neck: normal, supple Respiratory: clear to auscultation bilaterally, no wheezing, no crackles.  Cardiovascular: Regular rate and rhythm, no murmurs / rubs / gallops. No LE edema.  Abdomen: non distended, no tenderness. Bowel sounds positive.  Musculoskeletal: no clubbing / cyanosis.   Data Reviewed: I have independently reviewed following labs and imaging studies   CBC Recent Labs  Lab 06/23/24 2241 06/24/24 0026 06/24/24 0622 06/25/24 0333  WBC 13.2*  --  12.8* 22.3*  HGB 11.5* 14.6 13.8 14.2  HCT 36.1* 43.0 43.8 44.2  PLT 251  --  236 262  MCV 75.7*  --  74.4* 74.2*  MCH 24.1*  --  23.4* 23.8*  MCHC 31.9  --  31.5 32.1  RDW 17.4*  --  18.1* 18.2*    Recent Labs  Lab 06/20/24 1519 06/23/24 2241 06/24/24 0026 06/24/24 0622 06/24/24 1418 06/25/24 0333 06/25/24 0816  NA 143 139 139  --   --  140  --   K 4.5 5.1 4.4  --   --  4.7  --   CL 104 106  --   --   --  101  --   CO2 28 22  --   --   --  22  --   GLUCOSE 97 89  --   --   --  120*  --   BUN 22* 17  --   --   --  29*  --   CREATININE 1.48* 1.41*  --  1.36*  --  1.78*  --   CALCIUM  9.0 8.3*   --   --   --  9.4  --   AST  --   --   --   --   --  51*  --   ALT  --   --   --   --   --  32  --   ALKPHOS  --   --   --   --   --  95  --  BILITOT  --   --   --   --   --  0.5  --   ALBUMIN  --   --   --   --   --  3.7  --   MG  --   --   --  2.2  --   --   --   PROCALCITON  --   --   --  0.12  --   --   --   LATICACIDVEN  --   --   --  1.5 4.7*  --  2.7*  TSH  --   --   --  1.000  --   --   --   HGBA1C  --   --   --  5.2  --   --   --     ------------------------------------------------------------------------------------------------------------------ No results for input(s): CHOL, HDL, LDLCALC, TRIG, CHOLHDL, LDLDIRECT in the last 72 hours.  Lab Results  Component Value Date   HGBA1C 5.2 06/24/2024   ------------------------------------------------------------------------------------------------------------------ Recent Labs    06/24/24 0622  TSH 1.000    Cardiac Enzymes No results for input(s): CKMB, TROPONINI, MYOGLOBIN in the last 168 hours.  Invalid input(s): CK ------------------------------------------------------------------------------------------------------------------    Component Value Date/Time   BNP 3,013.7 (H) 05/21/2024 1004    CBG: No results for input(s): GLUCAP in the last 168 hours.  Recent Results (from the past 240 hours)  Respiratory (~20 pathogens) panel by PCR     Status: None   Collection Time: 06/24/24  7:55 AM   Specimen: Nasopharyngeal Swab; Respiratory  Result Value Ref Range Status   Adenovirus NOT DETECTED NOT DETECTED Final   Coronavirus 229E NOT DETECTED NOT DETECTED Final    Comment: (NOTE) The Coronavirus on the Respiratory Panel, DOES NOT test for the novel  Coronavirus (2019 nCoV)    Coronavirus HKU1 NOT DETECTED NOT DETECTED Final   Coronavirus NL63 NOT DETECTED NOT DETECTED Final   Coronavirus OC43 NOT DETECTED NOT DETECTED Final   Metapneumovirus NOT DETECTED NOT DETECTED Final   Rhinovirus /  Enterovirus NOT DETECTED NOT DETECTED Final   Influenza A NOT DETECTED NOT DETECTED Final   Influenza B NOT DETECTED NOT DETECTED Final   Parainfluenza Virus 1 NOT DETECTED NOT DETECTED Final   Parainfluenza Virus 2 NOT DETECTED NOT DETECTED Final   Parainfluenza Virus 3 NOT DETECTED NOT DETECTED Final   Parainfluenza Virus 4 NOT DETECTED NOT DETECTED Final   Respiratory Syncytial Virus NOT DETECTED NOT DETECTED Final   Bordetella pertussis NOT DETECTED NOT DETECTED Final   Bordetella Parapertussis NOT DETECTED NOT DETECTED Final   Chlamydophila pneumoniae NOT DETECTED NOT DETECTED Final   Mycoplasma pneumoniae NOT DETECTED NOT DETECTED Final    Comment: Performed at [facility], [address] [place], [place] [address]     Radiology Studies: ECHOCARDIOGRAM COMPLETE Result Date: 06/24/2024    ECHOCARDIOGRAM REPORT   Patient Name:   Jesse Key Date of Exam: 06/24/2024 Medical Rec #:  [id number]        Height:       65.0 in Accession #:    [id number]       Weight:       138.7 lb Date of Birth:  March 27, 1966         BSA:          1.693 m Patient Age:    58 years         BP:  97/66 mmHg Patient Gender: M                HR:           89 bpm. Exam Location:  Inpatient Procedure: 2D Echo, Color Doppler, Cardiac Doppler and Intracardiac            Opacification Agent (Both Spectral and Color Flow Doppler were            utilized during procedure). Indications:    Chest pain R07.9  History:        Patient has prior history of Echocardiogram examinations, most                 recent 04/02/2024. Signs/Symptoms:Chest Pain.  Sonographer:    Sydnee Wilson RDCS Referring Phys: [id number] SARA-MAIZ A THOMAS IMPRESSIONS  1. Left ventricular ejection fraction, by estimation, is <20%. The left ventricle has severely decreased function. The left ventricle demonstrates global hypokinesis. The left ventricular internal cavity size was moderately dilated. Left ventricular diastolic parameters are  indeterminate.  2. Right ventricular systolic function is normal. The right ventricular size is normal.  3. Left atrial size was moderately dilated.  4. The mitral valve is normal in structure. Mild mitral valve regurgitation. No evidence of mitral stenosis.  5. The aortic valve is tricuspid. There is mild calcification of the aortic valve. Aortic valve regurgitation is trivial. Aortic valve sclerosis/calcification is present, without any evidence of aortic stenosis.  6. The inferior vena cava is normal in size with greater than 50% respiratory variability, suggesting right atrial pressure of 3 mmHg. FINDINGS  Left Ventricle: Left ventricular ejection fraction, by estimation, is <20%. The left ventricle has severely decreased function. The left ventricle demonstrates global hypokinesis. The left ventricular internal cavity size was moderately dilated. There is no left ventricular hypertrophy. Left ventricular diastolic parameters are indeterminate. Right Ventricle: The right ventricular size is normal. No increase in right ventricular wall thickness. Right ventricular systolic function is normal. Left Atrium: Left atrial size was moderately dilated. Right Atrium: Right atrial size was normal in size. Pericardium: There is no evidence of pericardial effusion. Mitral Valve: The mitral valve is normal in structure. Mild mitral valve regurgitation. No evidence of mitral valve stenosis. Tricuspid Valve: The tricuspid valve is normal in structure. Tricuspid valve regurgitation is trivial. No evidence of tricuspid stenosis. Aortic Valve: The aortic valve is tricuspid. There is mild calcification of the aortic valve. Aortic valve regurgitation is trivial. Aortic valve sclerosis/calcification is present, without any evidence of aortic stenosis. Aortic valve mean gradient measures 1.0 mmHg. Aortic valve peak gradient measures 3.2 mmHg. Pulmonic Valve: The pulmonic valve was normal in structure. Pulmonic valve regurgitation is  not visualized. No evidence of pulmonic stenosis. Aorta: The aortic root is normal in size and structure. Venous: The inferior vena cava is normal in size with greater than 50% respiratory variability, suggesting right atrial pressure of 3 mmHg. IAS/Shunts: There is right bowing of the interatrial septum, suggestive of elevated left atrial pressure. No atrial level shunt detected by color flow Doppler.  LEFT VENTRICLE PLAX 2D LVIDd:         6.00 cm      Diastology LVIDs:         5.30 cm      LV e' medial:    10.60 cm/s LV PW:         1.40 cm      LV E/e' medial:  12.9 LV IVS:  1.00 cm      LV e' lateral:   13.40 cm/s LVOT diam:     2.00 cm      LV E/e' lateral: 10.2 LVOT Area:     3.14 cm  LV Volumes (MOD) LV vol d, MOD A2C: 182.0 ml LV vol d, MOD A4C: 199.0 ml LV vol s, MOD A2C: 156.0 ml LV vol s, MOD A4C: 148.0 ml LV SV MOD A2C:     26.0 ml LV SV MOD A4C:     199.0 ml LV SV MOD BP:      38.6 ml RIGHT VENTRICLE RV S prime:     12.30 cm/s TAPSE (M-mode): 1.9 cm LEFT ATRIUM             Index        RIGHT ATRIUM           Index LA diam:        3.40 cm 2.01 cm/m   RA Area:     12.30 cm LA Vol (A2C):   50.3 ml 29.71 ml/m  RA Volume:   27.50 ml  16.24 ml/m LA Vol (A4C):   42.1 ml 24.87 ml/m LA Biplane Vol: 48.9 ml 28.88 ml/m  AORTIC VALVE AV Vmax:      90.10 cm/s AV Vmean:     50.200 cm/s AV VTI:       0.150 m AV Peak Grad: 3.2 mmHg AV Mean Grad: 1.0 mmHg  AORTA Ao Root diam: 3.10 cm Ao Asc diam:  2.60 cm MITRAL VALVE                TRICUSPID VALVE MV Area (PHT): 6.48 cm     TR Peak grad:   15.2 mmHg MV Decel Time: 117 msec     TR Vmax:        195.00 cm/s MV E velocity: 137.00 cm/s MV A velocity: 28.30 cm/s   SHUNTS MV E/A ratio:  4.84         Systemic Diam: 2.00 cm Toribio Fuel MD Electronically signed by Toribio Fuel MD Signature Date/Time: 06/24/2024/6:49:52 PM    Final      Nilda Fendt, MD, PhD [facility]  Between 7 am - 7 pm I am available, please contact me via Amion (for  emergencies) or Securechat (non urgent messages)  Between 7 pm - 7 am I am not available, please contact night coverage MD/APP via Amion  "

## 2024-06-26 ENCOUNTER — Other Ambulatory Visit (HOSPITAL_COMMUNITY): Payer: Self-pay

## 2024-06-26 DIAGNOSIS — I2510 Atherosclerotic heart disease of native coronary artery without angina pectoris: Secondary | ICD-10-CM

## 2024-06-26 DIAGNOSIS — I5023 Acute on chronic systolic (congestive) heart failure: Secondary | ICD-10-CM | POA: Diagnosis not present

## 2024-06-26 DIAGNOSIS — J45909 Unspecified asthma, uncomplicated: Secondary | ICD-10-CM | POA: Diagnosis not present

## 2024-06-26 DIAGNOSIS — F1010 Alcohol abuse, uncomplicated: Secondary | ICD-10-CM | POA: Diagnosis not present

## 2024-06-26 DIAGNOSIS — N1831 Chronic kidney disease, stage 3a: Secondary | ICD-10-CM

## 2024-06-26 DIAGNOSIS — I10 Essential (primary) hypertension: Secondary | ICD-10-CM

## 2024-06-26 LAB — COMPREHENSIVE METABOLIC PANEL WITH GFR
ALT: 27 U/L (ref 0–44)
AST: 30 U/L (ref 15–41)
Albumin: 3.6 g/dL (ref 3.5–5.0)
Alkaline Phosphatase: 86 U/L (ref 38–126)
Anion gap: 10 (ref 5–15)
BUN: 42 mg/dL — ABNORMAL HIGH (ref 6–20)
CO2: 26 mmol/L (ref 22–32)
Calcium: 9.4 mg/dL (ref 8.9–10.3)
Chloride: 103 mmol/L (ref 98–111)
Creatinine, Ser: 1.61 mg/dL — ABNORMAL HIGH (ref 0.61–1.24)
GFR, Estimated: 49 mL/min — ABNORMAL LOW
Glucose, Bld: 118 mg/dL — ABNORMAL HIGH (ref 70–99)
Potassium: 4.8 mmol/L (ref 3.5–5.1)
Sodium: 139 mmol/L (ref 135–145)
Total Bilirubin: 0.4 mg/dL (ref 0.0–1.2)
Total Protein: 7 g/dL (ref 6.5–8.1)

## 2024-06-26 LAB — LACTIC ACID, PLASMA
Lactic Acid, Venous: 2.4 mmol/L (ref 0.5–1.9)
Lactic Acid, Venous: 1.7 mmol/L (ref 0.5–1.9)

## 2024-06-26 LAB — RESP PANEL BY RT-PCR (RSV, FLU A&B, COVID)  RVPGX2
Influenza A by PCR: NEGATIVE
Influenza B by PCR: NEGATIVE
Resp Syncytial Virus by PCR: NEGATIVE
SARS Coronavirus 2 by RT PCR: NEGATIVE

## 2024-06-26 LAB — URINE DRUG SCREEN
Amphetamines: NEGATIVE
Barbiturates: NEGATIVE
Benzodiazepines: NEGATIVE
Cocaine: POSITIVE — AB
Fentanyl: NEGATIVE
Methadone Scn, Ur: NEGATIVE
Opiates: NEGATIVE
Tetrahydrocannabinol: NEGATIVE

## 2024-06-26 LAB — CBC WITH DIFFERENTIAL/PLATELET
Abs Immature Granulocytes: 0.15 K/uL — ABNORMAL HIGH (ref 0.00–0.07)
Basophils Absolute: 0 K/uL (ref 0.0–0.1)
Basophils Relative: 0 %
Eosinophils Absolute: 0 K/uL (ref 0.0–0.5)
Eosinophils Relative: 0 %
HCT: 44.2 % (ref 39.0–52.0)
Hemoglobin: 14.5 g/dL (ref 13.0–17.0)
Immature Granulocytes: 1 %
Lymphocytes Relative: 9 %
Lymphs Abs: 1.9 K/uL (ref 0.7–4.0)
MCH: 24.1 pg — ABNORMAL LOW (ref 26.0–34.0)
MCHC: 32.8 g/dL (ref 30.0–36.0)
MCV: 73.4 fL — ABNORMAL LOW (ref 80.0–100.0)
Monocytes Absolute: 1.1 K/uL — ABNORMAL HIGH (ref 0.1–1.0)
Monocytes Relative: 5 %
Neutro Abs: 18.6 K/uL — ABNORMAL HIGH (ref 1.7–7.7)
Neutrophils Relative %: 85 %
Platelets: 265 K/uL (ref 150–400)
RBC: 6.02 MIL/uL — ABNORMAL HIGH (ref 4.22–5.81)
RDW: 18.1 % — ABNORMAL HIGH (ref 11.5–15.5)
WBC: 21.8 K/uL — ABNORMAL HIGH (ref 4.0–10.5)
nRBC: 0.1 % (ref 0.0–0.2)

## 2024-06-26 LAB — COOXEMETRY PANEL
Carboxyhemoglobin: 2.1 % — ABNORMAL HIGH (ref 0.5–1.5)
Carboxyhemoglobin: 2.2 % — ABNORMAL HIGH (ref 0.5–1.5)
Carboxyhemoglobin: 2.3 % — ABNORMAL HIGH (ref 0.5–1.5)
Methemoglobin: <0.7 % (ref 0.0–1.5)
Methemoglobin: <0.7 % (ref 0.0–1.5)
Methemoglobin: <0.7 % (ref 0.0–1.5)
O2 Saturation: 66.4 %
O2 Saturation: 71.1 %
O2 Saturation: 79.2 %
Total hemoglobin: 14.3 g/dL (ref 12.0–16.0)
Total hemoglobin: 14.6 g/dL (ref 12.0–16.0)
Total hemoglobin: 15.4 g/dL (ref 12.0–16.0)

## 2024-06-26 LAB — MAGNESIUM: Magnesium: 2.4 mg/dL (ref 1.7–2.4)

## 2024-06-26 MED ORDER — ALUM & MAG HYDROXIDE-SIMETH 200-200-20 MG/5ML PO SUSP: 15.0000 mL | Freq: Four times a day (QID) | ORAL | Status: DC | PRN

## 2024-06-26 MED FILL — Rosuvastatin Calcium Tab 20 MG: ORAL | 30 days supply | Qty: 30 | Fill #1 | Status: AC

## 2024-06-26 MED FILL — Empagliflozin Tab 10 MG: ORAL | 30 days supply | Qty: 30 | Fill #1 | Status: AC

## 2024-06-26 NOTE — Progress Notes (Addendum)
 "    Advanced Heart Failure Rounding Note  Cardiologist: Soyla DELENA Merck, MD  [facility] Cardiologist: Dr. Cherrie Chief Complaint: CHF Patient Profile   Jesse Key is a 58 y.o. male with history of tobacco and alcohol use, and chronic systolic heart failure.   Significant events:   12/23: added milrinone    Subjective:    Co-ox 79% on milrinone  0.125. CVP 4-5. Weight up 6 lb. WBC 12.8 > 22.3 >21.8  Feeling better today. Remains with cough, no shortness of breath. Edema improved. Reports that him and his cousin were kicked out of their place on Saturday, he has intermittently been staying in hotel.  Objective:    Weight Range: 61.5 kg Body mass index is 22.56 kg/m.   Vital Signs:   Temp:  [97.3 F (36.3 C)-98.1 F (36.7 C)] 98.1 F (36.7 C) (12/24 0731) Pulse Rate:  [90-101] 90 (12/24 0731) Resp:  [17-20] 20 (12/24 0731) BP: (101-120)/(65-78) 120/76 (12/24 0731) SpO2:  [95 %-100 %] 98 % (12/24 0731) Weight:  [61.5 kg] 61.5 kg (12/24 0315) Last BM Date : 06/25/24 (Per patient)  Weight change: Filed Weights   06/24/24 1513 06/25/24 0350 06/26/24 0315  Weight: 57.9 kg 57.8 kg 61.5 kg   Intake/Output:  Intake/Output Summary (Last 24 hours) at 06/26/2024 0807 Last data filed at 06/26/2024 0600 Gross per 24 hour  Intake 1334.67 ml  Output 1655 ml  Net -320.33 ml    Physical Exam   General:  Well appearing.   Cor: Regular rate & rhythm. No murmurs. JVD flat.  Lungs: clear Extremities: no edema   Telemetry   SR 80s (personally reviewed)  Labs   CBC Recent Labs    06/25/24 0333 06/26/24 0315  WBC 22.3* 21.8*  NEUTROABS  --  18.6*  HGB 14.2 14.5  HCT 44.2 44.2  MCV 74.2* 73.4*  PLT 262 265   Basic Metabolic Panel Recent Labs    87/77/74 0622 06/25/24 0333 06/26/24 0315  NA  --  140 139  K  --  4.7 4.8  CL  --  101 103  CO2  --  22 26  GLUCOSE  --  120* 118*  BUN  --  29* 42*  CREATININE 1.36* 1.78* 1.61*  CALCIUM   --  9.4 9.4  MG 2.2   --  2.4  PHOS 1.9*  --   --    Liver Function Tests Recent Labs    06/25/24 1329 06/26/24 0315  AST 43* 30  ALT 30 27  ALKPHOS 100 86  BILITOT 0.5 0.4  PROT 7.2 7.0  ALBUMIN 3.7 3.6   BNP (last 3 results) Recent Labs    04/01/24 1327 04/12/24 1014 05/21/24 1004  BNP >4,500.0* 3,217.5* 3,013.7*   ProBNP (last 3 results) Recent Labs    06/20/24 1519 06/23/24 2241  PROBNP 7,627.0* 11,603.0*   Hemoglobin A1C Recent Labs    06/24/24 0622  HGBA1C 5.2   Medications:    Scheduled Medications:  Chlorhexidine  Gluconate Cloth  6 each Topical Daily   digoxin   0.0625 mg Oral Daily   empagliflozin   10 mg Oral QAC breakfast   heparin   5,000 Units Subcutaneous Q8H   LORazepam   0.5 mg Intravenous Once   predniSONE   40 mg Oral Q breakfast   rosuvastatin   20 mg Oral Daily   sodium chloride  flush  10-40 mL Intracatheter Q12H    Infusions:  milrinone  0.125 mcg/kg/min (06/26/24 0400)    PRN Medications: acetaminophen  **OR** acetaminophen , albuterol , ondansetron  **OR**  ondansetron  (ZOFRAN ) IV, sodium chloride  flush  Assessment/Plan   1. 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 Echo in 5/25 showed EF < 20%, RV nl, global HK, mid-mod MR. RHC/LHC in 5/25 showed non-obstructive CAD, well-compensated filling pressures with CI 2.07.  Cardiac MRI in 5/25 showed LV EF 18%, RV EF 35%, nonspecific RV insertion site LGE.  Echo 9/25: EF 15-20%, RV mildly reduced, RVSP 55 mmHg, severe MR, severe TR, dilated IVC.  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  - Echo this admission with EF <20%, LV with GHK, RV normal, LA mod dilated, mild MR.  - NYHA IIIa on exam, IV on admission. - Euvolemic on exam. CVP low. - Coox 80% on milrinone  0.125. Stop this morning.  - Lactic acid 1.5>4.7>2.7>3.3>2.9. Repeat this am.  - continue digoxin  0.0625 mg daily - continue Jardiance  10 daily - Hold BB with acute exacerbation.  - Holding Spiro  and Entresto  with AKI - Strict I&O, daily weights - With improved compliance would be a potential advanced therapies candidate, however stable housing has been an on/off issue for him.    2. LBBB - QRS 162 ms on last ECG - Possible CRT-D candidate   3. AKI on CKD stage 3b: Baseline ~1.5-1.6 - Mild AKI from admission SCR. SCr 1.48 on admission, up to 1.78 post diuresis. Still relatively close to baseline.   - Hold further diuresis.  - Avoid hypotension   4. 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   - No chest pain - Continue ASA, statin.   5. Leukocytosis - WBC 12.8>22.3>21.8. afebrile - CXR without obvious signs of PNA, continues with cough despite improved fluid - on azithro - check COVID/flu panel   5. SDOH:  - poor health literacy - Enrolled in paramedicine, compliance much improved - May eventually need to consider disability; currently working at [employer] - No longer has housing; has been staying in a motel.    Length of Stay: 2  [name], NP  06/26/2024, 8:07 AM  Advanced Heart Failure Team Pager [phone] (M-F; 7a - 5p)   Please visit [email or web address]: For overnight coverage please call cardiology fellow first. If fellow not available call Shock/ECMO MD on call.  For ECMO / Mechanical Support (Impella, IABP, LVAD) issues call Shock / ECMO MD on call.   Patient seen and examined with the above-signed Advanced Practice Provider and/or Housestaff. I personally reviewed laboratory data, imaging studies and relevant notes. I independently examined the patient and formulated the important aspects of the plan. I have edited the note to reflect any of my changes or salient points. I have personally discussed the plan with the patient and/or family.  Treated with milrinone  overnight. Now feels much better. Output and volume status look good. Lactic acid has cleared (1.7)  General:  Sitting up in bed. No resp difficulty HEENT: normal Neck: supple. no JVD.  Cor: Regular  rate & rhythm. No rubs, gallops or murmurs. Lungs: clear Abdomen: soft, nontender, nondistended.Good bowel sounds. Extremities: no cyanosis, clubbing, rash, edema Neuro: alert & orientedx3, cranial nerves grossly intact. moves all 4 extremities w/o difficulty. Affect pleasant  He is improved. Agree with stopping milrinone  and following CVP and co-ox. Hold diuretics for now. Reassess in am.   Toribio Fuel, MD  6:03 PM  "

## 2024-06-26 NOTE — Assessment & Plan Note (Signed)
 AKI, hyperkalemia   At the time of discharge his renal function is stable with serum cr at 1,36 with K at 4,6 and serum bicarbonate at 25  Na 139 and Mg 2,3   Continue furosemide  and SGLT 2 inh Follow up renal function and electrolytes as outpatient

## 2024-06-26 NOTE — Assessment & Plan Note (Signed)
 No signs of withdrawal

## 2024-06-26 NOTE — Hospital Course (Addendum)
 Mr. Morimoto was admitted to the hospital with the working diagnosis of heart failure exacerbation.   58 year old male with history of nonischemic cardiomyopathy with EF less than 20%, CKD  nonobstructive CAD, tobacco use, prior EtOH, currently homeless living on the streets comes into the hospital with increased shortness of breath.

## 2024-06-26 NOTE — Assessment & Plan Note (Addendum)
 Improved air movement  Plan to hold on antibiotic therapy and steroids.  Continue bronchodilator therapy and oxymetry monitoring   Reactive leukocytosis, no signs of bacterial infection.  Continue to hold on antibiotic therapy Off steroids wbc is trending down

## 2024-06-26 NOTE — Progress Notes (Signed)
 Mobility Specialist Progress Note:    06/26/24 1021  Mobility  Activity Ambulated with assistance  Level of Assistance Standby assist, set-up cues, supervision of patient - no hands on  Assistive Device Other (Comment) (IV Pole)  Distance Ambulated (ft) 100 ft  Range of Motion/Exercises Active  Activity Response Tolerated well  Mobility Referral Yes  Mobility visit 1 Mobility  Mobility Specialist Start Time (ACUTE ONLY) 1021  Mobility Specialist Stop Time (ACUTE ONLY) 1029  Mobility Specialist Time Calculation (min) (ACUTE ONLY) 8 min   Received pt ambulating in room agreeable to session. No c/o any symptoms. Pt moving and ambulating decently. Pt fatigues quickly. Returned pt to room w/ all needs met.   Venetia Keel Mobility Specialist Please Neurosurgeon or Rehab Office at [phone]

## 2024-06-26 NOTE — Assessment & Plan Note (Signed)
 Continue blood pressure monitoring  Continue metoprolol  50 mg succinate bid .

## 2024-06-26 NOTE — Progress Notes (Signed)
 Heart Failure Progress Update  Spoke with Dede, Paramedic that follows up on Jesse Key through [facility] program. Patient has been without his medications since Saturday when he was kicked out of his family's home. When Dede when to pick up his medications from the home, his family refused to given them her so that he would have him. She was able to refill the jardiance , spiro, and crestor . Will follow up at discharge to confirm that he will have access to his medications.   Jojo Geving, NP 06/26/2024

## 2024-06-26 NOTE — Assessment & Plan Note (Signed)
 No acute coronary syndrome.

## 2024-06-26 NOTE — Progress Notes (Addendum)
" °  Progress Note   Patient: Jesse Key FMW:[id number] DOB: 05-08-1966 DOA: 06/23/2024     2 DOS: the patient was seen and examined on 06/26/2024   Brief hospital course: Jesse Key was admitted to the hospital with the working diagnosis of heart failure exacerbation.   58 year old male with history of nonischemic cardiomyopathy with EF less than 20%, CKD  nonobstructive CAD, tobacco use, prior EtOH, currently homeless living on the streets comes into the hospital with increased shortness of breath.    Assessment and Plan: * Acute on chronic systolic CHF (congestive heart failure) (HCC) Echocardiogram with reduced LV systolic function less than 20%, global hypokinesis, moderate dilatation of LV cavity, RV systolic function preserved, LA with moderate dilatation, no significant valvular disease.   Urine output 1,655 ml Systolic blood pressure 109 mmHg Sv02 66.4   Continue medical therapy with digoxin , SGLT 2 inh,    CAD (coronary artery disease) No acute coronary syndrome.   Essential hypertension Continue blood pressure monitoring   Chronic kidney disease, stage 3a (HCC) AKI  Renal function with serum cr at 1.61, K at 4,8 and serum bicarbonate at 26  Na 139 and Mg 2.4   Plan to continue close monitoring renal function and electrolytes.   ETOH abuse No signs of withdrawal.   Asthma, chronic Improved air movement  Plan to hold on antibiotic therapy and steroids.  Continue bronchodilator therapy and oxymetry monitoring   Reactive leukocytosis, no signs of bacterial infection.  Continue to hold on antibiotic therapy Discontinue systemic steroids.         Subjective: Patient is feeling better, dyspnea and edema have improved, he has no PND, orthopnea or lower extremity edema   Physical Exam: Vitals:   06/26/24 0731 06/26/24 1044 06/26/24 1057 06/26/24 1519  BP: 120/76  117/78 109/72  Pulse: 90 97 97 96  Resp: 20  20 20   Temp: 98.1 F (36.7 C)  98.3 F (36.8  C) 98.4 F (36.9 C)  TempSrc: Oral  Oral Oral  SpO2: 98%  98% 95%  Weight:      Height:       Neurology awake and alert ENT with mild pallor Cardiovascular with S1 and S2 present and regular with no gallops, rubs or murmurs Mild JVD Respiratory with rales at bases with no wheezing or rhonchi Abdomen with no distention, soft and non tender No lower extremity edema   Data Reviewed:    Family Communication: no family at the bedside   Disposition: Status is: Inpatient Remains inpatient appropriate because: heart failure recovery   Planned Discharge Destination: Home     Author: Elidia Toribio Furnace, MD 06/26/2024 4:41 PM  For on call review [email or web address].  "

## 2024-06-26 NOTE — Assessment & Plan Note (Addendum)
 Echocardiogram with reduced LV systolic function less than 20%, global hypokinesis, moderate dilatation of LV cavity, RV systolic function preserved, LA with moderate dilatation, no significant valvular disease.   Urine output 1,675 ml Systolic blood pressure 117 mmHg Sv02 69.6   Continue medical therapy with digoxin  and SGLT 2  Inh. Limited medical therapy due to risk of hypotension and acutely reduced GFR.

## 2024-06-27 DIAGNOSIS — I2510 Atherosclerotic heart disease of native coronary artery without angina pectoris: Secondary | ICD-10-CM | POA: Diagnosis not present

## 2024-06-27 DIAGNOSIS — N1831 Chronic kidney disease, stage 3a: Secondary | ICD-10-CM | POA: Diagnosis not present

## 2024-06-27 DIAGNOSIS — I10 Essential (primary) hypertension: Secondary | ICD-10-CM | POA: Diagnosis not present

## 2024-06-27 DIAGNOSIS — I5023 Acute on chronic systolic (congestive) heart failure: Secondary | ICD-10-CM | POA: Diagnosis not present

## 2024-06-27 LAB — BASIC METABOLIC PANEL WITH GFR
Anion gap: 9 (ref 5–15)
BUN: 30 mg/dL — ABNORMAL HIGH (ref 6–20)
CO2: 25 mmol/L (ref 22–32)
Calcium: 9.1 mg/dL (ref 8.9–10.3)
Chloride: 104 mmol/L (ref 98–111)
Creatinine, Ser: 1.38 mg/dL — ABNORMAL HIGH (ref 0.61–1.24)
GFR, Estimated: 59 mL/min — ABNORMAL LOW
Glucose, Bld: 107 mg/dL — ABNORMAL HIGH (ref 70–99)
Potassium: 4.4 mmol/L (ref 3.5–5.1)
Sodium: 138 mmol/L (ref 135–145)

## 2024-06-27 LAB — CBC
HCT: 46.3 % (ref 39.0–52.0)
Hemoglobin: 14.6 g/dL (ref 13.0–17.0)
MCH: 23.4 pg — ABNORMAL LOW (ref 26.0–34.0)
MCHC: 31.5 g/dL (ref 30.0–36.0)
MCV: 74.1 fL — ABNORMAL LOW (ref 80.0–100.0)
Platelets: 244 K/uL (ref 150–400)
RBC: 6.25 MIL/uL — ABNORMAL HIGH (ref 4.22–5.81)
RDW: 18.2 % — ABNORMAL HIGH (ref 11.5–15.5)
WBC: 17.2 K/uL — ABNORMAL HIGH (ref 4.0–10.5)
nRBC: 0.1 % (ref 0.0–0.2)

## 2024-06-27 LAB — COOXEMETRY PANEL
Carboxyhemoglobin: 1.9 % — ABNORMAL HIGH (ref 0.5–1.5)
Methemoglobin: <0.7 % (ref 0.0–1.5)
O2 Saturation: 69.6 %
Total hemoglobin: 14.8 g/dL (ref 12.0–16.0)

## 2024-06-27 LAB — MAGNESIUM: Magnesium: 2.6 mg/dL — ABNORMAL HIGH (ref 1.7–2.4)

## 2024-06-27 MED ORDER — SACUBITRIL-VALSARTAN 24-26 MG PO TABS
1.0000 | ORAL_TABLET | Freq: Two times a day (BID) | ORAL | Status: DC
  Administered 2024-06-28: 1 via ORAL
  Filled 2024-06-27: qty 1

## 2024-06-27 MED ORDER — PANTOPRAZOLE SODIUM 40 MG PO TBEC
40.0000 mg | DELAYED_RELEASE_TABLET | Freq: Every day | ORAL | Status: DC
  Administered 2024-06-27 – 2024-06-28 (×2): 40 mg via ORAL
  Filled 2024-06-27 (×2): qty 1

## 2024-06-27 NOTE — Progress Notes (Addendum)
 "    Advanced Heart Failure Rounding Note  Cardiologist: Soyla DELENA Merck, MD  AHF Cardiologist: Dr. Cherrie Chief Complaint: CHF Patient Profile   Jesse Key is a 58 y.o. male with history of tobacco and alcohol use, and chronic systolic heart failure.   Significant events:   12/23: added milrinone    Subjective:    Off milrinone . Feels good  CVP 5  Co-ox 70   Objective:    Weight Range: 59.1 kg Body mass index is 21.68 kg/m.   Vital Signs:   Temp:  [97.3 F (36.3 C)-98.2 F (36.8 C)] 98.2 F (36.8 C) (12/25 2013) Pulse Rate:  [85-96] 85 (12/25 2013) Resp:  [16-20] 18 (12/25 2013) BP: (110-127)/(71-92) 121/74 (12/25 2013) SpO2:  [95 %-100 %] 100 % (12/25 2013) Weight:  [59.1 kg] 59.1 kg (12/25 0415) Last BM Date : 06/26/24  Weight change: Filed Weights   06/25/24 0350 06/26/24 0315 06/27/24 0415  Weight: 57.8 kg 61.5 kg 59.1 kg   Intake/Output:  Intake/Output Summary (Last 24 hours) at 06/27/2024 2049 Last data filed at 06/27/2024 1842 Gross per 24 hour  Intake 343.15 ml  Output 2650 ml  Net -2306.85 ml    Physical Exam   General:  Sitting up in bed. No resp difficulty HEENT: normal Neck: supple. no JVD.  Cor: Regular rate & rhythm. No rubs, gallops or murmurs. Lungs: clear Abdomen: soft, nontender, nondistended.Good bowel sounds. Extremities: no cyanosis, clubbing, rash, edema Neuro: alert & orientedx3, cranial nerves grossly intact. moves all 4 extremities w/o difficulty. Affect pleasant   Telemetry   SR 880s (personally reviewed)  Labs   CBC Recent Labs    06/26/24 0315 06/27/24 0505  WBC 21.8* 17.2*  NEUTROABS 18.6*  --   HGB 14.5 14.6  HCT 44.2 46.3  MCV 73.4* 74.1*  PLT 265 244   Basic Metabolic Panel Recent Labs    87/75/74 0315 06/27/24 0505  NA 139 138  K 4.8 4.4  CL 103 104  CO2 26 25  GLUCOSE 118* 107*  BUN 42* 30*  CREATININE 1.61* 1.38*  CALCIUM  9.4 9.1  MG 2.4 2.6*   Liver Function Tests Recent Labs     06/25/24 1329 06/26/24 0315  AST 43* 30  ALT 30 27  ALKPHOS 100 86  BILITOT 0.5 0.4  PROT 7.2 7.0  ALBUMIN 3.7 3.6   BNP (last 3 results) Recent Labs    04/01/24 1327 04/12/24 1014 05/21/24 1004  BNP >4,500.0* 3,217.5* 3,013.7*   ProBNP (last 3 results) Recent Labs    06/20/24 1519 06/23/24 2241  PROBNP 7,627.0* 11,603.0*   Hemoglobin A1C No results for input(s): HGBA1C in the last 72 hours.  Medications:    Scheduled Medications:  Chlorhexidine  Gluconate Cloth  6 each Topical Daily   digoxin   0.0625 mg Oral Daily   empagliflozin   10 mg Oral QAC breakfast   heparin   5,000 Units Subcutaneous Q8H   pantoprazole   40 mg Oral Daily   rosuvastatin   20 mg Oral Daily   sodium chloride  flush  10-40 mL Intracatheter Q12H    Infusions:    PRN Medications: acetaminophen  **OR** acetaminophen , albuterol , alum & mag hydroxide-simeth, ondansetron  **OR** ondansetron  (ZOFRAN ) IV, sodium chloride  flush  Assessment/Plan   1. 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 Echo in 5/25 showed EF < 20%, RV nl, global HK, mid-mod MR. RHC/LHC in 5/25 showed non-obstructive CAD, well-compensated filling pressures with CI 2.07.  Cardiac MRI in 5/25 showed LV  EF 18%, RV EF 35%, nonspecific RV insertion site LGE.  Echo 9/25: EF 15-20%, RV mildly reduced, RVSP 55 mmHg, severe MR, severe TR, dilated IVC.  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  - Echo this admission with EF <20%, LV with GHK, RV normal, LA mod dilated, mild MR.  - NYHA IIIa on exam, IV on admission. - Off milrinone . Co-ox and CVP good - Restart Entresto  24/26  - Continue dig and Jardiance  - No b-blocker with low output  2. LBBB - QRS 162 ms on last ECG - Possible CRT-D candidate   3. AKI on CKD stage 3b: Baseline ~1.5-1.6 - Mild AKI from admission SCR. SCr 1.48 on admission, up to 1.78 post diuresis. Still relatively close to baseline.   - Improved back  to baseline   4. 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   - No s/s angina - Continue ASA, statin.   5. Leukocytosis - WBC 12.8>22.3>21.8> 17.2. afebrile - CXR without obvious signs of PNA, continues with cough despite improved fluid - on azithro - COVID/flu panel negative   6. SDOH:  - poor health literacy - Enrolled in paramedicine, compliance much improved - May eventually need to consider disability; currently working at [employer] - No longer has housing; has been staying in a motel.    Length of Stay: 3  Toribio Fuel, MD  06/27/2024, 8:49 PM  Advanced Heart Failure Team Pager [phone] (M-F; 7a - 5p)   Please visit [email or web address]: For overnight coverage please call cardiology fellow first. If fellow not available call Shock/ECMO MD on call.  For ECMO / Mechanical Support (Impella, IABP, LVAD) issues call Shock / ECMO MD on call.     "

## 2024-06-27 NOTE — TOC Initial Note (Addendum)
 Transition of Care [facility]) - Initial/Assessment Note    Patient Details  Name: Jesse Key MRN: [id number] Date of Birth: [birth year]  Transition of Care [facility]) CM/SW Contact:    Justina Delcia Czar, RN Phone Number: [phone] 06/27/2024, 5:28 PM  Clinical Narrative:                 Spoke to pt and states he currently homeless. Provided pt with resources for [facility], shelters, and housing. States he has applied for disability through [facility].  Has HF Paramedicine. States he has scale but his cousin will not give him his belongings. Does not want scale now because he want be able to carry around.  Provided pt with pillbox.  Patient does not have a scale for daily weight monitoring. Provided pt with Living Better with Heart Failure booklet. Patient educated on the importance of daily weights, medication adherence, and following a low-sodium, heart-healthy diet.   Expected Discharge Plan: Home/Self Care Barriers to Discharge: Continued Medical Work up   Patient Goals and CMS Choice   Expected Discharge Plan and Services   Discharge Planning Services: CM Consult   Living arrangements for the past 2 months: Homeless   Prior Living Arrangements/Services Living arrangements for the past 2 months: Homeless   Patient language and need for interpreter reviewed:: Yes Do you feel safe going back to the place where you live?: Yes      Need for Family Participation in Patient Care: No (Comment) Care giver support system in place?: No (comment)   Criminal Activity/Legal Involvement Pertinent to Current Situation/Hospitalization: No - Comment as needed  Activities of Daily Living   ADL Screening (condition at time of admission) Independently performs ADLs?: Yes (appropriate for developmental age) Is the patient deaf or have difficulty hearing?: No Does the patient have difficulty seeing, even when wearing glasses/contacts?: No Does the patient have difficulty concentrating,  remembering, or making decisions?: No  Permission Sought/Granted Permission sought to share information with : Case Manager, Family Supports, PCP Permission granted to share information with : Yes, Verbal Permission Granted    Emotional Assessment Appearance:: Appears stated age Attitude/Demeanor/Rapport: Engaged Affect (typically observed): Accepting Orientation: : Oriented to Self, Oriented to Place, Oriented to  Time, Oriented to Situation   Psych Involvement: No (comment)  Admission diagnosis:  Shortness of breath [R06.02] CHF (congestive heart failure) (HCC) [I50.9] Hypervolemia, unspecified hypervolemia type [E87.70] Acute on chronic congestive heart failure, unspecified heart failure type (HCC) [I50.9] Patient Active Problem List   Diagnosis Date Noted   Asthma, chronic 06/26/2024   AKI (acute kidney injury) 06/25/2024   Lactic acidosis 06/25/2024   CHF (congestive heart failure) (HCC) 06/24/2024   Nonischemic cardiomyopathy (HCC) 06/24/2024   Demand ischemia (HCC) 06/24/2024   Acute heart failure with reduced ejection fraction (HFrEF) (HCC) 06/24/2024   Acute gout 04/04/2024   Chest pain 04/02/2024   Chronic kidney disease, stage 3a (HCC) 04/02/2024   Hyperkalemia 04/02/2024   CAD (coronary artery disease) 04/02/2024   Acute on chronic systolic CHF (congestive heart failure) (HCC) 04/02/2024   Shortness of breath 11/25/2023   Essential hypertension 11/24/2023   Heart failure (HCC) 11/23/2023   ETOH abuse 11/23/2023   Elevated troponin 11/23/2023   Leukocytosis 11/23/2023   Acute pulmonary edema (HCC) 11/23/2023   PCP:  Celestia Rosaline SQUIBB, NP Pharmacy:   [place] [facility] [address] [place] [address] Phone: [phone] Fax: [phone]  [facility]  [address] [place] [address] Phone: [phone] Fax: [phone]  [facility]  Pharmacy [address] [place] [address] Phone: [phone] Fax: [phone]     Social Drivers of Health (SDOH) Social History: SDOH Screenings   Food Insecurity: Food Insecurity Present (06/24/2024)  Housing: High Risk (06/24/2024)  Transportation Needs: No Transportation Needs (06/24/2024)  Recent Concern: Transportation Needs - Unmet Transportation Needs (06/04/2024)  Utilities: At Risk (06/24/2024)  Depression (PHQ2-9): Medium Risk (06/04/2024)  Financial Resource Strain: High Risk (04/12/2024)  Stress: Stress Concern Present (06/04/2024)  Tobacco Use: High Risk (06/23/2024)  Health Literacy: Adequate Health Literacy (01/03/2024)   SDOH Interventions: Food Insecurity Interventions: [employer] Provided, Inpatient TOC Housing Interventions: [employer] Provided, Inpatient TOC Utilities Interventions: Community Resources Provided, Inpatient TOC   Readmission Risk Interventions    06/24/2024    3:25 PM  Readmission Risk Prevention Plan  Transportation Screening Complete  Home Care Screening Complete  Medication Review (RN CM) Complete

## 2024-06-27 NOTE — Progress Notes (Signed)
 " Progress Note   Patient: Jesse Key FMW:[id number] DOB: [birth year] DOA: 06/23/2024     3 DOS: the patient was seen and examined on 06/27/2024   Brief hospital course: Jesse Key was admitted to the hospital with the working diagnosis of heart failure exacerbation.   58 year old male with history of heart failure, CKD  nonobstructive CAD, tobacco use, prior EtOH, currently homeless living on the streets comes into the hospital with increased dyspnea. Reported 2 days of progressive dyspnea and lower extremity edema, intermittent chest pain. Because severity of symptoms he called EMS. He was found edematous and was transported to the Ed.  On his initial physical examination his blood pressure was 125/88, HR 110, R 34 and 02 saturation 99%  Lungs with no wheezing or rhonchi, decreased breath sounds at the right base, heart with S1 and S2 present and tachycardic with no gallops or rubs, abdomen soft and not distended, no lower extremity edema.   Patient was placed on IV furosemide  for diuresis.  Echocardiogram with reduced LV systolic function, < 20% EF.  87/76 placed on milrinone  for low output heart failure. 12/24 weaned off milrinone       Assessment and Plan: * Acute on chronic systolic CHF (congestive heart failure) (HCC) Echocardiogram with reduced LV systolic function less than 20%, global hypokinesis, moderate dilatation of LV cavity, RV systolic function preserved, LA with moderate dilatation, no significant valvular disease.   Urine output 1,675 ml Systolic blood pressure 117 mmHg Sv02 69.6   Continue medical therapy with digoxin  and SGLT 2  Inh. Limited medical therapy due to risk of hypotension and acutely reduced GFR.    CAD (coronary artery disease) No acute coronary syndrome.   Essential hypertension Dyslipidemia  Continue blood pressure monitoring   On rosuvastatin    Chronic kidney disease, stage 3a (HCC) AKI  Renal function with serum cr trending down to  1.38 with K at 4,4 and serum bicarbonate at 25 Na 138 and Mg 2.6   Plan to continue close monitoring renal function and electrolytes.  Holding on loop diuretic for now.   ETOH abuse No signs of withdrawal.   Asthma, chronic Improved air movement  Plan to hold on antibiotic therapy and steroids.  Continue bronchodilator therapy and oxymetry monitoring   Reactive leukocytosis, no signs of bacterial infection.  Continue to hold on antibiotic therapy Off steroids wbc is trending down     Subjective: Patient is feeling better, dyspnea and edema has improved, he has not PND or orthopnea, no chest pain   Physical Exam: Vitals:   06/27/24 0415 06/27/24 0417 06/27/24 0715 06/27/24 1120  BP: 120/79 (!) 127/92 121/80 117/76  Pulse:   95 96  Resp:   16 16  Temp:   (!) 97.3 F (36.3 C) 98.2 F (36.8 C)  TempSrc:   Oral Oral  SpO2:   96% 100%  Weight: 59.1 kg     Height:       Neurology awake and alert ENT with mild pallor with no icterus Cardiovascular with S1 and S2 present and regular with no gallops or rubs, no murmurs Respiratory with no rales or wheezing, no rhonchi  Abdomen with no distention, soft and non tender No lower extremity edema   Data Reviewed:    Family Communication: no family at the bedside   Disposition: Status is: Inpatient Remains inpatient appropriate because: recovering heart failure   Planned Discharge Destination: Home     Author: Elidia Toribio Furnace, MD 06/27/2024 2:05 PM  For on call review [email or web address].  "

## 2024-06-28 ENCOUNTER — Other Ambulatory Visit (HOSPITAL_COMMUNITY): Payer: Self-pay

## 2024-06-28 ENCOUNTER — Other Ambulatory Visit (HOSPITAL_COMMUNITY): Payer: Self-pay | Admitting: Emergency Medicine

## 2024-06-28 DIAGNOSIS — N1831 Chronic kidney disease, stage 3a: Secondary | ICD-10-CM | POA: Diagnosis not present

## 2024-06-28 DIAGNOSIS — I2510 Atherosclerotic heart disease of native coronary artery without angina pectoris: Secondary | ICD-10-CM | POA: Diagnosis not present

## 2024-06-28 DIAGNOSIS — I5023 Acute on chronic systolic (congestive) heart failure: Secondary | ICD-10-CM | POA: Diagnosis not present

## 2024-06-28 DIAGNOSIS — I10 Essential (primary) hypertension: Secondary | ICD-10-CM | POA: Diagnosis not present

## 2024-06-28 LAB — BASIC METABOLIC PANEL WITH GFR
Anion gap: 8 (ref 5–15)
BUN: 24 mg/dL — ABNORMAL HIGH (ref 6–20)
CO2: 25 mmol/L (ref 22–32)
Calcium: 8.6 mg/dL — ABNORMAL LOW (ref 8.9–10.3)
Chloride: 107 mmol/L (ref 98–111)
Creatinine, Ser: 1.36 mg/dL — ABNORMAL HIGH (ref 0.61–1.24)
GFR, Estimated: 60 mL/min — ABNORMAL LOW
Glucose, Bld: 88 mg/dL (ref 70–99)
Potassium: 4.6 mmol/L (ref 3.5–5.1)
Sodium: 139 mmol/L (ref 135–145)

## 2024-06-28 LAB — CBC
HCT: 47.5 % (ref 39.0–52.0)
Hemoglobin: 14.9 g/dL (ref 13.0–17.0)
MCH: 23.4 pg — ABNORMAL LOW (ref 26.0–34.0)
MCHC: 31.4 g/dL (ref 30.0–36.0)
MCV: 74.7 fL — ABNORMAL LOW (ref 80.0–100.0)
Platelets: 216 K/uL (ref 150–400)
RBC: 6.36 MIL/uL — ABNORMAL HIGH (ref 4.22–5.81)
RDW: 18.7 % — ABNORMAL HIGH (ref 11.5–15.5)
WBC: 10.2 K/uL (ref 4.0–10.5)
nRBC: 0 % (ref 0.0–0.2)

## 2024-06-28 LAB — COOXEMETRY PANEL
Carboxyhemoglobin: 1.8 % — ABNORMAL HIGH (ref 0.5–1.5)
Methemoglobin: <0.7 % (ref 0.0–1.5)
O2 Saturation: 60.6 %
Total hemoglobin: 15.1 g/dL (ref 12.0–16.0)

## 2024-06-28 LAB — MAGNESIUM: Magnesium: 2.3 mg/dL (ref 1.7–2.4)

## 2024-06-28 LAB — LACTIC ACID, PLASMA: Lactic Acid, Venous: 1.1 mmol/L (ref 0.5–1.9)

## 2024-06-28 MED ORDER — EMPAGLIFLOZIN 10 MG PO TABS
10.0000 mg | ORAL_TABLET | Freq: Every day | ORAL | 0 refills | Status: DC
  Filled 2024-06-28: qty 30, 30d supply, fill #0

## 2024-06-28 MED ORDER — DIGOXIN 62.5 MCG PO TABS
1.0000 | ORAL_TABLET | Freq: Every day | ORAL | 0 refills | Status: DC
  Filled 2024-06-28: qty 30, 30d supply, fill #0

## 2024-06-28 MED ORDER — PNEUMOCOCCAL 20-VAL CONJ VACC 0.5 ML IM SUSY: 0.5000 mL | PREFILLED_SYRINGE | INTRAMUSCULAR | Status: DC

## 2024-06-28 MED ORDER — PNEUMOCOCCAL 20-VAL CONJ VACC 0.5 ML IM SUSY
0.5000 mL | PREFILLED_SYRINGE | INTRAMUSCULAR | Status: AC | PRN
  Administered 2024-06-28: 0.5 mL via INTRAMUSCULAR
  Filled 2024-06-28: qty 0.5

## 2024-06-28 MED ORDER — FUROSEMIDE 40 MG PO TABS
40.0000 mg | ORAL_TABLET | Freq: Every day | ORAL | 0 refills | Status: DC
  Filled 2024-06-28: qty 30, 30d supply, fill #0

## 2024-06-28 MED ORDER — FUROSEMIDE 40 MG PO TABS
40.0000 mg | ORAL_TABLET | Freq: Every day | ORAL | Status: DC
  Administered 2024-06-28: 40 mg via ORAL
  Filled 2024-06-28: qty 1

## 2024-06-28 MED ORDER — ROSUVASTATIN CALCIUM 20 MG PO TABS
20.0000 mg | ORAL_TABLET | Freq: Every day | ORAL | 0 refills | Status: DC
  Filled 2024-06-28: qty 30, 30d supply, fill #0

## 2024-06-28 MED ORDER — SACUBITRIL-VALSARTAN 24-26 MG PO TABS
1.0000 | ORAL_TABLET | Freq: Two times a day (BID) | ORAL | 0 refills | Status: DC
  Filled 2024-06-28: qty 60, 30d supply, fill #0

## 2024-06-28 MED ORDER — ALBUTEROL SULFATE HFA 108 (90 BASE) MCG/ACT IN AERS
1.0000 | INHALATION_SPRAY | Freq: Four times a day (QID) | RESPIRATORY_TRACT | 0 refills | Status: AC | PRN
  Filled 2024-06-28: qty 6.7, 25d supply, fill #0

## 2024-06-28 NOTE — Progress Notes (Addendum)
 "    Advanced Heart Failure Rounding Note  Cardiologist: Soyla DELENA Merck, MD  AHF Cardiologist: Dr. Cherrie Chief Complaint: CHF Patient Profile   Jesse Key is a 58 y.o. male with history of tobacco and alcohol use, and chronic systolic heart failure.   Significant events:   12/23: added milrinone   12/24: off milrinone   Subjective:    Co-ox 61%. Off milrinone . sCr stable 1.3  Objective:    Weight Range: 59.1 kg Body mass index is 21.7 kg/m.   Vital Signs:   Temp:  [97.7 F (36.5 C)-98.3 F (36.8 C)] 97.7 F (36.5 C) (12/26 0730) Pulse Rate:  [70-96] 90 (12/26 0730) Resp:  [16-18] 18 (12/26 0730) BP: (101-121)/(71-77) 111/77 (12/26 0730) SpO2:  [97 %-100 %] 98 % (12/26 0730) Weight:  [59.1 kg] 59.1 kg (12/26 0357) Last BM Date : 06/27/24  Weight change: Filed Weights   06/26/24 0315 06/27/24 0415 06/28/24 0357  Weight: 61.5 kg 59.1 kg 59.1 kg   Intake/Output:  Intake/Output Summary (Last 24 hours) at 06/28/2024 0855 Last data filed at 06/27/2024 2317 Gross per 24 hour  Intake 460 ml  Output 1280 ml  Net -820 ml    Physical Exam   General: Well appearing. No distress  Cardiac: JVP ~ 7cm. No murmurs  Extremities: Warm and dry.  No edema.  Neuro: A&O x3. Affect pleasant.   Telemetry   SR 90s (personally reviewed)  Labs   CBC Recent Labs    06/26/24 0315 06/27/24 0505 06/28/24 0500  WBC 21.8* 17.2* 10.2  NEUTROABS 18.6*  --   --   HGB 14.5 14.6 14.9  HCT 44.2 46.3 47.5  MCV 73.4* 74.1* 74.7*  PLT 265 244 216   Basic Metabolic Panel Recent Labs    87/74/74 0505 06/28/24 0500  NA 138 139  K 4.4 4.6  CL 104 107  CO2 25 25  GLUCOSE 107* 88  BUN 30* 24*  CREATININE 1.38* 1.36*  CALCIUM  9.1 8.6*  MG 2.6* 2.3   Liver Function Tests Recent Labs    06/25/24 1329 06/26/24 0315  AST 43* 30  ALT 30 27  ALKPHOS 100 86  BILITOT 0.5 0.4  PROT 7.2 7.0  ALBUMIN 3.7 3.6   BNP (last 3 results) Recent Labs    04/01/24 1327  04/12/24 1014 05/21/24 1004  BNP >4,500.0* 3,217.5* 3,013.7*   ProBNP (last 3 results) Recent Labs    06/20/24 1519 06/23/24 2241  PROBNP 7,627.0* 11,603.0*   Hemoglobin A1C No results for input(s): HGBA1C in the last 72 hours.  Medications:    Scheduled Medications:  Chlorhexidine  Gluconate Cloth  6 each Topical Daily   digoxin   0.0625 mg Oral Daily   empagliflozin   10 mg Oral QAC breakfast   heparin   5,000 Units Subcutaneous Q8H   pantoprazole   40 mg Oral Daily   rosuvastatin   20 mg Oral Daily   sacubitril -valsartan   1 tablet Oral BID   sodium chloride  flush  10-40 mL Intracatheter Q12H    Infusions:    PRN Medications: acetaminophen  **OR** acetaminophen , albuterol , alum & mag hydroxide-simeth, ondansetron  **OR** ondansetron  (ZOFRAN ) IV, sodium chloride  flush  Assessment/Plan   1. 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 Echo in 5/25 showed EF < 20%, RV nl, global HK, mid-mod MR. RHC/LHC in 5/25 showed non-obstructive CAD, well-compensated filling pressures with CI 2.07.  Cardiac MRI in 5/25 showed LV EF 18%, RV EF 35%, nonspecific RV insertion site LGE.  Echo 9/25:  EF 15-20%, RV mildly reduced, RVSP 55 mmHg, severe MR, severe TR, dilated IVC.  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  - Echo this admission with EF <20%, LV with GHK, RV normal, LA mod dilated, mild MR.  - NYHA IIIa on exam, IV on admission. - Off milrinone . Co-ox and CVP 8. - restart lasix  40 mg daily - continue entresto  24/26 mg bid - Continue dig and Jardiance  - hold off on spiro with hyperkalemia this admission - add back BB in outpatient  2. LBBB - QRS 162 ms on last ECG - Possible CRT-D candidate   3. AKI on CKD stage 3b: Baseline ~1.5-1.6 - Mild AKI from admission SCR. SCr 1.48 on admission, up to 1.78 post diuresis. - Improved back to baseline   4. 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   - No s/s angina - Continue ASA,  statin.   5. Leukocytosis - WBC 12.8>22.3>21.8> 17.2. afebrile - CXR without obvious signs of PNA, continues with cough despite improved fluid - on azithro - COVID/flu panel negative   6. Cocaine use - UDS + this admission  7. SDOH:  - poor health literacy - Enrolled in paramedicine, compliance much improved - May eventually need to consider disability; currently working at [employer] - No longer has housing; has been staying in a motel.   Length of Stay: 4  [name], NP  06/28/2024, 8:55 AM  Advanced Heart Failure Team Pager [phone] (M-F; 7a - 5p)   Please visit [email or web address]: For overnight coverage please call cardiology fellow first. If fellow not available call Shock/ECMO MD on call.  For ECMO / Mechanical Support (Impella, IABP, LVAD) issues call Shock / ECMO MD on call.   Patient seen and examined with the above-signed Advanced Practice Provider and/or Housestaff. I personally reviewed laboratory data, imaging studies and relevant notes. I independently examined the patient and formulated the important aspects of the plan. I have edited the note to reflect any of my changes or salient points. I have personally discussed the plan with the patient and/or family.  Doing well  General:  Sitting up in bed. No resp difficulty HEENT: normal Neck: supple. JVP 8 .  Cor: Regular rate & rhythm. No rubs, gallops or murmurs. Lungs: clear Abdomen: soft, nontender, nondistended.Good bowel sounds. Extremities: no cyanosis, clubbing, rash, edema Neuro: alert & orientedx3, cranial nerves grossly intact. moves all 4 extremities w/o difficulty. Affect pleasant  Stable from HF perspective. Ok for d/c today from our standpoint with outpatient HF followup.  AHF will sign off  Toribio Fuel, MD  12:28 PM   "

## 2024-06-28 NOTE — Progress Notes (Signed)
 Reviewed AVS, patient expressed understanding of medications, MD follow up reviewed.   Removed IV, Site clean, dry and intact.  Patient states all belongings brought to the hospital at time of admission are accounted for and packed to take home.  Picked up medications from [facility] pharmacy. Nursing Staff contacted to transport patient to Discharge lounge to wait for transportation home.

## 2024-06-28 NOTE — TOC Transition Note (Signed)
 Transition of Care [facility]) - Discharge Note   Patient Details  Name: Jesse Key MRN: [id number] Date of Birth: [birth year]  Transition [facility]) CM/SW Contact:  Waddell Barnie Rama, RN Phone Number: 06/28/2024, 1:50 PM   Clinical Narrative:    For dc today, patient states he has resources for shelters and he spoke with the [facility] and there are supposed to call him back.  He does not have any funds for meds, the [facility] pharmacy will bill him for his meds.  He will need a cab voucher in [place] [employer] [place].     Barriers to Discharge: Continued Medical Work up   Patient Goals and CMS Choice            Discharge Placement                       Discharge Plan and Services Additional resources added to the After Visit Summary for     Discharge Planning Services: CM Consult                                 Social Drivers of Health (SDOH) Interventions SDOH Screenings   Food Insecurity: Food Insecurity Present (06/24/2024)  Housing: High Risk (06/24/2024)  Transportation Needs: No Transportation Needs (06/24/2024)  Recent Concern: Transportation Needs - Unmet Transportation Needs (06/04/2024)  Utilities: At Risk (06/24/2024)  Depression (PHQ2-9): Medium Risk (06/04/2024)  Financial Resource Strain: High Risk (04/12/2024)  Stress: Stress Concern Present (06/04/2024)  Tobacco Use: High Risk (06/23/2024)  Health Literacy: Adequate Health Literacy (01/03/2024)     Readmission Risk Interventions    06/24/2024    3:25 PM  Readmission Risk Prevention Plan  Transportation Screening Complete  Home Care Screening Complete  Medication Review (RN CM) Complete

## 2024-06-28 NOTE — Plan of Care (Signed)
   Problem: Education: Goal: Knowledge of General Education information will improve Description: Including pain rating scale, medication(s)/side effects and non-pharmacologic comfort measures Outcome: Progressing   Problem: Clinical Measurements: Goal: Cardiovascular complication will be avoided Outcome: Progressing

## 2024-06-28 NOTE — Progress Notes (Signed)
 Mobility Specialist: Progress Note   06/28/24 1303  Therapy Vitals  Resp 18  BP 105/66  Patient Position (if appropriate) Lying  Oxygen Therapy  SpO2 98 %  O2 Device Room Air  Mobility  Activity Ambulated with assistance  Level of Assistance Modified independent, requires aide device or extra time  Assistive Device Other (Comment) (IV pole)  Distance Ambulated (ft) 300 ft  Activity Response Tolerated well  Mobility Referral Yes  Mobility visit 1 Mobility  Mobility Specialist Start Time (ACUTE ONLY) 0912  Mobility Specialist Stop Time (ACUTE ONLY) [id number]  Mobility Specialist Time Calculation (min) (ACUTE ONLY) 12 min    Pt received in bed, agreeable to mobility session. ModI throughout w/ IV pole, no complaints. VSS on RA. Took 1x brief standing break d/t fatigue. Returned to room. Left on EOB with all needs met, call bell in reach.   Ileana Lute Mobility Specialist Please contact via SecureChat or Rehab office at [phone]

## 2024-06-28 NOTE — Discharge Summary (Addendum)
 " Physician Discharge Summary   Patient: Jesse Key MRN: [id number] DOB: 1966-06-29  Admit date:     06/23/2024  Discharge date: 06/28/2024  Discharge Physician: Elidia Sieving Mikya Don   PCP: Celestia Rosaline SQUIBB, NP   Recommendations at discharge:    Dose of digoxin  decreased, holding B blocker due to risk of hypotension and spironolactone  due to hyperkalemia.  Continue guideline directed medical therapy with entresto , digoxin  and SGLT 2 inh.  Diuresis with furosemide  40 mg po daily Follow up renal function and electrolytes as outpatient in 7 days Follow up with Rosaline Celestia P NP in 7 to 10 days Follow up with Cardiology as scheduled.   Discharge Diagnoses: Principal Problem:   Acute on chronic systolic CHF (congestive heart failure) (HCC) Active Problems:   CAD (coronary artery disease)   Essential hypertension   Chronic kidney disease, stage 3a (HCC)   ETOH abuse   Asthma, chronic  Resolved Problems:   * No resolved hospital problems. [facility] Course: Mr. Petteway was admitted to the hospital with the working diagnosis of heart failure exacerbation.   58 year old male with history of heart failure, CKD  nonobstructive CAD, tobacco use, prior alcohol abuse, currently homeless living on the streets comes into the hospital with increased dyspnea. Reported 2 days of progressive dyspnea and lower extremity edema, intermittent chest pain. Because severity of symptoms he called EMS. He was found edematous and was transported to the Ed.  On his initial physical examination his blood pressure was 125/88, HR 110, R 34 and 02 saturation 99%  Lungs with no wheezing or rhonchi, decreased breath sounds at the right base, heart with S1 and S2 present and tachycardic with no gallops or rubs, abdomen soft and not distended, no lower extremity edema.   Na 139, K 5.1 Cl 106 bicarbonate 22 glucose 89 bun 17 cr 1,41 BNP 11,603 High sensitive troponin 125 , 214 Wbc 13.2 hgb 11.5 plt 251    Chest radiograph with cardiomegaly, bilateral hilar vascular congestion with cephalization of the vasculature, no infiltrates or effusions.   CT chest with bilateral faint ground glass opacities, with bibasilar atelectasis, no effusions. No evidence of pulmonary embolism. A few tiny subpleural nodules noted in the left upper lobe, no routine follow up needed.   EKG 109 bpm, normal axis, left bundle branch block, qtc 476, sinus rhythm with bilateral atrial enlargement, ST depression lead II, III, aVF, with no significant T wave changes, positive LVH.   Patient was placed on IV furosemide  for diuresis.  Echocardiogram with reduced LV systolic function, < 20% EF.  87/76 placed on milrinone  for low output heart failure. 12/24 weaned off milrinone   12/26 clinically euvolemic, plan to continue guideline directed medical therapy and follow up as outpatient.  Advised to be adherent to medical therapy   Assessment and Plan: * Acute on chronic systolic CHF (congestive heart failure) (HCC) Echocardiogram with reduced LV systolic function less than 20%, global hypokinesis, moderate dilatation of LV cavity, RV systolic function preserved, LA with moderate dilatation, no significant valvular disease.   Patient was placed on IV furosemide  for diuresis and milrinone  for inotropic support. Low output heart failure.  Negative fluid balance was achieved, -4,284 ml, with significant improvement in his symptoms.   Continue medical therapy with entresto ,digoxin  and SGLT 2  Inh. Loop diuretic therapy with furosemide  40 mg po daily.  Holding spironolactone  due to hyperkalemia on admission.  Limited medical therapy due to risk of hypotension and acutely reduced GFR.  12/22 digoxin  level 0,8   CAD (coronary artery disease) No acute coronary syndrome.  High sensitive troponin elevation due to heart failure exacerbation   Essential hypertension Continue entresto  for blood pressure control   Dyslipidemia   On rosuvastatin    Chronic kidney disease, stage 3a (HCC) AKI, hyperkalemia   At the time of discharge his renal function is stable with serum cr at 1,36 with K at 4,6 and serum bicarbonate at 25  Na 139 and Mg 2,3   Continue furosemide  and SGLT 2 inh Follow up renal function and electrolytes as outpatient   ETOH abuse No signs of withdrawal.   Asthma, chronic Improved air movement  Patient had a short course of steroids with good toleration,   Reactive leukocytosis, no signs of bacterial infection.  No antibiotic therapy indicated.    Consultants: cardiology  Procedures performed: none   Disposition: Home Diet recommendation:  Cardiac diet DISCHARGE MEDICATION: Allergies as of 06/28/2024       Reactions   Shrimp [shellfish Allergy] Anaphylaxis, Itching   ALL SHELLFISH   Shellfish Allergy Itching        Medication List     STOP taking these medications    Aspirin  Low Dose 81 MG tablet Generic drug: aspirin  EC   clotrimazole -betamethasone  cream Commonly known as: LOTRISONE    hydrOXYzine  10 MG tablet Commonly known as: ATARAX    metoprolol  succinate 25 MG 24 hr tablet Commonly known as: TOPROL -XL   predniSONE  20 MG tablet Commonly known as: DELTASONE    spironolactone  25 MG tablet Commonly known as: ALDACTONE        TAKE these medications    albuterol  108 (90 Base) MCG/ACT inhaler Commonly known as: VENTOLIN  HFA Inhale 1-2 puffs into the lungs every 6 (six) hours as needed for wheezing or shortness of breath.   Digoxin  62.5 MCG Tabs Commonly known as: Lanoxin  Take 0.0625 mg by mouth daily. Start taking on: June 29, 2024 What changed:  medication strength how much to take   empagliflozin  10 MG Tabs tablet Commonly known as: Jardiance  Take 1 tablet (10 mg total) by mouth daily before breakfast.   furosemide  40 MG tablet Commonly known as: LASIX  Take 1 tablet (40 mg total) by mouth daily.   rosuvastatin  20 MG tablet Commonly known  as: CRESTOR  Take 1 tablet (20 mg total) by mouth daily.   sacubitril -valsartan  24-26 MG Commonly known as: ENTRESTO  Take 1 tablet by mouth 2 (two) times daily.        Follow-up Information     [facility] Follow up on 07/10/2024.   Specialty: Cardiology Why: at 11:30a [facility], Entrance C Free Valet Parking Available Contact information: [address] [place]  [address] [phone]               Discharge Exam: Fredricka Weights   06/26/24 0315 06/27/24 0415 06/28/24 0357  Weight: 61.5 kg 59.1 kg 59.1 kg   BP 111/77 (BP Location: Left Arm)   Pulse 90   Temp 97.7 F (36.5 C) (Oral)   Resp 18   Ht 5' 5 (1.651 m)   Wt 59.1 kg   SpO2 98%   BMI 21.70 kg/m   Patient with no chest pain, no dyspnea, no PND, orthopnea or lower extremity edema  Neurology awake and alert ENT with no pallor or icterus Cardiovascular with S1 and S2 present and regular with no gallops, rubs or murmurs Respiratory with no rales or wheezing, no rhonchi Abdomen with  no distention, soft and non tender No  lower extremity edema   Condition at discharge: stable  The results of significant diagnostics from this hospitalization (including imaging, microbiology, ancillary and laboratory) are listed below for reference.   Imaging Studies: US  EKG SITE RITE Result Date: 06/25/2024 If Site Rite image not attached, placement could not be confirmed due to current cardiac rhythm.  ECHOCARDIOGRAM COMPLETE Result Date: 06/24/2024    ECHOCARDIOGRAM REPORT   Patient Name:   MARRELL DICAPRIO Hendry Date of Exam: 06/24/2024 Medical Rec #:  [id number]        Height:       65.0 in Accession #:    [id number]       Weight:       138.7 lb Date of Birth:  1966/02/01         BSA:          1.693 m Patient Age:    58 years         BP:           97/66 mmHg Patient Gender: M                HR:           89 bpm. Exam Location:  Inpatient Procedure: 2D  Echo, Color Doppler, Cardiac Doppler and Intracardiac            Opacification Agent (Both Spectral and Color Flow Doppler were            utilized during procedure). Indications:    Chest pain R07.9  History:        Patient has prior history of Echocardiogram examinations, most                 recent 04/02/2024. Signs/Symptoms:Chest Pain.  Sonographer:    Sydnee Wilson RDCS Referring Phys: [id number] SARA-MAIZ A THOMAS IMPRESSIONS  1. Left ventricular ejection fraction, by estimation, is <20%. The left ventricle has severely decreased function. The left ventricle demonstrates global hypokinesis. The left ventricular internal cavity size was moderately dilated. Left ventricular diastolic parameters are indeterminate.  2. Right ventricular systolic function is normal. The right ventricular size is normal.  3. Left atrial size was moderately dilated.  4. The mitral valve is normal in structure. Mild mitral valve regurgitation. No evidence of mitral stenosis.  5. The aortic valve is tricuspid. There is mild calcification of the aortic valve. Aortic valve regurgitation is trivial. Aortic valve sclerosis/calcification is present, without any evidence of aortic stenosis.  6. The inferior vena cava is normal in size with greater than 50% respiratory variability, suggesting right atrial pressure of 3 mmHg. FINDINGS  Left Ventricle: Left ventricular ejection fraction, by estimation, is <20%. The left ventricle has severely decreased function. The left ventricle demonstrates global hypokinesis. The left ventricular internal cavity size was moderately dilated. There is no left ventricular hypertrophy. Left ventricular diastolic parameters are indeterminate. Right Ventricle: The right ventricular size is normal. No increase in right ventricular wall thickness. Right ventricular systolic function is normal. Left Atrium: Left atrial size was moderately dilated. Right Atrium: Right atrial size was normal in size. Pericardium: There is  no evidence of pericardial effusion. Mitral Valve: The mitral valve is normal in structure. Mild mitral valve regurgitation. No evidence of mitral valve stenosis. Tricuspid Valve: The tricuspid valve is normal in structure. Tricuspid valve regurgitation is trivial. No evidence of tricuspid stenosis. Aortic Valve: The aortic valve is tricuspid. There is mild calcification of the aortic valve. Aortic  valve regurgitation is trivial. Aortic valve sclerosis/calcification is present, without any evidence of aortic stenosis. Aortic valve mean gradient measures 1.0 mmHg. Aortic valve peak gradient measures 3.2 mmHg. Pulmonic Valve: The pulmonic valve was normal in structure. Pulmonic valve regurgitation is not visualized. No evidence of pulmonic stenosis. Aorta: The aortic root is normal in size and structure. Venous: The inferior vena cava is normal in size with greater than 50% respiratory variability, suggesting right atrial pressure of 3 mmHg. IAS/Shunts: There is right bowing of the interatrial septum, suggestive of elevated left atrial pressure. No atrial level shunt detected by color flow Doppler.  LEFT VENTRICLE PLAX 2D LVIDd:         6.00 cm      Diastology LVIDs:         5.30 cm      LV e' medial:    10.60 cm/s LV PW:         1.40 cm      LV E/e' medial:  12.9 LV IVS:        1.00 cm      LV e' lateral:   13.40 cm/s LVOT diam:     2.00 cm      LV E/e' lateral: 10.2 LVOT Area:     3.14 cm  LV Volumes (MOD) LV vol d, MOD A2C: 182.0 ml LV vol d, MOD A4C: 199.0 ml LV vol s, MOD A2C: 156.0 ml LV vol s, MOD A4C: 148.0 ml LV SV MOD A2C:     26.0 ml LV SV MOD A4C:     199.0 ml LV SV MOD BP:      38.6 ml RIGHT VENTRICLE RV S prime:     12.30 cm/s TAPSE (M-mode): 1.9 cm LEFT ATRIUM             Index        RIGHT ATRIUM           Index LA diam:        3.40 cm 2.01 cm/m   RA Area:     12.30 cm LA Vol (A2C):   50.3 ml 29.71 ml/m  RA Volume:   27.50 ml  16.24 ml/m LA Vol (A4C):   42.1 ml 24.87 ml/m LA Biplane Vol: 48.9 ml  28.88 ml/m  AORTIC VALVE AV Vmax:      90.10 cm/s AV Vmean:     50.200 cm/s AV VTI:       0.150 m AV Peak Grad: 3.2 mmHg AV Mean Grad: 1.0 mmHg  AORTA Ao Root diam: 3.10 cm Ao Asc diam:  2.60 cm MITRAL VALVE                TRICUSPID VALVE MV Area (PHT): 6.48 cm     TR Peak grad:   15.2 mmHg MV Decel Time: 117 msec     TR Vmax:        195.00 cm/s MV E velocity: 137.00 cm/s MV A velocity: 28.30 cm/s   SHUNTS MV E/A ratio:  4.84         Systemic Diam: 2.00 cm Toribio Fuel MD Electronically signed by Toribio Fuel MD Signature Date/Time: 06/24/2024/6:49:52 PM    Final    CT Angio Chest PE W and/or Wo Contrast Result Date: 06/23/2024 CLINICAL DATA:  Shortness of breath EXAM: CT ANGIOGRAPHY CHEST WITH CONTRAST TECHNIQUE: Multidetector CT imaging of the chest was performed using the standard protocol during bolus administration of intravenous contrast. Multiplanar CT image reconstructions and MIPs were  obtained to evaluate the vascular anatomy. RADIATION DOSE REDUCTION: This exam was performed according to the departmental dose-optimization program which includes automated exposure control, adjustment of the mA and/or kV according to patient size and/or use of iterative reconstruction technique. CONTRAST:  75mL OMNIPAQUE  IOHEXOL  350 MG/ML SOLN COMPARISON:  Chest x-ray from earlier in the same day. FINDINGS: Cardiovascular: Thoracic aorta demonstrates mild atherosclerotic calcifications without aneurysmal dilatation. Coronary calcifications are noted. The heart is enlarged in size. Pulmonary artery shows a normal branching pattern bilaterally. No intraluminal filling defect to suggest pulmonary embolism is noted. Mediastinum/Nodes: Thoracic inlet is within normal limits. No hilar or mediastinal adenopathy is noted. The esophagus as visualized is within normal limits. Lungs/Pleura: Lungs are well aerated bilaterally. No focal infiltrate or sizable effusion is seen. A few tiny subpleural nodules are noted within  the left upper lobe. These are stable in appearance from the prior exam. Upper Abdomen: Visualized upper abdomen is within normal limits. Musculoskeletal: Bony structures are within normal limits. Review of the MIP images confirms the above findings. IMPRESSION: No evidence of pulmonary emboli. A few tiny subpleural nodules are noted in the left upper lobe.No routine follow-up imaging is recommended per Fleischner Society Guidelines. Aortic Atherosclerosis (ICD10-I70.0). Electronically Signed   By: Oneil Devonshire M.D.   On: 06/23/2024 23:56   DG Chest Portable 1 View Result Date: 06/23/2024 EXAM: 1 VIEW XRAY OF THE CHEST 06/23/2024 10:45:00 PM COMPARISON: 04/01/2024 CLINICAL HISTORY: sob, cp, chf FINDINGS: LUNGS AND PLEURA: Pulmonary vascular congestion. Kerley B lines in the lower lungs compatible with interstitial edema. No focal pulmonary opacity. No pleural effusion. No pneumothorax. HEART AND MEDIASTINUM: Unchanged cardiomegaly. No acute abnormality of the mediastinal silhouette. BONES AND SOFT TISSUES: No acute osseous abnormality. IMPRESSION: 1. CHF with mild interstitial edema. Electronically signed by: Norman Gatlin MD 06/23/2024 10:57 PM EST RP Workstation: [id number]    Microbiology: Results for orders placed or performed during the hospital encounter of 06/23/24  Respiratory (~20 pathogens) panel by PCR     Status: None   Collection Time: 06/24/24  7:55 AM   Specimen: Nasopharyngeal Swab; Respiratory  Result Value Ref Range Status   Adenovirus NOT DETECTED NOT DETECTED Final   Coronavirus 229E NOT DETECTED NOT DETECTED Final    Comment: (NOTE) The Coronavirus on the Respiratory Panel, DOES NOT test for the novel  Coronavirus (2019 nCoV)    Coronavirus HKU1 NOT DETECTED NOT DETECTED Final   Coronavirus NL63 NOT DETECTED NOT DETECTED Final   Coronavirus OC43 NOT DETECTED NOT DETECTED Final   Metapneumovirus NOT DETECTED NOT DETECTED Final   Rhinovirus / Enterovirus NOT DETECTED NOT  DETECTED Final   Influenza A NOT DETECTED NOT DETECTED Final   Influenza B NOT DETECTED NOT DETECTED Final   Parainfluenza Virus 1 NOT DETECTED NOT DETECTED Final   Parainfluenza Virus 2 NOT DETECTED NOT DETECTED Final   Parainfluenza Virus 3 NOT DETECTED NOT DETECTED Final   Parainfluenza Virus 4 NOT DETECTED NOT DETECTED Final   Respiratory Syncytial Virus NOT DETECTED NOT DETECTED Final   Bordetella pertussis NOT DETECTED NOT DETECTED Final   Bordetella Parapertussis NOT DETECTED NOT DETECTED Final   Chlamydophila pneumoniae NOT DETECTED NOT DETECTED Final   Mycoplasma pneumoniae NOT DETECTED NOT DETECTED Final    Comment: Performed at [facility], [address] [place], [place] [address]  Resp panel by RT-PCR (RSV, Flu A&B, Covid)     Status: None   Collection Time: 06/26/24  4:56 PM  Result Value Ref  Range Status   SARS Coronavirus 2 by RT PCR NEGATIVE NEGATIVE Final   Influenza A by PCR NEGATIVE NEGATIVE Final   Influenza B by PCR NEGATIVE NEGATIVE Final    Comment: (NOTE) The Xpert Xpress SARS-CoV-2/FLU/RSV plus assay is intended as an aid in the diagnosis of influenza from Nasopharyngeal swab specimens and should not be used as a sole basis for treatment. Nasal washings and aspirates are unacceptable for Xpert Xpress SARS-CoV-2/FLU/RSV testing.  Fact Sheet for Patients: bloggercourse.com  Fact Sheet for Healthcare Providers: seriousbroker.it  This test is not yet approved or cleared by the [place]  FDA and has been authorized for detection and/or diagnosis of SARS-CoV-2 by FDA under an Emergency Use Authorization (EUA). This EUA will remain in effect (meaning this test can be used) for the duration of the COVID-19 declaration under Section 564(b)(1) of the Act, 21 U.S.C. section 360bbb-3(b)(1), unless the authorization is terminated or revoked.     Resp Syncytial Virus by PCR NEGATIVE NEGATIVE Final     Comment: (NOTE) Fact Sheet for Patients: bloggercourse.com  Fact Sheet for Healthcare Providers: seriousbroker.it  This test is not yet approved or cleared by the [place]  FDA and has been authorized for detection and/or diagnosis of SARS-CoV-2 by FDA under an Emergency Use Authorization (EUA). This EUA will remain in effect (meaning this test can be used) for the duration of the COVID-19 declaration under Section 564(b)(1) of the Act, 21 U.S.C. section 360bbb-3(b)(1), unless the authorization is terminated or revoked.  Performed at [facility], [address], [place], [place] [address]     Labs: CBC: Recent Labs  Lab 06/24/24 0622 06/25/24 0333 06/26/24 0315 06/27/24 0505 06/28/24 0500  WBC 12.8* 22.3* 21.8* 17.2* 10.2  NEUTROABS  --   --  18.6*  --   --   HGB 13.8 14.2 14.5 14.6 14.9  HCT 43.8 44.2 44.2 46.3 47.5  MCV 74.4* 74.2* 73.4* 74.1* 74.7*  PLT 236 262 265 244 216   Basic Metabolic Panel: Recent Labs  Lab 06/23/24 2241 06/24/24 0026 06/24/24 0622 06/25/24 0333 06/26/24 0315 06/27/24 0505 06/28/24 0500  NA 139 139  --  140 139 138 139  K 5.1 4.4  --  4.7 4.8 4.4 4.6  CL 106  --   --  101 103 104 107  CO2 22  --   --  22 26 25 25   GLUCOSE 89  --   --  120* 118* 107* 88  BUN 17  --   --  29* 42* 30* 24*  CREATININE 1.41*  --  1.36* 1.78* 1.61* 1.38* 1.36*  CALCIUM  8.3*  --   --  9.4 9.4 9.1 8.6*  MG  --   --  2.2  --  2.4 2.6* 2.3  PHOS  --   --  1.9*  --   --   --   --    Liver Function Tests: Recent Labs  Lab 06/25/24 0333 06/25/24 1329 06/26/24 0315  AST 51* 43* 30  ALT 32 30 27  ALKPHOS 95 100 86  BILITOT 0.5 0.5 0.4  PROT 6.9 7.2 7.0  ALBUMIN 3.7 3.7 3.6   CBG: No results for input(s): GLUCAP in the last 168 hours.  Discharge time spent: greater than 30 minutes.  Signed: Elidia Toribio Furnace, MD [facility] 06/28/2024 "

## 2024-06-30 ENCOUNTER — Other Ambulatory Visit: Payer: Self-pay

## 2024-06-30 ENCOUNTER — Emergency Department (HOSPITAL_COMMUNITY)

## 2024-06-30 ENCOUNTER — Observation Stay (HOSPITAL_COMMUNITY)
Admission: EM | Admit: 2024-06-30 | Discharge: 2024-07-02 | Disposition: A | Discharge status: Home / Self Care | Attending: Internal Medicine | Admitting: Internal Medicine

## 2024-06-30 DIAGNOSIS — I2510 Atherosclerotic heart disease of native coronary artery without angina pectoris: Secondary | ICD-10-CM | POA: Diagnosis not present

## 2024-06-30 DIAGNOSIS — K640 First degree hemorrhoids: Secondary | ICD-10-CM | POA: Diagnosis not present

## 2024-06-30 DIAGNOSIS — R079 Chest pain, unspecified: Secondary | ICD-10-CM

## 2024-06-30 DIAGNOSIS — K625 Hemorrhage of anus and rectum: Secondary | ICD-10-CM | POA: Diagnosis present

## 2024-06-30 DIAGNOSIS — F17210 Nicotine dependence, cigarettes, uncomplicated: Secondary | ICD-10-CM | POA: Insufficient documentation

## 2024-06-30 DIAGNOSIS — I130 Hypertensive heart and chronic kidney disease with heart failure and stage 1 through stage 4 chronic kidney disease, or unspecified chronic kidney disease: Secondary | ICD-10-CM | POA: Diagnosis not present

## 2024-06-30 DIAGNOSIS — N1831 Chronic kidney disease, stage 3a: Secondary | ICD-10-CM | POA: Diagnosis not present

## 2024-06-30 DIAGNOSIS — R0602 Shortness of breath: Secondary | ICD-10-CM

## 2024-06-30 DIAGNOSIS — F1010 Alcohol abuse, uncomplicated: Secondary | ICD-10-CM | POA: Insufficient documentation

## 2024-06-30 DIAGNOSIS — Z8659 Personal history of other mental and behavioral disorders: Secondary | ICD-10-CM | POA: Insufficient documentation

## 2024-06-30 DIAGNOSIS — J4489 Other specified chronic obstructive pulmonary disease: Secondary | ICD-10-CM | POA: Diagnosis not present

## 2024-06-30 DIAGNOSIS — D125 Benign neoplasm of sigmoid colon: Secondary | ICD-10-CM | POA: Diagnosis not present

## 2024-06-30 DIAGNOSIS — Z79899 Other long term (current) drug therapy: Secondary | ICD-10-CM | POA: Diagnosis not present

## 2024-06-30 DIAGNOSIS — I428 Other cardiomyopathies: Secondary | ICD-10-CM | POA: Diagnosis not present

## 2024-06-30 DIAGNOSIS — K648 Other hemorrhoids: Secondary | ICD-10-CM

## 2024-06-30 DIAGNOSIS — I5022 Chronic systolic (congestive) heart failure: Secondary | ICD-10-CM | POA: Diagnosis not present

## 2024-06-30 DIAGNOSIS — Z5901 Sheltered homelessness: Secondary | ICD-10-CM | POA: Diagnosis not present

## 2024-06-30 LAB — COMPREHENSIVE METABOLIC PANEL WITH GFR
ALT: 22 U/L (ref 0–44)
AST: 26 U/L (ref 15–41)
Albumin: 4.1 g/dL (ref 3.5–5.0)
Alkaline Phosphatase: 84 U/L (ref 38–126)
Anion gap: 10 (ref 5–15)
BUN: 23 mg/dL — ABNORMAL HIGH (ref 6–20)
CO2: 25 mmol/L (ref 22–32)
Calcium: 9.4 mg/dL (ref 8.9–10.3)
Chloride: 102 mmol/L (ref 98–111)
Creatinine, Ser: 1.25 mg/dL — ABNORMAL HIGH (ref 0.61–1.24)
GFR, Estimated: >60 mL/min
Glucose, Bld: 145 mg/dL — ABNORMAL HIGH (ref 70–99)
Potassium: 5.2 mmol/L — ABNORMAL HIGH (ref 3.5–5.1)
Sodium: 137 mmol/L (ref 135–145)
Total Bilirubin: 0.3 mg/dL (ref 0.0–1.2)
Total Protein: 7.3 g/dL (ref 6.5–8.1)

## 2024-06-30 LAB — CBC
HCT: 46.8 % (ref 39.0–52.0)
Hemoglobin: 14.8 g/dL (ref 13.0–17.0)
MCH: 24.1 pg — ABNORMAL LOW (ref 26.0–34.0)
MCHC: 31.6 g/dL (ref 30.0–36.0)
MCV: 76.1 fL — ABNORMAL LOW (ref 80.0–100.0)
Platelets: 210 K/uL (ref 150–400)
RBC: 6.15 MIL/uL — ABNORMAL HIGH (ref 4.22–5.81)
RDW: 18.7 % — ABNORMAL HIGH (ref 11.5–15.5)
WBC: 11.5 K/uL — ABNORMAL HIGH (ref 4.0–10.5)
nRBC: 0 % (ref 0.0–0.2)

## 2024-06-30 LAB — BASIC METABOLIC PANEL WITH GFR
Anion gap: 9 (ref 5–15)
BUN: 18 mg/dL (ref 6–20)
CO2: 23 mmol/L (ref 22–32)
Calcium: 8.7 mg/dL — ABNORMAL LOW (ref 8.9–10.3)
Chloride: 107 mmol/L (ref 98–111)
Creatinine, Ser: 1.28 mg/dL — ABNORMAL HIGH (ref 0.61–1.24)
GFR, Estimated: >60 mL/min
Glucose, Bld: 134 mg/dL — ABNORMAL HIGH (ref 70–99)
Potassium: 4.6 mmol/L (ref 3.5–5.1)
Sodium: 139 mmol/L (ref 135–145)

## 2024-06-30 LAB — TYPE AND SCREEN
ABO/RH(D): B POS
Antibody Screen: NEGATIVE

## 2024-06-30 LAB — POC OCCULT BLOOD, ED: Fecal Occult Bld: POSITIVE — AB

## 2024-06-30 LAB — TROPONIN T, HIGH SENSITIVITY
Troponin T High Sensitivity: 107 ng/L (ref 0–19)
Troponin T High Sensitivity: 87 ng/L — ABNORMAL HIGH (ref 0–19)

## 2024-06-30 LAB — PROTIME-INR
INR: 0.9 (ref 0.8–1.2)
Prothrombin Time: 12.4 s (ref 11.4–15.2)

## 2024-06-30 LAB — PRO BRAIN NATRIURETIC PEPTIDE: Pro Brain Natriuretic Peptide: 3740 pg/mL — ABNORMAL HIGH

## 2024-06-30 MED ORDER — PANTOPRAZOLE SODIUM 40 MG IV SOLR
40.0000 mg | Freq: Two times a day (BID) | INTRAVENOUS | Status: DC
  Administered 2024-06-30 – 2024-07-02 (×4): 40 mg via INTRAVENOUS
  Filled 2024-06-30: qty 10

## 2024-06-30 MED ORDER — FUROSEMIDE 20 MG PO TABS
20.0000 mg | ORAL_TABLET | Freq: Two times a day (BID) | ORAL | Status: DC
  Administered 2024-06-30 – 2024-07-01 (×3): 20 mg via ORAL
  Filled 2024-06-30: qty 1

## 2024-06-30 MED ORDER — ROSUVASTATIN CALCIUM 20 MG PO TABS
20.0000 mg | ORAL_TABLET | Freq: Every day | ORAL | Status: DC
  Administered 2024-07-01 – 2024-07-02 (×2): 20 mg via ORAL

## 2024-06-30 MED ORDER — ALBUTEROL SULFATE (2.5 MG/3ML) 0.083% IN NEBU: 2.5000 mg | INHALATION_SOLUTION | Freq: Four times a day (QID) | RESPIRATORY_TRACT | Status: DC | PRN

## 2024-06-30 MED ORDER — FOLIC ACID 1 MG PO TABS
1.0000 mg | ORAL_TABLET | Freq: Every day | ORAL | Status: DC
  Administered 2024-06-30 – 2024-07-02 (×3): 1 mg via ORAL
  Filled 2024-06-30: qty 1

## 2024-06-30 MED ORDER — ACETAMINOPHEN 650 MG RE SUPP: 650.0000 mg | Freq: Four times a day (QID) | RECTAL | Status: DC | PRN

## 2024-06-30 MED ORDER — ONDANSETRON HCL 4 MG/2ML IJ SOLN: 4.0000 mg | Freq: Four times a day (QID) | INTRAMUSCULAR | Status: DC | PRN

## 2024-06-30 MED ORDER — NICOTINE 14 MG/24HR TD PT24
14.0000 mg | MEDICATED_PATCH | Freq: Every day | TRANSDERMAL | Status: DC
  Administered 2024-06-30 – 2024-07-02 (×3): 14 mg via TRANSDERMAL
  Filled 2024-06-30: qty 1

## 2024-06-30 MED ORDER — IOHEXOL 350 MG/ML SOLN
75.0000 mL | Freq: Once | INTRAVENOUS | Status: AC | PRN
  Administered 2024-06-30: 75 mL via INTRAVENOUS

## 2024-06-30 MED ORDER — LORAZEPAM 1 MG PO TABS: 1.0000 mg | ORAL_TABLET | ORAL | Status: DC | PRN

## 2024-06-30 MED ORDER — ACETAMINOPHEN 325 MG PO TABS: 650.0000 mg | ORAL_TABLET | Freq: Four times a day (QID) | ORAL | Status: DC | PRN

## 2024-06-30 MED ORDER — DIGOXIN 0.0625 MG HALF TABLET
62.5000 ug | ORAL_TABLET | Freq: Every day | ORAL | Status: DC
  Administered 2024-07-01 – 2024-07-02 (×2): 62.5 ug via ORAL

## 2024-06-30 MED ORDER — SODIUM CHLORIDE 0.9% FLUSH
3.0000 mL | Freq: Two times a day (BID) | INTRAVENOUS | Status: DC
  Administered 2024-06-30 – 2024-07-02 (×4): 3 mL via INTRAVENOUS

## 2024-06-30 MED ORDER — THIAMINE HCL 100 MG/ML IJ SOLN
100.0000 mg | Freq: Every day | INTRAMUSCULAR | Status: DC
  Filled 2024-06-30: qty 2

## 2024-06-30 MED ORDER — SODIUM CHLORIDE 0.9 % IV SOLN: 250.0000 mL | INTRAVENOUS | Status: AC | PRN

## 2024-06-30 MED ORDER — SODIUM CHLORIDE 0.9% FLUSH
3.0000 mL | INTRAVENOUS | Status: DC | PRN
  Administered 2024-07-01: 3 mL via INTRAVENOUS

## 2024-06-30 MED ORDER — ADULT MULTIVITAMIN W/MINERALS CH
1.0000 | ORAL_TABLET | Freq: Every day | ORAL | Status: DC
  Administered 2024-06-30 – 2024-07-02 (×3): 1 via ORAL
  Filled 2024-06-30: qty 1

## 2024-06-30 MED ORDER — LORAZEPAM 2 MG/ML IJ SOLN: 1.0000 mg | INTRAMUSCULAR | Status: DC | PRN

## 2024-06-30 MED ORDER — THIAMINE MONONITRATE 100 MG PO TABS
100.0000 mg | ORAL_TABLET | Freq: Every day | ORAL | Status: DC
  Administered 2024-06-30 – 2024-07-02 (×3): 100 mg via ORAL
  Filled 2024-06-30: qty 1

## 2024-06-30 MED ORDER — ONDANSETRON HCL 4 MG PO TABS: 4.0000 mg | ORAL_TABLET | Freq: Four times a day (QID) | ORAL | Status: DC | PRN

## 2024-06-30 NOTE — ED Notes (Signed)
 Per PA Carlo okay for lobby

## 2024-06-30 NOTE — ED Provider Notes (Signed)
 Care was taken over from Dr. Ginger.  Patient had presented with some chest pain and shortness of breath.  He had a recent admission for CHF exacerbation.  His echo shows a low EF at less than 20.  He has been having the symptoms for the last couple days.  He says it is worse when he is walking around and better with rest.  He does not have any increased leg swelling.  No fevers or flulike symptoms.  He does report that yesterday and today he had bloody bowel movements.  His Hemoccult is positive.  His troponins are mildly elevated at 107 and 87.  His BNP is 3700 which is better than 11,000 on his recent admission.  Discussed with cardiology, Dr. Donnel who does not advise any emergent cardiac intervention but is okay with him being admitted for diuresis and monitoring of his rectal bleeding.  Will consult the hospitalist.  Cardiology will follow along.  Discussed with Dr. Tobie.   Lenor Hollering, MD 06/30/24 [phone]

## 2024-06-30 NOTE — ED Provider Triage Note (Signed)
 Emergency Medicine Provider Triage Evaluation Note  Jesse Key , a 58 y.o. male  was evaluated in triage.  Pt complains of chest tightness and SOB since this morning. He reports that he was walking to get some food and felt the chest tightness. Resolved some with rest. Started again with walking. He reports he feels it is gradually improving as well. Currently homeless. Was just discharged from the hospital on the 26th for CHF exacerbation. Additionally, he reports that he has had 2 bloody Bms since yesterday, one today and one yesterday. Denies any abdominal pain, nausea, vomiting, or rectal pain. Has not had this problem previously  Review of Systems  Positive:  Negative:   Physical Exam  BP 110/68 (BP Location: Left Arm)   Pulse 100   Temp 99 F (37.2 C) (Oral)   Resp 16   Ht 5' 5 (1.651 m)   Wt 61.2 kg   SpO2 100%   BMI 22.47 kg/m  Gen:   Awake, no distress   Resp:  Normal effort  MSK:   Moves extremities without difficulty  Other:    Medical Decision Making  Medically screening exam initiated at 9:56 AM.  Appropriate orders placed.  Jesse Key was informed that the remainder of the evaluation will be completed by another provider, this initial triage assessment does not replace that evaluation, and the importance of remaining in the ED until their evaluation is complete.  Chest pain work up.    Bernis Ernst, [place] 06/30/24 [phone]

## 2024-06-30 NOTE — H&P (Signed)
 " History and Physical    Patient: Jesse Key FMW:[id number] DOB: [birth year] DOA: 06/30/2024 DOS: the patient was seen and examined on 06/30/2024 . PCP: Celestia Rosaline SQUIBB, NP  Patient coming from: Homeless Chief complaint: Chief Complaint  Patient presents with   Rectal Bleeding   HPI:  Jesse Key is a 58 y.o. male with past medical history  of  heart failure, CKD nonobstructive CAD, tobacco use, prior alcohol abuse, currently homeless comes for sob and rectal bleeding.  Patient at bedside is in no distress breathing comfortably and stable.  ED Course:  Vital signs in the ED were notable for the following:  Vitals:   06/30/24 1315 06/30/24 1512 06/30/24 1645 06/30/24 1815  BP: 111/66  113/72 124/82  Pulse: 94  100 (!) 103  Temp:  98.2 F (36.8 C)    Resp: 16  16 16   Height:      Weight:      SpO2: 100%  100% 100%  TempSrc:  Oral    BMI (Calculated):       >>ED evaluation thus far shows: - CMP shows potassium 5.2 glucose 145 BUN of 23 creatinine of 1.25.  LFTs within normal limits. -proBNP of 3740, initial troponin of 107 repeat troponin at 87,EKG sinus rhythm at 98 PR of 186 right atrial enlargement, right axis deviation, QTc of 495, LVH with QRS widening at 148 ms and repolarization abnormality.  On previous EKG patient does have left bundle branch block and biatrial enlargement. - Respiratory panel is ordered and pending. -Fecal occult is positive.    >>While in the ED patient received the following: Medications  iohexol  (OMNIPAQUE ) 350 MG/ML injection 75 mL (75 mLs Intravenous Contrast Given 06/30/24 1417)   Review of Systems  Respiratory:  Positive for shortness of breath.   Gastrointestinal:  Positive for blood in stool.   Past Medical History:  Diagnosis Date   Asthma    Shellfish allergy    Past Surgical History:  Procedure Laterality Date   RIGHT/LEFT HEART CATH AND CORONARY ANGIOGRAPHY N/A 11/24/2023   Procedure: RIGHT/LEFT HEART CATH AND  CORONARY ANGIOGRAPHY;  Surgeon: Cherrie Toribio SAUNDERS, MD;  Location: [facility];  Service: Cardiovascular;  Laterality: N/A;    reports that he has been smoking cigarettes. He has never used smokeless tobacco. He reports current alcohol use. He reports that he does not use drugs. Allergies[1] Family History  Problem Relation Age of Onset   Diabetes Mother    Hypertension Mother    Diabetes Father    Hypertension Father    Prior to Admission medications  Medication Sig Start Date End Date Taking? Authorizing Provider  albuterol  (VENTOLIN  HFA) 108 (90 Base) MCG/ACT inhaler Inhale 1-2 puffs into the lungs every 6 (six) hours as needed for wheezing or shortness of breath. 06/28/24   Arrien, Elidia Toribio, MD  Digoxin  (LANOXIN ) 62.5 MCG TABS Take 1 tablet by mouth daily. 06/29/24   Arrien, Elidia Toribio, MD  empagliflozin  (JARDIANCE ) 10 MG TABS tablet Take 1 tablet (10 mg total) by mouth daily before breakfast. 06/28/24   Arrien, Elidia Toribio, MD  furosemide  (LASIX ) 40 MG tablet Take 1 tablet (40 mg total) by mouth daily. 06/28/24   Arrien, Elidia Toribio, MD  rosuvastatin  (CRESTOR ) 20 MG tablet Take 1 tablet (20 mg total) by mouth daily. 06/28/24   Arrien, Elidia Toribio, MD  sacubitril -valsartan  (ENTRESTO ) 24-26 MG Take 1 tablet by mouth 2 (two) times daily. 06/28/24   Arrien, Elidia Toribio, MD  Vitals:   06/30/24 1315 06/30/24 1512 06/30/24 1645 06/30/24 1815  BP: 111/66  113/72 124/82  Pulse: 94  100 (!) 103  Resp: 16  16 16   Temp:  98.2 F (36.8 C)    TempSrc:  Oral    SpO2: 100%  100% 100%  Weight:      Height:       Physical Exam Vitals reviewed.  Constitutional:      General: He is not in acute distress.    Appearance: He is not ill-appearing.  HENT:     Head: Normocephalic.  Eyes:     Extraocular Movements: Extraocular movements intact.  Cardiovascular:     Rate and Rhythm:  Normal rate and regular rhythm.     Heart sounds: Normal heart sounds.  Pulmonary:     Breath sounds: Rales present.  Abdominal:     General: There is no distension.     Palpations: Abdomen is soft.     Tenderness: There is no abdominal tenderness.  Musculoskeletal:     Right lower leg: No edema.     Left lower leg: No edema.  Neurological:     General: No focal deficit present.     Mental Status: He is alert and oriented to person, place, and time.     Labs on Admission: I have personally reviewed following labs and imaging studies CBC: Recent Labs  Lab 06/25/24 0333 06/26/24 0315 06/27/24 0505 06/28/24 0500 06/30/24 0922  WBC 22.3* 21.8* 17.2* 10.2 11.5*  NEUTROABS  --  18.6*  --   --   --   HGB 14.2 14.5 14.6 14.9 14.8  HCT 44.2 44.2 46.3 47.5 46.8  MCV 74.2* 73.4* 74.1* 74.7* 76.1*  PLT 262 265 244 216 210   Basic Metabolic Panel: Recent Labs  Lab 06/24/24 0622 06/25/24 0333 06/26/24 0315 06/27/24 0505 06/28/24 0500 06/30/24 0922  NA  --  140 139 138 139 137  K  --  4.7 4.8 4.4 4.6 5.2*  CL  --  101 103 104 107 102  CO2  --  22 26 25 25 25   GLUCOSE  --  120* 118* 107* 88 145*  BUN  --  29* 42* 30* 24* 23*  CREATININE 1.36* 1.78* 1.61* 1.38* 1.36* 1.25*  CALCIUM   --  9.4 9.4 9.1 8.6* 9.4  MG 2.2  --  2.4 2.6* 2.3  --   PHOS 1.9*  --   --   --   --   --    GFR: Estimated Creatinine Clearance: 54.4 mL/min (A) (by C-G formula based on SCr of 1.25 mg/dL (H)). Liver Function Tests: Recent Labs  Lab 06/25/24 0333 06/25/24 1329 06/26/24 0315 06/30/24 0922  AST 51* 43* 30 26  ALT 32 30 27 22   ALKPHOS 95 100 86 84  BILITOT 0.5 0.5 0.4 0.3  PROT 6.9 7.2 7.0 7.3  ALBUMIN 3.7 3.7 3.6 4.1   No results for input(s): LIPASE, AMYLASE in the last 168 hours. No results for input(s): AMMONIA in the last 168 hours. Recent Labs    04/12/24 1014 05/21/24 1004 06/03/24 1444 06/20/24 1519 06/23/24 2241 06/24/24 0622 06/25/24 0333 06/26/24 0315  06/27/24 0505 06/28/24 0500 06/30/24 0922  BUN 17 18 29* 22* 17  --  29* 42* 30* 24* 23*  CREATININE 1.33* 1.40* 1.87* 1.48* 1.41* 1.36* 1.78* 1.61* 1.38* 1.36* 1.25*    Cardiac Enzymes: No results for input(s): CKTOTAL, CKMB, CKMBINDEX, TROPONINI in the last 168 hours. BNP (last 3  results) Recent Labs    06/20/24 1519 06/23/24 2241 06/30/24 1232  PROBNP 7,627.0* 11,603.0* 3,740.0*   HbA1C: No results for input(s): HGBA1C in the last 72 hours. CBG: No results for input(s): GLUCAP in the last 168 hours. Lipid Profile: No results for input(s): CHOL, HDL, LDLCALC, TRIG, CHOLHDL, LDLDIRECT in the last 72 hours. Thyroid Function Tests: No results for input(s): TSH, T4TOTAL, FREET4, T3FREE, THYROIDAB in the last 72 hours. Anemia Panel: No results for input(s): VITAMINB12, FOLATE, FERRITIN, TIBC, IRON , RETICCTPCT in the last 72 hours. Urine analysis:    Component Value Date/Time   COLORURINE YELLOW 06/24/2024 0755   APPEARANCEUR CLEAR 06/24/2024 0755   LABSPEC 1.010 06/24/2024 0755   PHURINE 7.0 06/24/2024 0755   GLUCOSEU NEGATIVE 06/24/2024 0755   HGBUR SMALL (A) 06/24/2024 0755   BILIRUBINUR NEGATIVE 06/24/2024 0755   KETONESUR NEGATIVE 06/24/2024 0755   PROTEINUR 30 (A) 06/24/2024 0755   NITRITE NEGATIVE 06/24/2024 0755   LEUKOCYTESUR NEGATIVE 06/24/2024 0755   Radiological Exams on Admission: CT ABDOMEN PELVIS W CONTRAST Result Date: 06/30/2024 EXAM: CT ABDOMEN AND PELVIS WITH CONTRAST 06/30/2024 02:15:52 PM TECHNIQUE: CT of the abdomen and pelvis was performed with the administration of 75 mL of iohexol  (OMNIPAQUE ) 350 MG/ML injection. Multiplanar reformatted images are provided for review. Automated exposure control, iterative reconstruction, and/or weight-based adjustment of the mA/kV was utilized to reduce the radiation dose to as low as reasonably achievable. COMPARISON: CT angiography chest 12:28.25 CLINICAL HISTORY:  Rectal bleeding, tachycardia, chest pain, shortness of breath, fatigue. Rule out infection, diverticulitis, or other acute abnormality. FINDINGS: LOWER CHEST: No acute abnormality. LIVER: The liver is unremarkable. GALLBLADDER AND BILE DUCTS: Gallbladder is unremarkable. No biliary ductal dilatation. SPLEEN: No acute abnormality. PANCREAS: No acute abnormality. ADRENAL GLANDS: No acute abnormality. KIDNEYS, URETERS AND BLADDER: Fluid density lesions of the kidneys likely represent simple renal cysts. Simple renal cysts do not require additional follow-up unless clinically indicated due to signs/symptoms. No stones in the kidneys or ureters. No hydronephrosis. No perinephric or periureteral stranding. Urinary bladder is unremarkable. GI AND BOWEL: Stomach demonstrates no acute abnormality. No small or large bowel thickening or dilatation. The appendix is unremarkable. There is no bowel obstruction. PERITONEUM AND RETROPERITONEUM: No ascites. No free air. VASCULATURE: Moderate to severe atherosclerotic plaque of the aorta. LYMPH NODES: No lymphadenopathy. REPRODUCTIVE ORGANS: The prostate is unremarkable. BONES AND SOFT TISSUES: No acute osseous abnormality. No focal soft tissue abnormality. IMPRESSION: 1. No acute findings in the abdomen or pelvis. Electronically signed by: Morgane Naveau MD 06/30/2024 03:43 PM EST RP Workstation: [id number]   CT Angio Chest PE W and/or Wo Contrast Result Date: 06/30/2024 EXAM: CTA CHEST 06/30/2024 02:15:52 PM TECHNIQUE: CTA of the chest was performed after the administration of intravenous contrast. Multiplanar reformatted images are provided for review. MIP images are provided for review. Automated exposure control, iterative reconstruction, and/or weight based adjustment of the mA/kV was utilized to reduce the radiation dose to as low as reasonably achievable. COMPARISON: ct angio chest 06/23/24 CLINICAL HISTORY: Pulmonary embolism (PE) suspected, high prob; Chest pain,  shortness of breath, tachycardia, recent admission. Also recent heart failure and productive cough. Rule out pneumonia versus PE versus fluid overload versus other. Rectal bleeding, tachycardia, chest pain, shortness of breath, fatigue. Rule out infection, FINDINGS: PULMONARY ARTERIES: Pulmonary arteries are adequately opacified for evaluation. No acute pulmonary embolus. Main pulmonary artery is normal in caliber. MEDIASTINUM: The heart demonstrates 4-vessel coronary artery calcification and left ventricle enlargement. Moderate atherosclerotic plaque is present. There is  no acute abnormality of the thoracic aorta. LYMPH NODES: No mediastinal, hilar or axillary lymphadenopathy. LUNGS AND PLEURA: Diffuse mild bronchial wall thickening. No focal consolidation or pulmonary edema. No evidence of pleural effusion or pneumothorax. UPPER ABDOMEN: A fluid lesion of the left kidney likely represents a simple renal cyst. Simple renal cysts do not require additional follow-up unless clinically indicated due to signs/symptoms. SOFT TISSUES AND BONES: No acute bone or soft tissue abnormality. IMPRESSION: 1. No pulmonary embolism. 2. Left ventricle enlargement and 4-vessel coronary artery calcification. Electronically signed by: Morgane Naveau MD 06/30/2024 03:41 PM EST RP Workstation: [id number]   DG Chest 2 View Result Date: 06/30/2024 CLINICAL DATA:  Chest pain. EXAM: CHEST - 2 VIEW COMPARISON:  June 23, 2024 FINDINGS: The heart size and mediastinal contours are within normal limits. No acute infiltrate, pleural effusion or pneumothorax is identified. The visualized skeletal structures are unremarkable. IMPRESSION: No active cardiopulmonary disease. Electronically Signed   By: Suzen Dials M.D.   On: 06/30/2024 11:52   Data Reviewed: Relevant notes from primary care and specialist visits, past discharge summaries as available in EHR, including Care Everywhere . Prior diagnostic testing as pertinent to current  admission diagnoses, Updated medications and problem lists for reconciliation .ED course, including vitals, labs, imaging, treatment and response to treatment,Triage notes, nursing and pharmacy notes and ED provider's notes.Notable results as noted in HPI.Discussed case with EDMD/ ED APP/ or Specialty MD on call and as needed.   Assessment & Plan  >> Short of breath/mild acute on chronic CHF exacerbation: Patient presenting with shortness of breath has basilar rales on auscultation is oxygenating within normal limits at bedside on room air, CT PE protocol done today is negative for PE but does show left ventricular enlargement and 4 vessel CAD.chest xray is negative for any cardiopulmonary findings. Cont digoxin  and Prn lasix . Jardiance  /Entresto  held due to his renal function.    >>BRBPR: 2 episodes of bloody bowel movement today, Per pt report. CBC shows stable hb at 14.8 from the 26th of Dec.  No h/o same. Type/screen/ IV PPI. CT of the abdomen and pelvis today is negative for any acute findings in the abdomen or pelvis.  GI consult has been requested. Npo after MN.    >>Essential Htn: Vitals:   06/30/24 0914 06/30/24 1315 06/30/24 1645 06/30/24 1815  BP: 110/68 111/66 113/72 124/82  Lasix  2 doses then PRN, held jardiance  and entresto .     >>Asthma/COPD: PRN albuterol . Recently completed steroid taper and is stable.  Nicotine  patch and tobacco counseling.   >> CKD stage IIIa: Lab Results  Component Value Date   CREATININE 1.25 (H) 06/30/2024   CREATININE 1.36 (H) 06/28/2024   CREATININE 1.38 (H) 06/27/2024  Currently stable and we will monitor. Avoid renally cleared meds.  Limit contrast or as deemed appropriate based on eGFR.   >>Cocaine abuse: Last pos on 06/26/24.  UDS today is pending.    >> Alcohol abuse: Patient denies any recently.  CIWA precautions,follow mag level.   >>CAD/Hyperlipidemia: Cont statin, no antiplatelet 2/2 to rectal bleeding.   >>  Hyperkalemia: Potassium today at 5.2 mildly elevated will currently hold patient's Jardiance  and Entresto .  Will repeat BMP and follow.    DVT prophylaxis:  SCD's.   Consults:  GI: Dr.Pyrtle.   Advance Care Planning:    Code Status: Full Code   Family Communication:  None.  Disposition Plan:  TBD.  Severity of Illness: The appropriate patient status for this patient is OBSERVATION. Observation  status is judged to be reasonable and necessary in order to provide the required intensity of service to ensure the patient's safety. The patient's presenting symptoms, physical exam findings, and initial radiographic and laboratory data in the context of their medical condition is felt to place them at decreased risk for further clinical deterioration. Furthermore, it is anticipated that the patient will be medically stable for discharge from the hospital within 2 midnights of admission.   Unresulted Labs (From admission, onward)     Start     Ordered   07/01/24 0500  Basic metabolic panel  Tomorrow morning,   R        06/30/24 1840   07/01/24 0500  CBC  Tomorrow morning,   R        06/30/24 1840   06/30/24 1915  Basic metabolic panel  ONCE - STAT,   STAT        06/30/24 1915   06/30/24 1851  Urine Drug Screen  Add-on,   AD        06/30/24 1851   06/30/24 1330  Urinalysis, w/ Reflex to Culture (Infection Suspected) -Urine, Clean Catch  Once,   URGENT       Question:  Specimen Source  Answer:  Urine, Clean Catch   06/30/24 1330   06/30/24 0957  Resp panel by RT-PCR (RSV, Flu A&B, Covid) Anterior Nasal Swab  (SARS Coronavirus 2 by RT PCR (hospital order, performed in [facility] hospital lab) *cepheid single result test*)  Once,   URGENT        06/30/24 0956   06/30/24 0957  Miscellaneous [facility] test (send-out)  Once,   R        06/30/24 0957   06/30/24 0934  ABO/Rh  ONCE - STAT,   STAT       Comments: SECOND TYPE NEEDED AT DIFFERENT TIME THAN TYPE AND SCREEN    06/30/24 0933            Meds ordered this encounter  Medications   iohexol  (OMNIPAQUE ) 350 MG/ML injection 75 mL   OR Linked Order Group    acetaminophen  (TYLENOL ) tablet 650 mg    acetaminophen  (TYLENOL ) suppository 650 mg   OR Linked Order Group    ondansetron  (ZOFRAN ) tablet 4 mg    ondansetron  (ZOFRAN ) injection 4 mg   sodium chloride  flush (NS) 0.9 % injection 3 mL   sodium chloride  flush (NS) 0.9 % injection 3 mL   0.9 %  sodium chloride  infusion   pantoprazole  (PROTONIX ) injection 40 mg   nicotine  (NICODERM CQ  - dosed in mg/24 hours) patch 14 mg   OR Linked Order Group    LORazepam  (ATIVAN ) tablet 1-4 mg     CIWA-AR < 5 =:   0 mg     CIWA-AR 5 -10 =:   1 mg     CIWA-AR 11 -15 =:   2 mg     CIWA-AR 16 -20 =:   3 mg     CIWA-AR 16 -20 =:   Recheck CIWA-AR in 1 hour; if > 20 notify MD     CIWA-AR > 20 =:   4 mg     CIWA-AR > 20 =:   Call Rapid Response    LORazepam  (ATIVAN ) injection 1-4 mg     CIWA-AR < 5 =:   0 mg     CIWA-AR 5 -10 =:   1 mg     CIWA-AR 11 -15 =:   2 mg  CIWA-AR 16 -20 =:   3 mg     CIWA-AR 16 -20 =:   Recheck CIWA-AR in 1 hour; if > 20 notify MD     CIWA-AR > 20 =:   4 mg     CIWA-AR > 20 =:   Call Rapid Response   OR Linked Order Group    thiamine  (VITAMIN B1) tablet 100 mg    thiamine  (VITAMIN B1) injection 100 mg   folic acid  (FOLVITE ) tablet 1 mg   multivitamin with minerals tablet 1 tablet   albuterol  (PROVENTIL ) (2.5 MG/3ML) 0.083% nebulizer solution 2.5 mg   Digoxin  TABS 1 tablet   furosemide  (LASIX ) tablet 20 mg   rosuvastatin  (CRESTOR ) tablet 20 mg     Orders Placed This Encounter  Procedures   Resp panel by RT-PCR (RSV, Flu A&B, Covid) Anterior Nasal Swab   DG Chest 2 View   CT ABDOMEN PELVIS W CONTRAST   CT Angio Chest PE W and/or Wo Contrast   Comprehensive metabolic panel   CBC   Protime-INR - (order if patient is taking Coumadin / Warfarin)   Pro Brain natriuretic peptide   Urinalysis, w/ Reflex to Culture (Infection Suspected) -Urine,  Clean Catch   Miscellaneous [facility] test (send-out)   Basic metabolic panel   CBC   Urine Drug Screen   Basic metabolic panel   Diet 2 gram sodium Room service appropriate? Yes; Fluid consistency: Thin   Diet NPO time specified   Document Height and Actual Weight   SCDs   Cardiac Monitoring Continuous x 24 hours Indications for use: Other; other indications for use: Monitor for ischemia   Vital signs   Notify physician (specify)   Initiate Adult Central Line Maintenance and Catheter Clearance Protocol for patients with central line (CVC, PICC, Port, Hemodialysis, Trialysis)   If patient diabetic or glucose greater than 140 notify physician for Sliding Scale Insulin Orders   Intake and Output   Initiate CHG Protocol for patients in ICU/SD or any patient with a central line or foley catheter   Do not place and if present remove PureWick   Initiate Oral Care Protocol   Initiate Carrier Fluid Protocol   RN may order General Admission PRN Orders utilizing General Admission PRN medications (through manage orders) for the following patient needs: allergy symptoms (Claritin), cold sores (Carmex), cough (Robitussin DM), eye irritation (Liquifilm Tears), hemorrhoids (Tucks), indigestion (Maalox), minor skin irritation (Hydrocortisone Cream), muscle pain Lucienne Gay), nose irritation (saline nasal spray) and sore throat (Chloraseptic spray).   Ambulate with assistance   Nurse to provide smoking / tobacco cessation education   Clinical institute withdrawal assessment   Vital signs every 6 hours X 48 hours, then per unit protocol   Refer to Sidebar Report for reference: ETOH Withdrawal Guidelines   Clinical Institute Withdrawal Assessent (CIWA)   If Ativan  given, reassess Clinical Institute Withdrawal Assessment (CIWA) with blood pressure and pulse rate within 1 hour of Ativan  administration   Notify Pharmacy to change IV Ativan  to PO if tolerating POs well.   Notify physician (specify)   Full code    Inpatient consult to Cardiology   Consult to hospitalist   Consult to Transition of Care Team   Airborne and Contact precautions   Pulse oximetry check with vital signs   Oxygen therapy Mode or (Route): Nasal cannula; Liters Per Minute: 2; Keep O2 saturation between: greater than 92 %   POC occult blood, ED   ED EKG   Type and  screen [facility]   ABO/Rh   Place in observation (patient's expected length of stay will be less than 2 midnights)   Aspiration precautions   Fall precautions    Author: Mario LULLA Blanch, MD 12 pm- 8 pm. [facility]. 06/30/2024 7:20 PM Please note for any communication after hours contact [facility] Assigned provider on call on Amion.       [1]  Allergies Allergen Reactions   Shrimp [Shellfish Allergy] Anaphylaxis and Itching    ALL SHELLFISH   Shellfish Allergy Itching   "

## 2024-06-30 NOTE — ED Triage Notes (Signed)
 Patient arrives via [facility] for lower gi bleed. Recent discharge for chf exacerbation. Experienced blood in stool yesterday and today x1. Complaint of chest tightness with breathing, coughing, and movement. No thinners. No trauma. No shortness of breath, N,V.   EMS vitals 120/70 HR 100 98 on room air 16 RR 154 CBG  12 lead unremarkable  No peripheral access. GCS 15.

## 2024-06-30 NOTE — ED Provider Notes (Signed)
 " [facility] EMERGENCY DEPARTMENT AT [facility] Provider Note   CSN: [id number] Arrival date & time: 06/30/24  9093     Patient presents with: Rectal Bleeding   Jesse Key is a 58 y.o. male.   The history is provided by the patient and medical records. No language interpreter was used.  Rectal Bleeding Quality:  Bright red and maroon Amount:  Moderate Duration:  2 days Timing:  Intermittent Similar prior episodes: no   Relieved by:  Nothing Worsened by:  Nothing Ineffective treatments:  None tried Associated symptoms: recent illness   Associated symptoms: no abdominal pain, no fever, no light-headedness and no vomiting        Prior to Admission medications  Medication Sig Start Date End Date Taking? Authorizing Provider  albuterol  (VENTOLIN  HFA) 108 (90 Base) MCG/ACT inhaler Inhale 1-2 puffs into the lungs every 6 (six) hours as needed for wheezing or shortness of breath. 06/28/24   Arrien, Elidia Sieving, MD  Digoxin  (LANOXIN ) 62.5 MCG TABS Take 1 tablet by mouth daily. 06/29/24   Arrien, Mauricio Daniel, MD  empagliflozin  (JARDIANCE ) 10 MG TABS tablet Take 1 tablet (10 mg total) by mouth daily before breakfast. 06/28/24   Arrien, Elidia Sieving, MD  furosemide  (LASIX ) 40 MG tablet Take 1 tablet (40 mg total) by mouth daily. 06/28/24   Arrien, Elidia Sieving, MD  rosuvastatin  (CRESTOR ) 20 MG tablet Take 1 tablet (20 mg total) by mouth daily. 06/28/24   Arrien, Elidia Sieving, MD  sacubitril -valsartan  (ENTRESTO ) 24-26 MG Take 1 tablet by mouth 2 (two) times daily. 06/28/24   Arrien, Elidia Sieving, MD    Allergies: Shrimp [shellfish allergy] and Shellfish allergy    Review of Systems  Constitutional:  Positive for chills and fatigue. Negative for fever.  HENT:  Positive for congestion.   Eyes:  Negative for visual disturbance.  Respiratory:  Positive for cough, chest tightness and shortness of breath. Negative for wheezing.   Cardiovascular:   Positive for chest pain. Negative for palpitations.  Gastrointestinal:  Positive for anal bleeding, blood in stool and hematochezia. Negative for abdominal pain, constipation, diarrhea, nausea, rectal pain and vomiting.  Genitourinary:  Negative for dysuria.  Musculoskeletal:  Negative for back pain and neck pain.  Skin:  Negative for rash.  Neurological:  Negative for light-headedness and headaches.  Psychiatric/Behavioral:  Negative for agitation and confusion.   All other systems reviewed and are negative.   Updated Vital Signs BP 110/68 (BP Location: Left Arm)   Pulse 100   Temp 99 F (37.2 C) (Oral)   Resp 16   Ht 5' 5 (1.651 m)   Wt 61.2 kg   SpO2 100%   BMI 22.47 kg/m   Physical Exam Vitals and nursing note reviewed.  Constitutional:      General: He is not in acute distress.    Appearance: He is well-developed. He is not ill-appearing, toxic-appearing or diaphoretic.  HENT:     Head: Normocephalic and atraumatic.     Nose: Congestion present. No rhinorrhea.     Mouth/Throat:     Pharynx: No oropharyngeal exudate or posterior oropharyngeal erythema.  Eyes:     Extraocular Movements: Extraocular movements intact.     Conjunctiva/sclera: Conjunctivae normal.     Pupils: Pupils are equal, round, and reactive to light.  Cardiovascular:     Rate and Rhythm: Regular rhythm. Tachycardia present.     Pulses: Normal pulses.     Heart sounds: No murmur heard. Pulmonary:  Effort: Pulmonary effort is normal. No respiratory distress.     Breath sounds: Rales present. No wheezing or rhonchi.  Chest:     Chest wall: No tenderness.  Abdominal:     General: Abdomen is flat.     Palpations: Abdomen is soft.     Tenderness: There is no abdominal tenderness. There is no guarding or rebound.  Musculoskeletal:        General: No swelling or tenderness.     Cervical back: Neck supple. No tenderness.  Skin:    General: Skin is warm and dry.     Capillary Refill: Capillary  refill takes less than 2 seconds.     Findings: No erythema or rash.  Neurological:     General: No focal deficit present.     Mental Status: He is alert.     Sensory: No sensory deficit.     Motor: No weakness.  Psychiatric:        Mood and Affect: Mood normal.     (all labs ordered are listed, but only abnormal results are displayed) Labs Reviewed  COMPREHENSIVE METABOLIC PANEL WITH GFR - Abnormal; Notable for the following components:      Result Value   Potassium 5.2 (*)    Glucose, Bld 145 (*)    BUN 23 (*)    Creatinine, Ser 1.25 (*)    All other components within normal limits  CBC - Abnormal; Notable for the following components:   WBC 11.5 (*)    RBC 6.15 (*)    MCV 76.1 (*)    MCH 24.1 (*)    RDW 18.7 (*)    All other components within normal limits  PRO BRAIN NATRIURETIC PEPTIDE - Abnormal; Notable for the following components:   Pro Brain Natriuretic Peptide 3,740.0 (*)    All other components within normal limits  TROPONIN T, HIGH SENSITIVITY - Abnormal; Notable for the following components:   Troponin T High Sensitivity 107 (*)    All other components within normal limits  TROPONIN T, HIGH SENSITIVITY - Abnormal; Notable for the following components:   Troponin T High Sensitivity 87 (*)    All other components within normal limits  RESP PANEL BY RT-PCR (RSV, FLU A&B, COVID)  RVPGX2  PROTIME-INR  URINALYSIS, W/ REFLEX TO CULTURE (INFECTION SUSPECTED)  TYPE AND SCREEN  ABO/RH    EKG: EKG Interpretation Date/Time:  Sunday June 30 2024 10:05:07 EST Ventricular Rate:  98 PR Interval:  186 QRS Duration:  148 QT Interval:  388 QTC Calculation: 495 R Axis:   113  Text Interpretation: Normal sinus rhythm Right atrial enlargement Right axis deviation Left ventricular hypertrophy with QRS widening and repolarization abnormality ( Cornell product , Romhilt-Estes ) Abnormal ECG When compared with ECG of 23-Jun-2024 22:23, PREVIOUS ECG IS PRESENT when  compared to prior,  similar appearance No STEMI Confirmed by Ginger Barefoot ([id number]) on 06/30/2024 1:02:04 PM  Radiology: DG Chest 2 View Result Date: 06/30/2024 CLINICAL DATA:  Chest pain. EXAM: CHEST - 2 VIEW COMPARISON:  June 23, 2024 FINDINGS: The heart size and mediastinal contours are within normal limits. No acute infiltrate, pleural effusion or pneumothorax is identified. The visualized skeletal structures are unremarkable. IMPRESSION: No active cardiopulmonary disease. Electronically Signed   By: Suzen Dials M.D.   On: 06/30/2024 11:52     Procedures   Medications Ordered in the ED  iohexol  (OMNIPAQUE ) 350 MG/ML injection 75 mL (75 mLs Intravenous Contrast Given 06/30/24 1417)  Medical Decision Making Amount and/or Complexity of Data Reviewed Labs: ordered. Radiology: ordered.  Risk Prescription drug management.    Jesse Key is a 57 y.o. male with a past medical history significant for CHF with recent admission for exacerbation, asthma, CKD, and gout who presents with 2 days of intermittent large-volume rectal bleeding, congestion, cough, exertional chest pain, and shortness of breath.  According to patient, he had done fairly well for discharge but then over the last 2 days started having all the symptoms.  He reports he has had some congestion and cough and is coughing up some phlegm.  He reports he is having exertional chest pain and shortness of breath and his heart rate has been fast.  He reports no nausea or vomiting and has not had any urinary changes but reports he has had large amounts of gross blood with bowel movements.  He has never had this before.  He denies any rectal pain or rectal changes and no history of hemorrhoids.  He denied any rectal trauma.  He denies any abdominal or chest trauma.  No history of diverticulitis to his knowledge.  No history of blood clots.  Denies any leg pain and reports that his leg  swelling still has improved from prior.  On exam, he has some mild edema to legs that is improved per patient.  Lungs have rales in the bases.  Chest is nontender.  Back and flanks nontender.  Abdomen was nontender with intact bowel sounds.  No focal neurologic deficits.  Patient is near febrile, tachycardic and blood pressure was around 200 systolic.  EKG showed abnormalities with deeper T wave inversions compared to prior.  Given the patient's exertional chest pain and shortness of breath with some EKG changes, he will get cardiac workup with troponin and BNP.  With his shortness of breath he had a chest x-ray that did not show large abnormality initially however with the tachycardia and chest pain we will get workup to rule out a pulmonary embolism.  Will also get a CT scan of the ab pelvis to look for diverticulitis or other acute cause of the rectal bleeding and symptoms.       Labs began to return from triage and he does have a leukocytosis that he did not have at discharge.  Hemoglobin appears stable and he is not anemic.  INR is normal.  His troponin is 107 but that is improved from prior.  Will trend to make sure it is not uptrending.  Metabolic panel did show mild hyperkalemia and creatinine is similar at 1.25.  Given the ongoing pandemic and amount of fluid in community will get a viral swab with COVID/flu/RSV evaluation.  He will get the CT scans and other labs and anticipate reassessment to determine disposition.      Final diagnoses:  Rectal bleeding  Chest pain, unspecified type  Shortness of breath      Clinical Impression: 1. Rectal bleeding   2. Chest pain, unspecified type   3. Shortness of breath     Disposition: Care transferred to oncoming team to wait for workup results to complete to determine disposition.  Anticipate discharge if workup reassuring.   This note was prepared with assistance of Conservation officer, historic buildings. Occasional wrong-word or  sound-a-like substitutions may have occurred due to the inherent limitations of voice recognition software.      Britaney Espaillat, Lonni PARAS, MD 06/30/24 1535  "

## 2024-06-30 NOTE — Consult Note (Addendum)
 "  Cardiology Consultation   Patient ID: Jesse Key MRN: [id number]; DOB: [birth year]  Admit date: 06/30/2024 Date of Consult: 06/30/2024  PCP:  Celestia Rosaline SQUIBB, NP   [facility] Providers Cardiologist:  Soyla DELENA Merck, MD  Advanced Heart Failure:  Toribio Fuel, MD       Patient Profile: Jesse Key is a 58 y.o. 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  History of Present Illness: Jesse Key is a 58 y.o. 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 Echo showed EF < 20%, normal RV, global HK, mid-mod MR. Diuresed well with IV lasix  and he was started on GDMT. R/LHC showed non-obstructive CAD, EF 25%, well compensated filling pressures with moderate to severely reduced CO (PCW 7, CI 2.07). cMRI showed LVEF 18%, mod dilated LV with septal-lateral dyssynchrony consistent with LBBB and diffuse severe HK. Normal RV, RVEF 35%. Mild-mod MR. Concern for LBBB CM. Discharged, weight 126.5 lbs.   Patient has had multiple admissions in the past for heart failure most recent is 12/21, recently seen by advanced heart failure, Dr. Fuel on 06/25/2024 and discharged on 06/28/2024 after IV diuresis and was briefly placed on milrinone  for low output and was weaned off and started on GDMT  Patient reports he noticed blood in his stool yesterday and also was complaining of chest pain with breathing, cough, shortness of breath and came to the hospital.  He denies any current chest pain, chest pain is atypical, comes and goes without any exertion,  ER workup blood pressure is stable, troponin 107, 87, trending down, proBNP is 3000 748  down from 11,000 last week, hemoglobin is stable, creatinine 1.25 baseline, potassium 5.2. EKG shows chronic left bundle branch block, with Q waves in 1 and aVL and ST-T wave changes. Chest x-ray negative. CT angio PE negative CT abdomen pelvis negative  Last cath was 11/24/2023 proximal RCA 30%, mid RCA 50%, mid left main to ostial LAD 30%, mid LAD 30%, pulmonary capillary wedge 7, cardiac index was 2.1, papi 6.1.  Last echo was 06/24/2024 EF less than 20%, global hypokinesis RV is normal in size, LA is dilated, mild MR, aortic valve sclerosis   Past Medical History:  Diagnosis Date   Asthma    Shellfish allergy     Past Surgical History:  Procedure Laterality Date   RIGHT/LEFT HEART CATH AND CORONARY ANGIOGRAPHY N/A 11/24/2023   Procedure: RIGHT/LEFT HEART CATH AND CORONARY ANGIOGRAPHY;  Surgeon: Fuel Toribio SAUNDERS, MD;  Location: [facility];  Service: Cardiovascular;  Laterality: N/A;     Home Medications:  Prior to Admission medications  Medication Sig Start Date End Date Taking? Authorizing Provider  albuterol  (VENTOLIN  HFA) 108 (90 Base) MCG/ACT inhaler Inhale 1-2 puffs into the lungs every 6 (six) hours as needed for wheezing or shortness of breath. 06/28/24   Arrien, Elidia Toribio, MD  Digoxin  (LANOXIN ) 62.5 MCG TABS Take 1 tablet by mouth daily. 06/29/24   Arrien, Mauricio Daniel, MD  empagliflozin  (JARDIANCE ) 10 MG TABS tablet Take 1 tablet (10 mg total) by mouth daily before breakfast. 06/28/24   Arrien, Mauricio  Toribio, MD  furosemide  (LASIX ) 40 MG tablet Take 1 tablet (40 mg total) by mouth daily. 06/28/24   Arrien, Elidia Toribio, MD  rosuvastatin  (CRESTOR ) 20 MG tablet Take 1 tablet (20 mg total) by mouth daily. 06/28/24   Arrien, Mauricio Daniel, MD  sacubitril -valsartan  (ENTRESTO ) 24-26 MG Take 1 tablet by mouth 2 (two) times daily. 06/28/24   Arrien, Mauricio Daniel, MD    Scheduled Meds:  nicotine   14 mg Transdermal Daily   pantoprazole  (PROTONIX ) IV   40 mg Intravenous Q12H   sodium chloride  flush  3 mL Intravenous Q12H   Continuous Infusions:  sodium chloride      PRN Meds: sodium chloride , acetaminophen  **OR** acetaminophen , ondansetron  **OR** ondansetron  (ZOFRAN ) IV, sodium chloride  flush  Allergies:   Allergies[1]  Social History:   Social History   Socioeconomic History   Marital status: Single    Spouse name: Not on file   Number of children: Not on file   Years of education: Not on file   Highest education level: Not on file  Occupational History   Not on file  Tobacco Use   Smoking status: Some Days    Current packs/day: 0.25    Types: Cigarettes   Smokeless tobacco: Never  Vaping Use   Vaping status: Never Used  Substance and Sexual Activity   Alcohol use: Yes    Comment: weekends only   Drug use: No   Sexual activity: Not on file  Other Topics Concern   Not on file  Social History Narrative   ** Merged History Encounter **       Homeless at the time may smoke one cigarette a day   Social Drivers of Health   Tobacco Use: High Risk (06/23/2024)   Patient History    Smoking Tobacco Use: Some Days    Smokeless Tobacco Use: Never    Passive Exposure: Not on file  Financial Resource Strain: High Risk (04/12/2024)   Overall Financial Resource Strain (CARDIA)    Difficulty of Paying Living Expenses: Hard  Food Insecurity: Food Insecurity Present (06/24/2024)   Epic    Worried About Programme Researcher, Broadcasting/film/video in the Last Year: Sometimes true    Ran Out of Food in the Last Year: Sometimes true  Transportation Needs: No Transportation Needs (06/24/2024)   Epic    Lack of Transportation (Medical): No    Lack of Transportation (Non-Medical): No  Recent Concern: Transportation Needs - Unmet Transportation Needs (06/04/2024)   Epic    Lack of Transportation (Medical): Yes    Lack of Transportation (Non-Medical): Yes  Physical Activity: Not on file  Stress: Stress Concern Present (06/04/2024)   [employer] of  Occupational Health - Occupational Stress Questionnaire    Feeling of Stress: To some extent  Social Connections: Not on file  Intimate Partner Violence: Not At Risk (06/24/2024)   Epic    Fear of Current or Ex-Partner: No    Emotionally Abused: No    Physically Abused: No    Sexually Abused: No  Depression (PHQ2-9): Medium Risk (06/04/2024)   Depression (PHQ2-9)    PHQ-2 Score: 9  Alcohol Screen: Not on file  Housing: High Risk (06/24/2024)   Epic    Unable to Pay for Housing in the Last Year: Yes    Number of Times Moved in the Last Year: 1    Homeless in the Last Year: Yes  Utilities: At Risk (06/24/2024)   Epic    Threatened with loss of utilities: Yes  Health Literacy: Adequate Health Literacy (01/03/2024)   B1300 Health Literacy    Frequency of need for help with medical instructions: Rarely    Family History:    Family History  Problem Relation Age of Onset   Diabetes Mother    Hypertension Mother    Diabetes Father    Hypertension Father      ROS:  Please see the history of present illness.   All other ROS reviewed and negative.     Physical Exam/Data: Vitals:   06/30/24 1315 06/30/24 1512 06/30/24 1645 06/30/24 1815  BP: 111/66  113/72 124/82  Pulse: 94  100 (!) 103  Resp: 16  16 16   Temp:  98.2 F (36.8 C)    TempSrc:  Oral    SpO2: 100%  100% 100%  Weight:      Height:       No intake or output data in the 24 hours ending 06/30/24 1845    06/30/2024    9:14 AM 06/28/2024    3:57 AM 06/27/2024    4:15 AM  Last 3 Weights  Weight (lbs) 135 lb 130 lb 6.4 oz 130 lb 4.7 oz  Weight (kg) 61.236 kg 59.149 kg 59.1 kg     Body mass index is 22.47 kg/m.  General:  Well nourished, well developed, in no acute distress HEENT: normal Neck: no JVD Vascular: No carotid bruits; Distal pulses 2+ bilaterally Cardiac:  normal S1, S2; RRR; no murmur  Lungs:  clear to auscultation bilaterally, no wheezing, rhonchi or rales  Abd: soft, nontender, no hepatomegaly   Ext: no edema Musculoskeletal:  No deformities, BUE and BLE strength normal and equal Skin: warm and dry  Neuro:  CNs 2-12 intact, no focal abnormalities noted Psych:  Normal affect    Relevant CV Studies:   Laboratory Data: High Sensitivity Troponin:  No results for input(s): TROPONINIHS in the last 720 hours.  Recent Labs  Lab 06/23/24 2241 06/24/24 0622 06/30/24 0922 06/30/24 1232  TRNPT 125* 214* 107* 87*      Chemistry Recent Labs  Lab 06/26/24 0315 06/27/24 0505 06/28/24 0500 06/30/24 0922  NA 139 138 139 137  K 4.8 4.4 4.6 5.2*  CL 103 104 107 102  CO2 26 25 25 25   GLUCOSE 118* 107* 88 145*  BUN 42* 30* 24* 23*  CREATININE 1.61* 1.38* 1.36* 1.25*  CALCIUM  9.4 9.1 8.6* 9.4  MG 2.4 2.6* 2.3  --   GFRNONAA 49* 59* 60* >60  ANIONGAP 10 9 8 10     Recent Labs  Lab 06/25/24 1329 06/26/24 0315 06/30/24 0922  PROT 7.2 7.0 7.3  ALBUMIN 3.7 3.6 4.1  AST 43* 30 26  ALT 30 27 22   ALKPHOS 100 86 84  BILITOT 0.5 0.4 0.3   Lipids No results for input(s): CHOL, TRIG, HDL, LABVLDL, LDLCALC, CHOLHDL in the last 168 hours.  Hematology Recent Labs  Lab 06/27/24 0505 06/28/24 0500 06/30/24 0922  WBC 17.2* 10.2 11.5*  RBC 6.25* 6.36* 6.15*  HGB 14.6 14.9 14.8  HCT 46.3 47.5 46.8  MCV 74.1* 74.7* 76.1*  MCH 23.4* 23.4* 24.1*  MCHC 31.5 31.4 31.6  RDW 18.2* 18.7* 18.7*  PLT 244 216 210   Thyroid  Recent Labs  Lab 06/24/24 0622  TSH 1.000    BNP Recent Labs  Lab 06/23/24 2241 06/30/24 1232  PROBNP 11,603.0* 3,740.0*    DDimer No results for input(s): DDIMER in the last 168 hours.  Radiology/Studies:  CT ABDOMEN PELVIS  W CONTRAST Result Date: 06/30/2024 EXAM: CT ABDOMEN AND PELVIS WITH CONTRAST 06/30/2024 02:15:52 PM TECHNIQUE: CT of the abdomen and pelvis was performed with the administration of 75 mL of iohexol  (OMNIPAQUE ) 350 MG/ML injection. Multiplanar reformatted images are provided for review. Automated exposure control,  iterative reconstruction, and/or weight-based adjustment of the mA/kV was utilized to reduce the radiation dose to as low as reasonably achievable. COMPARISON: CT angiography chest 12:28.25 CLINICAL HISTORY: Rectal bleeding, tachycardia, chest pain, shortness of breath, fatigue. Rule out infection, diverticulitis, or other acute abnormality. FINDINGS: LOWER CHEST: No acute abnormality. LIVER: The liver is unremarkable. GALLBLADDER AND BILE DUCTS: Gallbladder is unremarkable. No biliary ductal dilatation. SPLEEN: No acute abnormality. PANCREAS: No acute abnormality. ADRENAL GLANDS: No acute abnormality. KIDNEYS, URETERS AND BLADDER: Fluid density lesions of the kidneys likely represent simple renal cysts. Simple renal cysts do not require additional follow-up unless clinically indicated due to signs/symptoms. No stones in the kidneys or ureters. No hydronephrosis. No perinephric or periureteral stranding. Urinary bladder is unremarkable. GI AND BOWEL: Stomach demonstrates no acute abnormality. No small or large bowel thickening or dilatation. The appendix is unremarkable. There is no bowel obstruction. PERITONEUM AND RETROPERITONEUM: No ascites. No free air. VASCULATURE: Moderate to severe atherosclerotic plaque of the aorta. LYMPH NODES: No lymphadenopathy. REPRODUCTIVE ORGANS: The prostate is unremarkable. BONES AND SOFT TISSUES: No acute osseous abnormality. No focal soft tissue abnormality. IMPRESSION: 1. No acute findings in the abdomen or pelvis. Electronically signed by: Morgane Naveau MD 06/30/2024 03:43 PM EST RP Workstation: [id number]   CT Angio Chest PE W and/or Wo Contrast Result Date: 06/30/2024 EXAM: CTA CHEST 06/30/2024 02:15:52 PM TECHNIQUE: CTA of the chest was performed after the administration of intravenous contrast. Multiplanar reformatted images are provided for review. MIP images are provided for review. Automated exposure control, iterative reconstruction, and/or weight based adjustment  of the mA/kV was utilized to reduce the radiation dose to as low as reasonably achievable. COMPARISON: ct angio chest 06/23/24 CLINICAL HISTORY: Pulmonary embolism (PE) suspected, high prob; Chest pain, shortness of breath, tachycardia, recent admission. Also recent heart failure and productive cough. Rule out pneumonia versus PE versus fluid overload versus other. Rectal bleeding, tachycardia, chest pain, shortness of breath, fatigue. Rule out infection, FINDINGS: PULMONARY ARTERIES: Pulmonary arteries are adequately opacified for evaluation. No acute pulmonary embolus. Main pulmonary artery is normal in caliber. MEDIASTINUM: The heart demonstrates 4-vessel coronary artery calcification and left ventricle enlargement. Moderate atherosclerotic plaque is present. There is no acute abnormality of the thoracic aorta. LYMPH NODES: No mediastinal, hilar or axillary lymphadenopathy. LUNGS AND PLEURA: Diffuse mild bronchial wall thickening. No focal consolidation or pulmonary edema. No evidence of pleural effusion or pneumothorax. UPPER ABDOMEN: A fluid lesion of the left kidney likely represents a simple renal cyst. Simple renal cysts do not require additional follow-up unless clinically indicated due to signs/symptoms. SOFT TISSUES AND BONES: No acute bone or soft tissue abnormality. IMPRESSION: 1. No pulmonary embolism. 2. Left ventricle enlargement and 4-vessel coronary artery calcification. Electronically signed by: Morgane Naveau MD 06/30/2024 03:41 PM EST RP Workstation: [id number]   DG Chest 2 View Result Date: 06/30/2024 CLINICAL DATA:  Chest pain. EXAM: CHEST - 2 VIEW COMPARISON:  June 23, 2024 FINDINGS: The heart size and mediastinal contours are within normal limits. No acute infiltrate, pleural effusion or pneumothorax is identified. The visualized skeletal structures are unremarkable. IMPRESSION: No active cardiopulmonary disease. Electronically Signed   By: Suzen Dials M.D.   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 Chest pain, atypical, could be noncardiac, could be polysubstance use disorder Chronic left bundle branch block. Elevated troponin from demand ischemia. Hypertension, hyperlipidemia, polysubstance use disorder tobacco use disorder, alcohol use disorder prior history of cocaine use, Homelessness  Plan  patient is admitted under medicine team cardiology is consulted. Resume patient's home medication of aspirin , hemoglobin is stable, bright red bleeding per rectum probably hemorrhoidal bleeding, if aspirin  needs to be held, okay to hold GDMT :metoprolol  XL, spironolactone , digoxin , Jardiance , Lasix  40 mg daily, statin and Entresto  24/26 mg twice daily Looks euvolemic on exam. Rectal bleeding and abdominal pain workup by primary team He has warm extremities, is not in congestive heart failure or shock.  No further recommendation from cardiology standpoint, this is chronic problem, no exacerbation, we will sign off  He needs close follow-up with advanced heart failure clinic, and optimize medications  Risk Assessment/Risk Scores:       [place]  Heart Association (NYHA) Functional Class NYHA Class III       For questions or updates, please contact [facility] Please consult [email or web address] for contact info under  Signed, Grayce Bold, MD  06/30/2024 6:45 PM     [1]  Allergies Allergen Reactions   Shrimp [Shellfish Allergy] Anaphylaxis and Itching    ALL SHELLFISH   Shellfish Allergy Itching   "

## 2024-07-01 ENCOUNTER — Other Ambulatory Visit (HOSPITAL_COMMUNITY): Payer: Self-pay

## 2024-07-01 ENCOUNTER — Encounter (HOSPITAL_COMMUNITY): Payer: Self-pay | Admitting: Internal Medicine

## 2024-07-01 ENCOUNTER — Other Ambulatory Visit: Payer: Self-pay

## 2024-07-01 DIAGNOSIS — I5023 Acute on chronic systolic (congestive) heart failure: Secondary | ICD-10-CM | POA: Diagnosis not present

## 2024-07-01 DIAGNOSIS — K625 Hemorrhage of anus and rectum: Secondary | ICD-10-CM | POA: Diagnosis not present

## 2024-07-01 DIAGNOSIS — R079 Chest pain, unspecified: Secondary | ICD-10-CM | POA: Diagnosis not present

## 2024-07-01 DIAGNOSIS — D125 Benign neoplasm of sigmoid colon: Secondary | ICD-10-CM | POA: Diagnosis not present

## 2024-07-01 DIAGNOSIS — R7989 Other specified abnormal findings of blood chemistry: Secondary | ICD-10-CM | POA: Diagnosis not present

## 2024-07-01 DIAGNOSIS — I10 Essential (primary) hypertension: Secondary | ICD-10-CM | POA: Diagnosis not present

## 2024-07-01 DIAGNOSIS — K640 First degree hemorrhoids: Secondary | ICD-10-CM | POA: Diagnosis not present

## 2024-07-01 LAB — URINALYSIS, W/ REFLEX TO CULTURE (INFECTION SUSPECTED)
Bacteria, UA: NONE SEEN
Bilirubin Urine: NEGATIVE
Glucose, UA: >=500 mg/dL — AB
Ketones, ur: NEGATIVE mg/dL
Leukocytes,Ua: NEGATIVE
Nitrite: NEGATIVE
Protein, ur: 30 mg/dL — AB
Specific Gravity, Urine: 1.021 (ref 1.005–1.030)
pH: 5 (ref 5.0–8.0)

## 2024-07-01 LAB — URINE DRUG SCREEN
Amphetamines: NEGATIVE
Barbiturates: NEGATIVE
Benzodiazepines: NEGATIVE
Cocaine: NEGATIVE
Fentanyl: NEGATIVE
Methadone Scn, Ur: NEGATIVE
Opiates: NEGATIVE
Tetrahydrocannabinol: NEGATIVE

## 2024-07-01 LAB — IRON AND TIBC
Iron: 88 ug/dL (ref 45–182)
Saturation Ratios: 24 % (ref 17.9–39.5)
TIBC: 365 ug/dL (ref 250–450)
UIBC: 278 ug/dL

## 2024-07-01 LAB — CBC
HCT: 41.6 % (ref 39.0–52.0)
Hemoglobin: 13.2 g/dL (ref 13.0–17.0)
MCH: 23.9 pg — ABNORMAL LOW (ref 26.0–34.0)
MCHC: 31.7 g/dL (ref 30.0–36.0)
MCV: 75.2 fL — ABNORMAL LOW (ref 80.0–100.0)
Platelets: 194 K/uL (ref 150–400)
RBC: 5.53 MIL/uL (ref 4.22–5.81)
RDW: 17 % — ABNORMAL HIGH (ref 11.5–15.5)
WBC: 10.9 K/uL — ABNORMAL HIGH (ref 4.0–10.5)
nRBC: 0 % (ref 0.0–0.2)

## 2024-07-01 LAB — HEMOGLOBIN AND HEMATOCRIT, BLOOD
HCT: 43.6 % (ref 39.0–52.0)
HCT: 43.3 % (ref 39.0–52.0)
Hemoglobin: 13.7 g/dL (ref 13.0–17.0)
Hemoglobin: 13.5 g/dL (ref 13.0–17.0)

## 2024-07-01 LAB — BASIC METABOLIC PANEL WITH GFR
Anion gap: 8 (ref 5–15)
BUN: 18 mg/dL (ref 6–20)
CO2: 27 mmol/L (ref 22–32)
Calcium: 8.7 mg/dL — ABNORMAL LOW (ref 8.9–10.3)
Chloride: 105 mmol/L (ref 98–111)
Creatinine, Ser: 1.56 mg/dL — ABNORMAL HIGH (ref 0.61–1.24)
GFR, Estimated: 51 mL/min — ABNORMAL LOW
Glucose, Bld: 105 mg/dL — ABNORMAL HIGH (ref 70–99)
Potassium: 4.7 mmol/L (ref 3.5–5.1)
Sodium: 140 mmol/L (ref 135–145)

## 2024-07-01 LAB — RETICULOCYTES
Immature Retic Fract: 11.6 % (ref 2.3–15.9)
RBC.: 5.7 MIL/uL (ref 4.22–5.81)
Retic Count, Absolute: 86.6 K/uL (ref 19.0–186.0)
Retic Ct Pct: 1.5 % (ref 0.4–3.1)

## 2024-07-01 LAB — FERRITIN: Ferritin: 218 ng/mL (ref 24–336)

## 2024-07-01 LAB — MISC LABCORP TEST (SEND OUT): Labcorp test code: 140140

## 2024-07-01 LAB — ABO/RH: ABO/RH(D): B POS

## 2024-07-01 MED ORDER — SODIUM CHLORIDE 0.9 % IV SOLN: INTRAVENOUS | Status: DC

## 2024-07-01 MED ORDER — SIMETHICONE 80 MG PO CHEW
240.0000 mg | CHEWABLE_TABLET | Freq: Once | ORAL | Status: AC
  Administered 2024-07-01: 240 mg via ORAL
  Filled 2024-07-01: qty 3

## 2024-07-01 MED ORDER — PEG 3350-KCL-NA BICARB-NACL 420 G PO SOLR
2000.0000 mL | Freq: Once | ORAL | Status: AC
  Administered 2024-07-01: 2000 mL via ORAL

## 2024-07-01 MED ORDER — PEG 3350-KCL-NA BICARB-NACL 420 G PO SOLR
2000.0000 mL | Freq: Once | ORAL | Status: AC
  Administered 2024-07-01: 2000 mL via ORAL
  Filled 2024-07-01: qty 4000

## 2024-07-01 MED ORDER — BISACODYL 5 MG PO TBEC
10.0000 mg | DELAYED_RELEASE_TABLET | Freq: Once | ORAL | Status: AC
  Administered 2024-07-01: 10 mg via ORAL
  Filled 2024-07-01: qty 2

## 2024-07-01 NOTE — Hospital Course (Signed)
"   58 y.o. male with past medical history  of  heart failure, CKD nonobstructive CAD, tobacco use, prior alcohol abuse, currently homeless comes for sob and rectal bleeding  "

## 2024-07-01 NOTE — Progress Notes (Signed)
.. ° ° °  PROCEDURAL EXPEDITER PROGRESS NOTE  Patient Name: Jesse Key  DOB:1966/05/05 Date of Admission: 06/30/2024  Date of Assessment:07/01/2024   -------------------------------------------------------------------------------------------------------------------   Brief clinical summary: Pt to Endo tomorrow for colonoscopy  Orders in place:  Yes   Labs, test, and orders reviewed: Y  Requires surgical clearance:  Yes  What type of clearance: Cardiology  Clearance received: Y  Barriers noted: N/A   -------------------------------------------------------------------------------------------------------------------  [facility], [place], [place] Please contact us  directly via secure chat (search for [facility]) or by calling us  at [phone] [facility]).

## 2024-07-01 NOTE — Plan of Care (Signed)

## 2024-07-01 NOTE — Progress Notes (Signed)
 Heart Failure Navigator Progress Note  Assessed for Heart & Vascular TOC clinic readiness.  Patient does not meet criteria due to he is an Advanced Heart Failure Team patient of Dr. Bensimhon. .   Navigator will sign off at this time.   Stephane Haddock, BSN, Scientist, clinical (histocompatibility and immunogenetics) Only

## 2024-07-01 NOTE — H&P (View-Only) (Signed)
 "   Consultation  Referring Provider: TRH/Patel Primary Care Physician:  Celestia Rosaline SQUIBB, NP Primary Gastroenterologist: Sampson  Reason for Consultation: Rectal bleeding, microcytic anemia  HPI: Jesse Key is a 58 y.o. 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  Patient with history of previous substance abuse and homelessness. Patient had just been discharged from the hospital on 06/28/2024 after hospitalization with complaint of shortness of breath secondary to CHF exacerbation. Presented back to the emergency room yesterday stating that he felt as if his heart was pounding fast and causing him to feel short of breath.  He had also had a bowel movement that was associated with mild lower abdominal discomfort and noticed that the commode was full of blood.  He says there was blood mixed in with the bowel movement best he could tell.  He had also had a similar episode the day previous though less amount.  He has not had any previous history of GI bleeding, denies any hemorrhoidal type symptoms.  He is not currently having any abdominal pain says he has some mild discomfort in the right lower quadrant area. No prior colonoscopy, no prior abdominal surgery. He is not currently on any anticoagulation.  No current aspirin , denies any BC's Goody's or NSAIDs.  Workup yesterday with CT of the abdomen pelvis with contrast shows moderate to severe atherosclerotic plaque otherwise negative exam. CT angio of the chest yesterday negative for PE and there is evidence of four-vessel coronary artery calcification.  Labs on arrival yesterday hemoglobin 14.8/hematocrit 46.8/MCV 76.1/WBC 11.5/platelets 210  Patient had hemoglobin of 14.9 on discharge from the hospital 06/28/2024.  INR 0.9 Respiratory panel still pending BNP 3740 Troponin 107> 87 Sodium  139/potassium 4.6/BUN 18/creatinine 1.28 Stool documented heme positive Drug screen negative  Labs today WBC 10.9/hemoglobin 13.2/hematocrit 41.6/MCV 75.2  Patient has not had any further bowel movements or bleeding since admission  Past Medical History:  Diagnosis Date   Asthma    Shellfish allergy     Past Surgical History:  Procedure Laterality Date   RIGHT/LEFT HEART CATH AND CORONARY ANGIOGRAPHY N/A 11/24/2023   Procedure: RIGHT/LEFT HEART CATH AND CORONARY ANGIOGRAPHY;  Surgeon: Cherrie Toribio SAUNDERS, MD;  Location: [facility];  Service: Cardiovascular;  Laterality: N/A;    Prior to Admission medications  Medication Sig Start Date End Date Taking? Authorizing Provider  albuterol  (VENTOLIN  HFA) 108 (90 Base) MCG/ACT inhaler Inhale 1-2 puffs into the lungs every 6 (six) hours as needed for wheezing or shortness of breath. 06/28/24  Yes Arrien, Elidia Toribio, MD  Digoxin  (LANOXIN ) 62.5 MCG TABS Take 1 tablet by mouth daily. 06/29/24  Yes Arrien, Elidia Toribio, MD  empagliflozin  (JARDIANCE ) 10 MG TABS tablet Take 1 tablet (10 mg total) by mouth daily before breakfast. 06/28/24  Yes Arrien, Elidia Toribio, MD  furosemide  (LASIX ) 40 MG tablet Take 1 tablet (40 mg total) by mouth daily. 06/28/24  Yes Arrien, Elidia Toribio, MD  rosuvastatin  (CRESTOR ) 20 MG tablet Take 1 tablet (20 mg total) by mouth daily. 06/28/24  Yes Arrien, Elidia Toribio, MD  sacubitril -valsartan  (ENTRESTO ) 24-26 MG Take 1 tablet by mouth 2 (two) times daily. 06/28/24  Yes Arrien, Elidia Toribio, MD    Current Facility-Administered Medications  Medication Dose Route Frequency Provider Last Rate Last Admin   0.9 %  sodium chloride  infusion  250 mL Intravenous PRN Patel, Ekta V, MD  acetaminophen  (TYLENOL ) tablet 650 mg  650 mg Oral Q6H PRN Patel, Ekta V, MD       Or   acetaminophen  (TYLENOL ) suppository 650 mg  650 mg Rectal Q6H PRN Patel, Ekta V, MD       albuterol  (PROVENTIL ) (2.5 MG/3ML)  0.083% nebulizer solution 2.5 mg  2.5 mg Inhalation Q6H PRN Tobie Mario GAILS, MD       digoxin  (LANOXIN ) tablet 62.5 mcg  62.5 mcg Oral Daily Patel, Ekta V, MD   62.5 mcg at 07/01/24 9065   folic acid  (FOLVITE ) tablet 1 mg  1 mg Oral Daily Tobie Mario GAILS, MD   1 mg at 07/01/24 9065   furosemide  (LASIX ) tablet 20 mg  20 mg Oral BID Patel, Ekta V, MD   20 mg at 07/01/24 0757   LORazepam  (ATIVAN ) tablet 1-4 mg  1-4 mg Oral Q1H PRN Patel, Ekta V, MD       Or   LORazepam  (ATIVAN ) injection 1-4 mg  1-4 mg Intravenous Q1H PRN Patel, Ekta V, MD       multivitamin with minerals tablet 1 tablet  1 tablet Oral Daily Patel, Ekta V, MD   1 tablet at 07/01/24 9065   nicotine  (NICODERM CQ  - dosed in mg/24 hours) patch 14 mg  14 mg Transdermal Daily Patel, Ekta V, MD   14 mg at 07/01/24 9066   ondansetron  (ZOFRAN ) tablet 4 mg  4 mg Oral Q6H PRN Patel, Ekta V, MD       Or   ondansetron  (ZOFRAN ) injection 4 mg  4 mg Intravenous Q6H PRN Patel, Ekta V, MD       pantoprazole  (PROTONIX ) injection 40 mg  40 mg Intravenous Q12H Patel, Ekta V, MD   40 mg at 07/01/24 0936   rosuvastatin  (CRESTOR ) tablet 20 mg  20 mg Oral Daily Tobie Mario GAILS, MD   20 mg at 07/01/24 0934   sodium chloride  flush (NS) 0.9 % injection 3 mL  3 mL Intravenous Q12H Tobie Mario GAILS, MD   3 mL at 07/01/24 9060   sodium chloride  flush (NS) 0.9 % injection 3 mL  3 mL Intravenous PRN Tobie Mario GAILS, MD   3 mL at 07/01/24 9063   thiamine  (VITAMIN B1) tablet 100 mg  100 mg Oral Daily Patel, Ekta V, MD   100 mg at 07/01/24 9065   Or   thiamine  (VITAMIN B1) injection 100 mg  100 mg Intravenous Daily Tobie Mario GAILS, MD       Current Outpatient Medications  Medication Sig Dispense Refill   albuterol  (VENTOLIN  HFA) 108 (90 Base) MCG/ACT inhaler Inhale 1-2 puffs into the lungs every 6 (six) hours as needed for wheezing or shortness of breath. 6.7 g 0   Digoxin  (LANOXIN ) 62.5 MCG TABS Take 1 tablet by mouth daily. 30 tablet 0   empagliflozin  (JARDIANCE ) 10 MG TABS  tablet Take 1 tablet (10 mg total) by mouth daily before breakfast. 30 tablet 0   furosemide  (LASIX ) 40 MG tablet Take 1 tablet (40 mg total) by mouth daily. 30 tablet 0   rosuvastatin  (CRESTOR ) 20 MG tablet Take 1 tablet (20 mg total) by mouth daily. 30 tablet 0   sacubitril -valsartan  (ENTRESTO ) 24-26 MG Take 1 tablet by mouth 2 (two) times daily. 60 tablet 0    Allergies as of 06/30/2024 - Review Complete 06/30/2024  Allergen Reaction Noted   Shrimp [shellfish allergy] Anaphylaxis and Itching 03/27/2013   Shellfish allergy Itching 11/23/2023    Family  History  Problem Relation Age of Onset   Diabetes Mother    Hypertension Mother    Diabetes Father    Hypertension Father     Social History   Socioeconomic History   Marital status: Single    Spouse name: Not on file   Number of children: Not on file   Years of education: Not on file   Highest education level: Not on file  Occupational History   Not on file  Tobacco Use   Smoking status: Some Days    Current packs/day: 0.25    Types: Cigarettes   Smokeless tobacco: Never  Vaping Use   Vaping status: Never Used  Substance and Sexual Activity   Alcohol use: Yes    Comment: weekends only   Drug use: No   Sexual activity: Not on file  Other Topics Concern   Not on file  Social History Narrative   ** Merged History Encounter **       Homeless at the time may smoke one cigarette a day   Social Drivers of Health   Tobacco Use: High Risk (06/23/2024)   Patient History    Smoking Tobacco Use: Some Days    Smokeless Tobacco Use: Never    Passive Exposure: Not on file  Financial Resource Strain: High Risk (04/12/2024)   Overall Financial Resource Strain (CARDIA)    Difficulty of Paying Living Expenses: Hard  Food Insecurity: Food Insecurity Present (06/24/2024)   Epic    Worried About Programme Researcher, Broadcasting/film/video in the Last Year: Sometimes true    Ran Out of Food in the Last Year: Sometimes true  Transportation Needs: No  Transportation Needs (06/24/2024)   Epic    Lack of Transportation (Medical): No    Lack of Transportation (Non-Medical): No  Recent Concern: Transportation Needs - Unmet Transportation Needs (06/04/2024)   Epic    Lack of Transportation (Medical): Yes    Lack of Transportation (Non-Medical): Yes  Physical Activity: Not on file  Stress: Stress Concern Present (06/04/2024)   [employer] of Occupational Health - Occupational Stress Questionnaire    Feeling of Stress: To some extent  Social Connections: Not on file  Intimate Partner Violence: Not At Risk (06/24/2024)   Epic    Fear of Current or Ex-Partner: No    Emotionally Abused: No    Physically Abused: No    Sexually Abused: No  Depression (PHQ2-9): Medium Risk (06/04/2024)   Depression (PHQ2-9)    PHQ-2 Score: 9  Alcohol Screen: Not on file  Housing: High Risk (06/24/2024)   Epic    Unable to Pay for Housing in the Last Year: Yes    Number of Times Moved in the Last Year: 1    Homeless in the Last Year: Yes  Utilities: At Risk (06/24/2024)   Epic    Threatened with loss of utilities: Yes  Health Literacy: Adequate Health Literacy (01/03/2024)   B1300 Health Literacy    Frequency of need for help with medical instructions: Rarely    Review of Systems: Pertinent positive and negative review of systems were noted in the above HPI section.  All other review of systems was otherwise negative.  Physical Exam: Vital signs in last 24 hours: Temp:  [98 F (36.7 C)-98.8 F (37.1 C)] 98.2 F (36.8 C) (12/29 0943) Pulse Rate:  [83-103] 96 (12/29 0939) Resp:  [16-23] 23 (12/29 0930) BP: (97-129)/(65-89) 102/89 (12/29 0939) SpO2:  [98 %-100 %] 100 % (12/29 0930) Last BM Date :  06/30/24 General:   Alert,  Well-developed, well-nourished, pleasant and cooperative in NAD Head:  Normocephalic and atraumatic. Eyes:  Sclera clear, no icterus.   Conjunctiva pink. Ears:  Normal auditory acuity. Nose:  No deformity, discharge,  or  lesions. Mouth:  No deformity or lesions.   Neck:  Supple; no masses or thyromegaly.  No JVD Lungs:  Clear throughout to auscultation.   Heart:  Regular rate and rhythm; no murmurs, clicks, rubs,  or gallops. Abdomen:  Soft, minimal tenderness in the right lower quadrant/suprapubic area no guarding or rebound, no palpable mass or hepatosplenomegaly Rectal:  Deferred -documented heme positive on admit Msk:  Symmetrical without gross deformities. . Pulses:  Normal pulses noted. Extremities:  Without clubbing or edema. Neurologic:  Alert and  oriented x4;  grossly normal neurologically. Skin:  Intact without significant lesions or rashes.. Psych:  Alert and cooperative. Normal mood and affect.  Intake/Output from previous day: 12/28 0701 - 12/29 0700 In: 120 [P.O.:120] Out: -  Intake/Output this shift: No intake/output data recorded.  Lab Results: Recent Labs    06/30/24 0922 07/01/24 0157  WBC 11.5* 10.9*  HGB 14.8 13.2  HCT 46.8 41.6  PLT 210 194   BMET Recent Labs    06/30/24 0922 06/30/24 2050 07/01/24 0157  NA 137 139 140  K 5.2* 4.6 4.7  CL 102 107 105  CO2 25 23 27   GLUCOSE 145* 134* 105*  BUN 23* 18 18  CREATININE 1.25* 1.28* 1.56*  CALCIUM  9.4 8.7* 8.7*   LFT Recent Labs    06/30/24 0922  PROT 7.3  ALBUMIN 4.1  AST 26  ALT 22  ALKPHOS 84  BILITOT 0.3   PT/INR Recent Labs    06/30/24 0922  LABPROT 12.4  INR 0.9      IMPRESSION:  #83 58 year old African-American male presenting with new onset rectal bleeding with 2 episodes of grossly bloody stool prior to admission.  He has not had any further bowel movements or bleeding since arrival to the ER CT of the abdomen pelvis with contrast does not show any bowel abnormality. Hemoglobin has been relatively stable-14.8 on arrival, 13.2 today  He is microcytic, will rule out iron  deficiency  Etiology of bleeding is not clear, he has not had any prior colonoscopy or episodes of rectal bleeding.   Consider low-grade ischemic colitis given his cardiomyopathy, rule out occult colonic lesion or other mucosal lesion, possible bleeding secondary to internal hemorrhoids,  #2 severe nonischemic cardiomyopathy with EF less than 20% #3 nonobstructive coronary artery disease four-vessel #4 previous history of substance abuse #5 homelessness  #6 shortness of breath,-patient felt to be euvolemic per cardiology.  No current complaint of shortness of breath today  Plan; clear liquid diet, n.p.o. past midnight Will plan for bowel prep this evening with Nulytely, and colonoscopy with Dr. Nikki for tomorrow 07/02/2024.  Procedure was discussed in detail with the patient including indications risks and benefits.  We specifically discussed increased risk of complications with anesthesia given his severe cardiomyopathy.  He is agreeable to proceed.  Serial hemoglobins Iron  studies GI will follow with you   Amy Esterwood PA-C 07/01/2024, 10:26 AM  I have taken an interval history, thoroughly reviewed the chart and examined the patient. I agree with the Advanced Practitioner's note, impression and recommendations, and have recorded additional findings, impressions and recommendations below. I performed a substantive portion of this encounter (>50% time spent), including a complete performance of the medical decision making.  My additional thoughts  are as follows:  2 days of intermittent abdominal cramps followed by hematochezia with normal hemoglobin in the setting of severe CHF.  Volume status improved from most recent admission though still with elevated BNP.  Cardiology has evaluated him and feels that his cardiovascular status is generally stable.  BP a little lower than usual and creatinine up some, this is being watched closely.  Overall clinical picture suggest lower GI source of bleeding, though stability of hemoglobin on second check is reassuring.  Iron  studies normal despite  microcytosis.  Possibly hemorrhoidal bleeding, though patient does not report chronic constipation.  He had some cramps as well so we must consider the possibility of low-grade ischemic colitis even if not evident on the admission CT abdomen and pelvis (images of which were personally reviewed).  Plan is for colonoscopy tomorrow after bowel prep this evening/tomorrow morning.  He was agreeable after thorough discussion of procedure and risks.  The benefits and risks of the planned procedure(s) were described in detail with the patient or (when appropriate) their health care proxy.  Risks were outlined as including, but not limited to, bleeding, infection, perforation, adverse medication reaction leading to cardiac or pulmonary decompensation, pancreatitis (if ERCP).  The limitation of incomplete mucosal visualization was also discussed.  No guarantees or warranties were given.  Patient at high risk for cardiopulmonary complications of procedure due to medical comorbidities.  (LVEF less than 20%, recent exacerbation of this with volume overload)  Check CBC tomorrow morning _________________  This consultation required a high degree of medical decision making due to the nature and complexity of the acute condition(s) being evaluated as well as the patient's medical comorbidities.  [name] [phone]     "

## 2024-07-01 NOTE — Consult Note (Addendum)
 "   Consultation  Referring Provider: TRH/Patel Primary Care Physician:  Celestia Rosaline SQUIBB, NP Primary Gastroenterologist: Sampson  Reason for Consultation: Rectal bleeding, microcytic anemia  HPI: Jesse Key is a 58 y.o. 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  Patient with history of previous substance abuse and homelessness. Patient had just been discharged from the hospital on 06/28/2024 after hospitalization with complaint of shortness of breath secondary to CHF exacerbation. Presented back to the emergency room yesterday stating that he felt as if his heart was pounding fast and causing him to feel short of breath.  He had also had a bowel movement that was associated with mild lower abdominal discomfort and noticed that the commode was full of blood.  He says there was blood mixed in with the bowel movement best he could tell.  He had also had a similar episode the day previous though less amount.  He has not had any previous history of GI bleeding, denies any hemorrhoidal type symptoms.  He is not currently having any abdominal pain says he has some mild discomfort in the right lower quadrant area. No prior colonoscopy, no prior abdominal surgery. He is not currently on any anticoagulation.  No current aspirin , denies any BC's Goody's or NSAIDs.  Workup yesterday with CT of the abdomen pelvis with contrast shows moderate to severe atherosclerotic plaque otherwise negative exam. CT angio of the chest yesterday negative for PE and there is evidence of four-vessel coronary artery calcification.  Labs on arrival yesterday hemoglobin 14.8/hematocrit 46.8/MCV 76.1/WBC 11.5/platelets 210  Patient had hemoglobin of 14.9 on discharge from the hospital 06/28/2024.  INR 0.9 Respiratory panel still pending BNP 3740 Troponin 107> 87 Sodium  139/potassium 4.6/BUN 18/creatinine 1.28 Stool documented heme positive Drug screen negative  Labs today WBC 10.9/hemoglobin 13.2/hematocrit 41.6/MCV 75.2  Patient has not had any further bowel movements or bleeding since admission  Past Medical History:  Diagnosis Date   Asthma    Shellfish allergy     Past Surgical History:  Procedure Laterality Date   RIGHT/LEFT HEART CATH AND CORONARY ANGIOGRAPHY N/A 11/24/2023   Procedure: RIGHT/LEFT HEART CATH AND CORONARY ANGIOGRAPHY;  Surgeon: Cherrie Toribio SAUNDERS, MD;  Location: [facility];  Service: Cardiovascular;  Laterality: N/A;    Prior to Admission medications  Medication Sig Start Date End Date Taking? Authorizing Provider  albuterol  (VENTOLIN  HFA) 108 (90 Base) MCG/ACT inhaler Inhale 1-2 puffs into the lungs every 6 (six) hours as needed for wheezing or shortness of breath. 06/28/24  Yes Arrien, Elidia Toribio, MD  Digoxin  (LANOXIN ) 62.5 MCG TABS Take 1 tablet by mouth daily. 06/29/24  Yes Arrien, Elidia Toribio, MD  empagliflozin  (JARDIANCE ) 10 MG TABS tablet Take 1 tablet (10 mg total) by mouth daily before breakfast. 06/28/24  Yes Arrien, Elidia Toribio, MD  furosemide  (LASIX ) 40 MG tablet Take 1 tablet (40 mg total) by mouth daily. 06/28/24  Yes Arrien, Elidia Toribio, MD  rosuvastatin  (CRESTOR ) 20 MG tablet Take 1 tablet (20 mg total) by mouth daily. 06/28/24  Yes Arrien, Elidia Toribio, MD  sacubitril -valsartan  (ENTRESTO ) 24-26 MG Take 1 tablet by mouth 2 (two) times daily. 06/28/24  Yes Arrien, Elidia Toribio, MD    Current Facility-Administered Medications  Medication Dose Route Frequency Provider Last Rate Last Admin   0.9 %  sodium chloride  infusion  250 mL Intravenous PRN Patel, Ekta V, MD  acetaminophen  (TYLENOL ) tablet 650 mg  650 mg Oral Q6H PRN Patel, Ekta V, MD       Or   acetaminophen  (TYLENOL ) suppository 650 mg  650 mg Rectal Q6H PRN Patel, Ekta V, MD       albuterol  (PROVENTIL ) (2.5 MG/3ML)  0.083% nebulizer solution 2.5 mg  2.5 mg Inhalation Q6H PRN Tobie Mario GAILS, MD       digoxin  (LANOXIN ) tablet 62.5 mcg  62.5 mcg Oral Daily Patel, Ekta V, MD   62.5 mcg at 07/01/24 9065   folic acid  (FOLVITE ) tablet 1 mg  1 mg Oral Daily Tobie Mario GAILS, MD   1 mg at 07/01/24 9065   furosemide  (LASIX ) tablet 20 mg  20 mg Oral BID Patel, Ekta V, MD   20 mg at 07/01/24 0757   LORazepam  (ATIVAN ) tablet 1-4 mg  1-4 mg Oral Q1H PRN Patel, Ekta V, MD       Or   LORazepam  (ATIVAN ) injection 1-4 mg  1-4 mg Intravenous Q1H PRN Patel, Ekta V, MD       multivitamin with minerals tablet 1 tablet  1 tablet Oral Daily Patel, Ekta V, MD   1 tablet at 07/01/24 9065   nicotine  (NICODERM CQ  - dosed in mg/24 hours) patch 14 mg  14 mg Transdermal Daily Patel, Ekta V, MD   14 mg at 07/01/24 9066   ondansetron  (ZOFRAN ) tablet 4 mg  4 mg Oral Q6H PRN Patel, Ekta V, MD       Or   ondansetron  (ZOFRAN ) injection 4 mg  4 mg Intravenous Q6H PRN Patel, Ekta V, MD       pantoprazole  (PROTONIX ) injection 40 mg  40 mg Intravenous Q12H Patel, Ekta V, MD   40 mg at 07/01/24 0936   rosuvastatin  (CRESTOR ) tablet 20 mg  20 mg Oral Daily Tobie Mario GAILS, MD   20 mg at 07/01/24 0934   sodium chloride  flush (NS) 0.9 % injection 3 mL  3 mL Intravenous Q12H Tobie Mario GAILS, MD   3 mL at 07/01/24 9060   sodium chloride  flush (NS) 0.9 % injection 3 mL  3 mL Intravenous PRN Tobie Mario GAILS, MD   3 mL at 07/01/24 9063   thiamine  (VITAMIN B1) tablet 100 mg  100 mg Oral Daily Patel, Ekta V, MD   100 mg at 07/01/24 9065   Or   thiamine  (VITAMIN B1) injection 100 mg  100 mg Intravenous Daily Tobie Mario GAILS, MD       Current Outpatient Medications  Medication Sig Dispense Refill   albuterol  (VENTOLIN  HFA) 108 (90 Base) MCG/ACT inhaler Inhale 1-2 puffs into the lungs every 6 (six) hours as needed for wheezing or shortness of breath. 6.7 g 0   Digoxin  (LANOXIN ) 62.5 MCG TABS Take 1 tablet by mouth daily. 30 tablet 0   empagliflozin  (JARDIANCE ) 10 MG TABS  tablet Take 1 tablet (10 mg total) by mouth daily before breakfast. 30 tablet 0   furosemide  (LASIX ) 40 MG tablet Take 1 tablet (40 mg total) by mouth daily. 30 tablet 0   rosuvastatin  (CRESTOR ) 20 MG tablet Take 1 tablet (20 mg total) by mouth daily. 30 tablet 0   sacubitril -valsartan  (ENTRESTO ) 24-26 MG Take 1 tablet by mouth 2 (two) times daily. 60 tablet 0    Allergies as of 06/30/2024 - Review Complete 06/30/2024  Allergen Reaction Noted   Shrimp [shellfish allergy] Anaphylaxis and Itching 03/27/2013   Shellfish allergy Itching 11/23/2023    Family  History  Problem Relation Age of Onset   Diabetes Mother    Hypertension Mother    Diabetes Father    Hypertension Father     Social History   Socioeconomic History   Marital status: Single    Spouse name: Not on file   Number of children: Not on file   Years of education: Not on file   Highest education level: Not on file  Occupational History   Not on file  Tobacco Use   Smoking status: Some Days    Current packs/day: 0.25    Types: Cigarettes   Smokeless tobacco: Never  Vaping Use   Vaping status: Never Used  Substance and Sexual Activity   Alcohol use: Yes    Comment: weekends only   Drug use: No   Sexual activity: Not on file  Other Topics Concern   Not on file  Social History Narrative   ** Merged History Encounter **       Homeless at the time may smoke one cigarette a day   Social Drivers of Health   Tobacco Use: High Risk (06/23/2024)   Patient History    Smoking Tobacco Use: Some Days    Smokeless Tobacco Use: Never    Passive Exposure: Not on file  Financial Resource Strain: High Risk (04/12/2024)   Overall Financial Resource Strain (CARDIA)    Difficulty of Paying Living Expenses: Hard  Food Insecurity: Food Insecurity Present (06/24/2024)   Epic    Worried About Programme Researcher, Broadcasting/film/video in the Last Year: Sometimes true    Ran Out of Food in the Last Year: Sometimes true  Transportation Needs: No  Transportation Needs (06/24/2024)   Epic    Lack of Transportation (Medical): No    Lack of Transportation (Non-Medical): No  Recent Concern: Transportation Needs - Unmet Transportation Needs (06/04/2024)   Epic    Lack of Transportation (Medical): Yes    Lack of Transportation (Non-Medical): Yes  Physical Activity: Not on file  Stress: Stress Concern Present (06/04/2024)   [employer] of Occupational Health - Occupational Stress Questionnaire    Feeling of Stress: To some extent  Social Connections: Not on file  Intimate Partner Violence: Not At Risk (06/24/2024)   Epic    Fear of Current or Ex-Partner: No    Emotionally Abused: No    Physically Abused: No    Sexually Abused: No  Depression (PHQ2-9): Medium Risk (06/04/2024)   Depression (PHQ2-9)    PHQ-2 Score: 9  Alcohol Screen: Not on file  Housing: High Risk (06/24/2024)   Epic    Unable to Pay for Housing in the Last Year: Yes    Number of Times Moved in the Last Year: 1    Homeless in the Last Year: Yes  Utilities: At Risk (06/24/2024)   Epic    Threatened with loss of utilities: Yes  Health Literacy: Adequate Health Literacy (01/03/2024)   B1300 Health Literacy    Frequency of need for help with medical instructions: Rarely    Review of Systems: Pertinent positive and negative review of systems were noted in the above HPI section.  All other review of systems was otherwise negative.  Physical Exam: Vital signs in last 24 hours: Temp:  [98 F (36.7 C)-98.8 F (37.1 C)] 98.2 F (36.8 C) (12/29 0943) Pulse Rate:  [83-103] 96 (12/29 0939) Resp:  [16-23] 23 (12/29 0930) BP: (97-129)/(65-89) 102/89 (12/29 0939) SpO2:  [98 %-100 %] 100 % (12/29 0930) Last BM Date :  06/30/24 General:   Alert,  Well-developed, well-nourished, pleasant and cooperative in NAD Head:  Normocephalic and atraumatic. Eyes:  Sclera clear, no icterus.   Conjunctiva pink. Ears:  Normal auditory acuity. Nose:  No deformity, discharge,  or  lesions. Mouth:  No deformity or lesions.   Neck:  Supple; no masses or thyromegaly.  No JVD Lungs:  Clear throughout to auscultation.   Heart:  Regular rate and rhythm; no murmurs, clicks, rubs,  or gallops. Abdomen:  Soft, minimal tenderness in the right lower quadrant/suprapubic area no guarding or rebound, no palpable mass or hepatosplenomegaly Rectal:  Deferred -documented heme positive on admit Msk:  Symmetrical without gross deformities. . Pulses:  Normal pulses noted. Extremities:  Without clubbing or edema. Neurologic:  Alert and  oriented x4;  grossly normal neurologically. Skin:  Intact without significant lesions or rashes.. Psych:  Alert and cooperative. Normal mood and affect.  Intake/Output from previous day: 12/28 0701 - 12/29 0700 In: 120 [P.O.:120] Out: -  Intake/Output this shift: No intake/output data recorded.  Lab Results: Recent Labs    06/30/24 0922 07/01/24 0157  WBC 11.5* 10.9*  HGB 14.8 13.2  HCT 46.8 41.6  PLT 210 194   BMET Recent Labs    06/30/24 0922 06/30/24 2050 07/01/24 0157  NA 137 139 140  K 5.2* 4.6 4.7  CL 102 107 105  CO2 25 23 27   GLUCOSE 145* 134* 105*  BUN 23* 18 18  CREATININE 1.25* 1.28* 1.56*  CALCIUM  9.4 8.7* 8.7*   LFT Recent Labs    06/30/24 0922  PROT 7.3  ALBUMIN 4.1  AST 26  ALT 22  ALKPHOS 84  BILITOT 0.3   PT/INR Recent Labs    06/30/24 0922  LABPROT 12.4  INR 0.9      IMPRESSION:  #83 58 year old African-American male presenting with new onset rectal bleeding with 2 episodes of grossly bloody stool prior to admission.  He has not had any further bowel movements or bleeding since arrival to the ER CT of the abdomen pelvis with contrast does not show any bowel abnormality. Hemoglobin has been relatively stable-14.8 on arrival, 13.2 today  He is microcytic, will rule out iron  deficiency  Etiology of bleeding is not clear, he has not had any prior colonoscopy or episodes of rectal bleeding.   Consider low-grade ischemic colitis given his cardiomyopathy, rule out occult colonic lesion or other mucosal lesion, possible bleeding secondary to internal hemorrhoids,  #2 severe nonischemic cardiomyopathy with EF less than 20% #3 nonobstructive coronary artery disease four-vessel #4 previous history of substance abuse #5 homelessness  #6 shortness of breath,-patient felt to be euvolemic per cardiology.  No current complaint of shortness of breath today  Plan; clear liquid diet, n.p.o. past midnight Will plan for bowel prep this evening with Nulytely, and colonoscopy with Dr. Nikki for tomorrow 07/02/2024.  Procedure was discussed in detail with the patient including indications risks and benefits.  We specifically discussed increased risk of complications with anesthesia given his severe cardiomyopathy.  He is agreeable to proceed.  Serial hemoglobins Iron  studies GI will follow with you   Amy Esterwood PA-C 07/01/2024, 10:26 AM  I have taken an interval history, thoroughly reviewed the chart and examined the patient. I agree with the Advanced Practitioner's note, impression and recommendations, and have recorded additional findings, impressions and recommendations below. I performed a substantive portion of this encounter (>50% time spent), including a complete performance of the medical decision making.  My additional thoughts  are as follows:  2 days of intermittent abdominal cramps followed by hematochezia with normal hemoglobin in the setting of severe CHF.  Volume status improved from most recent admission though still with elevated BNP.  Cardiology has evaluated him and feels that his cardiovascular status is generally stable.  BP a little lower than usual and creatinine up some, this is being watched closely.  Overall clinical picture suggest lower GI source of bleeding, though stability of hemoglobin on second check is reassuring.  Iron  studies normal despite  microcytosis.  Possibly hemorrhoidal bleeding, though patient does not report chronic constipation.  He had some cramps as well so we must consider the possibility of low-grade ischemic colitis even if not evident on the admission CT abdomen and pelvis (images of which were personally reviewed).  Plan is for colonoscopy tomorrow after bowel prep this evening/tomorrow morning.  He was agreeable after thorough discussion of procedure and risks.  The benefits and risks of the planned procedure(s) were described in detail with the patient or (when appropriate) their health care proxy.  Risks were outlined as including, but not limited to, bleeding, infection, perforation, adverse medication reaction leading to cardiac or pulmonary decompensation, pancreatitis (if ERCP).  The limitation of incomplete mucosal visualization was also discussed.  No guarantees or warranties were given.  Patient at high risk for cardiopulmonary complications of procedure due to medical comorbidities.  (LVEF less than 20%, recent exacerbation of this with volume overload)  Check CBC tomorrow morning _________________  This consultation required a high degree of medical decision making due to the nature and complexity of the acute condition(s) being evaluated as well as the patient's medical comorbidities.  [name] [phone]     "

## 2024-07-01 NOTE — Progress Notes (Signed)
 "   Progress Note  Patient Name: Jesse Key Date of Encounter: 07/01/2024  Primary Cardiologist:   Soyla DELENA Merck, MD   Subjective   No pain.  Chronic SOB without acute distress.  No BM since admission.   Inpatient Medications    Scheduled Meds:  digoxin   62.5 mcg Oral Daily   folic acid   1 mg Oral Daily   furosemide   20 mg Oral BID   multivitamin with minerals  1 tablet Oral Daily   nicotine   14 mg Transdermal Daily   pantoprazole  (PROTONIX ) IV  40 mg Intravenous Q12H   rosuvastatin   20 mg Oral Daily   sodium chloride  flush  3 mL Intravenous Q12H   thiamine   100 mg Oral Daily   Or   thiamine   100 mg Intravenous Daily   Continuous Infusions:  sodium chloride      PRN Meds: sodium chloride , acetaminophen  **OR** acetaminophen , albuterol , LORazepam  **OR** LORazepam , ondansetron  **OR** ondansetron  (ZOFRAN ) IV, sodium chloride  flush   Vital Signs    Vitals:   07/01/24 0545 07/01/24 0610 07/01/24 0715 07/01/24 0800  BP: 99/67  109/73 129/79  Pulse: (!) 101  95 (!) 101  Resp: (!) 23  (!) 23 19  Temp:  98.8 F (37.1 C)    TempSrc:  Oral    SpO2: 100%  98% 100%  Weight:      Height:        Intake/Output Summary (Last 24 hours) at 07/01/2024 0849 Last data filed at 06/30/2024 1839 Gross per 24 hour  Intake 120 ml  Output --  Net 120 ml   Filed Weights   06/30/24 0914  Weight: 61.2 kg    Telemetry    NSR - Personally Reviewed  ECG    NA - Personally Reviewed  Physical Exam   GEN: No acute distress.   Neck: No  JVD Cardiac: RRR, no murmurs, rubs, or gallops.  Respiratory: Clear  to auscultation bilaterally. GI: Soft, nontender, non-distended  MS: No  edema; No deformity. Neuro:  Nonfocal  Psych: Normal affect   Labs    Chemistry Recent Labs  Lab 06/25/24 1329 06/26/24 0315 06/27/24 0505 06/30/24 0922 06/30/24 2050 07/01/24 0157  NA  --  139   < > 137 139 140  K  --  4.8   < > 5.2* 4.6 4.7  CL  --  103   < > 102 107 105  CO2   --  26   < > 25 23 27   GLUCOSE  --  118*   < > 145* 134* 105*  BUN  --  42*   < > 23* 18 18  CREATININE  --  1.61*   < > 1.25* 1.28* 1.56*  CALCIUM   --  9.4   < > 9.4 8.7* 8.7*  PROT 7.2 7.0  --  7.3  --   --   ALBUMIN 3.7 3.6  --  4.1  --   --   AST 43* 30  --  26  --   --   ALT 30 27  --  22  --   --   ALKPHOS 100 86  --  84  --   --   BILITOT 0.5 0.4  --  0.3  --   --   GFRNONAA  --  49*   < > >60 >60 51*  ANIONGAP  --  10   < > 10 9 8    < > = values in this  interval not displayed.     Hematology Recent Labs  Lab 06/28/24 0500 06/30/24 0922 07/01/24 0157  WBC 10.2 11.5* 10.9*  RBC 6.36* 6.15* 5.53  HGB 14.9 14.8 13.2  HCT 47.5 46.8 41.6  MCV 74.7* 76.1* 75.2*  MCH 23.4* 24.1* 23.9*  MCHC 31.4 31.6 31.7  RDW 18.7* 18.7* 17.0*  PLT 216 210 194    Cardiac EnzymesNo results for input(s): TROPONINI in the last 168 hours. No results for input(s): TROPIPOC in the last 168 hours.   BNP Recent Labs  Lab 06/30/24 1232  PROBNP 3,740.0*     DDimer No results for input(s): DDIMER in the last 168 hours.   Radiology    CT ABDOMEN PELVIS W CONTRAST Result Date: 06/30/2024 EXAM: CT ABDOMEN AND PELVIS WITH CONTRAST 06/30/2024 02:15:52 PM TECHNIQUE: CT of the abdomen and pelvis was performed with the administration of 75 mL of iohexol  (OMNIPAQUE ) 350 MG/ML injection. Multiplanar reformatted images are provided for review. Automated exposure control, iterative reconstruction, and/or weight-based adjustment of the mA/kV was utilized to reduce the radiation dose to as low as reasonably achievable. COMPARISON: CT angiography chest 12:28.25 CLINICAL HISTORY: Rectal bleeding, tachycardia, chest pain, shortness of breath, fatigue. Rule out infection, diverticulitis, or other acute abnormality. FINDINGS: LOWER CHEST: No acute abnormality. LIVER: The liver is unremarkable. GALLBLADDER AND BILE DUCTS: Gallbladder is unremarkable. No biliary ductal dilatation. SPLEEN: No acute abnormality.  PANCREAS: No acute abnormality. ADRENAL GLANDS: No acute abnormality. KIDNEYS, URETERS AND BLADDER: Fluid density lesions of the kidneys likely represent simple renal cysts. Simple renal cysts do not require additional follow-up unless clinically indicated due to signs/symptoms. No stones in the kidneys or ureters. No hydronephrosis. No perinephric or periureteral stranding. Urinary bladder is unremarkable. GI AND BOWEL: Stomach demonstrates no acute abnormality. No small or large bowel thickening or dilatation. The appendix is unremarkable. There is no bowel obstruction. PERITONEUM AND RETROPERITONEUM: No ascites. No free air. VASCULATURE: Moderate to severe atherosclerotic plaque of the aorta. LYMPH NODES: No lymphadenopathy. REPRODUCTIVE ORGANS: The prostate is unremarkable. BONES AND SOFT TISSUES: No acute osseous abnormality. No focal soft tissue abnormality. IMPRESSION: 1. No acute findings in the abdomen or pelvis. Electronically signed by: Morgane Naveau MD 06/30/2024 03:43 PM EST RP Workstation: [id number]   CT Angio Chest PE W and/or Wo Contrast Result Date: 06/30/2024 EXAM: CTA CHEST 06/30/2024 02:15:52 PM TECHNIQUE: CTA of the chest was performed after the administration of intravenous contrast. Multiplanar reformatted images are provided for review. MIP images are provided for review. Automated exposure control, iterative reconstruction, and/or weight based adjustment of the mA/kV was utilized to reduce the radiation dose to as low as reasonably achievable. COMPARISON: ct angio chest 06/23/24 CLINICAL HISTORY: Pulmonary embolism (PE) suspected, high prob; Chest pain, shortness of breath, tachycardia, recent admission. Also recent heart failure and productive cough. Rule out pneumonia versus PE versus fluid overload versus other. Rectal bleeding, tachycardia, chest pain, shortness of breath, fatigue. Rule out infection, FINDINGS: PULMONARY ARTERIES: Pulmonary arteries are adequately opacified for  evaluation. No acute pulmonary embolus. Main pulmonary artery is normal in caliber. MEDIASTINUM: The heart demonstrates 4-vessel coronary artery calcification and left ventricle enlargement. Moderate atherosclerotic plaque is present. There is no acute abnormality of the thoracic aorta. LYMPH NODES: No mediastinal, hilar or axillary lymphadenopathy. LUNGS AND PLEURA: Diffuse mild bronchial wall thickening. No focal consolidation or pulmonary edema. No evidence of pleural effusion or pneumothorax. UPPER ABDOMEN: A fluid lesion of the left kidney likely represents a simple renal cyst. Simple renal cysts  do not require additional follow-up unless clinically indicated due to signs/symptoms. SOFT TISSUES AND BONES: No acute bone or soft tissue abnormality. IMPRESSION: 1. No pulmonary embolism. 2. Left ventricle enlargement and 4-vessel coronary artery calcification. Electronically signed by: Morgane Naveau MD 06/30/2024 03:41 PM EST RP Workstation: [id number]   DG Chest 2 View Result Date: 06/30/2024 CLINICAL DATA:  Chest pain. EXAM: CHEST - 2 VIEW COMPARISON:  June 23, 2024 FINDINGS: The heart size and mediastinal contours are within normal limits. No acute infiltrate, pleural effusion or pneumothorax is identified. The visualized skeletal structures are unremarkable. IMPRESSION: No active cardiopulmonary disease. Electronically Signed   By: Suzen Dials M.D.   On: 06/30/2024 11:52    Cardiac Studies   NA  Patient Profile     58 y.o. 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   Assessment & Plan    Acute on chronic systolic HF with reduced EF :   Euvolemic on presentation.   Rectal bleeding. Per primary team.  Question hemorrhoidal.  Hgb stable.  OK to hold ASA if he has continued rectal bleeing.    HTN:    BP is actually low.  Creat  is us  slightly.  Hold off on Farxiga  and Entresto  pending follow up creat.   I would like to restart both before discharge pending his BP and creat.   Elevated troponin:  Trop non diagnostic without a supporting clinical picture of active ischemia.  Has trended down from previous draws earlier this month.   No further ischemia work up.    For questions or updates, please contact [facility] Please consult [email or web address] for contact info under Cardiology/STEMI.   Signed, Lynwood Schilling, MD  07/01/2024, 8:49 AM    "
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
## 2024-07-01 NOTE — Progress Notes (Signed)
" °  Progress Note   Patient: Jesse Key FMW:[id number] DOB: Apr 29, 1966 DOA: 06/30/2024     0 DOS: the patient was seen and examined on 07/01/2024   Brief hospital course:  58 y.o. male with past medical history  of  heart failure, CKD nonobstructive CAD, tobacco use, prior alcohol abuse, currently homeless comes for sob and rectal bleeding   Assessment and Plan: BRBPR -Hgb stable currently  -Pt is colonoscopy naive -GI consulted. Plan for colonoscopy 12/30 -recheck cbc in AM  Chronic CHF, not in exacerbation -seen by Cardiology -euvolemic. Per Cardiology, OK to hold ASA if pt has continued rectal bleeding -No further ischemic workup.  -Holding off Farxiga  and Entresto  for now, although pt would like to restart both before discharge if BP and Cr allows  HTN -bp stable and controlled  COPD -on minimal O2 support  CKD 3a -Cr seems stable -Recheck bmet in AM  Hx cocaine abuse -UDS neg for cocaine, was last pos on 12/24  ETOH abuse -needs to abstain from ETOH     Subjective: Denies abd pain, chest pain or sob  Physical Exam: Vitals:   07/01/24 1145 07/01/24 1230 07/01/24 1400 07/01/24 1509  BP: 110/73 111/73 114/73 111/71  Pulse: 96 91 91 92  Resp: 18 20 20 20   Temp:    98.6 F (37 C)  TempSrc:    Oral  SpO2: 100% 100% 100% 99%  Weight:      Height:       General exam: Awake, laying in bed, in nad Respiratory system: Normal respiratory effort, no wheezing Cardiovascular system: regular rate, s1, s2 Gastrointestinal system: Soft, nondistended, positive BS Central nervous system: CN2-12 grossly intact, strength intact Extremities: Perfused, no clubbing Skin: Normal skin turgor, no notable skin lesions seen Psychiatry: Mood normal // affect seems normal  Data Reviewed:  Labs reviewed: Na 140, K 4.7, Cr 1.56, Hgb 13.7   Family Communication: Pt in room, family not at bedside  Disposition: Status is: Observation The patient remains OBS appropriate and  will d/c before 2 midnights.  Planned Discharge Destination: Home    Author: Garnette Pelt, MD 07/01/2024 6:13 PM  For on call review [email or web address].  "

## 2024-07-02 ENCOUNTER — Encounter (HOSPITAL_COMMUNITY): Payer: Self-pay | Admitting: Internal Medicine

## 2024-07-02 ENCOUNTER — Encounter (HOSPITAL_COMMUNITY): Admission: EM | Disposition: A | Payer: Self-pay | Source: Home / Self Care | Attending: Emergency Medicine

## 2024-07-02 ENCOUNTER — Ambulatory Visit (HOSPITAL_COMMUNITY): Payer: Self-pay

## 2024-07-02 ENCOUNTER — Other Ambulatory Visit (HOSPITAL_COMMUNITY): Payer: Self-pay

## 2024-07-02 DIAGNOSIS — K625 Hemorrhage of anus and rectum: Secondary | ICD-10-CM | POA: Diagnosis not present

## 2024-07-02 DIAGNOSIS — K648 Other hemorrhoids: Secondary | ICD-10-CM

## 2024-07-02 DIAGNOSIS — K640 First degree hemorrhoids: Secondary | ICD-10-CM

## 2024-07-02 DIAGNOSIS — I2510 Atherosclerotic heart disease of native coronary artery without angina pectoris: Secondary | ICD-10-CM | POA: Diagnosis not present

## 2024-07-02 DIAGNOSIS — D125 Benign neoplasm of sigmoid colon: Secondary | ICD-10-CM | POA: Diagnosis not present

## 2024-07-02 DIAGNOSIS — I110 Hypertensive heart disease with heart failure: Secondary | ICD-10-CM

## 2024-07-02 DIAGNOSIS — I5023 Acute on chronic systolic (congestive) heart failure: Secondary | ICD-10-CM

## 2024-07-02 HISTORY — PX: COLONOSCOPY: SHX5424

## 2024-07-02 HISTORY — PX: POLYPECTOMY: SHX149

## 2024-07-02 LAB — COMPREHENSIVE METABOLIC PANEL WITH GFR
ALT: 19 U/L (ref 0–44)
AST: 19 U/L (ref 15–41)
Albumin: 3.7 g/dL (ref 3.5–5.0)
Alkaline Phosphatase: 70 U/L (ref 38–126)
Anion gap: 9 (ref 5–15)
BUN: 12 mg/dL (ref 6–20)
CO2: 25 mmol/L (ref 22–32)
Calcium: 9.1 mg/dL (ref 8.9–10.3)
Chloride: 104 mmol/L (ref 98–111)
Creatinine, Ser: 1.25 mg/dL — ABNORMAL HIGH (ref 0.61–1.24)
GFR, Estimated: >60 mL/min
Glucose, Bld: 85 mg/dL (ref 70–99)
Potassium: 4.7 mmol/L (ref 3.5–5.1)
Sodium: 137 mmol/L (ref 135–145)
Total Bilirubin: 0.7 mg/dL (ref 0.0–1.2)
Total Protein: 6.3 g/dL — ABNORMAL LOW (ref 6.5–8.1)

## 2024-07-02 LAB — CBC
HCT: 42.1 % (ref 39.0–52.0)
Hemoglobin: 13.4 g/dL (ref 13.0–17.0)
MCH: 23.8 pg — ABNORMAL LOW (ref 26.0–34.0)
MCHC: 31.8 g/dL (ref 30.0–36.0)
MCV: 74.9 fL — ABNORMAL LOW (ref 80.0–100.0)
Platelets: 188 K/uL (ref 150–400)
RBC: 5.62 MIL/uL (ref 4.22–5.81)
RDW: 16.6 % — ABNORMAL HIGH (ref 11.5–15.5)
WBC: 9 K/uL (ref 4.0–10.5)
nRBC: 0 % (ref 0.0–0.2)

## 2024-07-02 MED ORDER — EPHEDRINE SULFATE (PRESSORS) 25 MG/5ML IV SOSY
PREFILLED_SYRINGE | INTRAVENOUS | Status: DC | PRN
  Administered 2024-07-02 (×2): 7.5 mg via INTRAVENOUS

## 2024-07-02 MED ORDER — PHENYLEPHRINE HCL-NACL 20-0.9 MG/250ML-% IV SOLN
INTRAVENOUS | Status: DC | PRN
  Administered 2024-07-02: 40 ug/min via INTRAVENOUS

## 2024-07-02 MED ORDER — SACUBITRIL-VALSARTAN 24-26 MG PO TABS
1.0000 | ORAL_TABLET | Freq: Two times a day (BID) | ORAL | Status: DC
  Administered 2024-07-02: 1 via ORAL
  Filled 2024-07-02: qty 1

## 2024-07-02 MED ORDER — MIDAZOLAM HCL 2 MG/2ML IJ SOLN
INTRAMUSCULAR | Status: AC
  Filled 2024-07-02: qty 2

## 2024-07-02 MED ORDER — SODIUM CHLORIDE 0.9 % IV SOLN
INTRAVENOUS | Status: AC | PRN
  Administered 2024-07-02: 500 mL via INTRAMUSCULAR

## 2024-07-02 MED ORDER — NICOTINE 14 MG/24HR TD PT24: 14.0000 mg | MEDICATED_PATCH | Freq: Every day | TRANSDERMAL | Status: DC

## 2024-07-02 MED ORDER — PROPOFOL 500 MG/50ML IV EMUL
INTRAVENOUS | Status: DC | PRN
  Administered 2024-07-02: 100 ug/kg/min via INTRAVENOUS

## 2024-07-02 MED ORDER — MIDAZOLAM HCL (PF) 2 MG/2ML IJ SOLN
INTRAMUSCULAR | Status: DC | PRN
  Administered 2024-07-02: 2 mg via INTRAVENOUS

## 2024-07-02 MED ORDER — PROPOFOL 10 MG/ML IV BOLUS
INTRAVENOUS | Status: DC | PRN
  Administered 2024-07-02 (×2): 30 mg via INTRAVENOUS

## 2024-07-02 NOTE — Anesthesia Preprocedure Evaluation (Addendum)
 "                                  Anesthesia Evaluation  Patient identified by MRN, date of birth, ID band Patient awake    Reviewed: Allergy & Precautions, H&P , NPO status , Patient's Chart, lab work & pertinent test results  Airway Mallampati: II  TM Distance: >3 FB Neck ROM: Full    Dental no notable dental hx. (+) Teeth Intact, Dental Advisory Given   Pulmonary asthma , Current Smoker and Patient abstained from smoking.   Pulmonary exam normal breath sounds clear to auscultation       Cardiovascular hypertension, + CAD and +CHF   Rhythm:Regular Rate:Normal     Neuro/Psych negative neurological ROS  negative psych ROS   GI/Hepatic negative GI ROS, Neg liver ROS,,,Rectal bleeding   Endo/Other  negative endocrine ROS    Renal/GU negative Renal ROS  negative genitourinary   Musculoskeletal negative musculoskeletal ROS (+)    Abdominal   Peds  Hematology negative hematology ROS (+) Lab Results      Component                Value               Date                      WBC                      9.0                 07/02/2024                HGB                      13.4                07/02/2024                HCT                      42.1                07/02/2024                MCV                      74.9 (L)            07/02/2024                PLT                      188                 07/02/2024             Lab Results      Component                Value               Date                      NA  137                 07/02/2024                K                        4.7                 07/02/2024                CO2                      25                  07/02/2024                GLUCOSE                  85                  07/02/2024                BUN                      12                  07/02/2024                CREATININE               1.25 (H)            07/02/2024                CALCIUM                    9.1                 07/02/2024                GFRNONAA                 >60                 07/02/2024              Anesthesia Other Findings   Reproductive/Obstetrics negative OB ROS                              Anesthesia Physical Anesthesia Plan  ASA: 4  Anesthesia Plan: MAC   Post-op Pain Management: Minimal or no pain anticipated   Induction: Intravenous  PONV Risk Score and Plan: 0 and Propofol  infusion and Treatment may vary due to age or medical condition  Airway Management Planned: Simple Face Mask and Nasal Cannula  Additional Equipment: None  Intra-op Plan:   Post-operative Plan:   Informed Consent: I have reviewed the patients History and Physical, chart, labs and discussed the procedure including the risks, benefits and alternatives for the proposed anesthesia with the patient or authorized representative who has indicated his/her understanding and acceptance.     Dental advisory given  Plan Discussed with: CRNA  Anesthesia Plan Comments:          Anesthesia Quick Evaluation  "

## 2024-07-02 NOTE — Progress Notes (Addendum)
 Pt is alert and fully oriented x 4, afebrile, stable hemodynamically, sinus rhythm on the monitor, no acute distress noted overnight.  Pt is under airborne/contact precaution per MD ordered.  Pt has no respiratory symptoms. Respiratory panel result is pending.   Pt has completed bowel preparation for colonoscopy at am. Pt has been well tolerated and ready for the procedure. He reports no more bloody stools tonight after multiple BM. Now his BM is watery and more yellow and clear.   Plan of care is reviewed. We will continue to monitor.  Wendi Dash, RN

## 2024-07-02 NOTE — Progress Notes (Addendum)
 Patient states has a pair of black skeetchers size 9  room checked and ED called  not able to find  gave patient patient expericen number to discharge lounge to give to patient

## 2024-07-02 NOTE — TOC Initial Note (Signed)
 Transition of Care [facility]) - Initial/Assessment Note    Patient Details  Name: Jesse Key MRN: [id number] Date of Birth: 10-Jan-1966  Transition of Care [facility]) CM/SW Contact:    Landry DELENA Senters, RN Phone Number: 07/02/2024, 9:56 AM  Clinical Narrative:                 RR:ejdu medical history  of  heart failure, CKD nonobstructive CAD, tobacco use, prior alcohol abuse, currently homeless comes for sob and rectal bleeding.  Patient at bedside is in no distress breathing comfortably and stable.   Patient is currently homeless. He reports he did not utilize any of the shelters from the list provided to him last week.  CM to provide shelter resources, info on AVS.   Patient has no DME, does have a PCP, reports he is able to get his medications and take them as prescribed.   Continued medical workup.  CM will continue to follow.   Expected Discharge Plan: Homeless Shelter Barriers to Discharge: Continued Medical Work up   Patient Goals and CMS Choice            Expected Discharge Plan and Services       Living arrangements for the past 2 months: Homeless                                      Prior Living Arrangements/Services Living arrangements for the past 2 months: Homeless Lives with:: Self Patient language and need for interpreter reviewed:: Yes Do you feel safe going back to the place where you live?: No   patient is homeless  Need for Family Participation in Patient Care: No (Comment) Care giver support system in place?: No (comment)   Criminal Activity/Legal Involvement Pertinent to Current Situation/Hospitalization: No - Comment as needed  Activities of Daily Living   ADL Screening (condition at time of admission) Independently performs ADLs?: Yes (appropriate for developmental age) Is the patient deaf or have difficulty hearing?: No Does the patient have difficulty seeing, even when wearing glasses/contacts?: No Does the patient have difficulty  concentrating, remembering, or making decisions?: No  Permission Sought/Granted                  Emotional Assessment Appearance:: Developmentally appropriate Attitude/Demeanor/Rapport: Engaged Affect (typically observed): Calm Orientation: : Oriented to Self, Oriented to Place, Oriented to  Time, Oriented to Situation Alcohol / Substance Use: Alcohol Use, Tobacco Use Psych Involvement: No (comment)  Admission diagnosis:  Shortness of breath [R06.02] Rectal bleeding [K62.5] BRBPR (bright red blood per rectum) [K62.5] Chest pain, unspecified type [R07.9] Patient Active Problem List   Diagnosis Date Noted   Bleeding internal hemorrhoids 07/02/2024   Rectal bleeding 06/30/2024   Asthma, chronic 06/26/2024   AKI (acute kidney injury) 06/25/2024   Lactic acidosis 06/25/2024   CHF (congestive heart failure) (HCC) 06/24/2024   Nonischemic cardiomyopathy (HCC) 06/24/2024   Demand ischemia (HCC) 06/24/2024   Acute heart failure with reduced ejection fraction (HFrEF) (HCC) 06/24/2024   Acute gout 04/04/2024   Chest pain 04/02/2024   Chronic kidney disease, stage 3a (HCC) 04/02/2024   Hyperkalemia 04/02/2024   CAD (coronary artery disease) 04/02/2024   Acute on chronic systolic CHF (congestive heart failure) (HCC) 04/02/2024   Shortness of breath 11/25/2023   Essential hypertension 11/24/2023   Heart failure (HCC) 11/23/2023   ETOH abuse 11/23/2023   Elevated troponin 11/23/2023   Leukocytosis  11/23/2023   Acute pulmonary edema (HCC) 11/23/2023   PCP:  Celestia Rosaline SQUIBB, NP Pharmacy:   [facility] [address] [place] [address] Phone: [phone] Fax: [phone]  [facility] [address] [place] [address] Phone: [phone] Fax: [phone]  [facility] [address] [place] [address] Phone: [phone]  Fax: [phone]     Social Drivers of Health (SDOH) Social History: SDOH Screenings   Food Insecurity: Food Insecurity Present (07/01/2024)  Housing: High Risk (07/01/2024)  Transportation Needs: Unmet Transportation Needs (07/01/2024)  Utilities: At Risk (07/01/2024)  Depression (PHQ2-9): Medium Risk (06/04/2024)  Financial Resource Strain: High Risk (04/12/2024)  Stress: Stress Concern Present (06/04/2024)  Tobacco Use: High Risk (07/02/2024)  Health Literacy: Adequate Health Literacy (01/03/2024)   SDOH Interventions:     Readmission Risk Interventions    06/24/2024    3:25 PM  Readmission Risk Prevention Plan  Transportation Screening Complete  Home Care Screening Complete  Medication Review (RN CM) Complete

## 2024-07-02 NOTE — Anesthesia Postprocedure Evaluation (Signed)
"   Anesthesia Post Note  Patient: Jesse Key  Procedure(s) Performed: COLONOSCOPY POLYPECTOMY, INTESTINE     Patient location during evaluation: PACU Anesthesia Type: MAC Level of consciousness: awake and alert Pain management: pain level controlled Vital Signs Assessment: post-procedure vital signs reviewed and stable Respiratory status: spontaneous breathing, nonlabored ventilation, respiratory function stable and patient connected to nasal cannula oxygen Cardiovascular status: stable and blood pressure returned to baseline Postop Assessment: no apparent nausea or vomiting Anesthetic complications: no   No notable events documented.  Last Vitals:  Vitals:   07/02/24 0920 07/02/24 0934  BP: 103/71 114/69  Pulse: 94   Resp: (!) 26   Temp:  (!) 36.4 C  SpO2: 97%     Last Pain:  Vitals:   07/02/24 0934  TempSrc: Oral  PainSc:                  [name] Bridger Pizzi      "

## 2024-07-02 NOTE — Discharge Instructions (Signed)

## 2024-07-02 NOTE — TOC CAGE-AID Note (Signed)
 Transition of Care [facility]) - CAGE-AID Screening   Patient Details  Name: Jesse Key MRN: [id number] Date of Birth: [birth year]  Transition of [facility]) CM/SW Contact:    Landry DELENA Senters, RN Phone Number: 07/02/2024, 9:59 AM   Clinical Narrative:  Patient reports occasional alcohol use. Reports this is not a problem, he can stop drinking anytime.   Patient denies the need for inpatient or outpatient counseling resources.   CAGE-AID Screening: Substance Abuse Screening unable to be completed due to: : Patient Refused  Have You Ever Felt You Ought to Cut Down on Your Drinking or Drug Use?: No Have People Annoyed You By Critizing Your Drinking Or Drug Use?: No Have You Felt Bad Or Guilty About Your Drinking Or Drug Use?: No Have You Ever Had a Drink or Used Drugs First Thing In The Morning to Steady Your Nerves or to Get Rid of a Hangover?: No CAGE-AID Score: 0  Substance Abuse Education Offered: Yes

## 2024-07-02 NOTE — Progress Notes (Signed)
 DISCHARGE NOTE HOME Jesse Key to be discharged Home per MD order. Discussed prescriptions and follow up appointments with the patient. Prescriptions given to patient; medication list explained in detail. Patient verbalized understanding.  Skin clean, dry and intact without evidence of skin break down, no evidence of skin tears noted. IV catheter discontinued intact. Site without signs and symptoms of complications. Dressing and pressure applied. Pt denies pain at the site currently. No complaints noted.  Patient free of lines, drains, and wounds.   An After Visit Summary (AVS) was printed and given to the patient. Patient escorted via wheelchair, and discharged home via private auto.  [name], RN
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
## 2024-07-02 NOTE — Op Note (Signed)
 [facility] Patient Name: Jesse Key Procedure Date : 07/02/2024 MRN: [id number] Attending MD: Victory CROME. Legrand , MD, [id number] Date of Birth: [birth year] CSN: [id number] Age: 58 Admit Type: Inpatient Procedure:                Colonoscopy Indications:              Rectal bleeding - first episode, see inpatient                            consult for details. Hgb has remained normal and no                            further bleeding since admission Providers:                Victory L. Legrand, MD, Hoy jenkins Penner, RN, Felice Sar, Technician Referring MD:             [facility] Medicines:                Monitored Anesthesia Care Complications:            No immediate complications. Estimated Blood Loss:     Estimated blood loss was minimal. Procedure:                Pre-Anesthesia Assessment:                           - Prior to the procedure, a History and Physical                            was performed, and patient medications and                            allergies were reviewed. The patient's tolerance of                            previous anesthesia was also reviewed. The risks                            and benefits of the procedure and the sedation                            options and risks were discussed with the patient.                            All questions were answered, and informed consent                            was obtained. Prior Anticoagulants: The patient has                            taken no anticoagulant or antiplatelet agents. ASA  Grade Assessment: IV - A patient with severe                            systemic disease that is a constant threat to life.                            After reviewing the risks and benefits, the patient                            was deemed in satisfactory condition to undergo the                            procedure.                           After  obtaining informed consent, the colonoscope                            was passed under direct vision. Throughout the                            procedure, the patient's blood pressure, pulse, and                            oxygen saturations were monitored continuously. The                            CF-HQ190L ([id number] Olympus colonoscope was                            introduced through the anus and advanced to the the                            terminal ileum, with identification of the                            appendiceal orifice and IC valve. The colonoscopy                            was performed without difficulty. The patient                            tolerated the procedure well. The quality of the                            bowel preparation was good. The terminal ileum,                            ileocecal valve, appendiceal orifice, and rectum                            were photographed. The bowel preparation used was  SUPREP via split dose instruction. Scope In: 8:42:30 AM Scope Out: 8:55:18 AM Scope Withdrawal Time: 0 hours 11 minutes 23 seconds  Total Procedure Duration: 0 hours 12 minutes 48 seconds  Findings:      The perianal and digital rectal examinations were normal.      The terminal ileum appeared normal.      Repeat examination of right colon under NBI performed.      A diminutive polyp was found in the sigmoid colon. The polyp was       sessile. The polyp was removed with a cold snare. Resection and       retrieval were complete.      Internal hemorrhoids were found. The hemorrhoids were medium-sized and       Grade I (internal hemorrhoids that do not prolapse).      The exam was otherwise without abnormality on direct and retroflexion       views. Impression:               - The examined portion of the ileum was normal.                           - One diminutive polyp in the sigmoid colon,                            removed with  a cold snare. Resected and retrieved.                           - Internal hemorrhoids.                           - The examination was otherwise normal on direct                            and retroflexion views. Recommendation:           - Return patient to hospital ward for possible                            discharge same day.                           - Low sodium diet.                           - Continue present medications.                           - Await pathology results.                           - Repeat colonoscopy is recommended for                            surveillance. The colonoscopy date will be                            determined after pathology results from today's  exam become available for review.                           - High fiber diet and sufficient daily water intake                            (as best can be achieved) to avoid constipation.                           Return to GI office as needed, especially if                            recurrent bleeding. Procedure Code(s):        --- Professional ---                           [phone], Colonoscopy, flexible; with removal of                            tumor(s), polyp(s), or other lesion(s) by snare                            technique Diagnosis Code(s):        --- Professional ---                           K64.0, First degree hemorrhoids                           D12.5, Benign neoplasm of sigmoid colon                           K62.5, Hemorrhage of anus and rectum CPT copyright 2022 [employer]. All rights reserved. The codes documented in this report are preliminary and upon coder review may  be revised to meet current compliance requirements. Aubrynn Katona L. Legrand, MD 07/02/2024 9:01:58 AM This report has been signed electronically. Number of Addenda: 0

## 2024-07-02 NOTE — Discharge Summary (Signed)
 Physician Discharge Summary  Jesse Key FMW:[id number] DOB: 1965-11-15 DOA: 06/30/2024  PCP: Celestia Rosaline SQUIBB, NP  Admit date: 06/30/2024 Discharge date: 07/02/2024  Admitted From: (Homeless at shelter) Disposition:  (Same )  Recommendations for Outpatient Follow-up:  Follow up with PCP in 1-2 weeks Please obtain BMP/CBC in one week Please follow up on polyp biopsy result.  Diet recommendation: Heart Healthy   Brief/Interim Summary:  58 y.o. male with past medical history  of  heart failure, CKD nonobstructive CAD, tobacco use, prior alcohol abuse, currently homeless comes for sob and rectal bleeding, he was noted with no hypoxia, no respiratory infection or COPD exacerbation, no acute finding for dyspnea, he was seen by gastroenterology given his bright red blood per rectum, his hemoglobin has been stable, went for colonoscopy this a.m., was significant only for small colon polyp and external hemorrhoids, patient has been cleared for discharge.  BRBPR Bleeding from internal hemorrhoids - he was seen by gastroenterology given his bright red blood per rectum, his hemoglobin has been stable, went for colonoscopy this a.m., was significant only for small colon polyp and external hemorrhoids, patient has been cleared for discharge.   Chronic systolic CHF with reduced EF HTN -Compensated, not in acute exacerbation -Continue with home regimen, cardiology input appreciated   COPD - No wheezing, No respiratory distress or dyspnea, saturating 99% on room air this morning.   CKD 3a -Cr seems stable   Hx cocaine abuse -UDS neg for cocaine, was last pos on 12/24   ETOH abuse -needs to abstain from ETOH, not in withdrawals    Discharge Diagnoses:  Principal Problem:   Rectal bleeding Active Problems:   Bleeding internal hemorrhoids    Discharge Instructions  Discharge Instructions     Increase activity slowly   Complete by: As directed       Allergies as of  07/02/2024       Reactions   Shrimp [shellfish Allergy] Anaphylaxis, Itching   ALL SHELLFISH   Shellfish Allergy Itching        Medication List     TAKE these medications    albuterol  108 (90 Base) MCG/ACT inhaler Commonly known as: VENTOLIN  HFA Inhale 1-2 puffs into the lungs every 6 (six) hours as needed for wheezing or shortness of breath.   Digoxin  62.5 MCG Tabs Commonly known as: Lanoxin  Take 1 tablet by mouth daily.   empagliflozin  10 MG Tabs tablet Commonly known as: Jardiance  Take 1 tablet (10 mg total) by mouth daily before breakfast.   furosemide  40 MG tablet Commonly known as: LASIX  Take 1 tablet (40 mg total) by mouth daily.   nicotine  14 mg/24hr patch Commonly known as: NICODERM CQ  - dosed in mg/24 hours Place 1 patch (14 mg total) onto the skin daily.   rosuvastatin  20 MG tablet Commonly known as: CRESTOR  Take 1 tablet (20 mg total) by mouth daily.   sacubitril -valsartan  24-26 MG Commonly known as: ENTRESTO  Take 1 tablet by mouth 2 (two) times daily.        Allergies[1]  Consultations: Gastroenterology   Procedures/Studies: CT ABDOMEN PELVIS W CONTRAST Result Date: 06/30/2024 EXAM: CT ABDOMEN AND PELVIS WITH CONTRAST 06/30/2024 02:15:52 PM TECHNIQUE: CT of the abdomen and pelvis was performed with the administration of 75 mL of iohexol  (OMNIPAQUE ) 350 MG/ML injection. Multiplanar reformatted images are provided for review. Automated exposure control, iterative reconstruction, and/or weight-based adjustment of the mA/kV was utilized to reduce the radiation dose to as low as reasonably achievable. COMPARISON: CT angiography  chest 12:28.25 CLINICAL HISTORY: Rectal bleeding, tachycardia, chest pain, shortness of breath, fatigue. Rule out infection, diverticulitis, or other acute abnormality. FINDINGS: LOWER CHEST: No acute abnormality. LIVER: The liver is unremarkable. GALLBLADDER AND BILE DUCTS: Gallbladder is unremarkable. No biliary ductal  dilatation. SPLEEN: No acute abnormality. PANCREAS: No acute abnormality. ADRENAL GLANDS: No acute abnormality. KIDNEYS, URETERS AND BLADDER: Fluid density lesions of the kidneys likely represent simple renal cysts. Simple renal cysts do not require additional follow-up unless clinically indicated due to signs/symptoms. No stones in the kidneys or ureters. No hydronephrosis. No perinephric or periureteral stranding. Urinary bladder is unremarkable. GI AND BOWEL: Stomach demonstrates no acute abnormality. No small or large bowel thickening or dilatation. The appendix is unremarkable. There is no bowel obstruction. PERITONEUM AND RETROPERITONEUM: No ascites. No free air. VASCULATURE: Moderate to severe atherosclerotic plaque of the aorta. LYMPH NODES: No lymphadenopathy. REPRODUCTIVE ORGANS: The prostate is unremarkable. BONES AND SOFT TISSUES: No acute osseous abnormality. No focal soft tissue abnormality. IMPRESSION: 1. No acute findings in the abdomen or pelvis. Electronically signed by: Morgane Naveau MD 06/30/2024 03:43 PM EST RP Workstation: [id number]   CT Angio Chest PE W and/or Wo Contrast Result Date: 06/30/2024 EXAM: CTA CHEST 06/30/2024 02:15:52 PM TECHNIQUE: CTA of the chest was performed after the administration of intravenous contrast. Multiplanar reformatted images are provided for review. MIP images are provided for review. Automated exposure control, iterative reconstruction, and/or weight based adjustment of the mA/kV was utilized to reduce the radiation dose to as low as reasonably achievable. COMPARISON: ct angio chest 06/23/24 CLINICAL HISTORY: Pulmonary embolism (PE) suspected, high prob; Chest pain, shortness of breath, tachycardia, recent admission. Also recent heart failure and productive cough. Rule out pneumonia versus PE versus fluid overload versus other. Rectal bleeding, tachycardia, chest pain, shortness of breath, fatigue. Rule out infection, FINDINGS: PULMONARY ARTERIES: Pulmonary  arteries are adequately opacified for evaluation. No acute pulmonary embolus. Main pulmonary artery is normal in caliber. MEDIASTINUM: The heart demonstrates 4-vessel coronary artery calcification and left ventricle enlargement. Moderate atherosclerotic plaque is present. There is no acute abnormality of the thoracic aorta. LYMPH NODES: No mediastinal, hilar or axillary lymphadenopathy. LUNGS AND PLEURA: Diffuse mild bronchial wall thickening. No focal consolidation or pulmonary edema. No evidence of pleural effusion or pneumothorax. UPPER ABDOMEN: A fluid lesion of the left kidney likely represents a simple renal cyst. Simple renal cysts do not require additional follow-up unless clinically indicated due to signs/symptoms. SOFT TISSUES AND BONES: No acute bone or soft tissue abnormality. IMPRESSION: 1. No pulmonary embolism. 2. Left ventricle enlargement and 4-vessel coronary artery calcification. Electronically signed by: Morgane Naveau MD 06/30/2024 03:41 PM EST RP Workstation: [id number]   DG Chest 2 View Result Date: 06/30/2024 CLINICAL DATA:  Chest pain. EXAM: CHEST - 2 VIEW COMPARISON:  June 23, 2024 FINDINGS: The heart size and mediastinal contours are within normal limits. No acute infiltrate, pleural effusion or pneumothorax is identified. The visualized skeletal structures are unremarkable. IMPRESSION: No active cardiopulmonary disease. Electronically Signed   By: Suzen Dials M.D.   On: 06/30/2024 11:52   US  EKG SITE RITE Result Date: 06/25/2024 If Site Rite image not attached, placement could not be confirmed due to current cardiac rhythm.  ECHOCARDIOGRAM COMPLETE Result Date: 06/24/2024    ECHOCARDIOGRAM REPORT   Patient Name:   HUSSEIN MACDOUGAL Feider Date of Exam: 06/24/2024 Medical Rec #:  [id number]        Height:       65.0 in Accession #:  [id number]       Weight:       138.7 lb Date of Birth:  22-Sep-1965         BSA:          1.693 m Patient Age:    58 years         BP:            97/66 mmHg Patient Gender: M                HR:           89 bpm. Exam Location:  Inpatient Procedure: 2D Echo, Color Doppler, Cardiac Doppler and Intracardiac            Opacification Agent (Both Spectral and Color Flow Doppler were            utilized during procedure). Indications:    Chest pain R07.9  History:        Patient has prior history of Echocardiogram examinations, most                 recent 04/02/2024. Signs/Symptoms:Chest Pain.  Sonographer:    Sydnee Wilson RDCS Referring Phys: [id number] SARA-MAIZ A THOMAS IMPRESSIONS  1. Left ventricular ejection fraction, by estimation, is <20%. The left ventricle has severely decreased function. The left ventricle demonstrates global hypokinesis. The left ventricular internal cavity size was moderately dilated. Left ventricular diastolic parameters are indeterminate.  2. Right ventricular systolic function is normal. The right ventricular size is normal.  3. Left atrial size was moderately dilated.  4. The mitral valve is normal in structure. Mild mitral valve regurgitation. No evidence of mitral stenosis.  5. The aortic valve is tricuspid. There is mild calcification of the aortic valve. Aortic valve regurgitation is trivial. Aortic valve sclerosis/calcification is present, without any evidence of aortic stenosis.  6. The inferior vena cava is normal in size with greater than 50% respiratory variability, suggesting right atrial pressure of 3 mmHg. FINDINGS  Left Ventricle: Left ventricular ejection fraction, by estimation, is <20%. The left ventricle has severely decreased function. The left ventricle demonstrates global hypokinesis. The left ventricular internal cavity size was moderately dilated. There is no left ventricular hypertrophy. Left ventricular diastolic parameters are indeterminate. Right Ventricle: The right ventricular size is normal. No increase in right ventricular wall thickness. Right ventricular systolic function is normal. Left Atrium: Left  atrial size was moderately dilated. Right Atrium: Right atrial size was normal in size. Pericardium: There is no evidence of pericardial effusion. Mitral Valve: The mitral valve is normal in structure. Mild mitral valve regurgitation. No evidence of mitral valve stenosis. Tricuspid Valve: The tricuspid valve is normal in structure. Tricuspid valve regurgitation is trivial. No evidence of tricuspid stenosis. Aortic Valve: The aortic valve is tricuspid. There is mild calcification of the aortic valve. Aortic valve regurgitation is trivial. Aortic valve sclerosis/calcification is present, without any evidence of aortic stenosis. Aortic valve mean gradient measures 1.0 mmHg. Aortic valve peak gradient measures 3.2 mmHg. Pulmonic Valve: The pulmonic valve was normal in structure. Pulmonic valve regurgitation is not visualized. No evidence of pulmonic stenosis. Aorta: The aortic root is normal in size and structure. Venous: The inferior vena cava is normal in size with greater than 50% respiratory variability, suggesting right atrial pressure of 3 mmHg. IAS/Shunts: There is right bowing of the interatrial septum, suggestive of elevated left atrial pressure. No atrial level shunt detected by color flow Doppler.  LEFT VENTRICLE PLAX 2D LVIDd:  6.00 cm      Diastology LVIDs:         5.30 cm      LV e' medial:    10.60 cm/s LV PW:         1.40 cm      LV E/e' medial:  12.9 LV IVS:        1.00 cm      LV e' lateral:   13.40 cm/s LVOT diam:     2.00 cm      LV E/e' lateral: 10.2 LVOT Area:     3.14 cm  LV Volumes (MOD) LV vol d, MOD A2C: 182.0 ml LV vol d, MOD A4C: 199.0 ml LV vol s, MOD A2C: 156.0 ml LV vol s, MOD A4C: 148.0 ml LV SV MOD A2C:     26.0 ml LV SV MOD A4C:     199.0 ml LV SV MOD BP:      38.6 ml RIGHT VENTRICLE RV S prime:     12.30 cm/s TAPSE (M-mode): 1.9 cm LEFT ATRIUM             Index        RIGHT ATRIUM           Index LA diam:        3.40 cm 2.01 cm/m   RA Area:     12.30 cm LA Vol (A2C):   50.3  ml 29.71 ml/m  RA Volume:   27.50 ml  16.24 ml/m LA Vol (A4C):   42.1 ml 24.87 ml/m LA Biplane Vol: 48.9 ml 28.88 ml/m  AORTIC VALVE AV Vmax:      90.10 cm/s AV Vmean:     50.200 cm/s AV VTI:       0.150 m AV Peak Grad: 3.2 mmHg AV Mean Grad: 1.0 mmHg  AORTA Ao Root diam: 3.10 cm Ao Asc diam:  2.60 cm MITRAL VALVE                TRICUSPID VALVE MV Area (PHT): 6.48 cm     TR Peak grad:   15.2 mmHg MV Decel Time: 117 msec     TR Vmax:        195.00 cm/s MV E velocity: 137.00 cm/s MV A velocity: 28.30 cm/s   SHUNTS MV E/A ratio:  4.84         Systemic Diam: 2.00 cm Toribio Fuel MD Electronically signed by Toribio Fuel MD Signature Date/Time: 06/24/2024/6:49:52 PM    Final    CT Angio Chest PE W and/or Wo Contrast Result Date: 06/23/2024 CLINICAL DATA:  Shortness of breath EXAM: CT ANGIOGRAPHY CHEST WITH CONTRAST TECHNIQUE: Multidetector CT imaging of the chest was performed using the standard protocol during bolus administration of intravenous contrast. Multiplanar CT image reconstructions and MIPs were obtained to evaluate the vascular anatomy. RADIATION DOSE REDUCTION: This exam was performed according to the departmental dose-optimization program which includes automated exposure control, adjustment of the mA and/or kV according to patient size and/or use of iterative reconstruction technique. CONTRAST:  75mL OMNIPAQUE  IOHEXOL  350 MG/ML SOLN COMPARISON:  Chest x-ray from earlier in the same day. FINDINGS: Cardiovascular: Thoracic aorta demonstrates mild atherosclerotic calcifications without aneurysmal dilatation. Coronary calcifications are noted. The heart is enlarged in size. Pulmonary artery shows a normal branching pattern bilaterally. No intraluminal filling defect to suggest pulmonary embolism is noted. Mediastinum/Nodes: Thoracic inlet is within normal limits. No hilar or mediastinal adenopathy is noted. The esophagus as visualized is within normal limits.  Lungs/Pleura: Lungs are well  aerated bilaterally. No focal infiltrate or sizable effusion is seen. A few tiny subpleural nodules are noted within the left upper lobe. These are stable in appearance from the prior exam. Upper Abdomen: Visualized upper abdomen is within normal limits. Musculoskeletal: Bony structures are within normal limits. Review of the MIP images confirms the above findings. IMPRESSION: No evidence of pulmonary emboli. A few tiny subpleural nodules are noted in the left upper lobe.No routine follow-up imaging is recommended per Fleischner Society Guidelines. Aortic Atherosclerosis (ICD10-I70.0). Electronically Signed   By: Oneil Devonshire M.D.   On: 06/23/2024 23:56   DG Chest Portable 1 View Result Date: 06/23/2024 EXAM: 1 VIEW XRAY OF THE CHEST 06/23/2024 10:45:00 PM COMPARISON: 04/01/2024 CLINICAL HISTORY: sob, cp, chf FINDINGS: LUNGS AND PLEURA: Pulmonary vascular congestion. Kerley B lines in the lower lungs compatible with interstitial edema. No focal pulmonary opacity. No pleural effusion. No pneumothorax. HEART AND MEDIASTINUM: Unchanged cardiomegaly. No acute abnormality of the mediastinal silhouette. BONES AND SOFT TISSUES: No acute osseous abnormality. IMPRESSION: 1. CHF with mild interstitial edema. Electronically signed by: Norman Gatlin MD 06/23/2024 10:57 PM EST RP Workstation: [id number]     Subjective:  Seen this morning, denies any dyspnea or chest pain, Discharge Exam: Vitals:   07/02/24 0920 07/02/24 0934  BP: 103/71 114/69  Pulse: 94   Resp: (!) 26   Temp:  (!) 97.5 F (36.4 C)  SpO2: 97%    Vitals:   07/02/24 0903 07/02/24 0910 07/02/24 0920 07/02/24 0934  BP: 107/69 95/66 103/71 114/69  Pulse: 96 93 94   Resp: (!) 29 (!) 27 (!) 26   Temp: (!) 96.9 F (36.1 C)   (!) 97.5 F (36.4 C)  TempSrc: Temporal   Oral  SpO2: 98% 98% 97%   Weight:      Height:        General: Pt is alert, awake, not in acute distress Cardiovascular: RRR Respiratory: CTA bilaterally Abdominal:  Soft Extremities: no edema, no cyanosis    The results of significant diagnostics from this hospitalization (including imaging, microbiology, ancillary and laboratory) are listed below for reference.     Microbiology: Recent Results (from the past 240 hours)  Respiratory (~20 pathogens) panel by PCR     Status: None   Collection Time: 06/24/24  7:55 AM   Specimen: Nasopharyngeal Swab; Respiratory  Result Value Ref Range Status   Adenovirus NOT DETECTED NOT DETECTED Final   Coronavirus 229E NOT DETECTED NOT DETECTED Final    Comment: (NOTE) The Coronavirus on the Respiratory Panel, DOES NOT test for the novel  Coronavirus (2019 nCoV)    Coronavirus HKU1 NOT DETECTED NOT DETECTED Final   Coronavirus NL63 NOT DETECTED NOT DETECTED Final   Coronavirus OC43 NOT DETECTED NOT DETECTED Final   Metapneumovirus NOT DETECTED NOT DETECTED Final   Rhinovirus / Enterovirus NOT DETECTED NOT DETECTED Final   Influenza A NOT DETECTED NOT DETECTED Final   Influenza B NOT DETECTED NOT DETECTED Final   Parainfluenza Virus 1 NOT DETECTED NOT DETECTED Final   Parainfluenza Virus 2 NOT DETECTED NOT DETECTED Final   Parainfluenza Virus 3 NOT DETECTED NOT DETECTED Final   Parainfluenza Virus 4 NOT DETECTED NOT DETECTED Final   Respiratory Syncytial Virus NOT DETECTED NOT DETECTED Final   Bordetella pertussis NOT DETECTED NOT DETECTED Final   Bordetella Parapertussis NOT DETECTED NOT DETECTED Final   Chlamydophila pneumoniae NOT DETECTED NOT DETECTED Final   Mycoplasma pneumoniae NOT DETECTED NOT DETECTED Final  Comment: Performed at [facility], [address] [place], [place] [address]  Resp panel by RT-PCR (RSV, Flu A&B, Covid)     Status: None   Collection Time: 06/26/24  4:56 PM  Result Value Ref Range Status   SARS Coronavirus 2 by RT PCR NEGATIVE NEGATIVE Final   Influenza A by PCR NEGATIVE NEGATIVE Final   Influenza B by PCR NEGATIVE NEGATIVE Final    Comment: (NOTE) The Xpert  Xpress SARS-CoV-2/FLU/RSV plus assay is intended as an aid in the diagnosis of influenza from Nasopharyngeal swab specimens and should not be used as a sole basis for treatment. Nasal washings and aspirates are unacceptable for Xpert Xpress SARS-CoV-2/FLU/RSV testing.  Fact Sheet for Patients: bloggercourse.com  Fact Sheet for Healthcare Providers: seriousbroker.it  This test is not yet approved or cleared by the [place]  FDA and has been authorized for detection and/or diagnosis of SARS-CoV-2 by FDA under an Emergency Use Authorization (EUA). This EUA will remain in effect (meaning this test can be used) for the duration of the COVID-19 declaration under Section 564(b)(1) of the Act, 21 U.S.C. section 360bbb-3(b)(1), unless the authorization is terminated or revoked.     Resp Syncytial Virus by PCR NEGATIVE NEGATIVE Final    Comment: (NOTE) Fact Sheet for Patients: bloggercourse.com  Fact Sheet for Healthcare Providers: seriousbroker.it  This test is not yet approved or cleared by the [place]  FDA and has been authorized for detection and/or diagnosis of SARS-CoV-2 by FDA under an Emergency Use Authorization (EUA). This EUA will remain in effect (meaning this test can be used) for the duration of the COVID-19 declaration under Section 564(b)(1) of the Act, 21 U.S.C. section 360bbb-3(b)(1), unless the authorization is terminated or revoked.  Performed at [facility], [address] [place], [place] [address]      Labs: BNP (last 3 results) Recent Labs    04/01/24 1327 04/12/24 1014 05/21/24 1004  BNP >4,500.0* 3,217.5* 3,013.7*   Basic Metabolic Panel: Recent Labs  Lab 06/26/24 0315 06/27/24 0505 06/28/24 0500 06/30/24 0922 06/30/24 2050 07/01/24 0157 07/02/24 0316  NA 139 138 139 137 139 140 137  K 4.8 4.4 4.6 5.2* 4.6 4.7 4.7  CL 103 104  107 102 107 105 104  CO2 26 25 25 25 23 27 25   GLUCOSE 118* 107* 88 145* 134* 105* 85  BUN 42* 30* 24* 23* 18 18 12   CREATININE 1.61* 1.38* 1.36* 1.25* 1.28* 1.56* 1.25*  CALCIUM  9.4 9.1 8.6* 9.4 8.7* 8.7* 9.1  MG 2.4 2.6* 2.3  --   --   --   --    Liver Function Tests: Recent Labs  Lab 06/25/24 1329 06/26/24 0315 06/30/24 0922 07/02/24 0316  AST 43* 30 26 19   ALT 30 27 22 19   ALKPHOS 100 86 84 70  BILITOT 0.5 0.4 0.3 0.7  PROT 7.2 7.0 7.3 6.3*  ALBUMIN 3.7 3.6 4.1 3.7   No results for input(s): LIPASE, AMYLASE in the last 168 hours. No results for input(s): AMMONIA in the last 168 hours. CBC: Recent Labs  Lab 06/26/24 0315 06/27/24 0505 06/28/24 0500 06/30/24 0922 07/01/24 0157 07/01/24 1151 07/01/24 2304 07/02/24 0316  WBC 21.8* 17.2* 10.2 11.5* 10.9*  --   --  9.0  NEUTROABS 18.6*  --   --   --   --   --   --   --   HGB 14.5 14.6 14.9 14.8 13.2 13.7 13.5 13.4  HCT 44.2 46.3  47.5 46.8 41.6 43.6 43.3 42.1  MCV 73.4* 74.1* 74.7* 76.1* 75.2*  --   --  74.9*  PLT 265 244 216 210 194  --   --  188   Cardiac Enzymes: No results for input(s): CKTOTAL, CKMB, CKMBINDEX, TROPONINI in the last 168 hours. BNP: Invalid input(s): POCBNP CBG: No results for input(s): GLUCAP in the last 168 hours. D-Dimer No results for input(s): DDIMER in the last 72 hours. Hgb A1c No results for input(s): HGBA1C in the last 72 hours. Lipid Profile No results for input(s): CHOL, HDL, LDLCALC, TRIG, CHOLHDL, LDLDIRECT in the last 72 hours. Thyroid function studies No results for input(s): TSH, T4TOTAL, T3FREE, THYROIDAB in the last 72 hours.  Invalid input(s): FREET3 Anemia work up Recent Labs    07/01/24 1151  FERRITIN 218  TIBC 365  IRON  88  RETICCTPCT 1.5   Urinalysis    Component Value Date/Time   COLORURINE YELLOW 07/01/2024 0606   APPEARANCEUR CLEAR 07/01/2024 0606   LABSPEC 1.021 07/01/2024 0606   PHURINE 5.0 07/01/2024 0606    GLUCOSEU >=500 (A) 07/01/2024 0606   HGBUR SMALL (A) 07/01/2024 0606   BILIRUBINUR NEGATIVE 07/01/2024 0606   KETONESUR NEGATIVE 07/01/2024 0606   PROTEINUR 30 (A) 07/01/2024 0606   NITRITE NEGATIVE 07/01/2024 0606   LEUKOCYTESUR NEGATIVE 07/01/2024 0606   Sepsis Labs Recent Labs  Lab 06/28/24 0500 06/30/24 0922 07/01/24 0157 07/02/24 0316  WBC 10.2 11.5* 10.9* 9.0   Microbiology Recent Results (from the past 240 hours)  Respiratory (~20 pathogens) panel by PCR     Status: None   Collection Time: 06/24/24  7:55 AM   Specimen: Nasopharyngeal Swab; Respiratory  Result Value Ref Range Status   Adenovirus NOT DETECTED NOT DETECTED Final   Coronavirus 229E NOT DETECTED NOT DETECTED Final    Comment: (NOTE) The Coronavirus on the Respiratory Panel, DOES NOT test for the novel  Coronavirus (2019 nCoV)    Coronavirus HKU1 NOT DETECTED NOT DETECTED Final   Coronavirus NL63 NOT DETECTED NOT DETECTED Final   Coronavirus OC43 NOT DETECTED NOT DETECTED Final   Metapneumovirus NOT DETECTED NOT DETECTED Final   Rhinovirus / Enterovirus NOT DETECTED NOT DETECTED Final   Influenza A NOT DETECTED NOT DETECTED Final   Influenza B NOT DETECTED NOT DETECTED Final   Parainfluenza Virus 1 NOT DETECTED NOT DETECTED Final   Parainfluenza Virus 2 NOT DETECTED NOT DETECTED Final   Parainfluenza Virus 3 NOT DETECTED NOT DETECTED Final   Parainfluenza Virus 4 NOT DETECTED NOT DETECTED Final   Respiratory Syncytial Virus NOT DETECTED NOT DETECTED Final   Bordetella pertussis NOT DETECTED NOT DETECTED Final   Bordetella Parapertussis NOT DETECTED NOT DETECTED Final   Chlamydophila pneumoniae NOT DETECTED NOT DETECTED Final   Mycoplasma pneumoniae NOT DETECTED NOT DETECTED Final    Comment: Performed at [facility], [address] [place], [place] [address]  Resp panel by RT-PCR (RSV, Flu A&B, Covid)     Status: None   Collection Time: 06/26/24  4:56 PM  Result Value Ref Range Status    SARS Coronavirus 2 by RT PCR NEGATIVE NEGATIVE Final   Influenza A by PCR NEGATIVE NEGATIVE Final   Influenza B by PCR NEGATIVE NEGATIVE Final    Comment: (NOTE) The Xpert Xpress SARS-CoV-2/FLU/RSV plus assay is intended as an aid in the diagnosis of influenza from Nasopharyngeal swab specimens and should not be used as a sole basis for treatment. Nasal washings and aspirates are unacceptable for Xpert  Xpress SARS-CoV-2/FLU/RSV testing.  Fact Sheet for Patients: bloggercourse.com  Fact Sheet for Healthcare Providers: seriousbroker.it  This test is not yet approved or cleared by the [place]  FDA and has been authorized for detection and/or diagnosis of SARS-CoV-2 by FDA under an Emergency Use Authorization (EUA). This EUA will remain in effect (meaning this test can be used) for the duration of the COVID-19 declaration under Section 564(b)(1) of the Act, 21 U.S.C. section 360bbb-3(b)(1), unless the authorization is terminated or revoked.     Resp Syncytial Virus by PCR NEGATIVE NEGATIVE Final    Comment: (NOTE) Fact Sheet for Patients: bloggercourse.com  Fact Sheet for Healthcare Providers: seriousbroker.it  This test is not yet approved or cleared by the [place]  FDA and has been authorized for detection and/or diagnosis of SARS-CoV-2 by FDA under an Emergency Use Authorization (EUA). This EUA will remain in effect (meaning this test can be used) for the duration of the COVID-19 declaration under Section 564(b)(1) of the Act, 21 U.S.C. section 360bbb-3(b)(1), unless the authorization is terminated or revoked.  Performed at [facility], [address] [place], [place] [address]      Time coordinating discharge: Over 30 minutes  SIGNED:   Brayton Lye, MD  [facility] 07/02/2024, 10:23 AM Pager   If 7PM-7AM, please contact  night-coverage [email or web address] Password [email or web address]    [1]  Allergies Allergen Reactions   Shrimp [Shellfish Allergy] Anaphylaxis and Itching    ALL SHELLFISH   Shellfish Allergy Itching

## 2024-07-02 NOTE — Transfer of Care (Signed)
 Immediate Anesthesia Transfer of Care Note  Patient: Jesse Key  Procedure(s) Performed: COLONOSCOPY POLYPECTOMY, INTESTINE  Patient Location: PACU and Endoscopy Unit  Anesthesia Type:MAC  Level of Consciousness: awake and alert   Airway & Oxygen Therapy: Patient Spontanous Breathing and Patient connected to face mask oxygen  Post-op Assessment: Report given to RN and Post -op Vital signs reviewed and stable  Post vital signs: Reviewed and stable  Last Vitals:  Vitals Value Taken Time  BP    Temp    Pulse 95 07/02/24 09:06  Resp 32 07/02/24 09:06  SpO2 99 % 07/02/24 09:06  Vitals shown include unfiled device data.  Last Pain:  Vitals:   07/02/24 0800  TempSrc: Temporal  PainSc: 0-No pain         Complications: No notable events documented.

## 2024-07-02 NOTE — TOC Transition Note (Signed)
 Transition of Care [facility]) - Discharge Note   Patient Details  Name: Jesse Key MRN: [id number] Date of Birth: 19-Jan-1966  [facility]) CM/SW Contact:  Landry DELENA Senters, RN Phone Number: 07/02/2024, 10:29 AM   Clinical Narrative:     Patient will be discharging from hospital today, he is homeless, CM did discuss this with patient. List of resources for housing and shelters placed on AVS.   No further needs identified by CM.  Final next level of care: Homeless Shelter Barriers to Discharge: No Barriers Identified   Patient Goals and CMS Choice            Discharge Placement                       Discharge Plan and Services Additional resources added to the After Visit Summary for                                       Social Drivers of Health (SDOH) Interventions SDOH Screenings   Food Insecurity: Food Insecurity Present (07/01/2024)  Housing: High Risk (07/01/2024)  Transportation Needs: Unmet Transportation Needs (07/01/2024)  Utilities: At Risk (07/01/2024)  Depression (PHQ2-9): Medium Risk (06/04/2024)  Financial Resource Strain: High Risk (04/12/2024)  Stress: Stress Concern Present (06/04/2024)  Tobacco Use: High Risk (07/02/2024)  Health Literacy: Adequate Health Literacy (01/03/2024)     Readmission Risk Interventions    06/24/2024    3:25 PM  Readmission Risk Prevention Plan  Transportation Screening Complete  Home Care Screening Complete  Medication Review (RN CM) Complete

## 2024-07-02 NOTE — Interval H&P Note (Signed)
 History and Physical Interval Note:  07/02/2024 8:17 AM  Jesse Key  has presented today for surgery, with the diagnosis of rectal bleeding , anemia.  The various methods of treatment have been discussed with the patient and family. After consideration of risks, benefits and other options for treatment, the patient has consented to  Procedures: COLONOSCOPY (N/A) as a surgical intervention.  The patient's history has been reviewed, patient examined, no change in status, stable for surgery.  I have reviewed the patient's chart and labs.  Questions were answered to the patient's satisfaction.    Hgb remains normal and no further bleeding since arrival (including with bowel prep)  [name]

## 2024-07-03 ENCOUNTER — Telehealth (INDEPENDENT_AMBULATORY_CARE_PROVIDER_SITE_OTHER): Payer: Self-pay

## 2024-07-03 DIAGNOSIS — Z139 Encounter for screening, unspecified: Secondary | ICD-10-CM

## 2024-07-03 NOTE — Transitions of Care (Post Inpatient/ED Visit) (Signed)
" ° °  07/03/2024  Name: Jesse Key MRN: [id number] DOB: 1966-06-18  Today's TOC FU Call Status: Today's TOC FU Call Status:: Successful TOC FU Call Completed Unsuccessful Call (1st Attempt) Date: 07/03/24 [facility] FU Call Complete Date: 07/03/24  Patient's Name and Date of Birth confirmed. Name, DOB  Transition Care Management Follow-up Telephone Call Date of Discharge: 07/02/24 Discharge Facility: [facility]) Type of Discharge: Inpatient Admission Primary Inpatient Discharge Diagnosis:: rectal hemorrhage How have you been since you were released from the hospital?: Better Any questions or concerns?: No  Items Reviewed: Did you receive and understand the discharge instructions provided?: Yes Medications obtained,verified, and reconciled?: Yes (Medications Reviewed) Any new allergies since your discharge?: No Dietary orders reviewed?: Yes Do you have support at home?: No  Medications Reviewed Today: Medications Reviewed Today     Reviewed by Emmitt Pan, LPN (Licensed Practical Nurse) on 07/03/24 at 1342  Med List Status: <None>   Medication Order Taking? Sig Documenting Provider Last Dose Status Informant  albuterol  (VENTOLIN  HFA) 108 (90 Base) MCG/ACT inhaler [id number] Yes Inhale 1-2 puffs into the lungs every 6 (six) hours as needed for wheezing or shortness of breath. Arrien, Elidia Sieving, MD  Active Self, Pharmacy Records  Digoxin  (LANOXIN ) 62.5 MCG TABS [id number] Yes Take 1 tablet by mouth daily. Arrien, Elidia Sieving, MD  Active Self, Pharmacy Records  empagliflozin  (JARDIANCE ) 10 MG TABS tablet [id number] Yes Take 1 tablet (10 mg total) by mouth daily before breakfast. Arrien, Elidia Sieving, MD  Active Self, Pharmacy Records  furosemide  (LASIX ) 40 MG tablet [id number] Yes Take 1 tablet (40 mg total) by mouth daily. Arrien, Elidia Sieving, MD  Active Self, Pharmacy Records  nicotine  (NICODERM CQ  - DOSED IN MG/24 HOURS) 14 mg/24hr patch [id number] Yes Place 1  patch (14 mg total) onto the skin daily. Elgergawy, Brayton RAMAN, MD  Active   rosuvastatin  (CRESTOR ) 20 MG tablet [id number] Yes Take 1 tablet (20 mg total) by mouth daily. Arrien, Elidia Sieving, MD  Active Self, Pharmacy Records  sacubitril -valsartan  (ENTRESTO ) 24-26 MG [id number] Yes Take 1 tablet by mouth 2 (two) times daily. Arrien, Elidia Sieving, MD  Active Self, Pharmacy Records            Home Care and Equipment/Supplies: Were Home Health Services Ordered?: NA Any new equipment or medical supplies ordered?: NA  Functional Questionnaire: Do you need assistance with bathing/showering or dressing?: No Do you need assistance with meal preparation?: No Do you need assistance with eating?: No Do you have difficulty maintaining continence: No Do you need assistance with getting out of bed/getting out of a chair/moving?: No Do you have difficulty managing or taking your medications?: No  Follow up appointments reviewed: PCP Follow-up appointment confirmed?: Yes Date of PCP follow-up appointment?: 07/18/24 Follow-up Provider: [facility] Follow-up appointment confirmed?: NA Do you need transportation to your follow-up appointment?: No Do you understand care options if your condition(s) worsen?: Yes-patient verbalized understanding    SIGNATURE Pan Emmitt, LPN [facility] Nurse Health Advisor Direct Dial [phone]  "

## 2024-07-03 NOTE — Transitions of Care (Post Inpatient/ED Visit) (Signed)
" ° °  07/03/2024  Name: Jesse Key MRN: [id number] DOB: 08-24-1965  Today's TOC FU Call Status: Today's TOC FU Call Status:: Unsuccessful Call (1st Attempt) Unsuccessful Call (1st Attempt) Date: 07/03/24  Attempted to reach the patient regarding the most recent Inpatient/ED visit.  Follow Up Plan: Additional outreach attempts will be made to reach the patient to complete the Transitions of Care (Post Inpatient/ED visit) call.   Signature Julian Lemmings, LPN [facility] Nurse Health Advisor Direct Dial [phone]  "

## 2024-07-04 ENCOUNTER — Encounter (HOSPITAL_COMMUNITY): Payer: Self-pay | Admitting: Gastroenterology

## 2024-07-04 LAB — SURGICAL PATHOLOGY

## 2024-07-05 ENCOUNTER — Telehealth (HOSPITAL_COMMUNITY): Payer: Self-pay

## 2024-07-05 NOTE — Telephone Encounter (Signed)
 Called to confirm/remind patient of their appointment at the [facility] on 07/05/24.   Appointment:   [] Confirmed  [] Left mess   [x] No answer/No voice mail  [] VM Full/unable to leave message  [] Phone not in service  Patient reminded to bring all medications and/or complete list.  Confirmed patient has transportation. Gave directions, instructed to utilize valet parking.

## 2024-07-07 ENCOUNTER — Telehealth (HOSPITAL_COMMUNITY): Payer: Self-pay | Admitting: Emergency Medicine

## 2024-07-07 NOTE — Telephone Encounter (Signed)
 Called and spoke with Jesse Key to remind him of his appointment on Monday 1/5 @ 11:30.  He advises he is aware of same.  I advised Jesse Key I will follow up with him on Tuesday to review med changes if any and set up med rec.    Mary Sharps, EMT-Paramedic [phone] 07/07/2024

## 2024-07-08 ENCOUNTER — Other Ambulatory Visit (HOSPITAL_COMMUNITY): Payer: Self-pay

## 2024-07-08 ENCOUNTER — Encounter (HOSPITAL_COMMUNITY): Payer: Self-pay

## 2024-07-08 ENCOUNTER — Emergency Department (HOSPITAL_COMMUNITY)

## 2024-07-08 ENCOUNTER — Inpatient Hospital Stay (HOSPITAL_COMMUNITY)
Admission: EM | Admit: 2024-07-08 | Discharge: 2024-07-24 | DRG: 321 | Disposition: A | Discharge status: Home / Self Care | Attending: Cardiology | Admitting: Cardiology

## 2024-07-08 ENCOUNTER — Ambulatory Visit (HOSPITAL_COMMUNITY)
Admission: RE | Admit: 2024-07-08 | Discharge: 2024-07-08 | Disposition: A | Discharge status: Home / Self Care | Source: Ambulatory Visit | Attending: Internal Medicine | Admitting: Internal Medicine

## 2024-07-08 ENCOUNTER — Other Ambulatory Visit: Payer: Self-pay

## 2024-07-08 VITALS — BP 114/72 | HR 106 | Wt 137.8 lb

## 2024-07-08 DIAGNOSIS — I447 Left bundle-branch block, unspecified: Secondary | ICD-10-CM | POA: Diagnosis not present

## 2024-07-08 DIAGNOSIS — I081 Rheumatic disorders of both mitral and tricuspid valves: Secondary | ICD-10-CM | POA: Diagnosis not present

## 2024-07-08 DIAGNOSIS — Z79899 Other long term (current) drug therapy: Secondary | ICD-10-CM | POA: Diagnosis not present

## 2024-07-08 DIAGNOSIS — F17210 Nicotine dependence, cigarettes, uncomplicated: Secondary | ICD-10-CM | POA: Insufficient documentation

## 2024-07-08 DIAGNOSIS — I2510 Atherosclerotic heart disease of native coronary artery without angina pectoris: Secondary | ICD-10-CM | POA: Diagnosis present

## 2024-07-08 DIAGNOSIS — Z8249 Family history of ischemic heart disease and other diseases of the circulatory system: Secondary | ICD-10-CM | POA: Diagnosis not present

## 2024-07-08 DIAGNOSIS — Z8616 Personal history of COVID-19: Secondary | ICD-10-CM | POA: Diagnosis not present

## 2024-07-08 DIAGNOSIS — I5022 Chronic systolic (congestive) heart failure: Secondary | ICD-10-CM | POA: Insufficient documentation

## 2024-07-08 DIAGNOSIS — Z5902 Unsheltered homelessness: Secondary | ICD-10-CM | POA: Insufficient documentation

## 2024-07-08 DIAGNOSIS — N1832 Chronic kidney disease, stage 3b: Secondary | ICD-10-CM | POA: Insufficient documentation

## 2024-07-08 DIAGNOSIS — Z556 Problems related to health literacy: Secondary | ICD-10-CM | POA: Insufficient documentation

## 2024-07-08 DIAGNOSIS — Z955 Presence of coronary angioplasty implant and graft: Secondary | ICD-10-CM

## 2024-07-08 DIAGNOSIS — R079 Chest pain, unspecified: Secondary | ICD-10-CM | POA: Diagnosis present

## 2024-07-08 DIAGNOSIS — Z139 Encounter for screening, unspecified: Secondary | ICD-10-CM

## 2024-07-08 DIAGNOSIS — Z5941 Food insecurity: Secondary | ICD-10-CM | POA: Diagnosis not present

## 2024-07-08 DIAGNOSIS — I5023 Acute on chronic systolic (congestive) heart failure: Secondary | ICD-10-CM | POA: Insufficient documentation

## 2024-07-08 DIAGNOSIS — Z7982 Long term (current) use of aspirin: Secondary | ICD-10-CM | POA: Insufficient documentation

## 2024-07-08 DIAGNOSIS — N1831 Chronic kidney disease, stage 3a: Secondary | ICD-10-CM | POA: Diagnosis present

## 2024-07-08 DIAGNOSIS — Z5982 Transportation insecurity: Secondary | ICD-10-CM | POA: Diagnosis not present

## 2024-07-08 DIAGNOSIS — I214 Non-ST elevation (NSTEMI) myocardial infarction: Secondary | ICD-10-CM

## 2024-07-08 DIAGNOSIS — I10 Essential (primary) hypertension: Secondary | ICD-10-CM | POA: Diagnosis present

## 2024-07-08 DIAGNOSIS — I209 Angina pectoris, unspecified: Principal | ICD-10-CM

## 2024-07-08 DIAGNOSIS — Z59868 Other specified financial insecurity: Secondary | ICD-10-CM | POA: Diagnosis not present

## 2024-07-08 DIAGNOSIS — R7989 Other specified abnormal findings of blood chemistry: Secondary | ICD-10-CM | POA: Diagnosis present

## 2024-07-08 DIAGNOSIS — F1010 Alcohol abuse, uncomplicated: Secondary | ICD-10-CM | POA: Diagnosis present

## 2024-07-08 DIAGNOSIS — R7401 Elevation of levels of liver transaminase levels: Secondary | ICD-10-CM

## 2024-07-08 DIAGNOSIS — I509 Heart failure, unspecified: Secondary | ICD-10-CM

## 2024-07-08 LAB — COMPREHENSIVE METABOLIC PANEL WITH GFR
ALT: 27 U/L (ref 0–44)
AST: 68 U/L — ABNORMAL HIGH (ref 15–41)
Albumin: 3.9 g/dL (ref 3.5–5.0)
Alkaline Phosphatase: 79 U/L (ref 38–126)
Anion gap: 15 (ref 5–15)
BUN: 20 mg/dL (ref 6–20)
CO2: 20 mmol/L — ABNORMAL LOW (ref 22–32)
Calcium: 9.1 mg/dL (ref 8.9–10.3)
Chloride: 103 mmol/L (ref 98–111)
Creatinine, Ser: 1.37 mg/dL — ABNORMAL HIGH (ref 0.61–1.24)
GFR, Estimated: 59 mL/min — ABNORMAL LOW
Glucose, Bld: 92 mg/dL (ref 70–99)
Potassium: 4.5 mmol/L (ref 3.5–5.1)
Sodium: 138 mmol/L (ref 135–145)
Total Bilirubin: 0.4 mg/dL (ref 0.0–1.2)
Total Protein: 7.3 g/dL (ref 6.5–8.1)

## 2024-07-08 LAB — CBC
HCT: 38.5 % — ABNORMAL LOW (ref 39.0–52.0)
Hemoglobin: 12.1 g/dL — ABNORMAL LOW (ref 13.0–17.0)
MCH: 23.6 pg — ABNORMAL LOW (ref 26.0–34.0)
MCHC: 31.4 g/dL (ref 30.0–36.0)
MCV: 75.2 fL — ABNORMAL LOW (ref 80.0–100.0)
Platelets: 211 K/uL (ref 150–400)
RBC: 5.12 MIL/uL (ref 4.22–5.81)
RDW: 16 % — ABNORMAL HIGH (ref 11.5–15.5)
WBC: 14.2 K/uL — ABNORMAL HIGH (ref 4.0–10.5)
nRBC: 0 % (ref 0.0–0.2)

## 2024-07-08 MED ORDER — METOPROLOL SUCCINATE ER 25 MG PO TB24
12.5000 mg | ORAL_TABLET | Freq: Every evening | ORAL | 3 refills | Status: DC
  Filled 2024-07-08: qty 45, 90d supply, fill #0

## 2024-07-08 MED ORDER — SPIRONOLACTONE 25 MG PO TABS
12.5000 mg | ORAL_TABLET | Freq: Every day | ORAL | 3 refills | Status: DC
  Filled 2024-07-08: qty 45, 90d supply, fill #0

## 2024-07-08 NOTE — Patient Instructions (Signed)
 Thank you for coming in today  If you had labs drawn today, any labs that are abnormal the clinic will call you No news is good news  Medications: Start Spironolactone  12.5 mg 1/2 tablet daily Start Toprol  XL 12.5 mg nightly  Follow up appointments:  Your physician recommends that you schedule a follow-up appointment in:  3 weeks in clinic 1 week lab BMET pro BNP   Do the following things EVERYDAY: Weigh yourself in the morning before breakfast. Write it down and keep it in a log. Take your medicines as prescribed Eat low salt foods--Limit salt (sodium) to 2000 mg per day.  Stay as active as you can everyday Limit all fluids for the day to less than 2 liters   At the [facility], you and your health needs are our priority. As part of our continuing mission to provide you with exceptional heart care, we have created designated Provider Care Teams. These Care Teams include your primary Cardiologist (physician) and Advanced Practice Providers (APPs- Physician Assistants and Nurse Practitioners) who all work together to provide you with the care you need, when you need it.   You may see any of the following providers on your designated Care Team at your next follow up: Dr Toribio Fuel Dr Ezra Shuck Dr. Ria Gardenia Greig Lenetta, NP Caffie Shed, [place] [facility] [place], [place] Beckey Coe, NP Tinnie Redman, PharmD   Please be sure to bring in all your medications bottles to every appointment.    Thank you for choosing [facility]  If you have any questions or concerns before your next appointment please send us  a message through [place] or call our office at [phone].    TO LEAVE A MESSAGE FOR THE NURSE SELECT OPTION 2, PLEASE LEAVE A MESSAGE INCLUDING: YOUR NAME DATE OF BIRTH CALL BACK NUMBER REASON FOR CALL**this is important as we prioritize the call backs  YOU WILL RECEIVE A CALL BACK THE  SAME DAY AS LONG AS YOU CALL BEFORE 4:00 PM

## 2024-07-08 NOTE — ED Triage Notes (Signed)
 C/O SOB & intermittent left sided CP starting yesterday. Reports coughing x 2 days, productive green/white phlegm. Endorses dizziness with chest pain.   Denies fever, nausea, vomiting.   Pain 9/10   P102 110/0 palp 18

## 2024-07-08 NOTE — Progress Notes (Signed)
 H&V Care Navigation CSW Progress Note  Clinical Social Worker met with patient to discuss SDOH concerns.  Currently homeless and has been staying [facility] when its cold.  Is familiar with community resources for shelter/homelessness support and free meals- has been visiting [facility] and is on waitlist for [employer].  Has no source of income but disability determination has been made- should be receiving notice in the mail regarding their decision so hopeful to have income soon.  Provided pt with pants, sweater and shirt to assist with warmer clothing as he will likely have to spend the night outside since it will not be cold enough to activate [facility].  Provided pt with bus passes to more easily navigate [place].   SDOH Screenings   Food Insecurity: Food Insecurity Present (07/01/2024)  Housing: High Risk (07/08/2024)  Transportation Needs: Unmet Transportation Needs (07/01/2024)  Utilities: At Risk (07/01/2024)  Depression (PHQ2-9): Medium Risk (06/04/2024)  Financial Resource Strain: High Risk (04/12/2024)  Stress: Stress Concern Present (06/04/2024)  Tobacco Use: High Risk (07/08/2024)  Health Literacy: Adequate Health Literacy (01/03/2024)    Will continue to follow through clinic and assist as needed  Carmelo Reidel H. Corgan Mormile, LCSW Clinical Social Worker [facility] Desk#: [phone] Cell#: [phone]

## 2024-07-08 NOTE — ED Provider Triage Note (Signed)
 Emergency Medicine Provider Triage Evaluation Note  Jesse Key , a 59 y.o. male w/ h/o heart failure, CKD nonobstructive CAD, tobacco use, prior alcohol abuse, currently homeless, who was evaluated in triage.  Pt complains of CP/SOB that started yesterday. Has happened in the past but not as severe as this. Is homeless and trying to find a place to keep warm and reports walking a lot with a large bag which makes it worse. Echo EF <20% 06/24/24. No h/o MI.   Review of Systems  Positive: +Rhinorrhea, mild cough. Negative: No leg swelling, h/o DVT/PE,   Physical Exam  BP 117/81 (BP Location: Right Arm)   Pulse (!) 114   Temp 97.7 F (36.5 C) (Oral)   Resp 16   Ht 5' 7 (1.702 m)   Wt 61.2 kg   SpO2 97%   BMI 21.14 kg/m  Gen:   Awake, no distress   Resp:  Normal effort, intermittent dry cough MSK:   Moves extremities without difficulty  Other:  No LEE  Medical Decision Making  Medically screening exam initiated at 10:42 PM.  Appropriate orders placed.  Jesse Key was informed that the remainder of the evaluation will be completed by another provider, this initial triage assessment does not replace that evaluation, and the importance of remaining in the ED until their evaluation is complete.  Labs/orders placed. Patient will be moved to treatment space when one becomes available.   Jesse Sid SAILOR, MD 07/08/24 2245

## 2024-07-09 ENCOUNTER — Emergency Department (HOSPITAL_COMMUNITY)

## 2024-07-09 ENCOUNTER — Telehealth: Payer: Self-pay | Admitting: Licensed Clinical Social Worker

## 2024-07-09 ENCOUNTER — Other Ambulatory Visit: Payer: Self-pay | Admitting: Licensed Clinical Social Worker

## 2024-07-09 DIAGNOSIS — I5023 Acute on chronic systolic (congestive) heart failure: Secondary | ICD-10-CM

## 2024-07-09 DIAGNOSIS — R7401 Elevation of levels of liver transaminase levels: Secondary | ICD-10-CM | POA: Insufficient documentation

## 2024-07-09 DIAGNOSIS — R7989 Other specified abnormal findings of blood chemistry: Secondary | ICD-10-CM | POA: Diagnosis not present

## 2024-07-09 LAB — CBC
HCT: 37.5 % — ABNORMAL LOW (ref 39.0–52.0)
Hemoglobin: 11.8 g/dL — ABNORMAL LOW (ref 13.0–17.0)
MCH: 23.6 pg — ABNORMAL LOW (ref 26.0–34.0)
MCHC: 31.5 g/dL (ref 30.0–36.0)
MCV: 75 fL — ABNORMAL LOW (ref 80.0–100.0)
Platelets: 209 K/uL (ref 150–400)
RBC: 5 MIL/uL (ref 4.22–5.81)
RDW: 16 % — ABNORMAL HIGH (ref 11.5–15.5)
WBC: 14.7 K/uL — ABNORMAL HIGH (ref 4.0–10.5)
nRBC: 0 % (ref 0.0–0.2)

## 2024-07-09 LAB — HEPARIN LEVEL (UNFRACTIONATED)
Heparin Unfractionated: <0.1 [IU]/mL — ABNORMAL LOW (ref 0.30–0.70)
Heparin Unfractionated: <0.1 [IU]/mL — ABNORMAL LOW (ref 0.30–0.70)

## 2024-07-09 LAB — URINE DRUG SCREEN
Amphetamines: NEGATIVE
Barbiturates: NEGATIVE
Benzodiazepines: NEGATIVE
Cocaine: POSITIVE — AB
Fentanyl: NEGATIVE
Methadone Scn, Ur: NEGATIVE
Opiates: POSITIVE — AB
Tetrahydrocannabinol: NEGATIVE

## 2024-07-09 LAB — TROPONIN T, HIGH SENSITIVITY
Troponin T High Sensitivity: 1513 ng/L (ref 0–19)
Troponin T High Sensitivity: 1613 ng/L (ref 0–19)
Troponin T High Sensitivity: 2107 ng/L (ref 0–19)

## 2024-07-09 LAB — DIGOXIN LEVEL: Digoxin Level: 0.8 ng/mL (ref 0.8–2.0)

## 2024-07-09 LAB — PRO BRAIN NATRIURETIC PEPTIDE: Pro Brain Natriuretic Peptide: 8698 pg/mL — ABNORMAL HIGH

## 2024-07-09 LAB — MAGNESIUM: Magnesium: 2.2 mg/dL (ref 1.7–2.4)

## 2024-07-09 MED ORDER — LORAZEPAM 1 MG PO TABS: 1.0000 mg | ORAL_TABLET | ORAL | Status: DC | PRN

## 2024-07-09 MED ORDER — THIAMINE MONONITRATE 100 MG PO TABS
100.0000 mg | ORAL_TABLET | Freq: Every day | ORAL | Status: DC
  Administered 2024-07-09 – 2024-07-24 (×15): 100 mg via ORAL
  Filled 2024-07-09 (×15): qty 1

## 2024-07-09 MED ORDER — LORAZEPAM 2 MG/ML IJ SOLN: 1.0000 mg | INTRAMUSCULAR | Status: DC | PRN

## 2024-07-09 MED ORDER — HEPARIN BOLUS VIA INFUSION
2000.0000 [IU] | Freq: Once | INTRAVENOUS | Status: AC
  Administered 2024-07-09: 2000 [IU] via INTRAVENOUS
  Filled 2024-07-09: qty 2000

## 2024-07-09 MED ORDER — NITROGLYCERIN 2 % TD OINT
1.0000 [in_us] | TOPICAL_OINTMENT | Freq: Once | TRANSDERMAL | Status: AC
  Administered 2024-07-09: 1 [in_us] via TOPICAL
  Filled 2024-07-09: qty 1

## 2024-07-09 MED ORDER — SACUBITRIL-VALSARTAN 24-26 MG PO TABS
1.0000 | ORAL_TABLET | Freq: Two times a day (BID) | ORAL | Status: DC
  Administered 2024-07-09 (×2): 1 via ORAL
  Filled 2024-07-09 (×4): qty 1

## 2024-07-09 MED ORDER — ACETAMINOPHEN 650 MG RE SUPP: 650.0000 mg | Freq: Four times a day (QID) | RECTAL | Status: DC | PRN

## 2024-07-09 MED ORDER — HEPARIN (PORCINE) 25000 UT/250ML-% IV SOLN
1250.0000 [IU]/h | INTRAVENOUS | Status: DC
  Administered 2024-07-09: 1100 [IU]/h via INTRAVENOUS
  Administered 2024-07-09: 700 [IU]/h via INTRAVENOUS
  Administered 2024-07-10: 1250 [IU]/h via INTRAVENOUS
  Filled 2024-07-09 (×3): qty 250

## 2024-07-09 MED ORDER — ACETAMINOPHEN 325 MG PO TABS
650.0000 mg | ORAL_TABLET | Freq: Four times a day (QID) | ORAL | Status: DC | PRN
  Administered 2024-07-11: 650 mg via ORAL
  Filled 2024-07-09: qty 2

## 2024-07-09 MED ORDER — MORPHINE SULFATE (PF) 4 MG/ML IV SOLN
4.0000 mg | Freq: Once | INTRAVENOUS | Status: AC
  Administered 2024-07-09: 4 mg via INTRAVENOUS
  Filled 2024-07-09: qty 1

## 2024-07-09 MED ORDER — ASPIRIN 81 MG PO CHEW
81.0000 mg | CHEWABLE_TABLET | ORAL | Status: AC
  Administered 2024-07-10: 81 mg via ORAL
  Filled 2024-07-09: qty 1

## 2024-07-09 MED ORDER — ADULT MULTIVITAMIN W/MINERALS CH
1.0000 | ORAL_TABLET | Freq: Every day | ORAL | Status: DC
  Administered 2024-07-09 – 2024-07-24 (×15): 1 via ORAL
  Filled 2024-07-09 (×15): qty 1

## 2024-07-09 MED ORDER — SODIUM CHLORIDE 0.9% FLUSH: 3.0000 mL | INTRAVENOUS | Status: DC | PRN

## 2024-07-09 MED ORDER — FOLIC ACID 1 MG PO TABS
1.0000 mg | ORAL_TABLET | Freq: Every day | ORAL | Status: DC
  Administered 2024-07-09 – 2024-07-24 (×15): 1 mg via ORAL
  Filled 2024-07-09 (×15): qty 1

## 2024-07-09 MED ORDER — FUROSEMIDE 10 MG/ML IJ SOLN
40.0000 mg | Freq: Once | INTRAMUSCULAR | Status: AC
  Administered 2024-07-09: 40 mg via INTRAVENOUS
  Filled 2024-07-09: qty 4

## 2024-07-09 MED ORDER — ROSUVASTATIN CALCIUM 20 MG PO TABS
20.0000 mg | ORAL_TABLET | Freq: Every day | ORAL | Status: DC
  Administered 2024-07-09 – 2024-07-24 (×15): 20 mg via ORAL
  Filled 2024-07-09 (×15): qty 1

## 2024-07-09 MED ORDER — SODIUM CHLORIDE 0.9% FLUSH
3.0000 mL | Freq: Two times a day (BID) | INTRAVENOUS | Status: DC
  Administered 2024-07-09 – 2024-07-11 (×4): 3 mL via INTRAVENOUS

## 2024-07-09 MED ORDER — LEVALBUTEROL HCL 0.63 MG/3ML IN NEBU: 0.6300 mg | INHALATION_SOLUTION | Freq: Four times a day (QID) | RESPIRATORY_TRACT | Status: DC | PRN

## 2024-07-09 MED ORDER — SPIRONOLACTONE 12.5 MG HALF TABLET
12.5000 mg | ORAL_TABLET | Freq: Every day | ORAL | Status: DC
  Administered 2024-07-09 – 2024-07-10 (×2): 12.5 mg via ORAL
  Filled 2024-07-09 (×2): qty 1

## 2024-07-09 MED ORDER — SODIUM CHLORIDE 0.9 % IV SOLN: 250.0000 mL | INTRAVENOUS | Status: AC | PRN

## 2024-07-09 MED ORDER — HEPARIN BOLUS VIA INFUSION
3600.0000 [IU] | Freq: Once | INTRAVENOUS | Status: AC
  Administered 2024-07-09: 3600 [IU] via INTRAVENOUS
  Filled 2024-07-09: qty 3600

## 2024-07-09 MED ORDER — EMPAGLIFLOZIN 10 MG PO TABS
10.0000 mg | ORAL_TABLET | Freq: Every day | ORAL | Status: DC
  Administered 2024-07-09 – 2024-07-24 (×16): 10 mg via ORAL
  Filled 2024-07-09 (×15): qty 1

## 2024-07-09 MED ORDER — DIGOXIN 0.0625 MG HALF TABLET
0.0625 mg | ORAL_TABLET | Freq: Every day | ORAL | Status: DC
  Administered 2024-07-09 – 2024-07-24 (×15): 0.0625 mg via ORAL
  Filled 2024-07-09 (×16): qty 1

## 2024-07-09 MED ORDER — THIAMINE HCL 100 MG/ML IJ SOLN
100.0000 mg | Freq: Every day | INTRAMUSCULAR | Status: DC
  Filled 2024-07-09: qty 2

## 2024-07-09 MED ORDER — HEPARIN BOLUS VIA INFUSION
2000.0000 [IU] | Freq: Once | INTRAVENOUS | Status: DC
  Filled 2024-07-09: qty 2000

## 2024-07-09 MED ORDER — ASPIRIN 81 MG PO CHEW
324.0000 mg | CHEWABLE_TABLET | Freq: Once | ORAL | Status: AC
  Administered 2024-07-09: 324 mg via ORAL
  Filled 2024-07-09: qty 4

## 2024-07-09 MED ORDER — METOPROLOL SUCCINATE ER 25 MG PO TB24: 12.5000 mg | ORAL_TABLET | Freq: Every day | ORAL | Status: DC

## 2024-07-09 MED ORDER — IOHEXOL 350 MG/ML SOLN
75.0000 mL | Freq: Once | INTRAVENOUS | Status: AC | PRN
  Administered 2024-07-09: 75 mL via INTRAVENOUS

## 2024-07-09 NOTE — Progress Notes (Signed)
 PHARMACY - ANTICOAGULATION CONSULT NOTE  Pharmacy Consult for heparin  Indication: chest pain/ACS  Allergies[1]  Patient Measurements: Height: 5' 6 (167.6 cm) Weight: 59.9 kg (132 lb) IBW/kg (Calculated) : 63.8 HEPARIN  DW (KG): 59.9  Vital Signs: Temp: 99 F (37.2 C) (01/06 1910) Temp Source: Oral (01/06 1910) BP: 115/80 (01/06 1910) Pulse Rate: 115 (01/06 1910)  Labs: Recent Labs    07/08/24 2224 07/09/24 1100 07/09/24 1134 07/09/24 1847  HGB 12.1* 11.8*  --   --   HCT 38.5* 37.5*  --   --   PLT 211 209  --   --   HEPARINUNFRC  --   --  <0.10* <0.10*  CREATININE 1.37*  --   --   --     Estimated Creatinine Clearance: 48.6 mL/min (A) (by C-G formula based on SCr of 1.37 mg/dL (H)).   Medical History: Past Medical History:  Diagnosis Date   Asthma    Shellfish allergy    Assessment: 18 yoM presented with intermittent LS chest pain and SOB. Pharmacy consulted to dose heparin  for ACS.  -No PTA oral anticoagulation -Trop 1600  Heparin  level still subtherapeutic this PM. We will rebolus and increase rate. Check in AM.   Goal of Therapy:  Heparin  level 0.3-0.7 units/ml Monitor platelets by anticoagulation protocol: Yes   Plan:  Give 2000 units bolus x 1 Increase heparin  infusion at 1100 units/hr Check anti-Xa level in AM and daily while on heparin  Continue to monitor H&H and platelets F/u cards eval  Sergio Batch, PharmD, BCIDP, AAHIVP, CPP Infectious Disease Pharmacist 07/09/2024 8:59 PM            [1]  Allergies Allergen Reactions   Shrimp [Shellfish Allergy] Anaphylaxis and Itching    ALL SHELLFISH   Shellfish Allergy Itching

## 2024-07-09 NOTE — Progress Notes (Signed)
 Heart Failure Navigator Progress Note  Assessed for Heart & Vascular TOC clinic readiness.  Patient does not meet criteria due to he has an Advanced Heart Failure Team consult with Dr. Bensimhon. .   Navigator will sing off at this time.   Stephane Haddock, BSN, Scientist, Clinical (histocompatibility And Immunogenetics) Only

## 2024-07-09 NOTE — TOC CM/SW Note (Signed)
 TOC consult received for substance abuse. Follow-up to be completed with patient as appropriate.   Also, see SW note from 1/5. Patient appears to currently be homeless, and has been given resources.   Merilee Batty, MSN, RN Case Management [phone]

## 2024-07-09 NOTE — Consult Note (Addendum)
 "   Advanced Heart Failure Team Consult Note   Primary Physician: Celestia Rosaline SQUIBB, NP Cardiologist:  Soyla DELENA Merck, MD HPI:    CAGE GUPTON is seen today for evaluation of elevated troponins and SOB at the request of Dr. Alfornia.   DIMITRIY CARRERAS is a 59 y.o. male with tobacco, cocaine and alcohol use, chronic systolic heart failure.    Admitted 5/25 with new acute systolic heart failure. Echo showed EF < 20%, normal RV, global HK, mid-mod MR. Diuresed well with IV lasix  and he was started on GDMT. R/LHC showed non-obstructive CAD, EF 25%, well compensated filling pressures with moderate to severely reduced CO (PCW 7, CI 2.07). cMRI showed LVEF 18%, mod dilated LV with septal-lateral dyssynchrony consistent with LBBB and diffuse severe HK. Normal RV, RVEF 35%. Mild-mod MR. Concern for LBBB CM. Discharged, weight 126.5 lbs.   Patient presented to clinic 9/29 for routine follow-up and was sent to the ED to progressive HF symptoms including dyspnea at rest. Symptoms started after contracting COVID 2 weeks prior. During that time he had stopped taking some of his meds and was drinking a lot of fluid. BNP > 4500. He was admitted and diuresed by Advanced Heart Failure service. GDMT titrated.   Admitted 06/23/24 with A/C HFrEF. Diuresed with IV lasix , required brief milrinone . UDS + cocaine. Echo 12/25: EF <20%, LV with GHK, RV normal, LA mod dilated, mild MR   Readmitted 06/30/24 with BRBPR 2/2 internal hemorrhoids. GI saw, colonoscopy showed small colon polyp and external hemorrhoids. Stable at discharge   I personally saw patient in AHF clinic yesterday. Denied CP and SOB at that time. His biggest concern yesterday was finding his missing belongings. Presented to the ED later that afternoon reporting CP that started yesterday afternoon. Sudden onset after walking around with his large bag of belongings. Admission labs reviewed: K 4.5, SCr 1.37, HsTrop 1.5K> 1.6K >2.1K. ProBNP elevated  at 8.6K.  Chest pain 8/10 during assessment, reproducible on exam. UDS positive for cocaine and opitaes. Denies use of both. 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 Reports SOB with activity, especially when carrying around his belongings.    Home Medications Prior to Admission medications  Medication Sig Start Date End Date Taking? Authorizing Provider  albuterol  (VENTOLIN  HFA) 108 (90 Base) MCG/ACT inhaler Inhale 1-2 puffs into the lungs every 6 (six) hours as needed for wheezing or shortness of breath. 06/28/24   Arrien, Elidia Sieving, MD  Digoxin  (LANOXIN ) 62.5 MCG TABS Take 1 tablet by mouth daily. 06/29/24   Arrien, Elidia Sieving, MD  empagliflozin  (JARDIANCE ) 10 MG TABS tablet Take 1 tablet (10 mg total) by mouth daily before breakfast. 06/28/24   Arrien, Elidia Sieving, MD  furosemide  (LASIX ) 40 MG tablet Take 1 tablet (40 mg total) by mouth daily. 06/28/24   Arrien, Mauricio Daniel, MD  metoprolol  succinate (TOPROL  XL) 25 MG 24 hr tablet Take 1/2 tablets (12.5 mg total) by mouth at bedtime. 07/08/24   Hayes Beckey CROME, NP  nicotine  (NICODERM CQ  - DOSED IN MG/24 HOURS) 14 mg/24hr patch Place 1 patch (14 mg total) onto the skin daily. Patient not taking: Reported on 07/08/2024 07/02/24   Elgergawy, Brayton RAMAN, MD  rosuvastatin  (CRESTOR ) 20 MG tablet Take 1 tablet (20 mg total) by mouth daily. 06/28/24   Arrien, Elidia Sieving, MD  sacubitril -valsartan  (ENTRESTO ) 24-26 MG Take 1 tablet by mouth 2 (two) times  daily. 06/28/24   Arrien, Mauricio Daniel, MD  spironolactone  (ALDACTONE ) 25 MG tablet Take 1/2 tablets (12.5 mg total) by mouth daily. 07/08/24 10/06/24  Hayes Beckey CROME, NP    Past Medical History: Past Medical History:  Diagnosis Date   Asthma    Shellfish allergy     Past Surgical History: Past Surgical History:  Procedure Laterality Date   COLONOSCOPY N/A 07/02/2024   Procedure: COLONOSCOPY;  Surgeon: Legrand Victory CROME DOUGLAS, MD;  Location: [facility] ENDOSCOPY;  Service: Gastroenterology;  Laterality: N/A;   POLYPECTOMY  07/02/2024   Procedure: POLYPECTOMY, INTESTINE;  Surgeon: Legrand Victory CROME DOUGLAS, MD;  Location: [facility] ENDOSCOPY;  Service: Gastroenterology;;   RIGHT/LEFT HEART CATH AND CORONARY ANGIOGRAPHY N/A 11/24/2023   Procedure: RIGHT/LEFT HEART CATH AND CORONARY ANGIOGRAPHY;  Surgeon: Cherrie Toribio SAUNDERS, MD;  Location: [facility] INVASIVE CV LAB;  Service: Cardiovascular;  Laterality: N/A;    Family History: Family History  Problem Relation Age of Onset   Diabetes Mother    Hypertension Mother    Diabetes Father    Hypertension Father     Social History: Social History   Socioeconomic History   Marital status: Single    Spouse name: Not on file   Number of children: Not on file   Years of education: Not on file   Highest education level: Not on file  Occupational History   Not on file  Tobacco Use   Smoking status: Some Days    Current packs/day: 0.25    Types: Cigarettes   Smokeless tobacco: Never  Vaping Use   Vaping status: Never Used  Substance and Sexual Activity   Alcohol use: Yes    Comment: weekends only   Drug use: No   Sexual activity: Not on file  Other Topics Concern   Not on file  Social History Narrative   ** Merged History Encounter **       Homeless at the time may smoke one cigarette a day   Social Drivers of Health   Tobacco Use: High Risk (07/08/2024)   Patient History    Smoking Tobacco Use: Some Days    Smokeless Tobacco Use: Never    Passive Exposure: Not on file  Financial Resource Strain: High Risk (04/12/2024)   Overall Financial Resource Strain (CARDIA)    Difficulty of Paying Living Expenses: Hard  Food Insecurity: Food Insecurity Present (07/01/2024)   Epic    Worried About Programme Researcher, Broadcasting/film/video in the Last Year: Often true    Ran Out of Food in the Last Year: Often true  Transportation Needs: Unmet Transportation Needs (07/01/2024)   Epic    Lack of  Transportation (Medical): Yes    Lack of Transportation (Non-Medical): Yes  Physical Activity: Not on file  Stress: Stress Concern Present (06/04/2024)   [employer] of Occupational Health - Occupational Stress Questionnaire    Feeling of Stress: To some extent  Social Connections: Not on file  Depression (PHQ2-9): Medium Risk (06/04/2024)   Depression (PHQ2-9)    PHQ-2 Score: 9  Alcohol Screen: Not on file  Housing: High Risk (07/08/2024)   Epic    Unable to Pay for Housing in the Last Year: Yes    Number of Times Moved in the Last Year: 4    Homeless in the Last Year: Yes  Utilities: At Risk (07/01/2024)   Epic    Threatened with loss of utilities: Yes  Health Literacy: Adequate Health Literacy (01/03/2024)   B1300 Health Literacy  Frequency of need for help with medical instructions: Rarely    Allergies:  Allergies[1]  Objective:   Vital Signs:   Temp:  [97.7 F (36.5 C)-98.2 F (36.8 C)] 98.2 F (36.8 C) (01/06 0742) Pulse Rate:  [100-115] 100 (01/06 0845) Resp:  [16-23] 18 (01/06 0845) BP: (109-117)/(74-82) 109/77 (01/06 0845) SpO2:  [95 %-100 %] 98 % (01/06 0845) Weight:  [61.2 kg] 61.2 kg (01/05 2222)    Weight change: Filed Weights   07/08/24 2222  Weight: 61.2 kg    Intake/Output:   Intake/Output Summary (Last 24 hours) at 07/09/2024 0952 Last data filed at 07/09/2024 0700 Gross per 24 hour  Intake 65.21 ml  Output 1100 ml  Net -1034.79 ml    Physical Exam  General:  well appearing.   Cor: Tachy/regular rate & rhythm. No murmurs. JVD does not appear elevated.  Lungs: clear, diminished bases Extremities: no edema   Telemetry   ST low 100s (Personally reviewed)    EKG    ST   Labs   Basic Metabolic Panel: Recent Labs  Lab 07/08/24 2224  NA 138  K 4.5  CL 103  CO2 20*  GLUCOSE 92  BUN 20  CREATININE 1.37*  CALCIUM  9.1    Liver Function Tests: Recent Labs  Lab 07/08/24 2224  AST 68*  ALT 27  ALKPHOS 79  BILITOT 0.4  PROT  7.3  ALBUMIN 3.9   No results for input(s): LIPASE, AMYLASE in the last 168 hours. No results for input(s): AMMONIA in the last 168 hours.  CBC: Recent Labs  Lab 07/08/24 2224  WBC 14.2*  HGB 12.1*  HCT 38.5*  MCV 75.2*  PLT 211    Cardiac Enzymes: No results for input(s): CKTOTAL, CKMB, CKMBINDEX, TROPONINI in the last 168 hours.  BNP: BNP (last 3 results) Recent Labs    04/01/24 1327 04/12/24 1014 05/21/24 1004  BNP >4,500.0* 3,217.5* 3,013.7*    ProBNP (last 3 results) Recent Labs    06/23/24 2241 06/30/24 1232 07/08/24 2224  PROBNP 11,603.0* 3,740.0* 8,698.0*     CBG: No results for input(s): GLUCAP in the last 168 hours.  Coagulation Studies: No results for input(s): LABPROT, INR in the last 72 hours.   Imaging   CT Angio Chest PE W and/or Wo Contrast Result Date: 07/09/2024 EXAM: CTA CHEST 07/09/2024 01:35:16 AM TECHNIQUE: CTA of the chest was performed after the administration of 75 mL of iohexol  (OMNIPAQUE ) 350 MG/ML injection. Multiplanar reformatted images are provided for review. MIP images are provided for review. Automated exposure control, iterative reconstruction, and/or weight based adjustment of the mA/kV was utilized to reduce the radiation dose to as low as reasonably achievable. COMPARISON: Chest radiograph 07/08/2024 and CT chest 06/30/2024. CLINICAL HISTORY: Pulmonary embolism (PE) suspected, high prob. FINDINGS: PULMONARY ARTERIES: Pulmonary arteries are adequately opacified for evaluation. No acute pulmonary embolus. Main pulmonary artery is normal in caliber. MEDIASTINUM: The heart demonstrates no acute abnormality. No pericardial effusions. Reflux of contrast material into the hepatic veins suggests right heart failure. There is no acute abnormality of the thoracic aorta. LYMPH NODES: No mediastinal, hilar or axillary lymphadenopathy. LUNGS AND PLEURA: Mild dependent atelectasis in the lung bases. No focal consolidation or  pulmonary edema. No evidence of pleural effusion or pneumothorax. UPPER ABDOMEN: Limited images of the upper abdomen demonstrate a cyst in the upper pole of the left kidney, unchanged since the prior study. No imaging follow-up is indicated. SOFT TISSUES AND BONES: Mild motion artifact. Mild degenerative changes in  the spine. No acute soft tissue abnormality. IMPRESSION: 1. No pulmonary embolism. 2. Cardiac enlargement with Reflux of contrast material into the hepatic veins suggests right heart failure. Electronically signed by: Elsie Gravely MD 07/09/2024 01:48 AM EST RP Workstation: [id number]   DG Chest 2 View Result Date: 07/08/2024 CLINICAL DATA:  Shortness of breath and chest pain EXAM: CHEST - 2 VIEW COMPARISON:  06/30/2024 FINDINGS: Mild cardiomegaly. No acute airspace disease or effusion. No pneumothorax. Zipper artifact over the upper chest IMPRESSION: No active cardiopulmonary disease. Mild cardiomegaly. Electronically Signed   By: Luke Bun M.D.   On: 07/08/2024 22:54    Medications:   Current Medications:  digoxin   0.0625 mg Oral Daily   empagliflozin   10 mg Oral QAC breakfast   folic acid   1 mg Oral Daily   multivitamin with minerals  1 tablet Oral Daily   rosuvastatin   20 mg Oral Daily   sacubitril -valsartan   1 tablet Oral BID   spironolactone   12.5 mg Oral Daily   thiamine   100 mg Oral Daily   Or   thiamine   100 mg Intravenous Daily    Infusions:  heparin  700 Units/hr (07/09/24 0740)   Patient Profile   Hannan J Jaggi is a 59 y.o. male with  tobacco, cocaine and alcohol use, chronic systolic heart failure. Admitted with elevated troponins and SOB.   Assessment/Plan   Elevated Troponin CAD - Nonobstructive CAD on 7/25 cath.   - 8/10 reproducible CP on exam - HsTrop elevated but relatively flat - Continue hep gtt - Continue ASA, statin.   Acute on chronic systolic CHF: Nonischemic cardiomyopathy. Echo in 5/25 showed EF < 20%, RV nl, global HK, mid-mod MR.  RHC/LHC in 5/25 showed non-obstructive CAD, well-compensated filling pressures with CI 2.07.  Cardiac MRI in 5/25 showed LV EF 18%, RV EF 35%, nonspecific RV insertion site LGE.  Echo 9/25: EF 15-20%, RV mildly reduced, RVSP 55 mmHg, severe MR, severe TR, dilated IVC.  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 Echo 12/25: EF <20%, LV with GHK, RV normal, LA mod dilated, mild MR - NYHA II. Appears euvolemic on exam but Pro-BNP slightly elevated.  - Got 40 IV lasix  this morning. Will repeat this afternoon and transition back to PO tomorrow.  - Continue digoxin  0.0625 daily. 0.8 on admission.   - Continue Jardiance  10 daily - Will use coreg when BB restarted. Monitor closely with recurrent cocaine use.  - Continue spironolactone  12.5 daily, Cr bumped with 25mg .  - continue Entresto  24/26 bid - Compliance much improved with Paramedicine. He finds Dede, paramedic very helpful. - EF remains <35% will need ICD, with LBBB may be candidate for CRT-D. Has been referred to EP though ongoing cocaine use may be a problem.    LBBB - QRS only 148 ms on ECG   CKD stage 3b: Baseline ~1.5-1.6 - 1.37 today, follow with diuresis - Avoid hypotension  Cocaine use Tobacco use ETOH use - Recently drank ETOH again because he was stressed.  - Denies cocaine use, only touches it to make money - UDS + cocaine and opiates - Smokes ~3 cigarettes a day - Cessation of all advised   SDOH:  - poor health literacy - Enrolled in paramedicine, compliance much improved - May eventually need to consider disability; worked building surveyor job at [employer], looking for something new again. - Awaiting housing at [employer].    Length of Stay: 0  Beckey LITTIE Coe, NP  07/09/2024, 9:52 AM  Advanced Heart Failure Team Pager [phone] (M-F; 7a - 5p)   Please visit [email or web address]: For overnight coverage please call cardiology fellow first. If fellow not available call Shock/ECMO MD on call.  For ECMO /  Mechanical Support (Impella, IABP, LVAD) issues call Shock / ECMO MD on call.       [1]  Allergies Allergen Reactions   Shrimp [Shellfish Allergy] Anaphylaxis and Itching    ALL SHELLFISH   Shellfish Allergy Itching   "

## 2024-07-09 NOTE — ED Notes (Signed)
 Called and placed PT on monitor with CCMD

## 2024-07-09 NOTE — H&P (Signed)
 " History and Physical    Jesse Key FMW:[id number] DOB: 1966-02-14 DOA: 07/08/2024  PCP: Jesse Rosaline SQUIBB, NP  Patient coming from: Home  Chief Complaint: Chest pain  HPI: Jesse Key is a 59 y.o. male with medical history significant of chronic HFrEF/nonischemic cardiomyopathy, nonobstructive CAD per cath 01/2024, LBBB, hyperlipidemia, COPD/asthma, CKD stage IIIa, hemorrhoids, alcohol and tobacco use, history of cocaine abuse, homelessness.  He was admitted last month 12/28-12/30 for bright red blood per rectum.  His hemoglobin was stable.  He underwent colonoscopy which was significant only for small colon polyp and external hemorrhoids.  Patient presents to the ED with complaints of chest pain and shortness of breath.  Patient states since yesterday morning he is having intermittent sharp left-sided chest pain associated with shortness of breath.  Chest pain can occur with exertion and even with rest.  Not having active chest pain at this time.  Reporting cough and runny nose.  Although he is currently homeless, he has been compliant with all of his home medications.  Patient states a lady brings all of his medications to him and fills out his pillbox regularly.  He does mention that it is difficult for him to watch his sodium intake due to homelessness/only being able to eat fast food.  He also states that he has to walk a lot on the street and drinks about 1.5 liters of water  a day.  He smokes about 2 or 3 cigarettes a day and denies regular or heavy alcohol use.  Denies any illicit drug use/no longer using cocaine.  ED Course: Tachycardic to the 110s on arrival but remainder of vital signs stable.  EKG showing sinus tachycardia, chronic LBBB, and no significant compared to previous tracings.  Labs notable for WBC count 14.2, hemoglobin 12.1 (baseline 13-14), MCV 75.2, bicarb 20, glucose 92, creatinine 1.37 (at baseline), AST 68 and remainder of LFTs normal, digoxin  level normal,  troponin 1513> 1613, proBNP 8698, UDS pending.  CTA chest negative for PE.  Showing cardiac enlargement with reflux of contrast material into the hepatic veins suggesting right heart failure.  Cardiology consulted and patient was started on heparin  drip for possible ACS.  Heart failure team will see the patient in the morning.  Patient was also given aspirin  324 mg, IV Lasix  40 mg, morphine , and nitroglycerin .  Review of Systems:  Review of Systems  All other systems reviewed and are negative.   Past Medical History:  Diagnosis Date   Asthma    Shellfish allergy     Past Surgical History:  Procedure Laterality Date   COLONOSCOPY N/A 07/02/2024   Procedure: COLONOSCOPY;  Surgeon: Jesse Victory LITTIE DOUGLAS, MD;  Location: [facility] ENDOSCOPY;  Service: Gastroenterology;  Laterality: N/A;   POLYPECTOMY  07/02/2024   Procedure: POLYPECTOMY, INTESTINE;  Surgeon: Jesse Victory LITTIE DOUGLAS, MD;  Location: [facility] ENDOSCOPY;  Service: Gastroenterology;;   RIGHT/LEFT HEART CATH AND CORONARY ANGIOGRAPHY N/A 11/24/2023   Procedure: RIGHT/LEFT HEART CATH AND CORONARY ANGIOGRAPHY;  Surgeon: Jesse Toribio SAUNDERS, MD;  Location: [facility] INVASIVE CV LAB;  Service: Cardiovascular;  Laterality: N/A;     reports that he has been smoking cigarettes. He has never used smokeless tobacco. He reports current alcohol use. He reports that he does not use drugs.  Allergies[1]  Family History  Problem Relation Age of Onset   Diabetes Mother    Hypertension Mother    Diabetes Father    Hypertension Father     Prior to Admission medications  Medication  Sig Start Date End Date Taking? Authorizing Provider  albuterol  (VENTOLIN  HFA) 108 (90 Base) MCG/ACT inhaler Inhale 1-2 puffs into the lungs every 6 (six) hours as needed for wheezing or shortness of breath. 06/28/24   Jesse, Elidia Sieving, MD  Digoxin  (LANOXIN ) 62.5 MCG TABS Take 1 tablet by mouth daily. 06/29/24   Jesse, Elidia Sieving, MD  empagliflozin  (JARDIANCE ) 10 MG TABS tablet  Take 1 tablet (10 mg total) by mouth daily before breakfast. 06/28/24   Jesse, Elidia Sieving, MD  furosemide  (LASIX ) 40 MG tablet Take 1 tablet (40 mg total) by mouth daily. 06/28/24   Jesse, Elidia Sieving, MD  metoprolol  succinate (TOPROL  XL) 25 MG 24 hr tablet Take 1/2 tablets (12.5 mg total) by mouth at bedtime. 07/08/24   Jesse Beckey CROME, NP  nicotine  (NICODERM CQ  - DOSED IN MG/24 HOURS) 14 mg/24hr patch Place 1 patch (14 mg total) onto the skin daily. Patient not taking: Reported on 07/08/2024 07/02/24   Jesse, Brayton RAMAN, MD  rosuvastatin  (CRESTOR ) 20 MG tablet Take 1 tablet (20 mg total) by mouth daily. 06/28/24   Jesse, Jesse Daniel, MD  sacubitril -valsartan  (ENTRESTO ) 24-26 MG Take 1 tablet by mouth 2 (two) times daily. 06/28/24   Jesse, Elidia Sieving, MD  spironolactone  (ALDACTONE ) 25 MG tablet Take 1/2 tablets (12.5 mg total) by mouth daily. 07/08/24 10/06/24  Jesse Beckey CROME, NP    Physical Exam: Vitals:   07/09/24 0242 07/09/24 0245 07/09/24 0415 07/09/24 0532  BP:  116/78 111/74   Pulse:  (!) 109 (!) 115   Resp:  20 (!) 23   Temp: 98 F (36.7 C)   98.2 F (36.8 C)  TempSrc: Oral   Oral  SpO2:  95% 100%   Weight:      Height:        Physical Exam Vitals reviewed.  Constitutional:      General: He is not in acute distress. HENT:     Head: Normocephalic and atraumatic.  Eyes:     Extraocular Movements: Extraocular movements intact.  Cardiovascular:     Rate and Rhythm: Regular rhythm. Tachycardia present.     Pulses: Normal pulses.  Pulmonary:     Effort: Pulmonary effort is normal. No respiratory distress.     Breath sounds: Normal breath sounds. No wheezing or rales.  Abdominal:     General: Bowel sounds are normal.     Palpations: Abdomen is soft.     Tenderness: There is no abdominal tenderness. There is no guarding.  Musculoskeletal:     Cervical back: Normal range of motion.     Right lower leg: No edema.     Left lower leg: No edema.  Skin:     General: Skin is warm and dry.  Neurological:     General: No focal deficit present.     Mental Status: He is alert and oriented to person, place, and time.     Labs on Admission: I have personally reviewed following labs and imaging studies  CBC: Recent Labs  Lab 07/08/24 2224  WBC 14.2*  HGB 12.1*  HCT 38.5*  MCV 75.2*  PLT 211   Basic Metabolic Panel: Recent Labs  Lab 07/08/24 2224  NA 138  K 4.5  CL 103  CO2 20*  GLUCOSE 92  BUN 20  CREATININE 1.37*  CALCIUM  9.1   GFR: Estimated Creatinine Clearance: 49.6 mL/min (A) (by C-G formula based on SCr of 1.37 mg/dL (H)). Liver Function Tests: Recent Labs  Lab 07/08/24  2224  AST 68*  ALT 27  ALKPHOS 79  BILITOT 0.4  PROT 7.3  ALBUMIN 3.9   No results for input(s): LIPASE, AMYLASE in the last 168 hours. No results for input(s): AMMONIA in the last 168 hours. Coagulation Profile: No results for input(s): INR, PROTIME in the last 168 hours. Cardiac Enzymes: No results for input(s): CKTOTAL, CKMB, CKMBINDEX, TROPONINI in the last 168 hours. BNP (last 3 results) Recent Labs    06/23/24 2241 06/30/24 1232 07/08/24 2224  PROBNP 11,603.0* 3,740.0* 8,698.0*   HbA1C: No results for input(s): HGBA1C in the last 72 hours. CBG: No results for input(s): GLUCAP in the last 168 hours. Lipid Profile: No results for input(s): CHOL, HDL, LDLCALC, TRIG, CHOLHDL, LDLDIRECT in the last 72 hours. Thyroid Function Tests: No results for input(s): TSH, T4TOTAL, FREET4, T3FREE, THYROIDAB in the last 72 hours. Anemia Panel: No results for input(s): VITAMINB12, FOLATE, FERRITIN, TIBC, IRON , RETICCTPCT in the last 72 hours. Urine analysis:    Component Value Date/Time   COLORURINE YELLOW 07/01/2024 0606   APPEARANCEUR CLEAR 07/01/2024 0606   LABSPEC 1.021 07/01/2024 0606   PHURINE 5.0 07/01/2024 0606   GLUCOSEU >=500 (A) 07/01/2024 0606   HGBUR SMALL (A) 07/01/2024  0606   BILIRUBINUR NEGATIVE 07/01/2024 0606   KETONESUR NEGATIVE 07/01/2024 0606   PROTEINUR 30 (A) 07/01/2024 0606   NITRITE NEGATIVE 07/01/2024 0606   LEUKOCYTESUR NEGATIVE 07/01/2024 0606    Radiological Exams on Admission: CT Angio Chest PE W and/or Wo Contrast Result Date: 07/09/2024 EXAM: CTA CHEST 07/09/2024 01:35:16 AM TECHNIQUE: CTA of the chest was performed after the administration of 75 mL of iohexol  (OMNIPAQUE ) 350 MG/ML injection. Multiplanar reformatted images are provided for review. MIP images are provided for review. Automated exposure control, iterative reconstruction, and/or weight based adjustment of the mA/kV was utilized to reduce the radiation dose to as low as reasonably achievable. COMPARISON: Chest radiograph 07/08/2024 and CT chest 06/30/2024. CLINICAL HISTORY: Pulmonary embolism (PE) suspected, high prob. FINDINGS: PULMONARY ARTERIES: Pulmonary arteries are adequately opacified for evaluation. No acute pulmonary embolus. Main pulmonary artery is normal in caliber. MEDIASTINUM: The heart demonstrates no acute abnormality. No pericardial effusions. Reflux of contrast material into the hepatic veins suggests right heart failure. There is no acute abnormality of the thoracic aorta. LYMPH NODES: No mediastinal, hilar or axillary lymphadenopathy. LUNGS AND PLEURA: Mild dependent atelectasis in the lung bases. No focal consolidation or pulmonary edema. No evidence of pleural effusion or pneumothorax. UPPER ABDOMEN: Limited images of the upper abdomen demonstrate a cyst in the upper pole of the left kidney, unchanged since the prior study. No imaging follow-up is indicated. SOFT TISSUES AND BONES: Mild motion artifact. Mild degenerative changes in the spine. No acute soft tissue abnormality. IMPRESSION: 1. No pulmonary embolism. 2. Cardiac enlargement with Reflux of contrast material into the hepatic veins suggests right heart failure. Electronically signed by: Elsie Gravely MD  07/09/2024 01:48 AM EST RP Workstation: [id number]   DG Chest 2 View Result Date: 07/08/2024 CLINICAL DATA:  Shortness of breath and chest pain EXAM: CHEST - 2 VIEW COMPARISON:  06/30/2024 FINDINGS: Mild cardiomegaly. No acute airspace disease or effusion. No pneumothorax. Zipper artifact over the upper chest IMPRESSION: No active cardiopulmonary disease. Mild cardiomegaly. Electronically Signed   By: Luke Bun M.D.   On: 07/08/2024 22:54    Assessment and Plan  Acute on chronic HFrEF/right heart failure Recent echo done 06/24/2024 showing EF <20%, global hypokinesis, RV systolic function normal, left atrium moderately dilated, and  mild mitral regurgitation.  RHC/LHC in 11/2023 showed diffuse nonobstructive CAD, normal filling pressures with mild pulmonary hypertension.  proBNP [id number].  CT showing reflux of contrast material into the hepatic veins suggesting right heart failure.  Cardiology consulted and patient was given IV Lasix  40 mg in the ED.  Advanced heart failure team will see the patient in the morning.  Continue digoxin , Jardiance , spironolactone , and Entresto .  Monitor intake and output, daily weights, renal function.  Given cocaine abuse (see below), will hold metoprolol  until he is evaluated by cardiology.  Chest pain and elevated troponin Cocaine abuse History of nonobstructive CAD Troponin 1513> 1613.  EKG without acute ischemic changes.  CTA chest negative for PE.  Although patient denies illicit drug use, his UDS came back positive for cocaine.  Elevated troponin likely due to demand ischemia versus ?ACS as cath done 11/2023 showed nonobstructive CAD.  No longer having active chest pain after receiving morphine  and nitroglycerin  in the ED.  He was given aspirin  in the ED.  Continue heparin  drip and keep n.p.o. except sips with meds until he is evaluated by cardiology in the morning.  Trend troponin.  Mild elevation of transaminase Likely related to decompensated CHF/hepatic  congestion.  LFTs were normal a week ago.  Monitor labs.  COPD/asthma Stable, no wheezing.  Xopenex  PRN.  CKD stage IIIa Creatinine at baseline, monitor labs.  History of hemorrhoids and lower GI bleed Not endorsing any ongoing GI bleed symptoms at this time and hemoglobin close to baseline.  Monitor H&H.  History of alcohol use He is tachycardic.  Placed on CIWA protocol for now.  Hyperlipidemia Continue Crestor .  DVT prophylaxis: IV heparin  gtt Code Status: Full Code (discussed with the patient) Consults called: Cardiology Level of care: Progressive Care Unit Admission status: It is my clinical opinion that referral for OBSERVATION is reasonable and necessary in this patient based on the above information provided. The aforementioned taken together are felt to place the patient at high risk for further clinical deterioration. However, it is anticipated that the patient may be medically stable for discharge from the hospital within 24 to 48 hours.  Editha Ram MD [facility]  If 7PM-7AM, please contact night-coverage [email or web address]  07/09/2024, 5:51 AM       [1]  Allergies Allergen Reactions   Shrimp [Shellfish Allergy] Anaphylaxis and Itching    ALL SHELLFISH   Shellfish Allergy Itching   "

## 2024-07-09 NOTE — ED Provider Notes (Signed)
 " [place] EMERGENCY DEPARTMENT AT [facility] Provider Note   CSN: [id number] Arrival date & time: 07/08/24  2211     Patient presents with: Chest Pain and Shortness of Breath   VARNELL ORVIS is a 59 y.o. male.   HPI     This is a 59 year old male with history of heart failure, tobacco use, alcohol use who presents with chest pain.  Reports 24-hour history of worsening anterior chest pain.  Describes it as sharp and nonradiating.  Reports progressive worsening.  Worse with ambulation.  Does report some shortness of breath and cough.  No fevers.  Denies lower extremity swelling.  No history of blood clots.  Reports compliance with his medications.  Patient is 10 months.  Reports that he walks a lot and is carrying a heavy bag which seems to make it worse.  Chart reviewed.  Multiple recent admissions and evaluation.  Patient challenging given his significant heart failure and social lack of resources and homelessness.  He does appear to follow-up fairly reasonably.  Last cardiac catheterization was in 11/2023.  Last EF less than 20%.  Most recent hospitalization with concern for GI bleed.  Colonoscopy indicated hemorrhoid.  He did have 1 polyp removed.  Prior to Admission medications  Medication Sig Start Date End Date Taking? Authorizing Provider  albuterol  (VENTOLIN  HFA) 108 (90 Base) MCG/ACT inhaler Inhale 1-2 puffs into the lungs every 6 (six) hours as needed for wheezing or shortness of breath. 06/28/24   Arrien, Elidia Sieving, MD  Digoxin  (LANOXIN ) 62.5 MCG TABS Take 1 tablet by mouth daily. 06/29/24   Arrien, Mauricio Daniel, MD  empagliflozin  (JARDIANCE ) 10 MG TABS tablet Take 1 tablet (10 mg total) by mouth daily before breakfast. 06/28/24   Arrien, Elidia Sieving, MD  furosemide  (LASIX ) 40 MG tablet Take 1 tablet (40 mg total) by mouth daily. 06/28/24   Arrien, Mauricio Daniel, MD  metoprolol  succinate (TOPROL  XL) 25 MG 24 hr tablet Take 1/2 tablets (12.5 mg total)  by mouth at bedtime. 07/08/24   Hayes Beckey CROME, NP  nicotine  (NICODERM CQ  - DOSED IN MG/24 HOURS) 14 mg/24hr patch Place 1 patch (14 mg total) onto the skin daily. Patient not taking: Reported on 07/08/2024 07/02/24   Elgergawy, Brayton RAMAN, MD  rosuvastatin  (CRESTOR ) 20 MG tablet Take 1 tablet (20 mg total) by mouth daily. 06/28/24   Arrien, Elidia Sieving, MD  sacubitril -valsartan  (ENTRESTO ) 24-26 MG Take 1 tablet by mouth 2 (two) times daily. 06/28/24   Arrien, Elidia Sieving, MD  spironolactone  (ALDACTONE ) 25 MG tablet Take 1/2 tablets (12.5 mg total) by mouth daily. 07/08/24 10/06/24  Hayes Beckey CROME, NP    Allergies: Shrimp [shellfish allergy] and Shellfish allergy    Review of Systems  Constitutional:  Negative for fever.  Respiratory:  Positive for cough and shortness of breath.   Cardiovascular:  Positive for chest pain. Negative for leg swelling.  All other systems reviewed and are negative.   Updated Vital Signs BP 117/78   Pulse (!) 109   Temp 97.7 F (36.5 C) (Oral)   Resp 19   Ht 1.702 m (5' 7)   Wt 61.2 kg   SpO2 100%   BMI 21.14 kg/m   Physical Exam Vitals and nursing note reviewed.  Constitutional:      Appearance: He is well-developed. He is not ill-appearing.  HENT:     Head: Normocephalic and atraumatic.  Eyes:     Pupils: Pupils are equal, round, and reactive  to light.  Cardiovascular:     Rate and Rhythm: Regular rhythm. Tachycardia present.     Heart sounds: Normal heart sounds. No murmur heard. Pulmonary:     Effort: Pulmonary effort is normal. No respiratory distress.     Breath sounds: Normal breath sounds. No wheezing.  Abdominal:     General: Bowel sounds are normal.     Palpations: Abdomen is soft.     Tenderness: There is no abdominal tenderness. There is no rebound.  Musculoskeletal:     Cervical back: Neck supple.  Lymphadenopathy:     Cervical: No cervical adenopathy.  Skin:    General: Skin is warm and dry.  Neurological:     Mental Status:  He is alert and oriented to person, place, and time.  Psychiatric:        Mood and Affect: Mood normal.     (all labs ordered are listed, but only abnormal results are displayed) Labs Reviewed  CBC - Abnormal; Notable for the following components:      Result Value   WBC 14.2 (*)    Hemoglobin 12.1 (*)    HCT 38.5 (*)    MCV 75.2 (*)    MCH 23.6 (*)    RDW 16.0 (*)    All other components within normal limits  PRO BRAIN NATRIURETIC PEPTIDE - Abnormal; Notable for the following components:   Pro Brain Natriuretic Peptide 8,698.0 (*)    All other components within normal limits  COMPREHENSIVE METABOLIC PANEL WITH GFR - Abnormal; Notable for the following components:   CO2 20 (*)    Creatinine, Ser 1.37 (*)    AST 68 (*)    GFR, Estimated 59 (*)    All other components within normal limits  TROPONIN T, HIGH SENSITIVITY - Abnormal; Notable for the following components:   Troponin T High Sensitivity 1,513 (*)    All other components within normal limits  TROPONIN T, HIGH SENSITIVITY - Abnormal; Notable for the following components:   Troponin T High Sensitivity 1,613 (*)    All other components within normal limits  DIGOXIN  LEVEL  URINE DRUG SCREEN    EKG: EKG Interpretation Date/Time:  Monday July 08 2024 22:46:09 EST Ventricular Rate:  119 PR Interval:  174 QRS Duration:  148 QT Interval:  312 QTC Calculation: 438 R Axis:   119  Text Interpretation: Sinus tachycardia Biatrial enlargement Right axis deviation Left ventricular hypertrophy with QRS widening and repolarization abnormality ( Cornell product )  Similar to prior EKGs Confirmed by Franklyn Gills [phone]) on 07/08/2024 10:57:52 PM  Radiology: CT Angio Chest PE W and/or Wo Contrast Result Date: 07/09/2024 EXAM: CTA CHEST 07/09/2024 01:35:16 AM TECHNIQUE: CTA of the chest was performed after the administration of 75 mL of iohexol  (OMNIPAQUE ) 350 MG/ML injection. Multiplanar reformatted images are provided for  review. MIP images are provided for review. Automated exposure control, iterative reconstruction, and/or weight based adjustment of the mA/kV was utilized to reduce the radiation dose to as low as reasonably achievable. COMPARISON: Chest radiograph 07/08/2024 and CT chest 06/30/2024. CLINICAL HISTORY: Pulmonary embolism (PE) suspected, high prob. FINDINGS: PULMONARY ARTERIES: Pulmonary arteries are adequately opacified for evaluation. No acute pulmonary embolus. Main pulmonary artery is normal in caliber. MEDIASTINUM: The heart demonstrates no acute abnormality. No pericardial effusions. Reflux of contrast material into the hepatic veins suggests right heart failure. There is no acute abnormality of the thoracic aorta. LYMPH NODES: No mediastinal, hilar or axillary lymphadenopathy. LUNGS AND PLEURA: Mild dependent atelectasis  in the lung bases. No focal consolidation or pulmonary edema. No evidence of pleural effusion or pneumothorax. UPPER ABDOMEN: Limited images of the upper abdomen demonstrate a cyst in the upper pole of the left kidney, unchanged since the prior study. No imaging follow-up is indicated. SOFT TISSUES AND BONES: Mild motion artifact. Mild degenerative changes in the spine. No acute soft tissue abnormality. IMPRESSION: 1. No pulmonary embolism. 2. Cardiac enlargement with Reflux of contrast material into the hepatic veins suggests right heart failure. Electronically signed by: Elsie Gravely MD 07/09/2024 01:48 AM EST RP Workstation: [id number]   DG Chest 2 View Result Date: 07/08/2024 CLINICAL DATA:  Shortness of breath and chest pain EXAM: CHEST - 2 VIEW COMPARISON:  06/30/2024 FINDINGS: Mild cardiomegaly. No acute airspace disease or effusion. No pneumothorax. Zipper artifact over the upper chest IMPRESSION: No active cardiopulmonary disease. Mild cardiomegaly. Electronically Signed   By: Luke Bun M.D.   On: 07/08/2024 22:54     .Critical Care  Performed by: Bari Charmaine FALCON,  MD Authorized by: Bari Charmaine FALCON, MD   Critical care provider statement:    Critical care time (minutes):  40   Critical care was necessary to treat or prevent imminent or life-threatening deterioration of the following conditions:  Cardiac failure   Critical care was time spent personally by me on the following activities:  Development of treatment plan with patient or surrogate, discussions with consultants, evaluation of patient's response to treatment, examination of patient, ordering and review of laboratory studies, ordering and review of radiographic studies, ordering and performing treatments and interventions, pulse oximetry, re-evaluation of patient's condition and review of old charts    Medications Ordered in the ED  furosemide  (LASIX ) injection 40 mg (has no administration in time range)  nitroGLYCERIN  (NITROGLYN) 2 % ointment 1 inch (1 inch Topical Given 07/09/24 0122)  aspirin  chewable tablet 324 mg (324 mg Oral Given 07/09/24 0121)  morphine  (PF) 4 MG/ML injection 4 mg (4 mg Intravenous Given 07/09/24 0121)  iohexol  (OMNIPAQUE ) 350 MG/ML injection 75 mL (75 mLs Intravenous Contrast Given 07/09/24 0135)    Clinical Course as of 07/09/24 0223  Tue Jul 09, 2024  0222 Spoke to Dr. Cesario cardiology.  He has reviewed his cardiology history.  Given elevated troponins, would recommend starting heparin .  Recent colonoscopy only indicated hemorrhoid.  Will have heart failure team formally evaluate the patient first thing in the morning. [CH]    Clinical Course User Index [CH] Neha Waight, Charmaine FALCON, MD                                 Medical Decision Making Amount and/or Complexity of Data Reviewed Labs: ordered. Radiology: ordered.  Risk OTC drugs. Prescription drug management. Decision regarding hospitalization.   This patient presents to the ED for concern of chest pain, this involves an extensive number of treatment options, and is a complaint that carries with it a high risk of  complications and morbidity.  I considered the following differential and admission for this acute, potentially life threatening condition.  The differential diagnosis includes ACS, PE, pneumothorax, pneumonia, heart failure  MDM:    This is a 59 year old male with significant history of heart failure who presents with chest pain.  Reports exertional chest pain although the character of the pain is sharp.  It has been increasingly worse over the last 24 hours.  Known history of nonocclusive diffuse disease.  Known history  of nonischemic cardiomyopathy with recent EF of less than 20%.  Patient given aspirin  and nitroglycerin  paste.  EKG is largely unchanged from prior.  He is slightly tachycardic.  Initial troponin 1513.  CT PE study was obtained.  No evidence of PE.  Does show evidence of right heart strain and failure.  Repeat troponin 1613.  Discussed with cardiology fellow.  Heparin  initiated.  Will have heart failure team evaluate patient in the morning.  Unsure whether there would be repeat catheterization at this point and may be related to acute strain.  Patient was given 1 dose of his Lasix .  Will plan for admission to the hospitalist.  (Labs, imaging, consults)  Labs: I Ordered, and personally interpreted labs.  The pertinent results include: CBC, BMP, BNP, troponin  Imaging Studies ordered: I ordered imaging studies including chest x-ray, CT PE study I independently visualized and interpreted imaging. I agree with the radiologist interpretation  Additional history obtained from chart review.  External records from outside source obtained and reviewed including prior evaluations, cardiology evaluation, recent colonoscopy  Cardiac Monitoring: The patient was maintained on a cardiac monitor.  If on the cardiac monitor, I personally viewed and interpreted the cardiac monitored which showed an underlying rhythm of: NS  Reevaluation: After the interventions noted above, I reevaluated the  patient and found that they have :stayed the same  Social Determinants of Health:  homeless  Disposition: Admit  Co morbidities that complicate the patient evaluation  Past Medical History:  Diagnosis Date   Asthma    Shellfish allergy      Medicines Meds ordered this encounter  Medications   nitroGLYCERIN  (NITROGLYN) 2 % ointment 1 inch   aspirin  chewable tablet 324 mg   morphine  (PF) 4 MG/ML injection 4 mg   iohexol  (OMNIPAQUE ) 350 MG/ML injection 75 mL   furosemide  (LASIX ) injection 40 mg    I have reviewed the patients home medicines and have made adjustments as needed  Problem List / ED Course: Problem List Items Addressed This Visit       Cardiovascular and Mediastinum   Heart failure (HCC)   Relevant Medications   furosemide  (LASIX ) injection 40 mg   Other Visit Diagnoses       Angina pectoris    -  Primary   Relevant Medications   nitroGLYCERIN  (NITROGLYN) 2 % ointment 1 inch (Completed)   aspirin  chewable tablet 324 mg (Completed)   furosemide  (LASIX ) injection 40 mg     NSTEMI (non-ST elevated myocardial infarction) (HCC)       Relevant Medications   nitroGLYCERIN  (NITROGLYN) 2 % ointment 1 inch (Completed)   aspirin  chewable tablet 324 mg (Completed)   furosemide  (LASIX ) injection 40 mg                Final diagnoses:  Angina pectoris  NSTEMI (non-ST elevated myocardial infarction) (HCC)  Acute on chronic systolic heart failure [facility])    ED Discharge Orders     None          Bari Charmaine FALCON, MD 07/09/24 [id number]  "

## 2024-07-09 NOTE — ED Notes (Signed)
 Called floor to make aware of PT arrival

## 2024-07-09 NOTE — Progress Notes (Signed)
 " Progress Note   Patient: Jesse Key FMW:[id number] DOB: 09-11-1965 DOA: 07/08/2024     0 DOS: the patient was seen and examined on 07/09/2024    Brief hospital course:  Jesse Key is a 59 y.o. 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  Patient had been seen by cardiology earlier in the day but subsequently developed chest pain, shortness of breath.  Assessment and Plan:  Acute on chronic HFrEF/right heart failure Recent echo done 06/24/2024 showing EF <20%, global hypokinesis, RV systolic function normal, left atrium moderately dilated, and mild mitral regurgitation.   RHC/LHC in 11/2023 showed diffuse nonobstructive CAD, normal filling pressures with mild pulmonary hypertension.   proBNP [id number].   CT showing reflux of contrast material into the hepatic veins suggesting right heart failure.   Cardiology consulted and patient was given IV Lasix  40 mg in the ED.   - Advanced heart failure team consulted. Recs as below: - Got 40 IV lasix  this morning. Will repeat this afternoon and transition back to PO tomorrow.  - Continue digoxin  0.0625 daily. 0.8 on admission.   - Continue Jardiance  10 daily - Will use coreg when BB restarted. Monitor closely with recurrent cocaine use.  - Continue spironolactone  12.5 daily, Cr bumped with 25mg .  - continue Entresto  24/26 bid - Compliance much improved with Paramedicine. He finds Dede, paramedic very helpful. - EF remains <35% will need ICD, with LBBB may be candidate for CRT-D. Has been referred to EP though ongoing cocaine use may be a problem.    Chest pain and elevated troponin Cocaine abuse History of nonobstructive CAD Troponin 1513> 1613.   EKG without acute  ischemic changes.   CTA chest negative for PE.   Although patient denies illicit drug use, his UDS came back positive for cocaine.   Elevated troponin likely due to demand ischemia as cath done 11/2023 showed nonobstructive CAD.   - Cardiology following.  - Continue heparin  gtt.   Mild elevation of transaminase Likely related to decompensated CHF/hepatic congestion.  LFTs were normal a week ago.   - Monitor labs.   COPD/asthma Stable, no wheezing.   -Xopenex  PRN.   CKD stage IIIa Creatinine at baseline - monitor labs.   History of hemorrhoids and lower GI bleed Not endorsing any ongoing GI bleed symptoms at this time and hemoglobin close to baseline.   -Monitor H&H.   History of alcohol use He is tachycardic.   -Placed on CIWA protocol for now.   Hyperlipidemia -Continue Crestor .       Subjective: Patient complains of sharp retrosternal chest pain and shortness of breath.   Physical Exam: BP 112/76   Pulse (!) 105   Temp 98.7 F (37.1 C) (Oral)   Resp 19   Ht 5' 7 (1.702 m)   Wt 61.2 kg   SpO2 100%   BMI 21.14 kg/m     General: Alert, oriented X3  Eyes: Pupils equal, reactive  Oral cavity: moist mucous membranes  Head: Atraumatic, normocephalic  Neck: supple  Chest: Diminished breath sounds.  CVS: S1,S2 RRR. No murmurs  Abd: No distention, soft, non-tender. No masses palpable  Extr: No edema   MSK: No joint deformities or swelling  Neurological: Grossly intact.    Data Reviewed:    Latest Ref  Rng & Units 07/09/2024   11:00 AM 07/08/2024   10:24 PM 07/02/2024    3:16 AM  CBC  WBC 4.0 - 10.5 K/uL 14.7  14.2  9.0   Hemoglobin 13.0 - 17.0 g/dL 88.1  87.8  86.5   Hematocrit 39.0 - 52.0 % 37.5  38.5  42.1   Platelets 150 - 400 K/uL 209  211  188       Latest Ref Rng & Units 07/08/2024   10:24 PM 07/02/2024    3:16 AM 07/01/2024    1:57 AM  BMP  Glucose 70 - 99 mg/dL 92  85  894   BUN 6 - 20 mg/dL 20  12  18    Creatinine 0.61 - 1.24 mg/dL 8.62   8.74  8.43   Sodium 135 - 145 mmol/L 138  137  140   Potassium 3.5 - 5.1 mmol/L 4.5  4.7  4.7   Chloride 98 - 111 mmol/L 103  104  105   CO2 22 - 32 mmol/L 20  25  27    Calcium  8.9 - 10.3 mg/dL 9.1  9.1  8.7      Family Communication: n/a  Disposition: Status is: Observation       Author: MDALA-GAUSI, GOLDEN PILLOW, MD 07/09/2024 3:00 PM  Morning admission.  No charge  For on call review [email or web address].    "

## 2024-07-09 NOTE — Progress Notes (Addendum)
 PHARMACY - ANTICOAGULATION CONSULT NOTE  Pharmacy Consult for heparin  Indication: chest pain/ACS  Allergies[1]  Patient Measurements: Height: 5' 7 (170.2 cm) Weight: 61.2 kg (135 lb) IBW/kg (Calculated) : 66.1 HEPARIN  DW (KG): 61.2  Vital Signs: Temp: 97.7 F (36.5 C) (01/05 2226) Temp Source: Oral (01/05 2226) BP: 117/78 (01/06 0115) Pulse Rate: 109 (01/06 0115)  Labs: Recent Labs    07/08/24 2224  HGB 12.1*  HCT 38.5*  PLT 211  CREATININE 1.37*    Estimated Creatinine Clearance: 49.6 mL/min (A) (by C-G formula based on SCr of 1.37 mg/dL (H)).   Medical History: Past Medical History:  Diagnosis Date   Asthma    Shellfish allergy    Assessment: 44 yoM presented with intermittent LS chest pain and SOB. Pharmacy consulted to dose heparin  for ACS.  -CBC shows Hgb 12s (bl~13-14), plts WNL -No PTA oral anticoagulation -Trop 1600  Goal of Therapy:  Heparin  level 0.3-0.7 units/ml Monitor platelets by anticoagulation protocol: Yes   Plan:  Give 3600 units bolus x 1 Start heparin  infusion at 700 units/hr Check anti-Xa level in 6 hours and daily while on heparin  Continue to monitor H&H and platelets F/u cards eval  Lynwood Poplar, PharmD, BCPS Clinical Pharmacist 07/09/2024 2:24 AM        [1]  Allergies Allergen Reactions   Shrimp [Shellfish Allergy] Anaphylaxis and Itching    ALL SHELLFISH   Shellfish Allergy Itching

## 2024-07-09 NOTE — Progress Notes (Signed)
 PHARMACY - ANTICOAGULATION CONSULT NOTE  Pharmacy Consult for heparin  Indication: chest pain/ACS  Allergies[1]  Patient Measurements: Height: 5' 7 (170.2 cm) Weight: 61.2 kg (135 lb) IBW/kg (Calculated) : 66.1 HEPARIN  DW (KG): 61.2  Vital Signs: Temp: 98.7 F (37.1 C) (01/06 1130) Temp Source: Oral (01/06 1130) BP: 113/73 (01/06 1100) Pulse Rate: 111 (01/06 1100)  Labs: Recent Labs    07/08/24 2224 07/09/24 1100 07/09/24 1134  HGB 12.1* 11.8*  --   HCT 38.5* 37.5*  --   PLT 211 209  --   HEPARINUNFRC  --   --  <0.10*  CREATININE 1.37*  --   --     Estimated Creatinine Clearance: 49.6 mL/min (A) (by C-G formula based on SCr of 1.37 mg/dL (H)).   Medical History: Past Medical History:  Diagnosis Date   Asthma    Shellfish allergy    Assessment: 19 yoM presented with intermittent LS chest pain and SOB. Pharmacy consulted to dose heparin  for ACS.  -No PTA oral anticoagulation -Trop 1600  Heparin  level undetectable (<0.10). Per RN no issues with the infusion or signs of bleeding. CBC stable. Will increase and recheck.   Goal of Therapy:  Heparin  level 0.3-0.7 units/ml Monitor platelets by anticoagulation protocol: Yes   Plan:  Give 2000 units bolus x 1 Increase heparin  infusion at 900 units/hr Check anti-Xa level in 6 hours and daily while on heparin  Continue to monitor H&H and platelets F/u cards eval  Elma Fail, PharmD PGY1 Clinical Pharmacist [facility]  07/09/2024 12:30 PM         [1]  Allergies Allergen Reactions   Shrimp [Shellfish Allergy] Anaphylaxis and Itching    ALL SHELLFISH   Shellfish Allergy Itching

## 2024-07-10 ENCOUNTER — Ambulatory Visit: Payer: Self-pay | Admitting: Gastroenterology

## 2024-07-10 DIAGNOSIS — F1010 Alcohol abuse, uncomplicated: Secondary | ICD-10-CM | POA: Diagnosis not present

## 2024-07-10 DIAGNOSIS — N1831 Chronic kidney disease, stage 3a: Secondary | ICD-10-CM

## 2024-07-10 DIAGNOSIS — I5023 Acute on chronic systolic (congestive) heart failure: Secondary | ICD-10-CM | POA: Diagnosis not present

## 2024-07-10 DIAGNOSIS — I10 Essential (primary) hypertension: Secondary | ICD-10-CM | POA: Diagnosis not present

## 2024-07-10 DIAGNOSIS — I2510 Atherosclerotic heart disease of native coronary artery without angina pectoris: Secondary | ICD-10-CM | POA: Diagnosis not present

## 2024-07-10 LAB — LIPID PANEL
Cholesterol: 137 mg/dL (ref 0–200)
HDL: 73 mg/dL
LDL Cholesterol: 48 mg/dL (ref 0–99)
Total CHOL/HDL Ratio: 1.9 ratio
Triglycerides: 82 mg/dL
VLDL: 16 mg/dL (ref 0–40)

## 2024-07-10 LAB — COMPREHENSIVE METABOLIC PANEL WITH GFR
ALT: 22 U/L (ref 0–44)
AST: 42 U/L — ABNORMAL HIGH (ref 15–41)
Albumin: 3.5 g/dL (ref 3.5–5.0)
Alkaline Phosphatase: 91 U/L (ref 38–126)
Anion gap: 9 (ref 5–15)
BUN: 29 mg/dL — ABNORMAL HIGH (ref 6–20)
CO2: 28 mmol/L (ref 22–32)
Calcium: 8.8 mg/dL — ABNORMAL LOW (ref 8.9–10.3)
Chloride: 100 mmol/L (ref 98–111)
Creatinine, Ser: 1.81 mg/dL — ABNORMAL HIGH (ref 0.61–1.24)
GFR, Estimated: 42 mL/min — ABNORMAL LOW
Glucose, Bld: 121 mg/dL — ABNORMAL HIGH (ref 70–99)
Potassium: 4.5 mmol/L (ref 3.5–5.1)
Sodium: 137 mmol/L (ref 135–145)
Total Bilirubin: 0.4 mg/dL (ref 0.0–1.2)
Total Protein: 6.7 g/dL (ref 6.5–8.1)

## 2024-07-10 LAB — HEPARIN LEVEL (UNFRACTIONATED)
Heparin Unfractionated: 0.24 [IU]/mL — ABNORMAL LOW (ref 0.30–0.70)
Heparin Unfractionated: 0.31 [IU]/mL (ref 0.30–0.70)

## 2024-07-10 LAB — CBC
HCT: 35.8 % — ABNORMAL LOW (ref 39.0–52.0)
Hemoglobin: 11.3 g/dL — ABNORMAL LOW (ref 13.0–17.0)
MCH: 23.5 pg — ABNORMAL LOW (ref 26.0–34.0)
MCHC: 31.6 g/dL (ref 30.0–36.0)
MCV: 74.6 fL — ABNORMAL LOW (ref 80.0–100.0)
Platelets: 195 K/uL (ref 150–400)
RBC: 4.8 MIL/uL (ref 4.22–5.81)
RDW: 15.4 % (ref 11.5–15.5)
WBC: 11 K/uL — ABNORMAL HIGH (ref 4.0–10.5)
nRBC: 0 % (ref 0.0–0.2)

## 2024-07-10 LAB — MRSA NEXT GEN BY PCR, NASAL: MRSA by PCR Next Gen: NOT DETECTED

## 2024-07-10 LAB — MAGNESIUM: Magnesium: 2.3 mg/dL (ref 1.7–2.4)

## 2024-07-10 MED ORDER — ASPIRIN 81 MG PO CHEW
81.0000 mg | CHEWABLE_TABLET | ORAL | Status: AC
  Administered 2024-07-11: 81 mg via ORAL
  Filled 2024-07-10: qty 1

## 2024-07-10 MED ORDER — VERAPAMIL HCL 2.5 MG/ML IV SOLN
INTRAVENOUS | Status: AC
  Filled 2024-07-10: qty 2

## 2024-07-10 MED ORDER — MIDAZOLAM HCL 2 MG/2ML IJ SOLN
INTRAMUSCULAR | Status: AC
  Filled 2024-07-10: qty 2

## 2024-07-10 MED ORDER — HEPARIN SODIUM (PORCINE) 1000 UNIT/ML IJ SOLN
INTRAMUSCULAR | Status: AC
  Filled 2024-07-10: qty 10

## 2024-07-10 MED ORDER — FREE WATER
250.0000 mL | Freq: Once | Status: AC
  Administered 2024-07-11: 250 mL via ORAL

## 2024-07-10 MED ORDER — LIDOCAINE HCL (PF) 1 % IJ SOLN
INTRAMUSCULAR | Status: AC
  Filled 2024-07-10: qty 30

## 2024-07-10 MED ORDER — SODIUM CHLORIDE 0.9 % IV SOLN: INTRAVENOUS | Status: AC

## 2024-07-10 MED ORDER — ASPIRIN 81 MG PO TBEC
81.0000 mg | DELAYED_RELEASE_TABLET | Freq: Every day | ORAL | Status: DC
  Administered 2024-07-12 – 2024-07-24 (×13): 81 mg via ORAL
  Filled 2024-07-10 (×13): qty 1

## 2024-07-10 MED ORDER — LORAZEPAM 1 MG PO TABS
1.0000 mg | ORAL_TABLET | ORAL | Status: DC | PRN
  Administered 2024-07-18 – 2024-07-23 (×4): 1 mg via ORAL
  Filled 2024-07-10 (×4): qty 1

## 2024-07-10 MED ORDER — FENTANYL CITRATE (PF) 100 MCG/2ML IJ SOLN
INTRAMUSCULAR | Status: AC
  Filled 2024-07-10: qty 2

## 2024-07-10 MED ORDER — LABETALOL HCL 5 MG/ML IV SOLN
INTRAVENOUS | Status: AC
  Filled 2024-07-10: qty 4

## 2024-07-10 NOTE — Assessment & Plan Note (Signed)
Continue blood pressure control with entresto and spironolactone.

## 2024-07-10 NOTE — Progress Notes (Signed)
 PHARMACY - ANTICOAGULATION CONSULT NOTE  Pharmacy Consult for heparin  Indication: chest pain/ACS  Allergies[1]  Patient Measurements: Height: 5' 6 (167.6 cm) Weight: 59.9 kg (132 lb) IBW/kg (Calculated) : 63.8 HEPARIN  DW (KG): 59.9  Vital Signs: Temp: 98.9 F (37.2 C) (01/06 2300) Temp Source: Oral (01/06 2300) BP: 101/74 (01/06 2300) Pulse Rate: 105 (01/06 2300)  Labs: Recent Labs    07/08/24 2224 07/09/24 1100 07/09/24 1134 07/09/24 1847 07/10/24 0215  HGB 12.1* 11.8*  --   --  11.3*  HCT 38.5* 37.5*  --   --  35.8*  PLT 211 209  --   --  195  HEPARINUNFRC  --   --  <0.10* <0.10* 0.24*  CREATININE 1.37*  --   --   --  1.81*    Estimated Creatinine Clearance: 36.8 mL/min (A) (by C-G formula based on SCr of 1.81 mg/dL (H)).   Medical History: Past Medical History:  Diagnosis Date   Asthma    Shellfish allergy    Assessment: 10 yoM presented with intermittent LS chest pain and SOB. Pharmacy consulted to dose heparin  for ACS. No PTA oral anticoagulation; Initial Trop 1600  AM: heparin  level subtherapeutic on 1000 units/hr (~5h from rate change). Per RN, no signs/symptoms of bleed or issues with gtt running continuously. CBC shows Hgb low, stable in 11s, and plts 211 >195  Goal of Therapy:  Heparin  level 0.3-0.7 units/ml Monitor platelets by anticoagulation protocol: Yes   Plan:  Increase heparin  infusion at 1250 units/hr Check anti-Xa level in 6h Continue to monitor H&H and platelets Plan for LHC in AM (1600)  Lynwood Poplar, PharmD, BCPS Clinical Pharmacist 07/10/2024 3:20 AM               [1]  Allergies Allergen Reactions   Shrimp [Shellfish Allergy] Anaphylaxis and Itching    ALL SHELLFISH   Shellfish Allergy Itching

## 2024-07-10 NOTE — Assessment & Plan Note (Signed)
 AKI  Renal function with serum cr at 1,81 with K at 4,5 and serum bicarbonate at 28  Na 137 and Mg 2,3   Plan to continue close follow up renal function and electrolytes  Holding on loop diuretic therapy Had hydration with isotonic saline  Follow up renal function in am

## 2024-07-10 NOTE — Plan of Care (Signed)

## 2024-07-10 NOTE — Progress Notes (Addendum)
 "    Advanced Heart Failure Rounding Note  Cardiologist: Soyla DELENA Merck, MD  AHF Cardiologist: Dr. Cherrie  Chief Complaint: Chest Pain  Patient Profile   [place]se [place] is a 59 y.o. male with h/o  tobacco, cocaine and alcohol use, chronic systolic heart failure and known CAD (diffuse moderate, nonobstructive dz by cath 5/25), presented with significantly elevated troponin and active chest pain concerning for NSTEMI. UDS + for cocaine.   Significant events:   Hs trop 1,513>>1,613>>2,107  Subjective:    Feels better but still w/ mild, sharp CP. On heparin  gtt.   SCr 1.37>>1.81 today after dose of IV Lasix  yesterday. 1.4L in UOP   Laying supine in bed. Denies dyspnea. No orthopnea/PND.    Objective:   Weight Range: 59.1 kg Body mass index is 21.01 kg/m.   Vital Signs:   Temp:  [98.3 F (36.8 C)-99.5 F (37.5 C)] 98.5 F (36.9 C) (01/07 0700) Pulse Rate:  [102-117] 103 (01/07 0700) Resp:  [15-20] 19 (01/07 0700) BP: (100-116)/(60-80) 102/71 (01/07 0700) SpO2:  [94 %-100 %] 100 % (01/07 0700) Weight:  [59.1 kg-59.9 kg] 59.1 kg (01/07 0327)    Weight change: Filed Weights   07/08/24 2222 07/09/24 1753 07/10/24 0327  Weight: 61.2 kg 59.9 kg 59.1 kg    Intake/Output:   Intake/Output Summary (Last 24 hours) at 07/10/2024 0921 Last data filed at 07/10/2024 0549 Gross per 24 hour  Intake 248.58 ml  Output 1400 ml  Net -1151.42 ml     Physical Exam   General:  fatigued appearing.   Cor: Regular rate & rhythm. No MRG, + JVD 8-9 cm w/ HJR Lungs: clear Extremities: no edema   Telemetry   Sinus tach low 100s, personally reviewed   Labs   CBC Recent Labs    07/09/24 1100 07/10/24 0215  WBC 14.7* 11.0*  HGB 11.8* 11.3*  HCT 37.5* 35.8*  MCV 75.0* 74.6*  PLT 209 195   Basic Metabolic Panel Recent Labs    98/94/73 2224 07/09/24 1847 07/10/24 0215  NA 138  --  137  K 4.5  --  4.5  CL 103  --  100  CO2 20*  --  28  GLUCOSE 92  --  121*  BUN 20   --  29*  CREATININE 1.37*  --  1.81*  CALCIUM  9.1  --  8.8*  MG  --  2.2 2.3   Liver Function Tests Recent Labs    07/08/24 2224 07/10/24 0215  AST 68* 42*  ALT 27 22  ALKPHOS 79 91  BILITOT 0.4 0.4  PROT 7.3 6.7  ALBUMIN 3.9 3.5   No results for input(s): LIPASE, AMYLASE in the last 72 hours. Cardiac Enzymes No results for input(s): CKTOTAL, CKMB, CKMBINDEX, TROPONINI in the last 72 hours.  BNP: BNP (last 3 results) Recent Labs    04/01/24 1327 04/12/24 1014 05/21/24 1004  BNP >4,500.0* 3,217.5* 3,013.7*    ProBNP (last 3 results) Recent Labs    06/23/24 2241 06/30/24 1232 07/08/24 2224  PROBNP 11,603.0* 3,740.0* 8,698.0*     D-Dimer No results for input(s): DDIMER in the last 72 hours. Hemoglobin A1C No results for input(s): HGBA1C in the last 72 hours. Fasting Lipid Panel No results for input(s): CHOL, HDL, LDLCALC, TRIG, CHOLHDL, LDLDIRECT in the last 72 hours. Medications:   Scheduled Medications:  digoxin   0.0625 mg Oral Daily   empagliflozin   10 mg Oral QAC breakfast   folic acid   1 mg Oral  Daily   multivitamin with minerals  1 tablet Oral Daily   rosuvastatin   20 mg Oral Daily   sacubitril -valsartan   1 tablet Oral BID   sodium chloride  flush  3 mL Intravenous Q12H   spironolactone   12.5 mg Oral Daily   thiamine   100 mg Oral Daily   Or   thiamine   100 mg Intravenous Daily    Infusions:  sodium chloride      sodium chloride  75 mL/hr at 07/10/24 0807   heparin  1,250 Units/hr (07/10/24 0325)    PRN Medications: sodium chloride , acetaminophen  **OR** acetaminophen , levalbuterol , LORazepam  **OR** LORazepam , sodium chloride  flush  Assessment/Plan   Elevated Troponin CAD - Nonobstructive CAD on 7/25 cath.   - Atypical CP,  7/10 reproducible CP on exam - Hs trop 1,513>>1,613>>2,107. Cocaine + on admit  - Plan LHC today to r/o plaque rupture/ Type I NSTEMI  - Continue hep gtt - Continue ASA, statin.    Acute on  chronic systolic CHF: Nonischemic cardiomyopathy. Echo in 5/25 showed EF < 20%, RV nl, global HK, mid-mod MR. RHC/LHC in 5/25 showed non-obstructive CAD, well-compensated filling pressures with CI 2.07.  Cardiac MRI in 5/25 showed LV EF 18%, RV EF 35%, nonspecific RV insertion site LGE.  Echo 9/25: EF 15-20%, RV mildly reduced, RVSP 55 mmHg, severe MR, severe TR, dilated IVC.  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 Echo 12/25: EF <20%, LV with GHK, RV normal, LA mod dilated, mild MR - NYHA II. Given IV Lasix  on admit but now getting pre-cath IVF hydration given bump in SCr. He appears mildly fluid up on exam w/ elevated JVD and +HJR, though no current orthopnea/PND. Plan judicious use of IVFs, run at 75 cc/hr x 4 hr - Hold am dose of Entresto  and Spiro prior to [facility] given elevated SCr and soft BP   - Continue digoxin  0.0625 daily. - Continue Jardiance  10 daily - Will use coreg when BB restarted. Monitor closely with recurrent cocaine use.  - Compliance much improved with Paramedicine. He finds Dede, paramedic very helpful. - EF remains <35% will need ICD, with LBBB may be candidate for CRT-D. Has been referred to EP though ongoing cocaine use may be a problem.    LBBB - QRS only 148 ms on ECG   AKI on CKD stage 3b: Baseline ~1.5-1.6 - 1.8 today  - pre-cath hydration per above - follow BMP closely    Cocaine use Tobacco use ETOH use - Recently drank ETOH again because he was stressed.  - Denies cocaine use, only touches it to make money - UDS + cocaine and opiates - Smokes ~3 cigarettes a day - Cessation of all advised    SDOH:  - poor health literacy - Enrolled in paramedicine, compliance much improved - May eventually need to consider disability; worked building surveyor job at [employer], looking for something new again. - Awaiting housing at [employer].      Length of Stay: 0  Caffie Shed, PA-C  07/10/2024, 9:21 AM  Advanced Heart Failure  Team Pager [phone] (M-F; 7a - 5p)   Please visit [email or web address]: For overnight coverage please call cardiology fellow first. If fellow not available call Shock/ECMO MD on call.  For ECMO / Mechanical Support (Impella, IABP, LVAD) issues call Shock / ECMO MD on call.    Patient seen with PA, I formulated the plan and agree with the above note.  He got IV Lasix  yesterday, creatinine up 1.37 => 1.8 this  morning.  He was admitted with chest pain, TnT up to 2107.  Cocaine+ on UDS though he reports handling cocaine and not ingesting it.   General: NAD Neck: JVP 8-9 cm, no thyromegaly or thyroid nodule.  Lungs: Clear to auscultation bilaterally with normal respiratory effort. CV: Nondisplaced PMI.  Heart regular S1/S2, +S3, no murmur.  No peripheral edema.   Abdomen: Soft, nontender, no hepatosplenomegaly, no distention.  Skin: Intact without lesions or rashes.  Neurologic: Alert and oriented x 3.  Psych: Normal affect. Extremities: No clubbing or cyanosis.  HEENT: Normal.   NSTEMI, patient presented with chest pain and has troponin up to 2107.  He is also cocaine+.  Very mild residual CP today.  - Plan for coronary angiography today due to concern for plaque rupture event. Creatinine up to 1.8 today.  Though he is at least mildly volume overloaded, will hold off on further diuresis until after contrast load with cath (try to minimize).  He had very gentle hydration pre-cath this morning. Discussed risks/benefits with patient and he agrees to procedure.  - Continue heparin  gtt, ASA, statin.   Acute on chronic systolic CHF, h/o NICM.  Last echo in 12/25 with EF < 20%.  He is mildly volume overloaded on exam today.  - No diuresis yet given need for contrast load, will likely start Lasix  in the evening.  - Hold Entresto  until after cath.  - Continue spironolactone  and Jardiance .  - Not candidate for advanced therapies with ongoing involvement with cocaine.          - Ideally would get CRT-D placed,  but needs to show that he can stay off cocaine.   Ezra Shuck 07/10/2024 10:43 AM                                                              "

## 2024-07-10 NOTE — Progress Notes (Signed)
 PHARMACY - ANTICOAGULATION CONSULT NOTE  Pharmacy Consult for heparin  Indication: chest pain/ACS  Allergies[1]  Patient Measurements: Height: 5' 6 (167.6 cm) Weight: 59.1 kg (130 lb 3.2 oz) IBW/kg (Calculated) : 63.8 HEPARIN  DW (KG): 59.9  Vital Signs: Temp: 98.2 F (36.8 C) (01/07 1044) Temp Source: Oral (01/07 1044) BP: 109/74 (01/07 1044) Pulse Rate: 101 (01/07 0952)  Labs: Recent Labs    07/08/24 2224 07/09/24 1100 07/09/24 1134 07/09/24 1847 07/10/24 0215 07/10/24 1024  HGB 12.1* 11.8*  --   --  11.3*  --   HCT 38.5* 37.5*  --   --  35.8*  --   PLT 211 209  --   --  195  --   HEPARINUNFRC  --   --    < > <0.10* 0.24* 0.31  CREATININE 1.37*  --   --   --  1.81*  --    < > = values in this interval not displayed.    Estimated Creatinine Clearance: 36.3 mL/min (A) (by C-G formula based on SCr of 1.81 mg/dL (H)).   Medical History: Past Medical History:  Diagnosis Date   Asthma    Shellfish allergy    Assessment: 57 yoM presented with intermittent LS chest pain and SOB. Pharmacy consulted to dose heparin  for ACS. No PTA oral anticoagulation; Initial Trop 1600  AM: heparin  level subtherapeutic on 1000 units/hr (~5h from rate change). Per RN, no signs/symptoms of bleed or issues with gtt running continuously. CBC shows Hgb low, stable in 11s, and plts 211 >195  Update - heparin  level 0.31 now therapeutic on 1250 units/hr.   Goal of Therapy:  Heparin  level 0.3-0.7 units/ml Monitor platelets by anticoagulation protocol: Yes   Plan:  Continue heparin  infusion 1250 units/hr F/u after LHC today  Maurilio Fila, PharmD Clinical Pharmacist 07/10/2024  10:56 AM              [1]  Allergies Allergen Reactions   Shrimp [Shellfish Allergy] Anaphylaxis and Itching    ALL SHELLFISH   Shellfish Allergy Itching

## 2024-07-10 NOTE — Plan of Care (Signed)
  Problem: Activity: Goal: Risk for activity intolerance will decrease Outcome: Progressing   Problem: Nutrition: Goal: Adequate nutrition will be maintained Outcome: Progressing   Problem: Coping: Goal: Level of anxiety will decrease Outcome: Progressing   Problem: Pain Managment: Goal: General experience of comfort will improve and/or be controlled Outcome: Progressing   Problem: Safety: Goal: Ability to remain free from injury will improve Outcome: Progressing

## 2024-07-10 NOTE — Progress Notes (Signed)
" °  Progress Note   Patient: Jesse Key FMW:[id number] DOB: 02-Jul-1966 DOA: 07/08/2024     0 DOS: the patient was seen and examined on 07/10/2024   Brief hospital course:  Jesse Key is a 58 y.o. 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  Patient had been seen by cardiology earlier in the day but subsequently developed chest pain, shortness of breath   Assessment and Plan: * Acute on chronic systolic CHF (congestive heart failure) (HCC) Echocardiogram with reduced LV systolic function < 20%, global hypokinesis, moderate dilatation of LV cavity, diastolic parameters are indeterminate, RV systolic function preserved, LA with moderate dilatation, mild MR.  Plan to hold on diuresis for today in preparation for cardiac catheterization  Continue empagliflozin , spironolactone , entresto  and digoxin    CAD (coronary artery disease) Possible acute coronary syndrome Plaque rupture.  Continue heparin  IV for anticoagulation  Aspirin  and statin  Follow up on cardiac catheterization   Essential hypertension Continue blood pressure control with entresto  and spironolactone    Chronic kidney disease, stage 3a (HCC) AKI  Renal function with serum cr at 1,81 with K at 4,5 and serum bicarbonate at 28  Na 137 and Mg 2,3   Plan to continue close follow up renal function and electrolytes  Holding on loop diuretic therapy Had hydration with isotonic saline  Follow up renal function in am   ETOH abuse No signs of acute withdrawal, hold on lorazepam  CIWA for now Add as needed po lorazepam   Positive cocaine use with no intoxication         Subjective: Patient with improvement in dyspnea but not back to baseline,  continue to have chest pain, no nausea or vomiting   Physical Exam: Vitals:   07/10/24 0327 07/10/24 0700 07/10/24 0952 07/10/24 1044  BP:  102/71  109/74  Pulse: (!) 105 (!) 103 (!) 101   Resp:  19  18  Temp:  98.5 F (36.9 C)  98.2 F (36.8 C)  TempSrc:  Oral  Oral  SpO2: 100% 100%    Weight: 59.1 kg     Height:       Neurology awake and alert ENT with mild pallor Cardiovascular with S1 and S2 present and regular with no gallops or rubs, positive systolic murmur at the apex Mild  JVD Respiratory with no wheezing or rhonchi, no rales Abdomen with no distention, soft and non tender No lower extremity edema   Data Reviewed:    Family Communication: no family at the bedside  Disposition: Status is: Observation The patient remains OBS appropriate and will d/c before 2 midnights.  Planned Discharge Destination: Home     Author: Elidia Toribio Furnace, MD 07/10/2024 4:13 PM  For on call review [email or web address].  "

## 2024-07-10 NOTE — Assessment & Plan Note (Signed)
 No signs of acute withdrawal, hold on lorazepam  CIWA for now Add as needed po lorazepam   Positive cocaine use with no intoxication

## 2024-07-10 NOTE — Discharge Instructions (Addendum)
 TRANSPORTATION: -[name] Department of Health: Call [facility] and [employer] at [phone] for details. attractionguides.es  -[employer] [place]: [employer] [place] is the [employer]'s shared-ride transportation service for eligible riders who have a disability that prevents them from riding the fixed route bus. Call [phone]. [employer] [place] riders must pay a fare of $1.50 per trip, or may purchase a 10-ride punch card for $14.00 ($1.40 per ride) or a 40-ride punch card for $48.00 ($1.20 per ride).  -The [employer] transportation service is provided for senior citizens (60+) who live independently within [place] city limits and are unable to drive or have limited access to transportation. Call [phone] to schedule an appointment.  -[place] Transportation: For Medicare or Medicaid recipients call [phone]?[facility], wheelchair fleeta, and ambulatory quotes available.   MEDICAID TRANSPORTATION: -If you have a Medicaid blue card or pink card and have no other means for transportation to doctor's offices, clinics, dentists, hospitals, and other health related trip needs.  -Transportation services are available to all [place] locations. Trips to [facility] and [place] are provided in association with [employer]. -Services are provided between 6:00AM and 9:00PM Monday-Friday. -Call [phone] to schedule a trip or request further information.   __________________________________________________________________________________________________________  [place] [facility] [place] [employer] Address: [address] [place], [place] [address] Phone Number: [phone] Hours of Operation: Residents of [place] can come to obtain food Monday through Friday from 8:30am until 3:30pm. Photo ID and Social Security cards required for all residents of a household.  Can come six times a year  [facility] Address: [address] [place], [place] [address] Phone Number: [phone] Hours of Operation: Operates Tuesday-Friday 10:00 a.m. to 1 p.m. Requirements: Referral from DSS needed. May come 6 times a year, 30 days apart. Photo ID and SS required for all residents of household.  [facility] Address: [address] [place], [place] [address] Phone Number: [phone] Hours of Operation: Food pantry is open on the last Saturday of each month from 10:00 am - 12:00 noon. No appointment needed. No qualifications.  [facility] Address: [address] [place], [place] [address] Phone Number: [phone]. [phone] Hours of Operation: Must make reservations to pick up food on Saturdays. Sign ups for Saturday pick up beginning at 8:30 a.m. on Monday morning.  [facility] Address: [address] [place], [place] [address] Phone Number: [phone] Hours of Operation: If you need food, bring proper identification such as a drivers license to receive a bag of food once a month. Requirements: Can come once every 30 days with referral DSS, Holiday Representative, Mental health etc. Each referral good for six visits. Photo ID required. *1st visit no referral required.  [facility] Address: [address], [place] [address] Phone Number: [phone]  [facility] Address: [address] [place], [place] [address] Phone Number: [phone] Hours of Operation:  You can register at [email or web address].[email or web address]/ for free groceries  [facility] Address: [address] [place], [place] [address] Phone Number: [phone] Hours of Operation: Drive through giveaway, first come first served. Every 3rd Saturday 11AM - 1PM  [facility] [address] Address: [address], [place] [address] Phone Number: [phone]   [place]  [employer]  [employer] [facility] Address: [address] [place], [place] [address] Phone Number: [phone] Hours of Operation: Once a month every 3rd Saturday  [facility] Address: [address] [place],   [address] Phone Number: [phone] Hours of Operation: Distribution happens from 9:00-10:00 a.m. every Saturday.     [facility] Address: [address] [place], [place] [address] Phone Number: [phone] Hours of Operation: ONCE a week for the community food distribution held every Tuesday, Wednesday and Thursday from 11 a.m. - 2:00 p.m. Food is available on a first come, first serve basis and varies week to week. No appointment necessary for drive thru pick up.  [facility] Address: [address] [place], [place] [address] Phone Number: [phone] Hours of Operation: Open every 3rd Thursday 9:30 a.m. - 11:00 a.m.  [facility] Address: [address] [place], [place] [address] Phone Number: [phone] Hours of Operation: Please call for hours, directions, and questions  [facility] [place] [facility] Address: [address] [place], [place]  [address] Phone Number: [phone] Website: https://www.hollyguns.co.za Food Finder app: https://findfood.ghpfa.org  [employer] [employer] Address: [address] [place], [place] [address] Phone Number: [phone] Hours of Operation: Contact Bree Harpe. Enrolled Substance Abuse Clients Only  [facility] Address: [address] [place], [address]  Phone Number: [phone] Hours of Operation: Contact Bartley Irving. Food pantry open the 3rd Saturday of each month from 9 a.m. -12 p.m. only  [place] [facility] Address: [address] [place], [place] [address] Phone Number: [phone] Hours of Operation: Contact Geni Lee. Emergency food bank open on Saturdays by appointment only  [facility] Address: [address] [place], [place] [address] Phone  Number: [phone] Hours of Operation: No specific contact person; Anyone can help  [facility] Address: [address] [place], [place] [address] Phone Number: [phone] Hours of Operation: Contact Medford Molt. Agency gives out a bag of food every Thursday from 2-4 p.m. only, and also provides a community meal every Thursday between 5-6 p.m. Other services provided include rent/mortgage and utility assistance, women's winter shelter, thrift store, and senior adult activities.  [facility] [place] Address: [address] [place], [place] [address] Phone Number: [phone] Hours of Operation: The Emergency Food Assistance Program provides individuals and families with a generous supply of food including meat, fresh vegetables, and nonperishable items. The food box contains five days worth of food, and each family or individual can receive a box once per month. M, W, Th, Fr 11am-2pm, walk-ins welcome.  [facility] Address: [address] [place], [place] [address]  Phone Number: [phone] Hours of Operation: Contact Asia Cathlean. Tuesdays and Thursdays from 11am - 3pm by appointment only  __________________________________________________________________________________________________________   Information about your medication: Plavix  (anti-platelet agent)  Generic Name (Brand): clopidogrel  (Plavix ), once daily medication  PURPOSE: You are taking this medication along with aspirin  to lower your chance of having a heart attack, stroke, or blood clots in your heart stent. These can be fatal. Plavix  and aspirin  help prevent platelets from sticking together and forming a clot that can block an artery or your stent.   Common SIDE EFFECTS you may experience include: bruising or bleeding more easily, shortness of breath  Do not stop taking PLAVIX  without talking to the doctor who prescribes it for you. People who are  treated with a stent and stop taking Plavix  too soon, have a higher risk of getting a blood clot in the stent, having a heart attack, or dying. If you stop Plavix  because of bleeding, or for other reasons, your risk  of a heart attack or stroke may increase.   Avoid taking NSAID agents or anti-inflammatory medications such as ibuprofen , naproxen given increased bleed risk with plavix  - can use acetaminophen  (Tylenol ) if needed for pain.  Avoid taking over the counter stomach medications omeprazole (Prilosec) or esomeprazole (Nexium) since these do interact and make plavix  less effective - ask your pharmacist or doctor for alterative agents if needed for heartburn or GERD.   Tell all of your doctors and dentists that you are taking Plavix . They should talk to the doctor who prescribed Plavix  for you before you have any surgery or invasive procedure.   Contact your health care provider if you experience: severe or uncontrollable bleeding, pink/red/brown urine, vomiting blood or vomit that looks like coffee grounds, red or black stools (looks like tar), coughing up blood or blood clots ----------------------------------------------------------------------------------------------------------------------

## 2024-07-10 NOTE — TOC Initial Note (Addendum)
 Transition of Care [facility]) - Initial/Assessment Note    Patient Details  Name: Jesse Key MRN: [id number] Date of Birth: 23-Jun-1966  Transition of Care [facility]) CM/SW Contact:    [name] Phone Number: [phone] 07/10/2024, 1:09 PM  Clinical Narrative:     HF CSW met with the patient at bedside. Patient stated that he is homeless and living outside. Patient stated that he is on the wait list for [facility] and a facility on [facility]. Patient stated that he used last visit resources but everyone has a wait list. Patient stated that his disability is pending and was completed at [facility]. Patient stated that he has no history of HH services and does not use nay equipment. Patient stated that he does not have a scale. Patient stated that he applied for FS but was denied due to criminal background. Patient denies any SA history. Patient stated that he is open to any and all additional shelter and food resources. CSW placed resources on AVS and left hard copies at bedside. Patient stated that he has a PCP. CSW explained that hospital follow up appointments are typically scheduled closer towards dc. Patient is agreeable. Patient stated that he uses the bus and medicaid for transportation. Patient stated that he will need assistance with transportation at dc.   CSW called the [facility] and spoke with Joen, receptionist for [employer] [phone]- Hotline #  Joen could not confirm whether the patient was on the list because the person who has the list information was not there. Joen provided the number that the patient may be contacted back on [phone]. CSW will updated the patient.   CSW called [facility] and spoke with director Prentice  who stated that they have a wait list with over 100 people. Patient will have to call and place himself on the list.   HF CSW/CM will continue to follow and monitor for dc readiness.                     Patient Goals  and CMS Choice            Expected Discharge Plan and Services                                              Prior Living Arrangements/Services                       Activities of Daily Living   ADL Screening (condition at time of admission) Independently performs ADLs?: Yes (appropriate for developmental age) Is the patient deaf or have difficulty hearing?: No Does the patient have difficulty seeing, even when wearing glasses/contacts?: No Does the patient have difficulty concentrating, remembering, or making decisions?: No  Permission Sought/Granted                  Emotional Assessment              Admission diagnosis:  Acute on chronic systolic heart failure (HCC) [I50.23] NSTEMI (non-ST elevated myocardial infarction) (HCC) [I21.4] Angina pectoris [I20.9] Chest pain [R07.9] Patient Active Problem List   Diagnosis Date Noted   Elevated liver transaminase level 07/09/2024   Bleeding internal hemorrhoids 07/02/2024   Rectal bleeding 06/30/2024   Asthma, chronic 06/26/2024   AKI (acute kidney injury) 06/25/2024   Lactic  acidosis 06/25/2024   CHF (congestive heart failure) (HCC) 06/24/2024   Nonischemic cardiomyopathy (HCC) 06/24/2024   Demand ischemia (HCC) 06/24/2024   Acute heart failure with reduced ejection fraction (HFrEF) (HCC) 06/24/2024   Acute gout 04/04/2024   Chest pain 04/02/2024   Chronic kidney disease, stage 3a (HCC) 04/02/2024   Hyperkalemia 04/02/2024   CAD (coronary artery disease) 04/02/2024   Acute on chronic HFrEF (heart failure with reduced ejection fraction) (HCC) 04/02/2024   Shortness of breath 11/25/2023   Essential hypertension 11/24/2023   Heart failure (HCC) 11/23/2023   ETOH abuse 11/23/2023   Elevated troponin 11/23/2023   Leukocytosis 11/23/2023   Acute pulmonary edema (HCC) 11/23/2023   PCP:  Celestia Rosaline SQUIBB, NP Pharmacy:   [facility] [address],  [address] [place] [address] Phone: [phone] Fax: [phone]  [facility] [address] [place] [address] Phone: [phone] Fax: [phone]  [facility] [address], [address] [place] [address] Phone: [phone] Fax: [phone]     Social Drivers of Health (SDOH) Social History: SDOH Screenings   Food Insecurity: Food Insecurity Present (07/09/2024)  Housing: High Risk (07/09/2024)  Transportation Needs: Unmet Transportation Needs (07/09/2024)  Utilities: At Risk (07/09/2024)  Depression (PHQ2-9): Medium Risk (06/04/2024)  Financial Resource Strain: High Risk (04/12/2024)  Stress: Stress Concern Present (06/04/2024)  Tobacco Use: High Risk (07/08/2024)  Health Literacy: Adequate Health Literacy (01/03/2024)   SDOH Interventions:     Readmission Risk Interventions    06/24/2024    3:25 PM  Readmission Risk Prevention Plan  Transportation Screening Complete  Home Care Screening Complete  Medication Review (RN CM) Complete

## 2024-07-10 NOTE — Assessment & Plan Note (Signed)
 Possible acute coronary syndrome Plaque rupture.  Continue heparin  IV for anticoagulation  Aspirin  and statin  Follow up on cardiac catheterization

## 2024-07-10 NOTE — Hospital Course (Signed)
"   Jesse Key is a 59 y.o. 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  Patient had been seen by cardiology earlier in the day but subsequently developed chest pain, shortness of breath  "

## 2024-07-10 NOTE — H&P (View-Only) (Signed)
 "    Advanced Heart Failure Rounding Note  Cardiologist: Soyla DELENA Merck, MD  AH[facility] Cardiologist: Dr. Cherrie  Chief Complaint: Chest Pain  Patient Profile   Jesse Key is a 59 y.o. male with h/o  tobacco, cocaine and alcohol use, chronic systolic heart failure and known CAD (diffuse moderate, nonobstructive dz by cath 5/25), presented with significantly elevated troponin and active chest pain concerning for NSTEMI. UDS + for cocaine.   Significant events:   Hs trop 1,513>>1,613>>2,107  Subjective:    Feels better but still w/ mild, sharp CP. On heparin  gtt.   SCr 1.37>>1.81 today after dose of IV Lasix  yesterday. 1.4L in UOP   Laying supine in bed. Denies dyspnea. No orthopnea/PND.    Objective:   Weight Range: 59.1 kg Body mass index is 21.01 kg/m.   Vital Signs:   Temp:  [98.3 F (36.8 C)-99.5 F (37.5 C)] 98.5 F (36.9 C) (01/07 0700) Pulse Rate:  [102-117] 103 (01/07 0700) Resp:  [15-20] 19 (01/07 0700) BP: (100-116)/(60-80) 102/71 (01/07 0700) SpO2:  [94 %-100 %] 100 % (01/07 0700) Weight:  [59.1 kg-59.9 kg] 59.1 kg (01/07 0327)    Weight change: Filed Weights   07/08/24 2222 07/09/24 1753 07/10/24 0327  Weight: 61.2 kg 59.9 kg 59.1 kg    Intake/Output:   Intake/Output Summary (Last 24 hours) at 07/10/2024 0921 Last data filed at 07/10/2024 0549 Gross per 24 hour  Intake 248.58 ml  Output 1400 ml  Net -1151.42 ml     Physical Exam   General:  fatigued appearing.   Cor: Regular rate & rhythm. No MRG, + JVD 8-9 cm w/ HJR Lungs: clear Extremities: no edema   Telemetry   Sinus tach low 100s, personally reviewed   Labs   CBC Recent Labs    07/09/24 1100 07/10/24 0215  WBC 14.7* 11.0*  HGB 11.8* 11.3*  HCT 37.5* 35.8*  MCV 75.0* 74.6*  PLT 209 195   Basic Metabolic Panel Recent Labs    98/94/73 2224 07/09/24 1847 07/10/24 0215  NA 138  --  137  K 4.5  --  4.5  CL 103  --  100  CO2 20*  --  28  GLUCOSE 92  --  121*  BUN 20   --  29*  CREATININE 1.37*  --  1.81*  CALCIUM  9.1  --  8.8*  MG  --  2.2 2.3   Liver Function Tests Recent Labs    07/08/24 2224 07/10/24 0215  AST 68* 42*  ALT 27 22  ALKPHOS 79 91  BILITOT 0.4 0.4  PROT 7.3 6.7  ALBUMIN 3.9 3.5   No results for input(s): LIPASE, AMYLASE in the last 72 hours. Cardiac Enzymes No results for input(s): CKTOTAL, CKMB, CKMBINDEX, TROPONINI in the last 72 hours.  BNP: BNP (last 3 results) Recent Labs    04/01/24 1327 04/12/24 1014 05/21/24 1004  BNP >4,500.0* 3,217.5* 3,013.7*    ProBNP (last 3 results) Recent Labs    06/23/24 2241 06/30/24 1232 07/08/24 2224  PROBNP 11,603.0* 3,740.0* 8,698.0*     D-Dimer No results for input(s): DDIMER in the last 72 hours. Hemoglobin A1C No results for input(s): HGBA1C in the last 72 hours. Fasting Lipid Panel No results for input(s): CHOL, HDL, LDLCALC, TRIG, CHOLHDL, LDLDIRECT in the last 72 hours. Medications:   Scheduled Medications:  digoxin   0.0625 mg Oral Daily   empagliflozin   10 mg Oral QAC breakfast   folic acid   1 mg Oral  Daily   multivitamin with minerals  1 tablet Oral Daily   rosuvastatin   20 mg Oral Daily   sacubitril -valsartan   1 tablet Oral BID   sodium chloride  flush  3 mL Intravenous Q12H   spironolactone   12.5 mg Oral Daily   thiamine   100 mg Oral Daily   Or   thiamine   100 mg Intravenous Daily    Infusions:  sodium chloride      sodium chloride  75 mL/hr at 07/10/24 0807   heparin  1,250 Units/hr (07/10/24 0325)    PRN Medications: sodium chloride , acetaminophen  **OR** acetaminophen , levalbuterol , LORazepam  **OR** LORazepam , sodium chloride  flush  Assessment/Plan   Elevated Troponin CAD - Nonobstructive CAD on 7/25 cath.   - Atypical CP,  7/10 reproducible CP on exam - Hs trop 1,513>>1,613>>2,107. Cocaine + on admit  - Plan LHC today to r/o plaque rupture/ Type I NSTEMI  - Continue hep gtt - Continue ASA, statin.    Acute on  chronic systolic CHF: Nonischemic cardiomyopathy. Echo in 5/25 showed EF < 20%, RV nl, global HK, mid-mod MR. RHC/LHC in 5/25 showed non-obstructive CAD, well-compensated filling pressures with CI 2.07.  Cardiac MRI in 5/25 showed LV EF 18%, RV EF 35%, nonspecific RV insertion site LGE.  Echo 9/25: EF 15-20%, RV mildly reduced, RVSP 55 mmHg, severe MR, severe TR, dilated IVC.  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 Echo 12/25: EF <20%, LV with GHK, RV normal, LA mod dilated, mild MR - NYHA II. Given IV Lasix  on admit but now getting pre-cath IVF hydration given bump in SCr. He appears mildly fluid up on exam w/ elevated JVD and +HJR, though no current orthopnea/PND. Plan judicious use of IVFs, run at 75 cc/hr x 4 hr - Hold am dose of Entresto  and Spiro prior to [facility] given elevated SCr and soft BP   - Continue digoxin  0.0625 daily. - Continue Jardiance  10 daily - Will use coreg when BB restarted. Monitor closely with recurrent cocaine use.  - Compliance much improved with Paramedicine. He finds Dede, paramedic very helpful. - EF remains <35% will need ICD, with LBBB may be candidate for CRT-D. Has been referred to EP though ongoing cocaine use may be a problem.    LBBB - QRS only 148 ms on ECG   AKI on CKD stage 3b: Baseline ~1.5-1.6 - 1.8 today  - pre-cath hydration per above - follow BMP closely    Cocaine use Tobacco use ETOH use - Recently drank ETOH again because he was stressed.  - Denies cocaine use, only touches it to make money - UDS + cocaine and opiates - Smokes ~3 cigarettes a day - Cessation of all advised    SDOH:  - poor health literacy - Enrolled in paramedicine, compliance much improved - May eventually need to consider disability; worked building surveyor job at [employer], looking for something new again. - Awaiting housing at [employer].      Length of Stay: 0  Caffie Shed, PA-C  07/10/2024, 9:21 AM  Advanced Heart Failure  Team Pager [phone] (M-F; 7a - 5p)   Please visit [email or web address]: For overnight coverage please call cardiology fellow first. If fellow not available call Shock/ECMO MD on call.  For ECMO / Mechanical Support (Impella, IABP, LVAD) issues call Shock / ECMO MD on call.    Patient seen with PA, I formulated the plan and agree with the above note.  He got IV Lasix  yesterday, creatinine up 1.37 => 1.8 this  morning.  He was admitted with chest pain, TnT up to 2107.  Cocaine+ on UDS though he reports handling cocaine and not ingesting it.   General: NAD Neck: JVP 8-9 cm, no thyromegaly or thyroid nodule.  Lungs: Clear to auscultation bilaterally with normal respiratory effort. CV: Nondisplaced PMI.  Heart regular S1/S2, +S3, no murmur.  No peripheral edema.   Abdomen: Soft, nontender, no hepatosplenomegaly, no distention.  Skin: Intact without lesions or rashes.  Neurologic: Alert and oriented x 3.  Psych: Normal affect. Extremities: No clubbing or cyanosis.  HEENT: Normal.   NSTEMI, patient presented with chest pain and has troponin up to 2107.  He is also cocaine+.  Very mild residual CP today.  - Plan for coronary angiography today due to concern for plaque rupture event. Creatinine up to 1.8 today.  Though he is at least mildly volume overloaded, will hold off on further diuresis until after contrast load with cath (try to minimize).  He had very gentle hydration pre-cath this morning. Discussed risks/benefits with patient and he agrees to procedure.  - Continue heparin  gtt, ASA, statin.   Acute on chronic systolic CHF, h/o NICM.  Last echo in 12/25 with EF < 20%.  He is mildly volume overloaded on exam today.  - No diuresis yet given need for contrast load, will likely start Lasix  in the evening.  - Hold Entresto  until after cath.  - Continue spironolactone  and Jardiance .  - Not candidate for advanced therapies with ongoing involvement with cocaine.          - Ideally would get CRT-D placed,  but needs to show that he can stay off cocaine.   Ezra Shuck 07/10/2024 10:43 AM                                                              "

## 2024-07-10 NOTE — Assessment & Plan Note (Addendum)
 Echocardiogram with reduced LV systolic function < 20%, global hypokinesis, moderate dilatation of LV cavity, diastolic parameters are indeterminate, RV systolic function preserved, LA with moderate dilatation, mild MR.  Plan to hold on diuresis for today in preparation for cardiac catheterization  Continue empagliflozin , spironolactone , entresto  and digoxin 

## 2024-07-10 NOTE — Plan of Care (Signed)
  Problem: Education: Goal: Knowledge of General Education information will improve Description: Including pain rating scale, medication(s)/side effects and non-pharmacologic comfort measures Outcome: Progressing   Problem: Health Behavior/Discharge Planning: Goal: Ability to manage health-related needs will improve Outcome: Progressing   Problem: Clinical Measurements: Goal: Ability to maintain clinical measurements within normal limits will improve Outcome: Progressing Goal: Will remain free from infection Outcome: Progressing Goal: Diagnostic test results will improve Outcome: Progressing Goal: Respiratory complications will improve Outcome: Progressing Goal: Cardiovascular complication will be avoided Outcome: Progressing   Problem: Nutrition: Goal: Adequate nutrition will be maintained Outcome: Progressing   Problem: Coping: Goal: Level of anxiety will decrease Outcome: Progressing   Problem: Elimination: Goal: Will not experience complications related to bowel motility Outcome: Progressing Goal: Will not experience complications related to urinary retention Outcome: Progressing   Problem: Pain Managment: Goal: General experience of comfort will improve and/or be controlled Outcome: Progressing   Problem: Safety: Goal: Ability to remain free from injury will improve Outcome: Progressing

## 2024-07-11 ENCOUNTER — Other Ambulatory Visit: Payer: Self-pay

## 2024-07-11 ENCOUNTER — Encounter (HOSPITAL_COMMUNITY): Admission: EM | Disposition: A | Payer: Self-pay | Source: Home / Self Care | Attending: Internal Medicine

## 2024-07-11 DIAGNOSIS — I2510 Atherosclerotic heart disease of native coronary artery without angina pectoris: Secondary | ICD-10-CM | POA: Diagnosis not present

## 2024-07-11 DIAGNOSIS — I5023 Acute on chronic systolic (congestive) heart failure: Secondary | ICD-10-CM | POA: Diagnosis not present

## 2024-07-11 HISTORY — PX: RIGHT/LEFT HEART CATH AND CORONARY ANGIOGRAPHY: CATH118266

## 2024-07-11 LAB — COMPREHENSIVE METABOLIC PANEL WITH GFR
ALT: 17 U/L (ref 0–44)
AST: 32 U/L (ref 15–41)
Albumin: 3.4 g/dL — ABNORMAL LOW (ref 3.5–5.0)
Alkaline Phosphatase: 82 U/L (ref 38–126)
Anion gap: 11 (ref 5–15)
BUN: 21 mg/dL — ABNORMAL HIGH (ref 6–20)
CO2: 23 mmol/L (ref 22–32)
Calcium: 8.9 mg/dL (ref 8.9–10.3)
Chloride: 102 mmol/L (ref 98–111)
Creatinine, Ser: 1.47 mg/dL — ABNORMAL HIGH (ref 0.61–1.24)
GFR, Estimated: 54 mL/min — ABNORMAL LOW
Glucose, Bld: 119 mg/dL — ABNORMAL HIGH (ref 70–99)
Potassium: 4.3 mmol/L (ref 3.5–5.1)
Sodium: 136 mmol/L (ref 135–145)
Total Bilirubin: 0.4 mg/dL (ref 0.0–1.2)
Total Protein: 6.7 g/dL (ref 6.5–8.1)

## 2024-07-11 LAB — CBC
HCT: 37.6 % — ABNORMAL LOW (ref 39.0–52.0)
Hemoglobin: 12.1 g/dL — ABNORMAL LOW (ref 13.0–17.0)
MCH: 24 pg — ABNORMAL LOW (ref 26.0–34.0)
MCHC: 32.2 g/dL (ref 30.0–36.0)
MCV: 74.6 fL — ABNORMAL LOW (ref 80.0–100.0)
Platelets: 195 K/uL (ref 150–400)
RBC: 5.04 MIL/uL (ref 4.22–5.81)
RDW: 15.6 % — ABNORMAL HIGH (ref 11.5–15.5)
WBC: 10.2 K/uL (ref 4.0–10.5)
nRBC: 0 % (ref 0.0–0.2)

## 2024-07-11 LAB — POCT I-STAT EG7
Acid-Base Excess: 0 mmol/L (ref 0.0–2.0)
Acid-Base Excess: 0 mmol/L (ref 0.0–2.0)
Bicarbonate: 25 mmol/L (ref 20.0–28.0)
Bicarbonate: 25 mmol/L (ref 20.0–28.0)
Calcium, Ion: 1.17 mmol/L (ref 1.15–1.40)
Calcium, Ion: 1.19 mmol/L (ref 1.15–1.40)
HCT: 40 % (ref 39.0–52.0)
HCT: 41 % (ref 39.0–52.0)
Hemoglobin: 13.6 g/dL (ref 13.0–17.0)
Hemoglobin: 13.9 g/dL (ref 13.0–17.0)
O2 Saturation: 42 %
O2 Saturation: 43 %
Potassium: 4.6 mmol/L (ref 3.5–5.1)
Potassium: 4.7 mmol/L (ref 3.5–5.1)
Sodium: 139 mmol/L (ref 135–145)
Sodium: 139 mmol/L (ref 135–145)
TCO2: 26 mmol/L (ref 22–32)
TCO2: 26 mmol/L (ref 22–32)
pCO2, Ven: 39.8 mmHg — ABNORMAL LOW (ref 44–60)
pCO2, Ven: 40 mmHg — ABNORMAL LOW (ref 44–60)
pH, Ven: 7.404 (ref 7.25–7.43)
pH, Ven: 7.405 (ref 7.25–7.43)
pO2, Ven: 24 mmHg — CL (ref 32–45)
pO2, Ven: 24 mmHg — CL (ref 32–45)

## 2024-07-11 LAB — BASIC METABOLIC PANEL WITH GFR
Anion gap: 12 (ref 5–15)
BUN: 23 mg/dL — ABNORMAL HIGH (ref 6–20)
CO2: 29 mmol/L (ref 22–32)
Calcium: 9.5 mg/dL (ref 8.9–10.3)
Chloride: 95 mmol/L — ABNORMAL LOW (ref 98–111)
Creatinine, Ser: 2.11 mg/dL — ABNORMAL HIGH (ref 0.61–1.24)
GFR, Estimated: 35 mL/min — ABNORMAL LOW
Glucose, Bld: 131 mg/dL — ABNORMAL HIGH (ref 70–99)
Potassium: 4.3 mmol/L (ref 3.5–5.1)
Sodium: 137 mmol/L (ref 135–145)

## 2024-07-11 LAB — HEPARIN LEVEL (UNFRACTIONATED): Heparin Unfractionated: 0.11 [IU]/mL — ABNORMAL LOW (ref 0.30–0.70)

## 2024-07-11 LAB — MAGNESIUM: Magnesium: 2.4 mg/dL (ref 1.7–2.4)

## 2024-07-11 MED ORDER — SODIUM CHLORIDE 0.9% FLUSH: 3.0000 mL | INTRAVENOUS | Status: DC | PRN

## 2024-07-11 MED ORDER — MIDAZOLAM HCL 2 MG/2ML IJ SOLN
INTRAMUSCULAR | Status: AC
  Filled 2024-07-11: qty 2

## 2024-07-11 MED ORDER — ENOXAPARIN SODIUM 40 MG/0.4ML IJ SOSY
40.0000 mg | PREFILLED_SYRINGE | INTRAMUSCULAR | Status: DC
  Administered 2024-07-11 – 2024-07-24 (×13): 40 mg via SUBCUTANEOUS
  Filled 2024-07-11 (×13): qty 0.4

## 2024-07-11 MED ORDER — MILRINONE LACTATE IN DEXTROSE 20-5 MG/100ML-% IV SOLN
0.2500 ug/kg/min | INTRAVENOUS | Status: DC
  Administered 2024-07-11 – 2024-07-15 (×5): 0.25 ug/kg/min via INTRAVENOUS
  Filled 2024-07-11 (×6): qty 100

## 2024-07-11 MED ORDER — ONDANSETRON HCL 4 MG/2ML IJ SOLN: 4.0000 mg | Freq: Four times a day (QID) | INTRAMUSCULAR | Status: DC | PRN

## 2024-07-11 MED ORDER — FREE WATER: 250.0000 mL | Freq: Once | Status: DC

## 2024-07-11 MED ORDER — HEPARIN SODIUM (PORCINE) 1000 UNIT/ML IJ SOLN
INTRAMUSCULAR | Status: DC | PRN
  Administered 2024-07-11: 3000 [IU] via INTRAVENOUS

## 2024-07-11 MED ORDER — SODIUM CHLORIDE 0.9% FLUSH
3.0000 mL | Freq: Two times a day (BID) | INTRAVENOUS | Status: DC
  Administered 2024-07-11: 3 mL via INTRAVENOUS

## 2024-07-11 MED ORDER — FUROSEMIDE 10 MG/ML IJ SOLN
80.0000 mg | Freq: Once | INTRAMUSCULAR | Status: AC
  Administered 2024-07-11: 80 mg via INTRAVENOUS
  Filled 2024-07-11: qty 8

## 2024-07-11 MED ORDER — CLOPIDOGREL BISULFATE 75 MG PO TABS
75.0000 mg | ORAL_TABLET | Freq: Every day | ORAL | Status: DC
  Administered 2024-07-12 – 2024-07-15 (×4): 75 mg via ORAL
  Filled 2024-07-11 (×4): qty 1

## 2024-07-11 MED ORDER — IOHEXOL 350 MG/ML SOLN
INTRAVENOUS | Status: DC | PRN
  Administered 2024-07-11: 60 mL

## 2024-07-11 MED ORDER — ACETAMINOPHEN 325 MG PO TABS: 650.0000 mg | ORAL_TABLET | ORAL | Status: DC | PRN

## 2024-07-11 MED ORDER — HEPARIN (PORCINE) IN NACL 1000-0.9 UT/500ML-% IV SOLN
INTRAVENOUS | Status: DC | PRN
  Administered 2024-07-11: 1000 mL

## 2024-07-11 MED ORDER — POTASSIUM CHLORIDE CRYS ER 20 MEQ PO TBCR
40.0000 meq | EXTENDED_RELEASE_TABLET | Freq: Once | ORAL | Status: AC
  Administered 2024-07-11: 40 meq via ORAL
  Filled 2024-07-11: qty 2

## 2024-07-11 MED ORDER — VERAPAMIL HCL 2.5 MG/ML IV SOLN
INTRAVENOUS | Status: DC | PRN
  Administered 2024-07-11: 10 mL via INTRA_ARTERIAL

## 2024-07-11 MED ORDER — FENTANYL CITRATE (PF) 100 MCG/2ML IJ SOLN
INTRAMUSCULAR | Status: DC | PRN
  Administered 2024-07-11: 25 ug via INTRAVENOUS

## 2024-07-11 MED ORDER — FENTANYL CITRATE (PF) 100 MCG/2ML IJ SOLN
INTRAMUSCULAR | Status: AC
  Filled 2024-07-11: qty 2

## 2024-07-11 MED ORDER — LABETALOL HCL 5 MG/ML IV SOLN: 10.0000 mg | INTRAVENOUS | Status: DC | PRN

## 2024-07-11 MED ORDER — LIDOCAINE HCL (PF) 1 % IJ SOLN
INTRAMUSCULAR | Status: DC | PRN
  Administered 2024-07-11 (×2): 2 mL

## 2024-07-11 MED ORDER — HEPARIN SODIUM (PORCINE) 1000 UNIT/ML IJ SOLN
INTRAMUSCULAR | Status: AC
  Filled 2024-07-11: qty 10

## 2024-07-11 MED ORDER — CHLORHEXIDINE GLUCONATE CLOTH 2 % EX PADS
6.0000 | MEDICATED_PAD | Freq: Every day | CUTANEOUS | Status: DC
  Administered 2024-07-12 – 2024-07-24 (×12): 6 via TOPICAL

## 2024-07-11 MED ORDER — SODIUM CHLORIDE 0.9% FLUSH: 10.0000 mL | INTRAVENOUS | Status: DC | PRN

## 2024-07-11 MED ORDER — ENOXAPARIN SODIUM 40 MG/0.4ML IJ SOSY: 40.0000 mg | PREFILLED_SYRINGE | INTRAMUSCULAR | Status: DC

## 2024-07-11 MED ORDER — HYDRALAZINE HCL 20 MG/ML IJ SOLN: 10.0000 mg | INTRAMUSCULAR | Status: AC | PRN

## 2024-07-11 MED ORDER — LIDOCAINE HCL (PF) 1 % IJ SOLN
INTRAMUSCULAR | Status: AC
  Filled 2024-07-11: qty 30

## 2024-07-11 MED ORDER — SODIUM CHLORIDE 0.9 % IV SOLN: 250.0000 mL | INTRAVENOUS | Status: DC | PRN

## 2024-07-11 MED ORDER — MIDAZOLAM HCL (PF) 2 MG/2ML IJ SOLN
INTRAMUSCULAR | Status: DC | PRN
  Administered 2024-07-11: 1 mg via INTRAVENOUS

## 2024-07-11 MED ORDER — VERAPAMIL HCL 2.5 MG/ML IV SOLN
INTRAVENOUS | Status: AC
  Filled 2024-07-11: qty 2

## 2024-07-11 MED ORDER — SODIUM CHLORIDE 0.9 % IV SOLN: 250.0000 mL | INTRAVENOUS | Status: AC | PRN

## 2024-07-11 MED ORDER — CLOPIDOGREL BISULFATE 300 MG PO TABS
300.0000 mg | ORAL_TABLET | Freq: Once | ORAL | Status: AC
  Administered 2024-07-11: 300 mg via ORAL
  Filled 2024-07-11: qty 1

## 2024-07-11 MED ORDER — FUROSEMIDE 10 MG/ML IJ SOLN
12.0000 mg/h | INTRAVENOUS | Status: DC
  Administered 2024-07-11 (×2): 12 mg/h via INTRAVENOUS
  Filled 2024-07-11 (×2): qty 20

## 2024-07-11 MED ORDER — SODIUM CHLORIDE 0.9% FLUSH
3.0000 mL | Freq: Two times a day (BID) | INTRAVENOUS | Status: DC
  Administered 2024-07-11 – 2024-07-24 (×20): 3 mL via INTRAVENOUS

## 2024-07-11 MED ORDER — SODIUM CHLORIDE 0.9% FLUSH
10.0000 mL | Freq: Two times a day (BID) | INTRAVENOUS | Status: DC
  Administered 2024-07-11: 10 mL
  Administered 2024-07-11: 30 mL
  Administered 2024-07-12 – 2024-07-16 (×10): 10 mL
  Administered 2024-07-17: 30 mL
  Administered 2024-07-17 – 2024-07-22 (×10): 10 mL
  Administered 2024-07-22: 30 mL
  Administered 2024-07-23 – 2024-07-24 (×3): 10 mL

## 2024-07-11 MED ORDER — HYDRALAZINE HCL 20 MG/ML IJ SOLN: 10.0000 mg | INTRAMUSCULAR | Status: DC | PRN

## 2024-07-11 MED ORDER — FREE WATER
250.0000 mL | Freq: Once | Status: AC
  Administered 2024-07-11: 250 mL via ORAL

## 2024-07-11 NOTE — Progress Notes (Signed)
 Peripherally Inserted Central Catheter Placement  The IV Nurse has discussed with the patient and/or persons authorized to consent for the patient, the purpose of this procedure and the potential benefits and risks involved with this procedure.  The benefits include less needle sticks, lab draws from the catheter, and the patient may be discharged home with the catheter. Risks include, but not limited to, infection, bleeding, blood clot (thrombus formation), and puncture of an artery; nerve damage and irregular heartbeat and possibility to perform a PICC exchange if needed/ordered by physician.  Alternatives to this procedure were also discussed.  Bard Power PICC patient education guide, fact sheet on infection prevention and patient information card has been provided to patient /or left at bedside.    PICC Placement Documentation  PICC Triple Lumen 07/11/24 Left Brachial 44 cm 0 cm (Active)  Indication for Insertion or Continuance of Line Vasoactive infusions 07/11/24 1321  Exposed Catheter (cm) 0 cm 07/11/24 1321  Site Assessment Clean, Dry, Intact 07/11/24 1321  Lumen #1 Status Saline locked;Blood return noted 07/11/24 1321  Lumen #2 Status Saline locked;Blood return noted 07/11/24 1321  Lumen #3 Status Saline locked;Blood return noted 07/11/24 1321  Dressing Type Transparent;Securing device 07/11/24 1321  Dressing Status Antimicrobial disc/dressing in place;Clean, Dry, Intact 07/11/24 1321  Line Care Connections checked and tightened 07/11/24 1321  Line Adjustment (NICU/IV Team Only) No 07/11/24 1321  Dressing Intervention New dressing;Adhesive placed at insertion site (IV team only) 07/11/24 1321  Dressing Change Due 07/18/24 07/11/24 1321       Jesse Key 07/11/2024, 1:22 PM

## 2024-07-11 NOTE — Progress Notes (Signed)
" ° °  Patient complaining of muscle cramping. EKG obtained which does not show significant changes when compared to prior tracings. Patient denying any chest pain. RN reports significant urine output today with diuresis. Will recheck BMET and Mag level. Instructed RN to reach out to on call if results concerning/need repletion or if patient develops chest pain. Discussed with on call MD, who agrees with this plan.  Waddell DELENA Donath, PA-C 07/11/2024 7:43 PM  "

## 2024-07-11 NOTE — Interval H&P Note (Signed)
 History and Physical Interval Note:  07/11/2024 8:53 AM  Jesse Key  has presented today for surgery, with the diagnosis of Chest pain.  The various methods of treatment have been discussed with the patient and family. After consideration of risks, benefits and other options for treatment, the patient has consented to  Procedures: RIGHT/LEFT HEART CATH AND CORONARY ANGIOGRAPHY (N/A) as a surgical intervention.  The patient's history has been reviewed, patient examined, no change in status, stable for surgery.  I have reviewed the patient's chart and labs.  Questions were answered to the patient's satisfaction.     [name] [employer]

## 2024-07-11 NOTE — Telephone Encounter (Signed)
 Letter mailed and recall placed.

## 2024-07-11 NOTE — Progress Notes (Addendum)
 Patient ID: Jesse Key, male   DOB: [birth year], 59 y.o.   MRN: [id number]     Advanced Heart Failure Rounding Note  Cardiologist: Soyla DELENA Merck, MD  [facility] Cardiologist: Dr. Cherrie  Chief Complaint: Chest Pain  Patient Profile   Jesse Key is a 59 y.o. male with h/o  tobacco, cocaine and alcohol use, chronic systolic heart failure and known CAD (diffuse moderate, nonobstructive dz by cath 5/25), presented with significantly elevated troponin and active chest pain concerning for NSTEMI. UDS + for cocaine.   Significant events:   Hs trop 1,513>>1,613>>2,107  Subjective:    Mild chest pain overnight but overall better.  Has been dyspneic with exertion, no orthopnea.   SCr 1.37>>1.81>>1.47  RHC/LHC today: Coronary Findings  Diagnostic Dominance: Right Left Main  Mid LM to Ost LAD lesion is 40% stenosed with 40% stenosed side branch in Ost Cx.    Left Anterior Descending  There is mild diffuse disease throughout the vessel.  Mid LAD-1 lesion is 30% stenosed.  Mid LAD-2 lesion is 40% stenosed.    Left Circumflex  There is mild diffuse disease throughout the vessel.    Right Coronary Artery  Prox RCA lesion is 30% stenosed.  Mid RCA lesion is 80% stenosed.    Intervention   No interventions have been documented.   Right Heart  Right Heart Pressures Procedural Findings: Hemodynamics (mmHg) RA mean 18 RV 64/25 PA 62/41, mean 45 PCWP mean 31 LV 103/36 AO 107/72  Oxygen saturations: PA 43% AO 97%  Cardiac Output (Fick) 2.49  Cardiac Index (Fick) 1.5 PVR 5 WU   Cardiac Output (Thermo) 2.7  Cardiac Index (Thermo) 1.62  PVR 5.2 WU  PAPi 1.2     Objective:   Weight Range: 59.1 kg Body mass index is 21.01 kg/m.   Vital Signs:   Temp:  [98 F (36.7 C)-99 F (37.2 C)] 99 F (37.2 C) (01/08 0731) Pulse Rate:  [0-111] 102 (01/08 0731) Resp:  [10-21] 17 (01/08 0731) BP: (105-185)/(71-123) 107/81 (01/08 0731) SpO2:  [85 %-100 %] 99 %  (01/08 0834) Last BM Date : 07/10/24  Weight change: Filed Weights   07/08/24 2222 07/09/24 1753 07/10/24 0327  Weight: 61.2 kg 59.9 kg 59.1 kg    Intake/Output:   Intake/Output Summary (Last 24 hours) at 07/11/2024 0937 Last data filed at 07/11/2024 0730 Gross per 24 hour  Intake 471.75 ml  Output 800 ml  Net -328.25 ml     Physical Exam   General: NAD Neck: JVP 16 cm, no thyromegaly or thyroid nodule.  Lungs: Clear to auscultation bilaterally with normal respiratory effort. CV: Lateral PMI.  Heart regular S1/S2, no S3/S4, no murmur.  1+ ankle edema.  Abdomen: Soft, nontender, no hepatosplenomegaly, no distention.  Skin: Intact without lesions or rashes.  Neurologic: Alert and oriented x 3.  Psych: Normal affect. Extremities: No clubbing or cyanosis.  HEENT: Normal.    Telemetry   NSR 90s-100s, personally reviewed   Labs   CBC Recent Labs    07/10/24 0215 07/11/24 0156  WBC 11.0* 10.2  HGB 11.3* 12.1*  HCT 35.8* 37.6*  MCV 74.6* 74.6*  PLT 195 195   Basic Metabolic Panel Recent Labs    98/93/73 1847 07/10/24 0215 07/11/24 0156  NA  --  137 136  K  --  4.5 4.3  CL  --  100 102  CO2  --  28 23  GLUCOSE  --  121* 119*  BUN  --  29* 21*  CREATININE  --  1.81* 1.47*  CALCIUM   --  8.8* 8.9  MG 2.2 2.3  --    Liver Function Tests Recent Labs    07/10/24 0215 07/11/24 0156  AST 42* 32  ALT 22 17  ALKPHOS 91 82  BILITOT 0.4 0.4  PROT 6.7 6.7  ALBUMIN 3.5 3.4*   No results for input(s): LIPASE, AMYLASE in the last 72 hours. Cardiac Enzymes No results for input(s): CKTOTAL, CKMB, CKMBINDEX, TROPONINI in the last 72 hours.  BNP: BNP (last 3 results) Recent Labs    04/01/24 1327 04/12/24 1014 05/21/24 1004  BNP >4,500.0* 3,217.5* 3,013.7*    ProBNP (last 3 results) Recent Labs    06/23/24 2241 06/30/24 1232 07/08/24 2224  PROBNP 11,603.0* 3,740.0* 8,698.0*     D-Dimer No results for input(s): DDIMER in the last 72  hours. Hemoglobin A1C No results for input(s): HGBA1C in the last 72 hours. Fasting Lipid Panel Recent Labs    07/10/24 1040  CHOL 137  HDL 73  LDLCALC 48  TRIG 82  CHOLHDL 1.9   Medications:   Scheduled Medications:  [MAR Hold] aspirin  EC  81 mg Oral Daily   clopidogrel   300 mg Oral Daily   [START ON 07/12/2024] clopidogrel   75 mg Oral Daily   [MAR Hold] digoxin   0.0625 mg Oral Daily   [MAR Hold] empagliflozin   10 mg Oral QAC breakfast   [MAR Hold] folic acid   1 mg Oral Daily   furosemide   80 mg Intravenous Once   [MAR Hold] multivitamin with minerals  1 tablet Oral Daily   potassium chloride   40 mEq Oral Once   [MAR Hold] rosuvastatin   20 mg Oral Daily   [MAR Hold] sacubitril -valsartan   1 tablet Oral BID   sodium chloride  flush  3 mL Intravenous Q12H   [MAR Hold] spironolactone   12.5 mg Oral Daily   [MAR Hold] thiamine   100 mg Oral Daily    Infusions:  furosemide  (LASIX ) 200 mg in dextrose  5 % 100 mL (2 mg/mL) infusion     milrinone       PRN Medications: [MAR Hold] acetaminophen  **OR** [MAR Hold] acetaminophen , fentaNYL , Heparin  (Porcine) in NaCl, heparin  sodium (porcine), iohexol , [MAR Hold] levalbuterol , lidocaine  (PF), [MAR Hold] LORazepam , midazolam  PF, Radial Cocktail/Verapamil  only, sodium chloride  flush  Assessment/Plan   Elevated Troponin CAD - Nonobstructive CAD on 7/25 cath.   - Admitted with CP.  - Hs trop 1,513>>1,613>>2,107. Cocaine + on admit  - Coronary angiography today showed 80% RCA stenosis, possible culprit for NSTEMI.  Coronary disease, however, does not explain his cardiomyopathy. He had low output and markedly elevated filling pressures on RHC (see below). Plan was to start milrinone  and aggressive diuresis. Will load Plavix  and return to cath lab when he is stabilized from a CHF standpoint for RCA intervention => likely Monday. Discussed with Dr. Darron.  - Can transition to DVT Lovenox .  - Continue ASA, statin.  - Load Plavix .    Acute on  chronic systolic CHF: Nonischemic cardiomyopathy. Echo in 5/25 showed EF < 20%, RV nl, global HK, mid-mod MR. RHC/LHC in 5/25 showed non-obstructive CAD, well-compensated filling pressures with CI 2.07.  Cardiac MRI in 5/25 showed LV EF 18%, RV EF 35%, nonspecific RV insertion site LGE.  Echo 9/25: EF 15-20%, RV mildly reduced, RVSP 55 mmHg, severe MR, severe TR, dilated IVC.  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 Echo 12/25: EF <20%, LV with GHK, RV normal,  LA mod dilated, mild MR.  Significant RCA disease on cath, but this does not explain his long-standing cardiomyopathy.  - NYHA II at admission, but hemodynamics on RHC much worse than would be expected with symptoms.  - RHC today showed markedly elevated right and left heart filling pressures with low CI (1.5 F/1.6 T) and low PAPi 1.2.  - I will place PICC to follow CVP and co-ox.  - Start milrinone  0.25 mcg/kg/min.   - Lasix  80 mg IV x 1 then gtt at 12 mg/hr.  Adjust as needed. Will give metolazone 2.5 x 1.  - Continue digoxin  0.0625 daily. - Continue Jardiance  10 daily - No beta blocker with low output.  - Continue spironolactone  12.5 daily.  - Poor candidate for advanced therapies with cocaine use (positive this admission), ETOH, and social situation (homeless). Compliance has been improved with paramedicine.  - EF remains <35% with LBBB-like IVCD.  Ideally would get CRT-D. Has been referred to EP though ongoing cocaine use will be a problem.    LBBB - QRS 148 ms on ECG   AKI on CKD stage 3b: Baseline ~1.5-1.6 - 1.8 at admission, down to 1.47 today.   - Suspect cardiorenal syndrome.  - follow BMP closely    Cocaine use Tobacco use ETOH use - Recently drank ETOH again because he was stressed.  - Denies cocaine use, says he only touches it to make money - UDS + cocaine and opiates - Smokes ~3 cigarettes a day - Cessation of all advised    SDOH:  - poor health literacy - Enrolled in paramedicine,  compliance much improved - May eventually need to consider disability; worked building surveyor job at [employer], looking for something new again. - Awaiting housing at [employer].    CRITICAL CARE Performed by: Ezra Shuck  Total critical care time: 40 minutes  Critical care time was exclusive of separately billable procedures and treating other patients.  Critical care was necessary to treat or prevent imminent or life-threatening deterioration.  Critical care was time spent personally by me on the following activities: development of treatment plan with patient and/or surrogate as well as nursing, discussions with consultants, evaluation of patient's response to treatment, examination of patient, obtaining history from patient or surrogate, ordering and performing treatments and interventions, ordering and review of laboratory studies, ordering and review of radiographic studies, pulse oximetry and re-evaluation of patient's condition.  Length of Stay: 0  Ezra Shuck, MD  07/11/2024, 9:37 AM  Advanced Heart Failure Team Pager [phone] (M-F; 7a - 5p)   Please visit [email or web address]: For overnight coverage please call cardiology fellow first. If fellow not available call Shock/ECMO MD on call.  For ECMO / Mechanical Support (Impella, IABP, LVAD) issues call Shock / ECMO MD on call.

## 2024-07-12 ENCOUNTER — Encounter (HOSPITAL_COMMUNITY): Payer: Self-pay | Admitting: Cardiology

## 2024-07-12 DIAGNOSIS — I5023 Acute on chronic systolic (congestive) heart failure: Secondary | ICD-10-CM | POA: Diagnosis not present

## 2024-07-12 LAB — CBC
HCT: 43.7 % (ref 39.0–52.0)
Hemoglobin: 13.7 g/dL (ref 13.0–17.0)
MCH: 23.9 pg — ABNORMAL LOW (ref 26.0–34.0)
MCHC: 31.4 g/dL (ref 30.0–36.0)
MCV: 76.3 fL — ABNORMAL LOW (ref 80.0–100.0)
Platelets: 180 K/uL (ref 150–400)
RBC: 5.73 MIL/uL (ref 4.22–5.81)
RDW: 16.3 % — ABNORMAL HIGH (ref 11.5–15.5)
WBC: 10 K/uL (ref 4.0–10.5)
nRBC: 0.3 % — ABNORMAL HIGH (ref 0.0–0.2)

## 2024-07-12 LAB — COMPREHENSIVE METABOLIC PANEL WITH GFR
ALT: 17 U/L (ref 0–44)
AST: 30 U/L (ref 15–41)
Albumin: 3.9 g/dL (ref 3.5–5.0)
Alkaline Phosphatase: 98 U/L (ref 38–126)
Anion gap: 16 — ABNORMAL HIGH (ref 5–15)
BUN: 24 mg/dL — ABNORMAL HIGH (ref 6–20)
CO2: 24 mmol/L (ref 22–32)
Calcium: 9.1 mg/dL (ref 8.9–10.3)
Chloride: 97 mmol/L — ABNORMAL LOW (ref 98–111)
Creatinine, Ser: 1.89 mg/dL — ABNORMAL HIGH (ref 0.61–1.24)
GFR, Estimated: 40 mL/min — ABNORMAL LOW
Glucose, Bld: 135 mg/dL — ABNORMAL HIGH (ref 70–99)
Potassium: 4.1 mmol/L (ref 3.5–5.1)
Sodium: 137 mmol/L (ref 135–145)
Total Bilirubin: 0.5 mg/dL (ref 0.0–1.2)
Total Protein: 8 g/dL (ref 6.5–8.1)

## 2024-07-12 LAB — COOXEMETRY PANEL
Carboxyhemoglobin: 1.4 % (ref 0.5–1.5)
Methemoglobin: <0.7 % (ref 0.0–1.5)
O2 Saturation: 55.5 %
Total hemoglobin: 14.1 g/dL (ref 12.0–16.0)

## 2024-07-12 LAB — HEPARIN LEVEL (UNFRACTIONATED): Heparin Unfractionated: 0.38 [IU]/mL (ref 0.30–0.70)

## 2024-07-12 MED ORDER — SPIRONOLACTONE 12.5 MG HALF TABLET
12.5000 mg | ORAL_TABLET | Freq: Every day | ORAL | Status: DC
  Administered 2024-07-12 – 2024-07-13 (×2): 12.5 mg via ORAL
  Filled 2024-07-12 (×2): qty 1

## 2024-07-12 NOTE — Progress Notes (Addendum)
 Patient ID: Jesse Key, male   DOB: [birth year], 59 y.o.   MRN: [id number]     Advanced Heart Failure Rounding Note  Cardiologist: Soyla DELENA Merck, MD  [facility] Cardiologist: Dr. Cherrie  Chief Complaint: Low-output HF Patient Profile   Jesse Key is a 59 y.o. male with h/o  tobacco, cocaine and alcohol use, chronic systolic heart failure and known CAD (diffuse moderate, nonobstructive dz by cath 5/25), presented with significantly elevated troponin and active chest pain concerning for NSTEMI. UDS + for cocaine.   Significant events:   1/8: R/LHC: see imaging below. RA 18, PA 62/41 (45), PCW 31, TD CO/CI 2.7/1.62, PAPi 1.2   Subjective:    Co-ox 56% on milrinone  0.25 mcg/kg/min. CVP 1 Diuresing well. Net negative 3.6L. Weight down 5 lbs.   sCr 1.47>2.11>1.89  Lying in bed. Feeling okay, no SOB. Some chest pain overnight.   Objective:    Weight Range: 57.1 kg Body mass index is 20.32 kg/m.   Vital Signs:   Temp:  [97.8 F (36.6 C)-99.2 F (37.3 C)] 97.8 F (36.6 C) (01/09 0810) Pulse Rate:  [0-128] 91 (01/09 0600) Resp:  [5-48] 23 (01/09 0600) BP: (88-124)/(57-109) 107/68 (01/09 0600) SpO2:  [83 %-100 %] 96 % (01/09 0600) Weight:  [57.1 kg] 57.1 kg (01/09 0500) Last BM Date : 07/10/24  Weight change: Filed Weights   07/09/24 1753 07/10/24 0327 07/12/24 0500  Weight: 59.9 kg 59.1 kg 57.1 kg   Intake/Output:  Intake/Output Summary (Last 24 hours) at 07/12/2024 0824 Last data filed at 07/12/2024 0600 Gross per 24 hour  Intake 1004.54 ml  Output 4385 ml  Net -3380.46 ml   Physical Exam   General: Well appearing. No distress  Cardiac: JVP flat. No murmurs  Extremities: Warm and dry.  No edema.  Neuro: A&O x3. Affect pleasant.   Telemetry   SR 80-90s, wide QRS, prolonged Qtc (personally reviewed)  Labs   CBC Recent Labs    07/11/24 0156 07/11/24 0903 07/11/24 0904 07/12/24 0342  WBC 10.2  --   --  10.0  HGB 12.1*   < > 13.9 13.7  HCT 37.6*    < > 41.0 43.7  MCV 74.6*  --   --  76.3*  PLT 195  --   --  180   < > = values in this interval not displayed.   Basic Metabolic Panel Recent Labs    98/92/73 0215 07/11/24 0156 07/11/24 1951 07/12/24 0342  NA 137   < > 137 137  K 4.5   < > 4.3 4.1  CL 100   < > 95* 97*  CO2 28   < > 29 24  GLUCOSE 121*   < > 131* 135*  BUN 29*   < > 23* 24*  CREATININE 1.81*   < > 2.11* 1.89*  CALCIUM  8.8*   < > 9.5 9.1  MG 2.3  --  2.4  --    < > = values in this interval not displayed.   Liver Function Tests Recent Labs    07/11/24 0156 07/12/24 0342  AST 32 30  ALT 17 17  ALKPHOS 82 98  BILITOT 0.4 0.5  PROT 6.7 8.0  ALBUMIN 3.4* 3.9   BNP (last 3 results) Recent Labs    04/01/24 1327 04/12/24 1014 05/21/24 1004  BNP >4,500.0* 3,217.5* 3,013.7*   ProBNP (last 3 results) Recent Labs    06/23/24 2241 06/30/24 1232 07/08/24 2224  PROBNP 11,603.0* 3,740.0* 8,698.0*  Fasting Lipid Panel Recent Labs    07/10/24 1040  CHOL 137  HDL 73  LDLCALC 48  TRIG 82  CHOLHDL 1.9   Cardiac Imaging:    LHC 07/12/23: Dominance: Right   Medications:    Scheduled Medications:  aspirin  EC  81 mg Oral Daily   Chlorhexidine  Gluconate Cloth  6 each Topical Daily   clopidogrel   75 mg Oral Daily   digoxin   0.0625 mg Oral Daily   empagliflozin   10 mg Oral QAC breakfast   enoxaparin  (LOVENOX ) injection  40 mg Subcutaneous Q24H   folic acid   1 mg Oral Daily   multivitamin with minerals  1 tablet Oral Daily   rosuvastatin   20 mg Oral Daily   sodium chloride  flush  10-40 mL Intracatheter Q12H   sodium chloride  flush  3 mL Intravenous Q12H   thiamine   100 mg Oral Daily    Infusions:  sodium chloride      furosemide  (LASIX ) 200 mg in dextrose  5 % 100 mL (2 mg/mL) infusion 12 mg/hr (07/12/24 0600)   milrinone  0.25 mcg/kg/min (07/12/24 0800)    PRN Medications: sodium chloride , acetaminophen  **OR** acetaminophen , levalbuterol , LORazepam , ondansetron  (ZOFRAN ) IV, sodium chloride   flush  Assessment/Plan   Elevated Troponin CAD - Nonobstructive CAD on 7/25 cath.   - Admitted with CP.  - Hs trop 1,513>>1,613>>2,107. Cocaine + on admit  - Coronary angiography showed 80% RCA stenosis, possible culprit for NSTEMI. Coronary disease, however, does not explain his cardiomyopathy. He has low output and markedly elevated filling pressures (see below).  - Plavix  loaded, continue 75 mg daily. Plan to return to cath lab for PCI to RCA, once optimized. Likely tomorrow. Discussed with Dr. Darron.  - continue AS, statin - on lovenox  for DVT ppx   Acute on chronic systolic CHF: Nonischemic cardiomyopathy. Echo in 5/25 showed EF < 20%, RV nl, global HK, mid-mod MR. RHC/LHC in 5/25 showed non-obstructive CAD, well-compensated filling pressures with CI 2.07.  Cardiac MRI in 5/25 showed LV EF 18%, RV EF 35%, nonspecific RV insertion site LGE.  Echo 9/25: EF 15-20%, RV mildly reduced, RVSP 55 mmHg, severe MR, severe TR, dilated IVC.  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 Echo 12/25: EF <20%, LV with GHK, RV normal, LA mod dilated, mild MR.  Significant RCA disease on cath, but this does not explain his long-standing cardiomyopathy.  - NYHA II at admission, but hemodynamics on RHC much worse than would be expected with symptoms.  - RHC 1/8: markedly elevated right and left heart filling pressures with low CI (1.5 F/1.6 T) and PAPi 1.2.  - Coox 56% on milrinone  0.25 mcg/kg/min.  - CVP low stop Lasix  gtt. Start PO torsemide  tomorrow. - Continue digoxin  0.0625 daily. - Continue Jardiance  10 daily - Restart spiro 12.5 mg daily, watch Cr. - No beta blocker with low output.  - Poor candidate for advanced therapies with cocaine use (positive this admission), ETOH, and social situation. Compliance had been improved with paramedicine, until lost job and stable housing.  - EF remains <35% with LBBB-like IVCD.  Ideally would get CRT-D. Has been referred to EP though ongoing  cocaine use will be a problem.    LBBB - QRS >150s on ECG and tele   AKI on CKD stage 3b: Baseline ~1.5-1.6 - 1.8 at admission. Down to 1.47, back up. - Suspect bump in Cr d/t dry, stop lasix .  - Suspect cardiorenal syndrome.  - follow BMP closely  Cocaine use Tobacco use ETOH use - Recently drank ETOH again because he was stressed.  - Denies cocaine use, says he only touches it to make money - UDS + cocaine and opiates - Smokes ~3 cigarettes a day - Cessation of all advised    SDOH:  - poor health literacy - Enrolled in paramedicine, compliance much improved - TOC consult to assist with disability application - Awaiting housing at [facility]   CRITICAL CARE Performed by: [name]  Total critical care time: 8 minutes  -Critical care time was exclusive of separately billable procedures and treating other patients. -Critical care was necessary to treat or prevent imminent or life-threatening deterioration. -Critical care was time spent personally by me on the following activities: development of treatment plan with patient and/or surrogate as well as nursing, discussions with consultants, evaluation of patient's response to treatment, examination of patient, obtaining history from patient or surrogate, ordering and performing treatments and interventions, ordering and review of laboratory studies, ordering and review of radiographic studies, pulse oximetry and re-evaluation of patient's condition.  Length of Stay: 1  [name], NP  07/12/2024, 8:24 AM  Advanced Heart Failure Team Pager [phone] (M-F; 7a - 5p)   Please visit [email or web address]: For overnight coverage please call cardiology fellow first. If fellow not available call Shock/ECMO MD on call.  For ECMO / Mechanical Support (Impella, IABP, LVAD) issues call Shock / ECMO MD on call.               Patient seen with NP, I formulated the plan and agree with the above note.   Weight down 5 lbs with diuresis, CVP now <  5.  He is on milrinone  0.25 with co-ox 56%.  Creatinine 2.1 => 1.89.  Feels better.   General: NAD Neck: No JVD, no thyromegaly or thyroid nodule.  Lungs: Clear to auscultation bilaterally with normal respiratory effort. CV: Nondisplaced PMI.  Heart regular S1/S2, no S3/S4, no murmur.  No peripheral edema.    Abdomen: Soft, nontender, no hepatosplenomegaly, no distention.  Skin: Intact without lesions or rashes.  Neurologic: Alert and oriented x 3.  Psych: Normal affect. Extremities: No clubbing or cyanosis.  HEENT: Normal.   Good diuresis yesterday with Lasix  gtt, weight down 5 lbs and CVP now < 5.   - Stop Lasix  gtt.  - Probably start po diuretic tomorrow.   Co-ox improved at 56% (in 40s yesterday).  Continue milrinone  0.25 for now.  - Continue digoxin  - Continue Jardiance  - Continue spironolactone  12.5 daily. - Hold Entresto  for now with soft BP and elevated creatinine, hopefully restart after PCI.  - As above, not currently candidate for advanced therapies (homeless, cocaine+).   Plan for PCI to RCA, will aim for Monday once creatinine has trended down to baseline.  2.1 => 1.89 today.  Continue Plavix .   May go to progressive floor.   Ezra Shuck 07/12/2024 9:30 AM

## 2024-07-12 NOTE — Plan of Care (Signed)
  Problem: Education: Goal: Knowledge of General Education information will improve Description: Including pain rating scale, medication(s)/side effects and non-pharmacologic comfort measures Outcome: Progressing   Problem: Health Behavior/Discharge Planning: Goal: Ability to manage health-related needs will improve Outcome: Progressing   Problem: Clinical Measurements: Goal: Ability to maintain clinical measurements within normal limits will improve Outcome: Progressing Goal: Will remain free from infection Outcome: Progressing Goal: Diagnostic test results will improve Outcome: Progressing Goal: Respiratory complications will improve Outcome: Progressing Goal: Cardiovascular complication will be avoided Outcome: Progressing   Problem: Activity: Goal: Risk for activity intolerance will decrease Outcome: Progressing   Problem: Nutrition: Goal: Adequate nutrition will be maintained Outcome: Progressing   Problem: Coping: Goal: Level of anxiety will decrease Outcome: Progressing   Problem: Elimination: Goal: Will not experience complications related to bowel motility Outcome: Progressing Goal: Will not experience complications related to urinary retention Outcome: Progressing   Problem: Pain Managment: Goal: General experience of comfort will improve and/or be controlled Outcome: Progressing   Problem: Safety: Goal: Ability to remain free from injury will improve Outcome: Progressing   Problem: Skin Integrity: Goal: Risk for impaired skin integrity will decrease Outcome: Progressing   Problem: Education: Goal: Understanding of CV disease, CV risk reduction, and recovery process will improve Outcome: Progressing Goal: Individualized Educational Video(s) Outcome: Progressing   Problem: Activity: Goal: Ability to return to baseline activity level will improve Outcome: Progressing   Problem: Cardiovascular: Goal: Ability to achieve and maintain adequate  cardiovascular perfusion will improve Outcome: Progressing Goal: Vascular access site(s) Level 0-1 will be maintained Outcome: Progressing   Problem: Health Behavior/Discharge Planning: Goal: Ability to safely manage health-related needs after discharge will improve Outcome: Progressing   Problem: Education: Goal: Understanding of CV disease, CV risk reduction, and recovery process will improve Outcome: Progressing Goal: Individualized Educational Video(s) Outcome: Progressing   Problem: Activity: Goal: Ability to return to baseline activity level will improve Outcome: Progressing   Problem: Cardiovascular: Goal: Ability to achieve and maintain adequate cardiovascular perfusion will improve Outcome: Progressing Goal: Vascular access site(s) Level 0-1 will be maintained Outcome: Progressing   Problem: Health Behavior/Discharge Planning: Goal: Ability to safely manage health-related needs after discharge will improve Outcome: Progressing

## 2024-07-12 NOTE — TOC Progression Note (Addendum)
 Transition of Care [facility]) - Progression Note    Patient Details  Name: Jesse Key MRN: [id number] Date of Birth: [birth year]  [facility] CM/SW Contact  [name] Phone Number: [phone] 07/12/2024, 3:08 PM  Clinical Narrative:   HF CSW inquired the [facility] team to about the patients disability status. Waiting for the [facility] team to follow back up.   Housing- still on several wait list.   HF CSW/CM will continue to follow and monitor for dc readiness.     Expected Discharge Plan: Homeless Shelter Barriers to Discharge: Homeless with medical needs               Expected Discharge Plan and Services       Living arrangements for the past 2 months: Homeless                                       Social Drivers of Health (SDOH) Interventions SDOH Screenings   Food Insecurity: Food Insecurity Present (07/09/2024)  Housing: High Risk (07/09/2024)  Transportation Needs: Unmet Transportation Needs (07/09/2024)  Utilities: At Risk (07/09/2024)  Depression (PHQ2-9): Medium Risk (06/04/2024)  Financial Resource Strain: High Risk (04/12/2024)  Stress: Stress Concern Present (06/04/2024)  Tobacco Use: High Risk (07/08/2024)  Health Literacy: Adequate Health Literacy (01/03/2024)    Readmission Risk Interventions    06/24/2024    3:25 PM  Readmission Risk Prevention Plan  Transportation Screening Complete  Home Care Screening Complete  Medication Review (RN CM) Complete

## 2024-07-13 DIAGNOSIS — I509 Heart failure, unspecified: Secondary | ICD-10-CM | POA: Diagnosis not present

## 2024-07-13 DIAGNOSIS — Z515 Encounter for palliative care: Secondary | ICD-10-CM | POA: Diagnosis not present

## 2024-07-13 DIAGNOSIS — I2510 Atherosclerotic heart disease of native coronary artery without angina pectoris: Secondary | ICD-10-CM | POA: Diagnosis not present

## 2024-07-13 DIAGNOSIS — I214 Non-ST elevation (NSTEMI) myocardial infarction: Principal | ICD-10-CM

## 2024-07-13 DIAGNOSIS — I130 Hypertensive heart and chronic kidney disease with heart failure and stage 1 through stage 4 chronic kidney disease, or unspecified chronic kidney disease: Secondary | ICD-10-CM

## 2024-07-13 DIAGNOSIS — I5023 Acute on chronic systolic (congestive) heart failure: Secondary | ICD-10-CM | POA: Diagnosis not present

## 2024-07-13 DIAGNOSIS — N1831 Chronic kidney disease, stage 3a: Secondary | ICD-10-CM | POA: Diagnosis not present

## 2024-07-13 LAB — CBC
HCT: 42.5 % (ref 39.0–52.0)
Hemoglobin: 13.8 g/dL (ref 13.0–17.0)
MCH: 24.1 pg — ABNORMAL LOW (ref 26.0–34.0)
MCHC: 32.5 g/dL (ref 30.0–36.0)
MCV: 74.2 fL — ABNORMAL LOW (ref 80.0–100.0)
Platelets: 179 K/uL (ref 150–400)
RBC: 5.73 MIL/uL (ref 4.22–5.81)
RDW: 16.5 % — ABNORMAL HIGH (ref 11.5–15.5)
WBC: 9.8 K/uL (ref 4.0–10.5)
nRBC: 0 % (ref 0.0–0.2)

## 2024-07-13 LAB — COOXEMETRY PANEL
Carboxyhemoglobin: 1.4 % (ref 0.5–1.5)
Methemoglobin: <0.7 % (ref 0.0–1.5)
O2 Saturation: 62.6 %
Total hemoglobin: 14.2 g/dL (ref 12.0–16.0)

## 2024-07-13 LAB — BASIC METABOLIC PANEL WITH GFR
Anion gap: 10 (ref 5–15)
BUN: 25 mg/dL — ABNORMAL HIGH (ref 6–20)
CO2: 26 mmol/L (ref 22–32)
Calcium: 9.2 mg/dL (ref 8.9–10.3)
Chloride: 95 mmol/L — ABNORMAL LOW (ref 98–111)
Creatinine, Ser: 1.62 mg/dL — ABNORMAL HIGH (ref 0.61–1.24)
GFR, Estimated: 48 mL/min — ABNORMAL LOW
Glucose, Bld: 173 mg/dL — ABNORMAL HIGH (ref 70–99)
Potassium: 3.9 mmol/L (ref 3.5–5.1)
Sodium: 132 mmol/L — ABNORMAL LOW (ref 135–145)

## 2024-07-13 LAB — LIPOPROTEIN A (LPA): Lipoprotein (a): 118.9 nmol/L — ABNORMAL HIGH

## 2024-07-13 MED ORDER — SPIRONOLACTONE 25 MG PO TABS
25.0000 mg | ORAL_TABLET | Freq: Every day | ORAL | Status: DC
  Administered 2024-07-14 – 2024-07-16 (×3): 25 mg via ORAL
  Filled 2024-07-13 (×3): qty 1

## 2024-07-13 NOTE — Plan of Care (Signed)

## 2024-07-13 NOTE — Consult Note (Signed)
 "                                                                                   Consultation Note Date: 07/13/2024   Patient Name: Jesse Key  DOB: October 19, 1965  MRN: [id number]  Age / Sex: 59 y.o., male  PCP: Celestia Rosaline SQUIBB, NP Referring Physician: Rolan Ezra RAMAN, MD  Reason for Consultation:  goals of care  HPI/Patient Profile: 59 y.o. male  with past medical history of heart failure, cocaine abuse,  admitted on 07/08/2024 with chest pain, NSTEMI. Had heart cath revealing 80% stenosis of one vessel- plan for stent placement possibly Monday. Palliative medicine consulted for GOC.    Primary Decision Maker PATIENT  Discussion: Per Dr. Orvilla note- patient with EF <20%, currently on milrinone , diuresing. Also has LBBB- like IVCD and EP consulted for CRT-D, although this is complicated by his continued cocaine use.  Labs- Cr improving - is 1.62 today.  Met with patient at bedside.  He is homeless. Has worked at [employer] and at [employer] during the holidays.  We reviewed his heart failure- educated him on cause of heart failure and resulting symptoms. Discussed it is a progressive illness that can get worse over time. He is SOB with exertion. Ok at rest.  He is currently houseless. He has access to his medications. He is able to sleep at the resource center when it is cold. He is aware that he needs housing with his chronic illness.  He does not have advanced directives or HCPOA.  He would want his brother, Denard Artiga to make medical decisions for him if her were unable. He has other siblings. We discussed that if he wants to specify Denard to be his surrogate he would benefit from completing an HCPOA document. I reviewed what an HCPOA document is. He agrees to completing.  He has not thought about his feelings regarding artificial life support.  Code status was discussed- he has not thought about if he would want to be resuscitated in the event of cardiac arrest. I  encouraged him to think about these things and let his brother know his wishes as well.    SUMMARY OF RECOMMENDATIONS -Advanced heart failure- EF <20%,  not candidate for advanced therapies, many social barriers -Patient prefers to continue full scope, full code, recommended he contemplate his preferences for resusciation and artificial life support- PMT will followup later in the week to see if he has more thoughts or questions about these topics -Patient's primary stressor is housing- unfortunately Palliative is unable to offer resources for housing, appreciate Social Work's assistance -Spiritual care consult requested to complete HCPOA  Code Status/Advance Care Planning:   Code Status: Full Code    Prognosis:   Unable to determine  Discharge Planning: To Be Determined  Primary Diagnoses: Present on Admission:  CAD (coronary artery disease)  Chronic kidney disease, stage 3a (HCC)  Essential hypertension  ETOH abuse   Review of Systems  Constitutional:  Positive for activity change and fatigue.  Respiratory:  Positive for shortness of breath.     Physical Exam Vitals and nursing note reviewed.  Constitutional:  General: He is not in acute distress. Cardiovascular:     Rate and Rhythm: Normal rate.  Pulmonary:     Effort: Pulmonary effort is normal.  Neurological:     Mental Status: He is alert and oriented to person, place, and time.     Vital Signs: BP 114/74 (BP Location: Right Arm)   Pulse 92   Temp 97.6 F (36.4 C) (Oral)   Resp 17   Ht 5' 6 (1.676 m)   Wt 58 kg   SpO2 97%   BMI 20.64 kg/m  Pain Scale: 0-10 POSS *See Group Information*: 1-Acceptable,Awake and alert Pain Score: 0-No pain   SpO2: SpO2: 97 % O2 Device:SpO2: 97 % O2 Flow Rate: .O2 Flow Rate (L/min): 2 L/min  IO: Intake/output summary:  Intake/Output Summary (Last 24 hours) at 07/13/2024 1226 Last data filed at 07/13/2024 1218 Gross per 24 hour  Intake 1140 ml  Output 1300 ml   Net -160 ml    LBM: Last BM Date : 07/11/24 Baseline Weight: Weight: 61.2 kg Most recent weight: Weight: 58 kg       Thank you for this consult. Palliative medicine will continue to follow and assist as needed.  Time Total: 85 mins Signed by: Cassondra Stain, AGNP-C Palliative Medicine  Time includes:   I personally spent a total of 85 minutes in the care of the patient today including preparing to see the patient, getting/reviewing separately obtained history, performing a medically appropriate exam/evaluation, counseling and educating, placing orders, documenting clinical information in the EHR, and coordinating care.   Please contact Palliative Medicine Team phone at [phone] for questions and concerns.  For individual provider: See Amion               "

## 2024-07-13 NOTE — Progress Notes (Signed)
 Patient ID: Jesse Key, male   DOB: 08-May-1966, 59 y.o.   MRN: [id number]     Advanced Heart Failure Rounding Note  Cardiologist: Soyla DELENA Merck, MD  [facility] Cardiologist: Dr. Cherrie  Chief Complaint: Low-output HF Patient Profile   Jesse Key is a 59 y.o. male with h/o  tobacco, cocaine and alcohol use, chronic systolic heart failure and known CAD (diffuse moderate, nonobstructive dz by cath 5/25), presented with significantly elevated troponin and active chest pain concerning for NSTEMI. UDS + for cocaine.   Significant events:   1/8: R/LHC: see imaging below. RA 18, PA 62/41 (45), PCW 31, TD CO/CI 2.7/1.62, PAPi 1.2   Subjective:    Co-ox 63% on milrinone  0.25 mcg/kg/min. CVP < 5.   sCr 1.47>2.11>1.89>1.62  No chest pain or dyspnea.    Objective:    Weight Range: 58 kg Body mass index is 20.64 kg/m.   Vital Signs:   Temp:  [97.7 F (36.5 C)-98.6 F (37 C)] 98.6 F (37 C) (01/10 0717) Pulse Rate:  [91-104] 91 (01/10 0717) Resp:  [14-27] 17 (01/10 0717) BP: (94-119)/(59-84) 110/73 (01/10 0717) SpO2:  [89 %-100 %] 96 % (01/10 0717) Weight:  [58 kg] 58 kg (01/10 0504) Last BM Date : 07/11/24  Weight change: Filed Weights   07/10/24 0327 07/12/24 0500 07/13/24 0504  Weight: 59.1 kg 57.1 kg 58 kg   Intake/Output:  Intake/Output Summary (Last 24 hours) at 07/13/2024 0946 Last data filed at 07/13/2024 0719 Gross per 24 hour  Intake 795.67 ml  Output 1150 ml  Net -354.33 ml   Physical Exam   General: Well appearing. No distress  Cardiac: JVP flat. No murmurs  Extremities: Warm and dry.  No edema.  Neuro: A&O x3. Affect pleasant.   Telemetry   SR 80-90s, wide QRS, prolonged Qtc (personally reviewed)  Labs   CBC Recent Labs    07/12/24 0342 07/13/24 0445  WBC 10.0 9.8  HGB 13.7 13.8  HCT 43.7 42.5  MCV 76.3* 74.2*  PLT 180 179   Basic Metabolic Panel Recent Labs    98/91/73 1951 07/12/24 0342 07/13/24 0445  NA 137 137 132*  K 4.3  4.1 3.9  CL 95* 97* 95*  CO2 29 24 26   GLUCOSE 131* 135* 173*  BUN 23* 24* 25*  CREATININE 2.11* 1.89* 1.62*  CALCIUM  9.5 9.1 9.2  MG 2.4  --   --    Liver Function Tests Recent Labs    07/11/24 0156 07/12/24 0342  AST 32 30  ALT 17 17  ALKPHOS 82 98  BILITOT 0.4 0.5  PROT 6.7 8.0  ALBUMIN 3.4* 3.9   BNP (last 3 results) Recent Labs    04/01/24 1327 04/12/24 1014 05/21/24 1004  BNP >4,500.0* 3,217.5* 3,013.7*   ProBNP (last 3 results) Recent Labs    06/23/24 2241 06/30/24 1232 07/08/24 2224  PROBNP 11,603.0* 3,740.0* 8,698.0*   Fasting Lipid Panel Recent Labs    07/10/24 1040  CHOL 137  HDL 73  LDLCALC 48  TRIG 82  CHOLHDL 1.9   Cardiac Imaging:    LHC 07/12/23: Dominance: Right   Medications:    Scheduled Medications:  aspirin  EC  81 mg Oral Daily   Chlorhexidine  Gluconate Cloth  6 each Topical Daily   clopidogrel   75 mg Oral Daily   digoxin   0.0625 mg Oral Daily   empagliflozin   10 mg Oral QAC breakfast   enoxaparin  (LOVENOX ) injection  40 mg Subcutaneous Q24H   folic  acid  1 mg Oral Daily   multivitamin with minerals  1 tablet Oral Daily   rosuvastatin   20 mg Oral Daily   sodium chloride  flush  10-40 mL Intracatheter Q12H   sodium chloride  flush  3 mL Intravenous Q12H   [START ON 07/14/2024] spironolactone   25 mg Oral Daily   thiamine   100 mg Oral Daily    Infusions:  milrinone  0.25 mcg/kg/min (07/13/24 0636)    PRN Medications: acetaminophen  **OR** acetaminophen , levalbuterol , LORazepam , ondansetron  (ZOFRAN ) IV, sodium chloride  flush  Assessment/Plan   Elevated Troponin CAD - Nonobstructive CAD on 7/25 cath.   - Admitted with CP.  - Hs trop 1,513>>1,613>>2,107. Cocaine + on admit  - Coronary angiography showed 80% RCA stenosis, possible culprit for NSTEMI. Coronary disease, however, does not explain his cardiomyopathy. He had low output and markedly elevated filling pressures on RHC so it was decided to stage intervention after HF  optimized.  - Plavix  loaded, continue 75 mg daily.  - Plan to return to cath lab for PCI to RCA on Monday.  - continue ASA, statin - on lovenox  for DVT ppx   Acute on chronic systolic CHF: Nonischemic cardiomyopathy. Echo in 5/25 showed EF < 20%, RV nl, global HK, mid-mod MR. RHC/LHC in 5/25 showed non-obstructive CAD, well-compensated filling pressures with CI 2.07.  Cardiac MRI in 5/25 showed LV EF 18%, RV EF 35%, nonspecific RV insertion site LGE.  Echo 9/25: EF 15-20%, RV mildly reduced, RVSP 55 mmHg, severe MR, severe TR, dilated IVC.  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 Echo 12/25: EF <20%, LV with GHK, RV normal, LA mod dilated, mild MR.  Significant RCA disease on cath, but this does not explain his long-standing cardiomyopathy.  - NYHA II at admission, but hemodynamics on RHC much worse than would be expected with symptoms.  - RHC 1/8: markedly elevated right and left heart filling pressures with low CI (1.5 F/1.6 T) and PAPi 1.2.  - Coox 63% on milrinone  0.25 mcg/kg/min.  Will continue milrinone  until after PCI then wean off.  - CVP low after good diuresis on IV Lasix , now off diuretics. Would start on torsemide  20 mg daily when CVP starts to trend back up.  - Continue digoxin  0.0625 daily. - Continue Jardiance  10 daily - Increase spironolactone  to 25 mg daily.  - Entresto  on hold for now with elevated creatinine, would like to restart after PCI on Monday.  - No beta blocker with low output.  - Poor candidate for advanced therapies with cocaine use (positive this admission), ETOH, and social situation. Compliance had been improved with paramedicine, until lost job and stable housing.  He is now homeless.   - EF remains <35% with LBBB-like IVCD.  Ideally would get CRT-D. Has been referred to EP though ongoing cocaine use will be a problem.    LBBB - QRS >150s on ECG and tele   AKI on CKD stage 3b: Baseline ~1.5-1.6 - Suspect cardiorenal syndrome.  -  Creatinine trending down again, 1.62 today.    Cocaine use Tobacco use ETOH use - Recently drank ETOH again because he was stressed.  - Denies cocaine use, says he only touches it to make money - UDS + cocaine and opiates - Smokes ~3 cigarettes a day - Cessation of all advised    SDOH:  - poor health literacy - Enrolled in paramedicine, compliance much improved - TOC consult to assist with disability application - Awaiting housing at [employer],  social work has been consulted.    Ezra Shuck 07/13/2024 9:51 AM

## 2024-07-14 DIAGNOSIS — I5023 Acute on chronic systolic (congestive) heart failure: Secondary | ICD-10-CM | POA: Diagnosis not present

## 2024-07-14 LAB — BASIC METABOLIC PANEL WITH GFR
Anion gap: 10 (ref 5–15)
BUN: 20 mg/dL (ref 6–20)
CO2: 26 mmol/L (ref 22–32)
Calcium: 9.4 mg/dL (ref 8.9–10.3)
Chloride: 101 mmol/L (ref 98–111)
Creatinine, Ser: 1.37 mg/dL — ABNORMAL HIGH (ref 0.61–1.24)
GFR, Estimated: 59 mL/min — ABNORMAL LOW
Glucose, Bld: 88 mg/dL (ref 70–99)
Potassium: 4.7 mmol/L (ref 3.5–5.1)
Sodium: 136 mmol/L (ref 135–145)

## 2024-07-14 LAB — CBC
HCT: 41.4 % (ref 39.0–52.0)
Hemoglobin: 13.2 g/dL (ref 13.0–17.0)
MCH: 23.9 pg — ABNORMAL LOW (ref 26.0–34.0)
MCHC: 31.9 g/dL (ref 30.0–36.0)
MCV: 75 fL — ABNORMAL LOW (ref 80.0–100.0)
Platelets: 209 K/uL (ref 150–400)
RBC: 5.52 MIL/uL (ref 4.22–5.81)
RDW: 15.9 % — ABNORMAL HIGH (ref 11.5–15.5)
WBC: 9.4 K/uL (ref 4.0–10.5)
nRBC: 0 % (ref 0.0–0.2)

## 2024-07-14 LAB — COOXEMETRY PANEL
Carboxyhemoglobin: 1.8 % — ABNORMAL HIGH (ref 0.5–1.5)
Methemoglobin: <0.7 % (ref 0.0–1.5)
O2 Saturation: 65.4 %
Total hemoglobin: 13.6 g/dL (ref 12.0–16.0)

## 2024-07-14 NOTE — Plan of Care (Signed)
" °  Problem: Education: Goal: Knowledge of General Education information will improve Description: Including pain rating scale, medication(s)/side effects and non-pharmacologic comfort measures Outcome: Progressing   Problem: Health Behavior/Discharge Planning: Goal: Ability to manage health-related needs will improve Outcome: Progressing   Problem: Clinical Measurements: Goal: Ability to maintain clinical measurements within normal limits will improve Outcome: Progressing Goal: Will remain free from infection Outcome: Progressing Goal: Diagnostic test results will improve Outcome: Progressing Goal: Respiratory complications will improve Outcome: Progressing Goal: Cardiovascular complication will be avoided Outcome: Progressing   Problem: Activity: Goal: Risk for activity intolerance will decrease Outcome: Progressing   Problem: Nutrition: Goal: Adequate nutrition will be maintained Outcome: Progressing   Problem: Coping: Goal: Level of anxiety will decrease Outcome: Progressing   Problem: Elimination: Goal: Will not experience complications related to bowel motility Outcome: Progressing Goal: Will not experience complications related to urinary retention Outcome: Progressing   Problem: Pain Managment: Goal: General experience of comfort will improve and/or be controlled Outcome: Progressing   Problem: Skin Integrity: Goal: Risk for impaired skin integrity will decrease Outcome: Progressing   Problem: Education: Goal: Understanding of CV disease, CV risk reduction, and recovery process will improve Outcome: Progressing Goal: Individualized Educational Video(s) Outcome: Progressing   Problem: Activity: Goal: Ability to return to baseline activity level will improve Outcome: Progressing   Problem: Cardiovascular: Goal: Ability to achieve and maintain adequate cardiovascular perfusion will improve Outcome: Progressing Goal: Vascular access site(s) Level 0-1 will  be maintained Outcome: Progressing   "

## 2024-07-14 NOTE — H&P (View-Only) (Signed)
 Patient ID: ACEY WOODFIELD, male   DOB: [birth year], 59 y.o.   MRN: [id number]     Advanced Heart Failure Rounding Note  Cardiologist: Soyla DELENA Merck, MD  [facility] Cardiologist: Dr. Cherrie  Chief Complaint: Low-output HF Patient Profile   MYKEL SPONAUGLE is a 59 y.o. male with h/o  tobacco, cocaine and alcohol use, chronic systolic heart failure and known CAD (diffuse moderate, nonobstructive dz by cath 5/25), presented with significantly elevated troponin and active chest pain concerning for NSTEMI. UDS + for cocaine.   Significant events:   1/8: R/LHC: see imaging below. RA 18, PA 62/41 (45), PCW 31, TD CO/CI 2.7/1.62, PAPi 1.2   Subjective:    Co-ox 65% on milrinone  0.25 mcg/kg/min. CVP 4.   sCr 1.47>2.11>1.89>1.62>pending  No chest pain or dyspnea.    Objective:    Weight Range: 58.9 kg Body mass index is 20.96 kg/m.   Vital Signs:   Temp:  [97.6 F (36.4 C)-98.9 F (37.2 C)] 98.4 F (36.9 C) (01/11 0721) Pulse Rate:  [91-97] 93 (01/11 0928) Resp:  [15-20] 17 (01/11 0721) BP: (105-114)/(68-84) 110/68 (01/11 0721) SpO2:  [97 %-99 %] 98 % (01/11 0721) Weight:  [58.9 kg] 58.9 kg (01/11 0448) Last BM Date : 07/13/24  Weight change: Filed Weights   07/12/24 0500 07/13/24 0504 07/14/24 0448  Weight: 57.1 kg 58 kg 58.9 kg   Intake/Output:  Intake/Output Summary (Last 24 hours) at 07/14/2024 0956 Last data filed at 07/14/2024 9277 Gross per 24 hour  Intake 920 ml  Output 2050 ml  Net -1130 ml   Physical Exam   General: NAD Neck: No JVD, no thyromegaly or thyroid nodule.  Lungs: Clear to auscultation bilaterally with normal respiratory effort. CV: Nondisplaced PMI.  Heart regular S1/S2, no S3/S4, no murmur.  No peripheral edema.    Abdomen: Soft, nontender, no hepatosplenomegaly, no distention.  Skin: Intact without lesions or rashes.  Neurologic: Alert and oriented x 3.  Psych: Normal affect. Extremities: No clubbing or cyanosis.  HEENT: Normal.     Telemetry   NSR 90s (personally reviewed)  Labs   CBC Recent Labs    07/13/24 0445 07/14/24 0450  WBC 9.8 9.4  HGB 13.8 13.2  HCT 42.5 41.4  MCV 74.2* 75.0*  PLT 179 209   Basic Metabolic Panel Recent Labs    98/91/73 1951 07/12/24 0342 07/13/24 0445  NA 137 137 132*  K 4.3 4.1 3.9  CL 95* 97* 95*  CO2 29 24 26   GLUCOSE 131* 135* 173*  BUN 23* 24* 25*  CREATININE 2.11* 1.89* 1.62*  CALCIUM  9.5 9.1 9.2  MG 2.4  --   --    Liver Function Tests Recent Labs    07/12/24 0342  AST 30  ALT 17  ALKPHOS 98  BILITOT 0.5  PROT 8.0  ALBUMIN 3.9   BNP (last 3 results) Recent Labs    04/01/24 1327 04/12/24 1014 05/21/24 1004  BNP >4,500.0* 3,217.5* 3,013.7*   ProBNP (last 3 results) Recent Labs    06/23/24 2241 06/30/24 1232 07/08/24 2224  PROBNP 11,603.0* 3,740.0* 8,698.0*   Fasting Lipid Panel No results for input(s): CHOL, HDL, LDLCALC, TRIG, CHOLHDL, LDLDIRECT in the last 72 hours.  Cardiac Imaging:    LHC 07/12/23: Dominance: Right   Medications:    Scheduled Medications:  aspirin  EC  81 mg Oral Daily   Chlorhexidine  Gluconate Cloth  6 each Topical Daily   clopidogrel   75 mg Oral Daily   digoxin   0.0625 mg Oral Daily   empagliflozin   10 mg Oral QAC breakfast   enoxaparin  (LOVENOX ) injection  40 mg Subcutaneous Q24H   folic acid   1 mg Oral Daily   multivitamin with minerals  1 tablet Oral Daily   rosuvastatin   20 mg Oral Daily   sodium chloride  flush  10-40 mL Intracatheter Q12H   sodium chloride  flush  3 mL Intravenous Q12H   spironolactone   25 mg Oral Daily   thiamine   100 mg Oral Daily    Infusions:  milrinone  0.25 mcg/kg/min (07/14/24 0448)    PRN Medications: acetaminophen  **OR** acetaminophen , levalbuterol , LORazepam , ondansetron  (ZOFRAN ) IV, sodium chloride  flush  Assessment/Plan   Elevated Troponin CAD - Nonobstructive CAD on 7/25 cath.   - Admitted with CP.  - Hs trop 1,513>>1,613>>2,107. Cocaine + on  admit  - Coronary angiography showed 80% RCA stenosis, possible culprit for NSTEMI. Coronary disease, however, does not explain his cardiomyopathy. He had low output and markedly elevated filling pressures on RHC so it was decided to stage intervention after HF optimized.  - Plavix  loaded, continue 75 mg daily.  - Plan to return to cath lab for PCI to RCA on Monday.  - continue ASA, statin - on lovenox  for DVT ppx   Acute on chronic systolic CHF: Nonischemic cardiomyopathy. Echo in 5/25 showed EF < 20%, RV nl, global HK, mid-mod MR. RHC/LHC in 5/25 showed non-obstructive CAD, well-compensated filling pressures with CI 2.07.  Cardiac MRI in 5/25 showed LV EF 18%, RV EF 35%, nonspecific RV insertion site LGE.  Echo 9/25: EF 15-20%, RV mildly reduced, RVSP 55 mmHg, severe MR, severe TR, dilated IVC.  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 Echo 12/25: EF <20%, LV with GHK, RV normal, LA mod dilated, mild MR.  Significant RCA disease on cath, but this does not explain his long-standing cardiomyopathy.  - NYHA II at admission, but hemodynamics on RHC much worse than would be expected with symptoms.  - RHC 1/8: markedly elevated right and left heart filling pressures with low CI (1.5 F/1.6 T) and PAPi 1.2.  - Coox 63% on milrinone  0.25 mcg/kg/min.  Will continue milrinone  until after PCI then wean off.  - CVP low after good diuresis on IV Lasix , now off diuretics. Would start on torsemide  20 mg daily when CVP starts to trend back up (4 today, will not give).  - Continue digoxin  0.0625 daily. - Continue Jardiance  10 daily - Continue spironolactone  25 mg daily.  - Entresto  on hold for now with elevated creatinine, would like to restart after PCI on Monday.  - No beta blocker with low output.  - Poor candidate for advanced therapies with cocaine use (positive this admission), ETOH, and social situation. Compliance had been improved with paramedicine, until lost job and stable  housing.  He is now homeless.   - EF remains <35% with LBBB-like IVCD.  Ideally would get CRT-D. Has been referred to EP though ongoing cocaine use will be a problem.    LBBB - QRS >150s on ECG and tele   AKI on CKD stage 3b: Baseline ~1.5-1.6 - Suspect cardiorenal syndrome.  - Creatinine trending down again, 1.62 yesterday.  - Needs BMET today.    Cocaine use Tobacco use ETOH use - Recently drank ETOH again because he was stressed.  - Denies cocaine use, says he only touches it to make money - UDS + cocaine and opiates - Smokes ~3 cigarettes a day - Cessation of  all advised    SDOH:  - poor health literacy - Enrolled in paramedicine, compliance much improved - TOC consult to assist with disability application - Awaiting housing at [employer], social work has been consulted.    Ezra Shuck 07/14/2024 9:56 AM

## 2024-07-14 NOTE — Progress Notes (Signed)
 Patient ID: ACEY WOODFIELD, male   DOB: [birth year], 59 y.o.   MRN: [id number]     Advanced Heart Failure Rounding Note  Cardiologist: Soyla DELENA Merck, MD  [facility] Cardiologist: Dr. Cherrie  Chief Complaint: Low-output HF Patient Profile   MYKEL SPONAUGLE is a 59 y.o. male with h/o  tobacco, cocaine and alcohol use, chronic systolic heart failure and known CAD (diffuse moderate, nonobstructive dz by cath 5/25), presented with significantly elevated troponin and active chest pain concerning for NSTEMI. UDS + for cocaine.   Significant events:   1/8: R/LHC: see imaging below. RA 18, PA 62/41 (45), PCW 31, TD CO/CI 2.7/1.62, PAPi 1.2   Subjective:    Co-ox 65% on milrinone  0.25 mcg/kg/min. CVP 4.   sCr 1.47>2.11>1.89>1.62>pending  No chest pain or dyspnea.    Objective:    Weight Range: 58.9 kg Body mass index is 20.96 kg/m.   Vital Signs:   Temp:  [97.6 F (36.4 C)-98.9 F (37.2 C)] 98.4 F (36.9 C) (01/11 0721) Pulse Rate:  [91-97] 93 (01/11 0928) Resp:  [15-20] 17 (01/11 0721) BP: (105-114)/(68-84) 110/68 (01/11 0721) SpO2:  [97 %-99 %] 98 % (01/11 0721) Weight:  [58.9 kg] 58.9 kg (01/11 0448) Last BM Date : 07/13/24  Weight change: Filed Weights   07/12/24 0500 07/13/24 0504 07/14/24 0448  Weight: 57.1 kg 58 kg 58.9 kg   Intake/Output:  Intake/Output Summary (Last 24 hours) at 07/14/2024 0956 Last data filed at 07/14/2024 9277 Gross per 24 hour  Intake 920 ml  Output 2050 ml  Net -1130 ml   Physical Exam   General: NAD Neck: No JVD, no thyromegaly or thyroid nodule.  Lungs: Clear to auscultation bilaterally with normal respiratory effort. CV: Nondisplaced PMI.  Heart regular S1/S2, no S3/S4, no murmur.  No peripheral edema.    Abdomen: Soft, nontender, no hepatosplenomegaly, no distention.  Skin: Intact without lesions or rashes.  Neurologic: Alert and oriented x 3.  Psych: Normal affect. Extremities: No clubbing or cyanosis.  HEENT: Normal.     Telemetry   NSR 90s (personally reviewed)  Labs   CBC Recent Labs    07/13/24 0445 07/14/24 0450  WBC 9.8 9.4  HGB 13.8 13.2  HCT 42.5 41.4  MCV 74.2* 75.0*  PLT 179 209   Basic Metabolic Panel Recent Labs    98/91/73 1951 07/12/24 0342 07/13/24 0445  NA 137 137 132*  K 4.3 4.1 3.9  CL 95* 97* 95*  CO2 29 24 26   GLUCOSE 131* 135* 173*  BUN 23* 24* 25*  CREATININE 2.11* 1.89* 1.62*  CALCIUM  9.5 9.1 9.2  MG 2.4  --   --    Liver Function Tests Recent Labs    07/12/24 0342  AST 30  ALT 17  ALKPHOS 98  BILITOT 0.5  PROT 8.0  ALBUMIN 3.9   BNP (last 3 results) Recent Labs    04/01/24 1327 04/12/24 1014 05/21/24 1004  BNP >4,500.0* 3,217.5* 3,013.7*   ProBNP (last 3 results) Recent Labs    06/23/24 2241 06/30/24 1232 07/08/24 2224  PROBNP 11,603.0* 3,740.0* 8,698.0*   Fasting Lipid Panel No results for input(s): CHOL, HDL, LDLCALC, TRIG, CHOLHDL, LDLDIRECT in the last 72 hours.  Cardiac Imaging:    LHC 07/12/23: Dominance: Right   Medications:    Scheduled Medications:  aspirin  EC  81 mg Oral Daily   Chlorhexidine  Gluconate Cloth  6 each Topical Daily   clopidogrel   75 mg Oral Daily   digoxin   0.0625 mg Oral Daily   empagliflozin   10 mg Oral QAC breakfast   enoxaparin  (LOVENOX ) injection  40 mg Subcutaneous Q24H   folic acid   1 mg Oral Daily   multivitamin with minerals  1 tablet Oral Daily   rosuvastatin   20 mg Oral Daily   sodium chloride  flush  10-40 mL Intracatheter Q12H   sodium chloride  flush  3 mL Intravenous Q12H   spironolactone   25 mg Oral Daily   thiamine   100 mg Oral Daily    Infusions:  milrinone  0.25 mcg/kg/min (07/14/24 0448)    PRN Medications: acetaminophen  **OR** acetaminophen , levalbuterol , LORazepam , ondansetron  (ZOFRAN ) IV, sodium chloride  flush  Assessment/Plan   Elevated Troponin CAD - Nonobstructive CAD on 7/25 cath.   - Admitted with CP.  - Hs trop 1,513>>1,613>>2,107. Cocaine + on  admit  - Coronary angiography showed 80% RCA stenosis, possible culprit for NSTEMI. Coronary disease, however, does not explain his cardiomyopathy. He had low output and markedly elevated filling pressures on RHC so it was decided to stage intervention after HF optimized.  - Plavix  loaded, continue 75 mg daily.  - Plan to return to cath lab for PCI to RCA on Monday.  - continue ASA, statin - on lovenox  for DVT ppx   Acute on chronic systolic CHF: Nonischemic cardiomyopathy. Echo in 5/25 showed EF < 20%, RV nl, global HK, mid-mod MR. RHC/LHC in 5/25 showed non-obstructive CAD, well-compensated filling pressures with CI 2.07.  Cardiac MRI in 5/25 showed LV EF 18%, RV EF 35%, nonspecific RV insertion site LGE.  Echo 9/25: EF 15-20%, RV mildly reduced, RVSP 55 mmHg, severe MR, severe TR, dilated IVC.  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 Echo 12/25: EF <20%, LV with GHK, RV normal, LA mod dilated, mild MR.  Significant RCA disease on cath, but this does not explain his long-standing cardiomyopathy.  - NYHA II at admission, but hemodynamics on RHC much worse than would be expected with symptoms.  - RHC 1/8: markedly elevated right and left heart filling pressures with low CI (1.5 F/1.6 T) and PAPi 1.2.  - Coox 63% on milrinone  0.25 mcg/kg/min.  Will continue milrinone  until after PCI then wean off.  - CVP low after good diuresis on IV Lasix , now off diuretics. Would start on torsemide  20 mg daily when CVP starts to trend back up (4 today, will not give).  - Continue digoxin  0.0625 daily. - Continue Jardiance  10 daily - Continue spironolactone  25 mg daily.  - Entresto  on hold for now with elevated creatinine, would like to restart after PCI on Monday.  - No beta blocker with low output.  - Poor candidate for advanced therapies with cocaine use (positive this admission), ETOH, and social situation. Compliance had been improved with paramedicine, until lost job and stable  housing.  He is now homeless.   - EF remains <35% with LBBB-like IVCD.  Ideally would get CRT-D. Has been referred to EP though ongoing cocaine use will be a problem.    LBBB - QRS >150s on ECG and tele   AKI on CKD stage 3b: Baseline ~1.5-1.6 - Suspect cardiorenal syndrome.  - Creatinine trending down again, 1.62 yesterday.  - Needs BMET today.    Cocaine use Tobacco use ETOH use - Recently drank ETOH again because he was stressed.  - Denies cocaine use, says he only touches it to make money - UDS + cocaine and opiates - Smokes ~3 cigarettes a day - Cessation of  all advised    SDOH:  - poor health literacy - Enrolled in paramedicine, compliance much improved - TOC consult to assist with disability application - Awaiting housing at [employer], social work has been consulted.    Ezra Shuck 07/14/2024 9:56 AM

## 2024-07-15 ENCOUNTER — Encounter (HOSPITAL_COMMUNITY): Payer: Self-pay | Admitting: Internal Medicine

## 2024-07-15 ENCOUNTER — Encounter (HOSPITAL_COMMUNITY): Admission: EM | Disposition: A | Payer: Self-pay | Source: Home / Self Care | Attending: Internal Medicine

## 2024-07-15 ENCOUNTER — Ambulatory Visit (HOSPITAL_COMMUNITY)

## 2024-07-15 ENCOUNTER — Telehealth: Payer: Self-pay

## 2024-07-15 DIAGNOSIS — I5023 Acute on chronic systolic (congestive) heart failure: Secondary | ICD-10-CM | POA: Diagnosis not present

## 2024-07-15 HISTORY — PX: LEFT HEART CATH: CATH118248

## 2024-07-15 HISTORY — PX: CORONARY STENT INTERVENTION: CATH118234

## 2024-07-15 LAB — COOXEMETRY PANEL
Carboxyhemoglobin: 2.1 % — ABNORMAL HIGH (ref 0.5–1.5)
Methemoglobin: 0.7 % (ref 0.0–1.5)
O2 Saturation: 50.7 %
Total hemoglobin: 14 g/dL (ref 12.0–16.0)

## 2024-07-15 LAB — CBC
HCT: 42.1 % (ref 39.0–52.0)
Hemoglobin: 13.2 g/dL (ref 13.0–17.0)
MCH: 23.7 pg — ABNORMAL LOW (ref 26.0–34.0)
MCHC: 31.4 g/dL (ref 30.0–36.0)
MCV: 75.6 fL — ABNORMAL LOW (ref 80.0–100.0)
Platelets: 218 K/uL (ref 150–400)
RBC: 5.57 MIL/uL (ref 4.22–5.81)
RDW: 15.8 % — ABNORMAL HIGH (ref 11.5–15.5)
WBC: 8.7 K/uL (ref 4.0–10.5)
nRBC: 0 % (ref 0.0–0.2)

## 2024-07-15 LAB — BASIC METABOLIC PANEL WITH GFR
Anion gap: 10 (ref 5–15)
BUN: 20 mg/dL (ref 6–20)
CO2: 23 mmol/L (ref 22–32)
Calcium: 9 mg/dL (ref 8.9–10.3)
Chloride: 103 mmol/L (ref 98–111)
Creatinine, Ser: 1.41 mg/dL — ABNORMAL HIGH (ref 0.61–1.24)
GFR, Estimated: 57 mL/min — ABNORMAL LOW
Glucose, Bld: 133 mg/dL — ABNORMAL HIGH (ref 70–99)
Potassium: 4.8 mmol/L (ref 3.5–5.1)
Sodium: 136 mmol/L (ref 135–145)

## 2024-07-15 LAB — POCT ACTIVATED CLOTTING TIME
Activated Clotting Time: 276 s
Activated Clotting Time: 634 s
Activated Clotting Time: >1000 s

## 2024-07-15 MED ORDER — LABETALOL HCL 5 MG/ML IV SOLN: 10.0000 mg | INTRAVENOUS | Status: DC | PRN

## 2024-07-15 MED ORDER — IOHEXOL 350 MG/ML SOLN
INTRAVENOUS | Status: DC | PRN
  Administered 2024-07-15: 56 mL

## 2024-07-15 MED ORDER — LIDOCAINE HCL (PF) 1 % IJ SOLN
INTRAMUSCULAR | Status: DC | PRN
  Administered 2024-07-15: 5 mL

## 2024-07-15 MED ORDER — LIDOCAINE HCL (PF) 1 % IJ SOLN
INTRAMUSCULAR | Status: AC
  Filled 2024-07-15: qty 30

## 2024-07-15 MED ORDER — SODIUM CHLORIDE 0.9 % IV SOLN: 250.0000 mL | INTRAVENOUS | Status: DC | PRN

## 2024-07-15 MED ORDER — SODIUM CHLORIDE 0.9% FLUSH: 3.0000 mL | INTRAVENOUS | Status: DC | PRN

## 2024-07-15 MED ORDER — HEPARIN SODIUM (PORCINE) 1000 UNIT/ML IJ SOLN
INTRAMUSCULAR | Status: AC
  Filled 2024-07-15: qty 10

## 2024-07-15 MED ORDER — FENTANYL CITRATE (PF) 100 MCG/2ML IJ SOLN
INTRAMUSCULAR | Status: DC | PRN
  Administered 2024-07-15: 25 ug via INTRAVENOUS

## 2024-07-15 MED ORDER — HEPARIN (PORCINE) IN NACL 1000-0.9 UT/500ML-% IV SOLN
INTRAVENOUS | Status: DC | PRN
  Administered 2024-07-15 (×2): 500 mL

## 2024-07-15 MED ORDER — FENTANYL CITRATE (PF) 100 MCG/2ML IJ SOLN
INTRAMUSCULAR | Status: AC
  Filled 2024-07-15: qty 2

## 2024-07-15 MED ORDER — CLOPIDOGREL BISULFATE 75 MG PO TABS
75.0000 mg | ORAL_TABLET | Freq: Every day | ORAL | Status: DC
  Administered 2024-07-16 – 2024-07-24 (×9): 75 mg via ORAL
  Filled 2024-07-15 (×9): qty 1

## 2024-07-15 MED ORDER — VERAPAMIL HCL 2.5 MG/ML IV SOLN
INTRAVENOUS | Status: DC | PRN
  Administered 2024-07-15: 5 mg via INTRA_ARTERIAL

## 2024-07-15 MED ORDER — HEPARIN SODIUM (PORCINE) 1000 UNIT/ML IJ SOLN
INTRAMUSCULAR | Status: DC | PRN
  Administered 2024-07-15: 5000 [IU] via INTRA_ARTERIAL
  Administered 2024-07-15: 3000 [IU] via INTRAVENOUS

## 2024-07-15 MED ORDER — TORSEMIDE 20 MG PO TABS
20.0000 mg | ORAL_TABLET | Freq: Every day | ORAL | Status: DC
  Administered 2024-07-16: 20 mg via ORAL
  Filled 2024-07-15: qty 1

## 2024-07-15 MED ORDER — HYDRALAZINE HCL 20 MG/ML IJ SOLN: 10.0000 mg | INTRAMUSCULAR | Status: AC | PRN

## 2024-07-15 MED ORDER — MIDAZOLAM HCL 2 MG/2ML IJ SOLN
INTRAMUSCULAR | Status: AC
  Filled 2024-07-15: qty 2

## 2024-07-15 MED ORDER — VERAPAMIL HCL 2.5 MG/ML IV SOLN
INTRAVENOUS | Status: AC
  Filled 2024-07-15: qty 2

## 2024-07-15 MED ORDER — SODIUM CHLORIDE 0.9 % IV SOLN: 250.0000 mL | INTRAVENOUS | Status: AC | PRN

## 2024-07-15 MED ORDER — ASPIRIN 81 MG PO CHEW: 81.0000 mg | CHEWABLE_TABLET | Freq: Every day | ORAL | Status: DC

## 2024-07-15 MED ORDER — ACETAMINOPHEN 325 MG PO TABS: 650.0000 mg | ORAL_TABLET | ORAL | Status: DC | PRN

## 2024-07-15 MED ORDER — SODIUM CHLORIDE 0.9% FLUSH: 3.0000 mL | Freq: Two times a day (BID) | INTRAVENOUS | Status: DC

## 2024-07-15 MED ORDER — SODIUM CHLORIDE 0.9% FLUSH
3.0000 mL | Freq: Two times a day (BID) | INTRAVENOUS | Status: DC
  Administered 2024-07-15 – 2024-07-24 (×12): 3 mL via INTRAVENOUS

## 2024-07-15 MED ORDER — MIDAZOLAM HCL (PF) 2 MG/2ML IJ SOLN
INTRAMUSCULAR | Status: DC | PRN
  Administered 2024-07-15: 1 mg via INTRAVENOUS

## 2024-07-15 NOTE — Plan of Care (Signed)
" °  Problem: Education: Goal: Knowledge of General Education information will improve Description: Including pain rating scale, medication(s)/side effects and non-pharmacologic comfort measures Outcome: Progressing   Problem: Health Behavior/Discharge Planning: Goal: Ability to manage health-related needs will improve Outcome: Progressing   Problem: Clinical Measurements: Goal: Ability to maintain clinical measurements within normal limits will improve Outcome: Progressing Goal: Will remain free from infection Outcome: Progressing Goal: Diagnostic test results will improve Outcome: Progressing Goal: Respiratory complications will improve Outcome: Progressing Goal: Cardiovascular complication will be avoided Outcome: Progressing   Problem: Activity: Goal: Risk for activity intolerance will decrease Outcome: Progressing   Problem: Nutrition: Goal: Adequate nutrition will be maintained Outcome: Progressing   Problem: Coping: Goal: Level of anxiety will decrease Outcome: Progressing   Problem: Pain Managment: Goal: General experience of comfort will improve and/or be controlled Outcome: Progressing   Problem: Safety: Goal: Ability to remain free from injury will improve Outcome: Progressing   Problem: Education: Goal: Understanding of CV disease, CV risk reduction, and recovery process will improve Outcome: Progressing Goal: Individualized Educational Video(s) Outcome: Progressing   Problem: Activity: Goal: Ability to return to baseline activity level will improve Outcome: Progressing   Problem: Cardiovascular: Goal: Ability to achieve and maintain adequate cardiovascular perfusion will improve Outcome: Progressing Goal: Vascular access site(s) Level 0-1 will be maintained Outcome: Progressing   Problem: Health Behavior/Discharge Planning: Goal: Ability to safely manage health-related needs after discharge will improve Outcome: Progressing   Problem:  Education: Goal: Understanding of CV disease, CV risk reduction, and recovery process will improve Outcome: Progressing Goal: Individualized Educational Video(s) Outcome: Progressing   Problem: Activity: Goal: Ability to return to baseline activity level will improve Outcome: Progressing   "

## 2024-07-15 NOTE — Interval H&P Note (Signed)
 History and Physical Interval Note:  07/15/2024 9:59 AM  Jesse Key  has presented today for surgery, with the diagnosis of cad.  The various methods of treatment have been discussed with the patient and family. After consideration of risks, benefits and other options for treatment, the patient has consented to  Procedures: CORONARY STENT INTERVENTION (N/A) as a surgical intervention.  The patient's history has been reviewed, patient examined, no change in status, stable for surgery.  I have reviewed the patient's chart and labs.  Questions were answered to the patient's satisfaction.     Marc Sivertsen K Bray Vickerman

## 2024-07-15 NOTE — Plan of Care (Signed)

## 2024-07-15 NOTE — Progress Notes (Addendum)
 Patient ID: Jesse Key, male   DOB: 1966-06-05, 59 y.o.   MRN: [id number]     Advanced Heart Failure Rounding Note  Cardiologist: Soyla DELENA Merck, MD  [facility] Cardiologist: Dr. Cherrie  Chief Complaint: Low-output HF Patient Profile   LUX MEADERS is a 59 y.o. male with h/o  tobacco, cocaine and alcohol use, chronic systolic heart failure and known CAD (diffuse moderate, nonobstructive dz by cath 5/25), presented with significantly elevated troponin and active chest pain concerning for NSTEMI. UDS + for cocaine.   Significant events:   1/8: R/LHC: see imaging below. RA 18, PA 62/41 (45), PCW 31, TD CO/CI 2.7/1.62, PAPi 1.2   Subjective:    Remains on Milrinone  0.25. Co-ox pending. CVP 7  SCr 1.47>2.11>1.89>1.62>1.37>>1.41 today   Denies CP. No resting dyspnea. Awaiting RCA PCI.    Objective:    Weight Range: 59.1 kg Body mass index is 21.03 kg/m.   Vital Signs:   Temp:  [97.6 F (36.4 C)-98.5 F (36.9 C)] 97.7 F (36.5 C) (01/12 0736) Pulse Rate:  [86-95] 92 (01/12 0736) Resp:  [14-22] 14 (01/12 0736) BP: (106-112)/(68-76) 112/76 (01/12 0736) SpO2:  [96 %-100 %] 100 % (01/12 0736) Weight:  [59.1 kg] 59.1 kg (01/12 0531) Last BM Date : 07/13/24  Weight change: Filed Weights   07/13/24 0504 07/14/24 0448 07/15/24 0531  Weight: 58 kg 58.9 kg 59.1 kg   Intake/Output:  Intake/Output Summary (Last 24 hours) at 07/15/2024 0853 Last data filed at 07/15/2024 0737 Gross per 24 hour  Intake 720 ml  Output 2700 ml  Net -1980 ml   Physical Exam   GENERAL: NAD Lungs- clear  CARDIAC:  JVP: 7-8 cm         Normal rate with regular rhythm. 2/6 MR murmur at apex, no LEE  ABDOMEN: Soft, non-tender, non-distended.  EXTREMITIES: Warm and well perfused. + LUE PICC  NEUROLOGIC: No obvious FND   Telemetry   NSR 90s (personally reviewed)  Labs   CBC Recent Labs    07/14/24 0450 07/15/24 0530  WBC 9.4 8.7  HGB 13.2 13.2  HCT 41.4 42.1  MCV 75.0* 75.6*  PLT  209 218   Basic Metabolic Panel Recent Labs    98/88/73 1109 07/15/24 0530  NA 136 136  K 4.7 4.8  CL 101 103  CO2 26 23  GLUCOSE 88 133*  BUN 20 20  CREATININE 1.37* 1.41*  CALCIUM  9.4 9.0   Liver Function Tests No results for input(s): AST, ALT, ALKPHOS, BILITOT, PROT, ALBUMIN in the last 72 hours.  BNP (last 3 results) Recent Labs    04/01/24 1327 04/12/24 1014 05/21/24 1004  BNP >4,500.0* 3,217.5* 3,013.7*   ProBNP (last 3 results) Recent Labs    06/23/24 2241 06/30/24 1232 07/08/24 2224  PROBNP 11,603.0* 3,740.0* 8,698.0*   Fasting Lipid Panel No results for input(s): CHOL, HDL, LDLCALC, TRIG, CHOLHDL, LDLDIRECT in the last 72 hours.  Cardiac Imaging:    LHC 07/12/23: Dominance: Right   Medications:    Scheduled Medications:  aspirin  EC  81 mg Oral Daily   Chlorhexidine  Gluconate Cloth  6 each Topical Daily   clopidogrel   75 mg Oral Daily   digoxin   0.0625 mg Oral Daily   empagliflozin   10 mg Oral QAC breakfast   enoxaparin  (LOVENOX ) injection  40 mg Subcutaneous Q24H   folic acid   1 mg Oral Daily   multivitamin with minerals  1 tablet Oral Daily   rosuvastatin   20 mg Oral  Daily   sodium chloride  flush  10-40 mL Intracatheter Q12H   sodium chloride  flush  3 mL Intravenous Q12H   spironolactone   25 mg Oral Daily   thiamine   100 mg Oral Daily    Infusions:  milrinone  0.25 mcg/kg/min (07/15/24 0328)    PRN Medications: acetaminophen  **OR** acetaminophen , levalbuterol , LORazepam , ondansetron  (ZOFRAN ) IV, sodium chloride  flush  Assessment/Plan   Elevated Troponin CAD - Nonobstructive CAD on 7/25 cath.   - Admitted with CP.  - Hs trop 1,513>>1,613>>2,107. Cocaine + on admit  - Coronary angiography showed 80% RCA stenosis, possible culprit for NSTEMI. Coronary disease, however, does not explain his cardiomyopathy. He had low output and markedly elevated filling pressures on RHC so it was decided to stage intervention  after HF optimized.  - Plavix  loaded, continue 75 mg daily.  - Plan to return to cath lab for PCI to RCA. Will check timing w/ cath lab  - continue ASA, statin - on lovenox  for DVT ppx   Acute on chronic systolic CHF: Nonischemic cardiomyopathy. Echo in 5/25 showed EF < 20%, RV nl, global HK, mid-mod MR. RHC/LHC in 5/25 showed non-obstructive CAD, well-compensated filling pressures with CI 2.07.  Cardiac MRI in 5/25 showed LV EF 18%, RV EF 35%, nonspecific RV insertion site LGE.  Echo 9/25: EF 15-20%, RV mildly reduced, RVSP 55 mmHg, severe MR, severe TR, dilated IVC.  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 Echo 12/25: EF <20%, LV with GHK, RV normal, LA mod dilated, mild MR.  Significant RCA disease on cath, but this does not explain his long-standing cardiomyopathy.  - NYHA II at admission, but hemodynamics on RHC much worse than would be expected with symptoms.  - RHC 1/8: markedly elevated right and left heart filling pressures with low CI (1.5 F/1.6 T) and PAPi 1.2.  - Remains on milrinone  0.25 mcg/kg/min. Co-ox pending.  Will continue milrinone  until after PCI then wean off.  - Good diuresis on IV Lasix , now off diuretics. CVP trending back up, 7 today. Start torsemide  20 mg daily  - Continue digoxin  0.0625 daily. - Continue Jardiance  10 daily - Continue spironolactone  25 mg daily.  - Entresto  on hold for now with elevated creatinine (now improved), would like to restart after PCI if SCr remains stable  - No beta blocker with low output.  - Poor candidate for advanced therapies with cocaine use (positive this admission), ETOH, and social situation. Compliance had been improved with paramedicine, until lost job and stable housing.  He is now homeless.   - EF remains <35% with LBBB-like IVCD.  Ideally would get CRT-D. Has been referred to EP though ongoing cocaine use will be a problem.    LBBB - QRS >150s on ECG and tele   AKI on CKD stage 3b: Baseline  ~1.5-1.6 - Suspect cardiorenal syndrome.  - Scr 1.4 today - follow BMP closely  - Continue Jardiance     Cocaine use Tobacco use ETOH use - Recently drank ETOH again because he was stressed.  - Denies cocaine use, says he only touches it to make money - UDS + cocaine and opiates - Smokes ~3 cigarettes a day - Cessation of all advised    SDOH:  - poor health literacy - Enrolled in paramedicine, compliance much improved - TOC consult to assist with disability application - Awaiting housing at [employer], social work has been consulted.    Caffie Shed, PA-C  07/15/2024 8:53 AM  Patient seen and  examined with the above-signed Advanced Practice Provider and/or Housestaff. I personally reviewed laboratory data, imaging studies and relevant notes. I independently examined the patient and formulated the important aspects of the plan. I have edited the note to reflect any of my changes or salient points. I have personally discussed the plan with the patient and/or family.  Denies CP or SOB   CVP trending up slowly. Co-ox ok  For PCI of RCA today  General:  Sitting up in bed. No resp difficulty HEENT: normal Neck: supple. No obvious JVP Cor: Regular rate & rhythm. No rubs, gallops or murmurs. Lungs: clear Abdomen: soft, nontender, nondistended.Good bowel sounds. Extremities: no cyanosis, clubbing, rash, edema Neuro: alert & orientedx3, cranial nerves grossly intact. moves all 4 extremities w/o difficulty. Affect pleasant  Looks good from HF perspective.   Planning for PCI of RCA today. Procedure discussed with him and confirmed with IC team.   Will hold on diuretics today prior to PCI. Restart tomorrow.   Toribio Fuel, MD  11:30 AM

## 2024-07-15 NOTE — Discharge Summary (Cosign Needed)
 Advanced Heart Failure Team  Discharge Summary   Patient ID: Jesse Key MRN: [id number], DOB/AGE: [birth year] 59 y.o. Admit date: 07/08/2024 D/C date:     07/24/2024   Primary Discharge Diagnoses:  NSTEMI/CAD s/p RCA PCI Acute on Chronic Systolic Heart Failure w/ Low output   Secondary Discharge Diagnoses:  AKI on CKD IIIb   Hospital Course:   Jesse Key is a 59 y.o. male with h/o tobacco, cocaine and alcohol use, CKD IIIb, known CAD (diffuse moderate, nonobstructive dz by cath 5/25) and chronic systolic heart failure (Echo 12/25 EF <20%, RV nl) who presented to the ED on 07/08/24 with significantly elevated troponin and active chest pain concerning for NSTEMI. UDS + for cocaine, though he adamantly denied recent use. Hs trop 1,513>>1,613>>2,107. EKG showed chronic LBBB. He was placed on heparin  gtt, ASA and high intensity statin. Initially felt to be volume overloaded and given dose of IV Lasix  and had bump in SCr from 1.4>>1.8. Underwent R/LHC on 1/9. Coronary angiography showed 80% RCA stenosis, felt to be culprit for NSTEMI. Coronary disease, however, does not explain his cardiomyopathy.  RHC demonstrated low output and markedly elevated filling pressures, RA 18, PA 62/41 (45), PCW 31, TD CO/CI 2.7/1.62, PAPi 1.2, thus it was decided to stage intervention to the RCA after better optimization from a HF standpoint. He was loaded w/ Plavix  and started on milrinone  0.25 mcg/kg/min and diuresed further w/ IV Lasix . After diuresis, AKI improved and SCr improved to 1.3. He underwent staged PCI to the RCA on 1/12.  Had successful PCI of mid right coronary artery w/ DES. There was small dissection of proximal right coronary artery likely due to guide trauma treated with a 3.0 x 8 mm Onyx Frontier stent. LVEDP 19. He will be continued on DAPT w/ ASA + Plavix  x 1 year followed by indefinite Plavix  monotherapy.   After PCI, milrinone  was weaned off.   Transitioned to PO diuretics and HF GDMT.  Has a  place to stay tonight. Approved for bed at homeless shelter 07/25/24 at 8 am.   Meds sent to [facility] pharmacy. PICC line removed.    Physical exam:  General:  well appearing.  No respiratory difficulty Neck: JVD flat cm.  Cor: Regular rate & rhythm. No murmurs. Lungs: clear Extremities: no edema  Neuro: alert & oriented x 3. Affect pleasant.   Assessment/Plan:  NSTEMI, CAD - CP on admit. Hs-trop 1,513>1,613>2,107. Cocaine +  - Nonobstructive CAD on 7/25 cath. Repeat this admission with 80% RCA (possible culprit). Possible that elevated troponins, were troponin leak related to low output heart failure and cocaine use. - S/p PCI with DES to RCA c/b dissection of pRCA treated with second DES - no s/s angina - continue ASA/plavix , DAPT 26yr - continue statin   Acute on chronic systolic CHF: Nonischemic cardiomyopathy. Echo in 5/25 showed EF < 20%, RV nl, global HK, mid-mod MR. R/LHC in 5/25 showed non-obstructive CAD, well-compensated filling pressures with CI 2.07.  CMR in 5/25 showed LV EF 18%, RV EF 35%, nonspecific RV insertion site LGE.  Echo 9/25: EF 15-20%, RV mildly reduced, RVSP 55 mmHg, severe MR, severe TR, dilated IVC.  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 Echo 12/25: EF <20%, LV with GHK, RV normal, LA mod dilated, mild MR. Significant RCA disease on cath, but this does not explain his long-standing cardiomyopathy. RHC 1/8 markedly elevated right and left heart filling pressures with low CI (1.5 F/1.6 T) and  PAPi 1.2. Started on milrinone . Underwent staged PCI to RCA as above.  - Stable off milrinone .  - Volume status looks good. Continue Torsemide  20 mg daily - Continue digoxin  0.0625 mg daily. Lvl ok.  - Continue jardiance  10 mg daily - Continue spironolactone  25 mg daily.  - BP too low to restart entresto  - No beta blocker with low output.  - Poor candidate for advanced therapies with social situation. Compliance had been improved with paramedicine,  until lost job and stable housing. He is now unhoused. Has a bed at homeless shelter that will be available to him tomorrow morning. Has a place to stay tonight.  - EF remains <35% with LBBB-like IVCD. Ideally would get CRT-D, given low output heart failure and lack of candidacy at this time for VAD. Have d/w EP - has outpatient appointment scheduled.    LBBB - QRS >150s on ECG and tele - plan as above   CKD stage 3b: Baseline had been ~1.5-1.6. - cardiorenal syndrome.  - on Jardiance     Cocaine use Tobacco use ETOH use - Recently drank ETOH again because he is stressed by social situation - UDS + cocaine and opiates - Smokes ~3 cigarettes a day - Cessation of all advised    Rectal Bleeding - BRBPR x 1 on 1/16  likely d/t hemorrhoids vs diverticular (noted on recent colonoscopy 12/25) - s/p resection of polyp (tubular adenoma) 12/25 - monitor H/H closely  - resolved - continue DAPT given fresh RCA stent   SDOH:  - poor health literacy - Enrolled in paramedicine, compliance much improved - Recently denied disability - Homeless. Bed at homeless shelter approved.    Discharge Vitals: Blood pressure 108/78, pulse 88, temperature 97.6 F (36.4 C), temperature source Oral, resp. rate 16, height 5' 6 (1.676 m), weight 59.5 kg, SpO2 99%.  Labs: Lab Results  Component Value Date   WBC 7.5 07/21/2024   HGB 13.2 07/21/2024   HCT 42.1 07/21/2024   MCV 75.9 (L) 07/21/2024   PLT 256 07/21/2024    Recent Labs  Lab 07/24/24 0453  NA 139  K 4.3  CL 102  CO2 26  BUN 30*  CREATININE 1.51*  CALCIUM  8.9  GLUCOSE 89   Lab Results  Component Value Date   CHOL 137 07/10/2024   HDL 73 07/10/2024   LDLCALC 48 07/10/2024   TRIG 82 07/10/2024   BNP (last 3 results) Recent Labs    04/01/24 1327 04/12/24 1014 05/21/24 1004  BNP >4,500.0* 3,217.5* 3,013.7*    ProBNP (last 3 results) Recent Labs    06/23/24 2241 06/30/24 1232 07/08/24 2224  PROBNP 11,603.0* 3,740.0*  8,698.0*     Diagnostic Studies/Procedures   1/8: R/LHC: see imaging below. Elevated filling pressures, low CI 1.62. Started on milrinone  0.25.  1/12: s/p PCI with DES to RCA c/b small dissection of pRCA s/p second DES  Discharge Medications   Allergies as of 07/24/2024       Reactions   Shrimp [shellfish Allergy] Anaphylaxis, Itching   ALL SHELLFISH   Shellfish Allergy Itching        Medication List     STOP taking these medications    furosemide  40 MG tablet Commonly known as: LASIX    metoprolol  succinate 25 MG 24 hr tablet Commonly known as: Toprol  XL   sacubitril -valsartan  24-26 MG Commonly known as: ENTRESTO        TAKE these medications    albuterol  108 (90 Base) MCG/ACT inhaler Commonly known as: VENTOLIN   HFA Inhale 1-2 puffs into the lungs every 6 (six) hours as needed for wheezing or shortness of breath.   aspirin  EC 81 MG tablet Take 1 tablet (81 mg total) by mouth daily. Swallow whole. Start taking on: July 25, 2024   CertaVite/Antioxidants Tabs Take 1 tablet by mouth daily. Start taking on: July 25, 2024   clopidogrel  75 MG tablet Commonly known as: PLAVIX  Take 1 tablet (75 mg total) by mouth daily with breakfast. Start taking on: July 25, 2024   Digoxin  62.5 MCG Tabs Take 62.5 mcg (1 tablet) by mouth daily. Start taking on: July 25, 2024 What changed: how much to take   empagliflozin  10 MG Tabs tablet Commonly known as: Jardiance  Take 1 tablet (10 mg total) by mouth daily before breakfast.   folic acid  1 MG tablet Commonly known as: FOLVITE  Take 1 tablet (1 mg total) by mouth daily. Start taking on: July 25, 2024   nicotine  14 mg/24hr patch Commonly known as: NICODERM CQ  - dosed in mg/24 hours Place 1 patch (14 mg total) onto the skin daily.   rosuvastatin  20 MG tablet Commonly known as: CRESTOR  Take 1 tablet (20 mg total) by mouth daily.   spironolactone  25 MG tablet Commonly known as: ALDACTONE  Take 1 tablet  (25 mg total) by mouth daily. Start taking on: July 25, 2024 What changed: how much to take   thiamine  100 MG tablet Commonly known as: VITAMIN B1 Take 1 tablet (100 mg total) by mouth daily. Start taking on: July 25, 2024   torsemide  20 MG tablet Commonly known as: DEMADEX  Take 1 tablet (20 mg total) by mouth daily.        Disposition   The patient will be discharged in stable condition to home. Discharge Instructions     Amb Referral to Cardiac Rehabilitation   Complete by: As directed    Diagnosis:  NSTEMI Coronary Stents     After initial evaluation and assessments completed: Virtual Based Care may be provided alone or in conjunction with Phase 2 Cardiac Rehab based on patient barriers.: Yes   Intensive Cardiac Rehabilitation (ICR) [facility] location only OR Traditional Cardiac Rehabilitation (TCR) *If criteria for ICR are not met will enroll in TCR [facility] only): Yes       Follow-up Information     [facility] Follow up on 07/31/2024.   Specialty: Cardiology Why: at 1:30 PM [facility], Entrance C Contact information: [address] [place]  [phone]                  APP Duration of Discharge Encounter: 15 minutes  Signed, Beckey LITTIE Coe  07/24/2024, 2:05 PM

## 2024-07-15 NOTE — Progress Notes (Signed)
 CARDIAC REHAB PHASE I   Post NSTEMI/stent/HF education and handouts provided. Education included restrictions, risk factors, exercise guidelines, antiplatelet therapy importance, MI booklet, NTG use, heart healthy diet, smoking cessation, and CRP2 reviewed. Patient awaiting potential housing at the [facility]. All questions and concerns addressed. Will refer to [facility] for CRP2.   2:10-2:47 Jesse JAYSON [place], RN BSN 07/15/2024 2:47 PM

## 2024-07-15 NOTE — Progress Notes (Unsigned)
 Complex Care Management Note Care Guide Note  07/15/2024 Name: Jesse Key MRN: [id number] DOB: 1965/07/22   Complex Care Management Outreach Attempts: An unsuccessful telephone outreach was attempted today to offer the patient information about available complex care management services.  Follow Up Plan:  Additional outreach attempts will be made to offer the patient complex care management information and services.   Encounter Outcome:  No Answer-Left voicemail  Leotis Rase [facility] Guide  Direct Dial: [phone]  Fax [phone]

## 2024-07-16 DIAGNOSIS — I5023 Acute on chronic systolic (congestive) heart failure: Secondary | ICD-10-CM | POA: Diagnosis not present

## 2024-07-16 LAB — CBC
HCT: 41.7 % (ref 39.0–52.0)
Hemoglobin: 13 g/dL (ref 13.0–17.0)
MCH: 23.5 pg — ABNORMAL LOW (ref 26.0–34.0)
MCHC: 31.2 g/dL (ref 30.0–36.0)
MCV: 75.4 fL — ABNORMAL LOW (ref 80.0–100.0)
Platelets: 240 K/uL (ref 150–400)
RBC: 5.53 MIL/uL (ref 4.22–5.81)
RDW: 15.7 % — ABNORMAL HIGH (ref 11.5–15.5)
WBC: 8.5 K/uL (ref 4.0–10.5)
nRBC: 0 % (ref 0.0–0.2)

## 2024-07-16 LAB — COOXEMETRY PANEL
Carboxyhemoglobin: 1.8 % — ABNORMAL HIGH (ref 0.5–1.5)
Carboxyhemoglobin: 1.7 % — ABNORMAL HIGH (ref 0.5–1.5)
Methemoglobin: <0.7 % (ref 0.0–1.5)
Methemoglobin: 0.7 % (ref 0.0–1.5)
O2 Saturation: 69.2 %
O2 Saturation: 55.4 %
Total hemoglobin: 13.3 g/dL (ref 12.0–16.0)
Total hemoglobin: 14.1 g/dL (ref 12.0–16.0)

## 2024-07-16 LAB — BASIC METABOLIC PANEL WITH GFR
Anion gap: 10 (ref 5–15)
BUN: 21 mg/dL — ABNORMAL HIGH (ref 6–20)
CO2: 23 mmol/L (ref 22–32)
Calcium: 9.2 mg/dL (ref 8.9–10.3)
Chloride: 103 mmol/L (ref 98–111)
Creatinine, Ser: 1.34 mg/dL — ABNORMAL HIGH (ref 0.61–1.24)
GFR, Estimated: >60 mL/min
Glucose, Bld: 101 mg/dL — ABNORMAL HIGH (ref 70–99)
Potassium: 4.8 mmol/L (ref 3.5–5.1)
Sodium: 136 mmol/L (ref 135–145)

## 2024-07-16 NOTE — TOC Progression Note (Signed)
 Transition of Care [facility]) - Progression Note    Patient Details  Name: Jesse Key MRN: [id number] Date of Birth: 05/16/1966  Transition of Care [facility]) CM/SW Contact  [name] Phone Number: [phone] 07/16/2024, 10:00 AM  Clinical Narrative:   CSW spoke with the [facility] team who stated that the patient started his disability application with the [facility], application is pending still. Patient is still on wait list for shelters.   Per chart review, patient is not medically ready for dc. ICM will continue to follow.   HF CSW/CM will continue to follow and monitor for dc readiness.     Expected Discharge Plan: Homeless Shelter Barriers to Discharge: Homeless with medical needs               Expected Discharge Plan and Services       Living arrangements for the past 2 months: Homeless                                       Social Drivers of Health (SDOH) Interventions SDOH Screenings   Food Insecurity: Food Insecurity Present (07/09/2024)  Housing: High Risk (07/09/2024)  Transportation Needs: Unmet Transportation Needs (07/09/2024)  Utilities: At Risk (07/09/2024)  Depression (PHQ2-9): Medium Risk (06/04/2024)  Financial Resource Strain: High Risk (04/12/2024)  Stress: Stress Concern Present (06/04/2024)  Tobacco Use: High Risk (07/08/2024)  Health Literacy: Adequate Health Literacy (01/03/2024)    Readmission Risk Interventions    06/24/2024    3:25 PM  Readmission Risk Prevention Plan  Transportation Screening Complete  Home Care Screening Complete  Medication Review (RN CM) Complete

## 2024-07-16 NOTE — Progress Notes (Signed)
 Chaplain responded to  consult for Advance Directives. Chaplain introduced spiritual care and offered support in the setting of inpatient admission. Mr Kloos shared that he is preparing for discharge, but anxious because he is presently experiencing homelessness and his physician wants him to have a better environment for discharge. He is anxiously awaiting news from various programs that have been contacted. Chaplain validated feelings and acknowledged the difficulty of homelessness in any circumstance, but particularly when recovering from illness.  Chaplain provided the AD education outlined below. Mr Nanna declined completing at this time, but voiced understanding of chaplain availability for further education, support completing, or assistance with notarization and also understands how to complete after discharge.  Chaplain responded to a consult request for Advance Directive education.   Chaplain provided the Advance Directive packet as well as education on Advance Directives-documents an individual completes to communicate their health care directions in advance of a time when they may need them. Chaplain informed pt the documents which may be completed here in the hospital are the Living Will and Health Care Power of [place].   Chaplain informed that the Health Care Power of [place] is a legal document in which an individual names another person, their Health Care Agent, to make health care decisions when the individual is not able to make them for themselves. The Health Care Agent's function can be temporary or permanent depending on the pt's ability to make and communicate those decisions independently. Chaplain informed pt in the absence of a Health Care Power of Attorney, the state of [place]  directs health care providers to look to the following individuals in the order listed: legal guardian; an attorney?in?fact under a general power of attorney (POA) if that POA includes the right  to make health care decisions; a husband or wife; a majority of parents and adult children; a majority of adult brothers and sisters; or an individual who has an established relationship with you, who is acting in good faith and who can convey your wishes.  If none of these person are available or willing to make medical decisions on a patient's behalf, the law allows the patient's doctor to make decisions for them as long as another doctor agrees with those decisions.  Chaplain also informed the patient that the Health Care agent has no decision-making authority over any affairs other than those related to his or her medical care.   The chaplain further educated the pt that a Living Will is a legal document that allows an individual to state his or her desire not to receive life-prolonging measures in the event that they have a condition that is incurable and will result in their death in a short period of time; they are unconscious, and doctors are confident that they will not regain consciousness; and/or they have advanced dementia or other substantial and irreversible loss of mental function. The chaplain informed pt that life-prolonging measures are medical treatments that would only serve to postpone death, including breathing machines, kidney dialysis, antibiotics, artificial nutrition and hydration (tube feeding), and similar forms of treatment and that if an individual is able to express their wishes, they may also make them known without the use of a Living Will, but in the event that an individual is not able to express their wishes themselves, a Living Will allows medical providers and the pt's family and friends ensure that they are not making decisions on the pt's behalf, but rather serving as the pt's voice to convey decisions the pt  has already made.   The patient is aware that the decision to create an advance directive is theirs alone and they may chose not to complete the documents or may  chose to complete one portion or both.  The patient was informed that they can revoke the documents at any time by striking through them and writing void or by completing new documents, but that it is also advisable that the individual verbally notify interested parties that their wishes have changed.  They are also aware that the document must be signed in the presence of a notary public and two witnesses and that this can be done while the patient is still admitted to the hospital or after discharge in the community. If they decide to complete Advance Directives after being discharged from the hospital, they have been advised to notify all interested parties and to provide those documents to their physicians and loved ones in addition to bringing them to the hospital in the event of another hospitalization.   The chaplain informed the pt that if they desire to proceed with completing Advance Directive Documentation while they are still admitted, notary services are typically available at [facility] between the hours of 1:00 and 3:30 Monday-Thursday.    When the patient is ready to have these documents completed, the patient should request that their nurse place a spiritual care consult and indicate that the patient is ready to have their advance directives notarized so that arrangements for witnesses and notary public can be made.  Please page spiritual care if the patient desires further education or has questions.     Alan HERO. Davee Lomax, M.Div. [facility] Chaplain Pager [phone] Office [phone]      07/16/24 1132  Spiritual Encounters  Type of Visit Initial  Care provided to: Patient  Referral source Nurse (RN/NT/LPN)  Reason for visit Advance directives  Advance Directives (For Healthcare)  Does Patient Have a Medical Advance Directive? No  Would patient like information on creating a medical advance directive? Yes (Inpatient - patient defers creating a medical advance  directive at this time - Information given)

## 2024-07-16 NOTE — Plan of Care (Signed)
" °  Problem: Education: Goal: Knowledge of General Education information will improve Description: Including pain rating scale, medication(s)/side effects and non-pharmacologic comfort measures Outcome: Progressing   Problem: Health Behavior/Discharge Planning: Goal: Ability to manage health-related needs will improve Outcome: Progressing   Problem: Clinical Measurements: Goal: Ability to maintain clinical measurements within normal limits will improve Outcome: Progressing Goal: Will remain free from infection Outcome: Progressing Goal: Diagnostic test results will improve Outcome: Progressing Goal: Respiratory complications will improve Outcome: Progressing Goal: Cardiovascular complication will be avoided Outcome: Progressing   Problem: Nutrition: Goal: Adequate nutrition will be maintained Outcome: Progressing   Problem: Coping: Goal: Level of anxiety will decrease Outcome: Progressing   Problem: Elimination: Goal: Will not experience complications related to bowel motility Outcome: Progressing Goal: Will not experience complications related to urinary retention Outcome: Progressing   Problem: Pain Managment: Goal: General experience of comfort will improve and/or be controlled Outcome: Progressing   Problem: Safety: Goal: Ability to remain free from injury will improve Outcome: Progressing   Problem: Skin Integrity: Goal: Risk for impaired skin integrity will decrease Outcome: Progressing   Problem: Education: Goal: Understanding of CV disease, CV risk reduction, and recovery process will improve Outcome: Progressing Goal: Individualized Educational Video(s) Outcome: Progressing   Problem: Activity: Goal: Ability to return to baseline activity level will improve Outcome: Progressing   Problem: Cardiovascular: Goal: Ability to achieve and maintain adequate cardiovascular perfusion will improve Outcome: Progressing Goal: Vascular access site(s) Level 0-1  will be maintained Outcome: Progressing   Problem: Health Behavior/Discharge Planning: Goal: Ability to safely manage health-related needs after discharge will improve Outcome: Progressing   Problem: Education: Goal: Understanding of CV disease, CV risk reduction, and recovery process will improve Outcome: Progressing Goal: Individualized Educational Video(s) Outcome: Progressing   Problem: Activity: Goal: Ability to return to baseline activity level will improve Outcome: Progressing   Problem: Cardiovascular: Goal: Ability to achieve and maintain adequate cardiovascular perfusion will improve Outcome: Progressing Goal: Vascular access site(s) Level 0-1 will be maintained Outcome: Progressing   "

## 2024-07-16 NOTE — Evaluation (Signed)
 Physical Therapy Evaluation Patient Details Name: TAICHI REPKA MRN: [id number] DOB: 1965/10/07 Today's Date: 07/16/2024  History of Present Illness  Patient is a 59 y/o male admitted 07/08/24 with chest pain + for NSTEMI s/p [facility] 07/11/24, and underwent PCI w/ DES to RCA s/p small dissection RCA second DES on 07/15/24.  PMH positive for tobacco, cocaine, ETOH use, chronic systolic heart failure, known CAD.  Clinical Impression  Patient presents with mobility close to functional baseline.  Able to ambulate hallway and negotiate stairs without difficulty or assistance needed.  Completed mobility education on frequent short to midrange walks daily and for self monitoring, slow rising and fall prevention.  No further skilled PT needs at this time.  PT will sign off.         If plan is discharge home, recommend the following:     Can travel by private vehicle        Equipment Recommendations None recommended by PT  Recommendations for Other Services       Functional Status Assessment Patient has not had a recent decline in their functional status     Precautions / Restrictions Precautions Precautions: None Recall of Precautions/Restrictions: Intact      Mobility  Bed Mobility Overal bed mobility: Independent                  Transfers Overall transfer level: Independent                      Ambulation/Gait Ambulation/Gait assistance: Independent Gait Distance (Feet): 225 Feet Assistive device: None            Stairs Stairs: Yes Stairs assistance: Independent Stair Management: No rails, Alternating pattern Number of Stairs: 4    Wheelchair Mobility     Tilt Bed    Modified Rankin (Stroke Patients Only)       Balance Overall balance assessment: Independent                                           Pertinent Vitals/Pain Pain Assessment Pain Assessment: No/denies pain    Home Living Family/patient expects to be  discharged to:: Shelter/Homeless Living Arrangements: Alone                      Prior Function Prior Level of Function : Independent/Modified Independent                     Extremity/Trunk Assessment   Upper Extremity Assessment Upper Extremity Assessment: Overall WFL for tasks assessed    Lower Extremity Assessment Lower Extremity Assessment: Overall WFL for tasks assessed    Cervical / Trunk Assessment Cervical / Trunk Assessment: Normal  Communication   Communication Communication: No apparent difficulties    Cognition Arousal: Alert Behavior During Therapy: WFL for tasks assessed/performed   PT - Cognitive impairments: No apparent impairments                         Following commands: Intact       Cueing       General Comments General comments (skin integrity, edema, etc.): VSS with mobility, BP 105/80, HR 91, SpO2 on RA 100%; educated in slow progression of activity, short but frequent walks daily (4-5x) and slow to rise in addition to other fall prevention education.  Exercises     Assessment/Plan    PT Assessment Patient does not need any further PT services  PT Problem List         PT Treatment Interventions      PT Goals (Current goals can be found in the Care Plan section)  Acute Rehab PT Goals PT Goal Formulation: All assessment and education complete, DC therapy    Frequency       Co-evaluation               AM-PAC PT 6 Clicks Mobility  Outcome Measure Help needed turning from your back to your side while in a flat bed without using bedrails?: None Help needed moving from lying on your back to sitting on the side of a flat bed without using bedrails?: None Help needed moving to and from a bed to a chair (including a wheelchair)?: None Help needed standing up from a chair using your arms (e.g., wheelchair or bedside chair)?: None Help needed to walk in hospital room?: None Help needed climbing 3-5  steps with a railing? : None 6 Click Score: 24    End of Session Equipment Utilized During Treatment: Gait belt Activity Tolerance: Patient tolerated treatment well Patient left: in bed   PT Visit Diagnosis: Difficulty in walking, not elsewhere classified (R26.2)    Time: 1100-1111 PT Time Calculation (min) (ACUTE ONLY): 11 min   Charges:   PT Evaluation $PT Eval Low Complexity: 1 Low   PT General Charges $$ ACUTE PT VISIT: 1 Visit         Micheline Portal, PT Acute Rehabilitation Services Office:[phone] 07/16/2024   Montie Portal 07/16/2024, 11:23 AM

## 2024-07-16 NOTE — Progress Notes (Addendum)
 Patient ID: Jesse Key, male   DOB: 05-06-1966, 59 y.o.   MRN: [id number]     Advanced Heart Failure Rounding Note  Cardiologist: Soyla DELENA Merck, MD  AHF Cardiologist: Dr. Cherrie  Chief Complaint: Low-output HF Patient Profile   Jesse Key is a 59 y.o. male with h/o  tobacco, cocaine and alcohol use, chronic systolic heart failure and known CAD (diffuse moderate, nonobstructive dz by cath 5/25), presented with significantly elevated troponin and active chest pain concerning for NSTEMI. UDS + for cocaine.   Significant events:   1/8: R/LHC: see imaging below. Elevated filling pressures, low CI 1.62. Started on milrinone  0.25.  1/12: s/p PCI with DES to RCA c/b small dissection of pRCA s/p second DES  Subjective:    Co-ox 70% on milrinone  0.125 mcg/kg/min. CVP 5. Net -1.4L, weight stable. sCr stable 1.3-1.4 Ambulating in hall, feels well. Dispo complicated by homelessness. Will have PT see to determine if SNF is an option. He is on multiple waitlists for housing options.  Objective:    Weight Range: 58.7 kg Body mass index is 20.89 kg/m.   Vital Signs:   Temp:  [98.1 F (36.7 C)-98.8 F (37.1 C)] 98.2 F (36.8 C) (01/13 0723) Pulse Rate:  [0-106] 88 (01/13 0723) Resp:  [16-25] 20 (01/13 0723) BP: (98-166)/(55-81) 98/67 (01/13 0723) SpO2:  [96 %-99 %] 97 % (01/13 0723) Weight:  [58.7 kg] 58.7 kg (01/13 0526) Last BM Date : 07/14/24  Weight change: Filed Weights   07/14/24 0448 07/15/24 0531 07/16/24 0526  Weight: 58.9 kg 59.1 kg 58.7 kg   Intake/Output:  Intake/Output Summary (Last 24 hours) at 07/16/2024 0931 Last data filed at 07/16/2024 0724 Gross per 24 hour  Intake 841 ml  Output 2075 ml  Net -1234 ml   Physical Exam   General: Well appearing. No distress  Cardiac: JVP ~ cm. No murmurs  Extremities: Warm and dry.  No peripheral edema.  Neuro: A&O x3. Affect pleasant.   Telemetry   SR 90s, LBBB (personally reviewed)  Labs   CBC Recent  Labs    07/15/24 0530 07/16/24 0532  WBC 8.7 8.5  HGB 13.2 13.0  HCT 42.1 41.7  MCV 75.6* 75.4*  PLT 218 240   Basic Metabolic Panel Recent Labs    98/87/73 0530 07/16/24 0532  NA 136 136  K 4.8 4.8  CL 103 103  CO2 23 23  GLUCOSE 133* 101*  BUN 20 21*  CREATININE 1.41* 1.34*  CALCIUM  9.0 9.2   Liver Function Tests No results for input(s): AST, ALT, ALKPHOS, BILITOT, PROT, ALBUMIN in the last 72 hours.  BNP (last 3 results) Recent Labs    04/01/24 1327 04/12/24 1014 05/21/24 1004  BNP >4,500.0* 3,217.5* 3,013.7*   ProBNP (last 3 results) Recent Labs    06/23/24 2241 06/30/24 1232 07/08/24 2224  PROBNP 11,603.0* 3,740.0* 8,698.0*   Fasting Lipid Panel No results for input(s): CHOL, HDL, LDLCALC, TRIG, CHOLHDL, LDLDIRECT in the last 72 hours.  Cardiac Imaging:    LHC 07/12/23: Dominance: Right   Medications:    Scheduled Medications:  aspirin  EC  81 mg Oral Daily   Chlorhexidine  Gluconate Cloth  6 each Topical Daily   clopidogrel   75 mg Oral Q breakfast   digoxin   0.0625 mg Oral Daily   empagliflozin   10 mg Oral QAC breakfast   enoxaparin  (LOVENOX ) injection  40 mg Subcutaneous Q24H   folic acid   1 mg Oral Daily   multivitamin with minerals  1 tablet Oral Daily   rosuvastatin   20 mg Oral Daily   sodium chloride  flush  10-40 mL Intracatheter Q12H   sodium chloride  flush  3 mL Intravenous Q12H   sodium chloride  flush  3 mL Intravenous Q12H   spironolactone   25 mg Oral Daily   thiamine   100 mg Oral Daily   torsemide   20 mg Oral Daily    Infusions:  sodium chloride       PRN Medications: sodium chloride , acetaminophen , levalbuterol , LORazepam , ondansetron  (ZOFRAN ) IV, sodium chloride  flush, sodium chloride  flush  Assessment/Plan   NSTEMI, CAD - CP on admit. Hs-trop 1,513>1,613>2,107. Cocaine +  - Nonobstructive CAD on 7/25 cath. Repeat this admission with 80% RCA (possible culprit). Possible that elevated troponins,  were troponin leak related to low output heart failure and cocaine use. - S/p PCI with DES to RCA c/b dissection of pRCA treated with second DES - continue ASA/plavix , DAPT 57yr - continue statin  Acute on chronic systolic CHF: Nonischemic cardiomyopathy. Echo in 5/25 showed EF < 20%, RV nl, global HK, mid-mod MR. R/LHC in 5/25 showed non-obstructive CAD, well-compensated filling pressures with CI 2.07.  CMR in 5/25 showed LV EF 18%, RV EF 35%, nonspecific RV insertion site LGE.  Echo 9/25: EF 15-20%, RV mildly reduced, RVSP 55 mmHg, severe MR, severe TR, dilated IVC.  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 Echo 12/25: EF <20%, LV with GHK, RV normal, LA mod dilated, mild MR. Significant RCA disease on cath, but this does not explain his long-standing cardiomyopathy. RHC 1/8 markedly elevated right and left heart filling pressures with low CI (1.5 F/1.6 T) and PAPi 1.2. Started on milrinone . Underwent staged PCI to RCA as above.  - NYHA II at admission. Hemodynamics on RHC much worse than would be expected with symptoms.  - Co-ox 70% on milrinone  0.125 mcg/kg/min. Stop today, repeat co-ox this afternoon. - Diuresed well, CVP 4-7. Start torsemide  20 mg daily. - Continue digoxin  0.0625 daily. Lvl 0.8 - Continue Jardiance  10 daily - Continue spironolactone  25 mg daily.  - BP too low to restart entresto  - No beta blocker with low output.  - Poor candidate for advanced therapies with social situation. Compliance had been improved with paramedicine, until lost job and stable housing. He is now unhoused. SW has been working to get him into housing, remains on waitlist.  - EF remains <35% with LBBB-like IVCD.  Ideally would get CRT-D, given low output heart failure and lack of candidacy at this time for VAD. Will reach out at discharge.    LBBB - QRS >150s on ECG and tele   CKD stage 3b: AKI, resolved. Baseline ~1.5-1.6 - cardiorenal syndrome.  - on Jardiance     Cocaine  use Tobacco use ETOH use - Recently drank ETOH again because he is stressed by social situation - UDS + cocaine and opiates - Smokes ~3 cigarettes a day - Cessation of all advised    SDOH:  - poor health literacy - Enrolled in paramedicine, compliance much improved - Has applied for disability, he is waiting for approval - On waitlist for multiple housing options - PR consult to eval candidacy for SNF   [name], NP 07/16/2024, 9:31 AM  Advanced Heart Failure Team Pager [phone] (M-F; 7a - 5p)    Please visit [email or web address]: For overnight coverage please call cardiology fellow first. If fellow not available call Shock/ECMO MD on call.  For ECMO / Mechanical Support (Impella, IABP, LVAD) issues  call Shock / ECMO MD on call.    Patient seen and examined with the above-signed Advanced Practice Provider and/or Housestaff. I personally reviewed laboratory data, imaging studies and relevant notes. I independently examined the patient and formulated the important aspects of the plan. I have edited the note to reflect any of my changes or salient points. I have personally discussed the plan with the patient and/or family.  He is s/p PCI of RCA yesterday.   Remains on milrinone  0.125. Co-ox and CVP ok   Denies CP or SOB  General:  Ambulating room. No resp difficulty HEENT: normal Neck: supple. no JVD.  Cor: Regular rate & rhythm. No rubs, gallops or murmurs. Lungs: clear Abdomen: soft, nontender, nondistended.Good bowel sounds. Extremities: no cyanosis, clubbing, rash, edema Neuro: alert & orientedx3, cranial nerves grossly intact. moves all 4 extremities w/o difficulty. Affect pleasant  Will plan to stop milrinone  today and follow co-ox. Start po torsemide .   Continue DAPT/statin  Have reached out to SW to help with d/c planning as he is homeless currently.   Toribio Fuel, MD  9:58 AM

## 2024-07-16 NOTE — Plan of Care (Signed)

## 2024-07-17 ENCOUNTER — Telehealth (INDEPENDENT_AMBULATORY_CARE_PROVIDER_SITE_OTHER): Payer: Self-pay | Admitting: Primary Care

## 2024-07-17 ENCOUNTER — Telehealth (HOSPITAL_COMMUNITY): Payer: Self-pay | Admitting: Licensed Clinical Social Worker

## 2024-07-17 DIAGNOSIS — I5023 Acute on chronic systolic (congestive) heart failure: Secondary | ICD-10-CM | POA: Diagnosis not present

## 2024-07-17 LAB — BASIC METABOLIC PANEL WITH GFR
Anion gap: 9 (ref 5–15)
BUN: 29 mg/dL — ABNORMAL HIGH (ref 6–20)
CO2: 26 mmol/L (ref 22–32)
Calcium: 9.5 mg/dL (ref 8.9–10.3)
Chloride: 100 mmol/L (ref 98–111)
Creatinine, Ser: 1.58 mg/dL — ABNORMAL HIGH (ref 0.61–1.24)
GFR, Estimated: 50 mL/min — ABNORMAL LOW
Glucose, Bld: 92 mg/dL (ref 70–99)
Potassium: 5 mmol/L (ref 3.5–5.1)
Sodium: 135 mmol/L (ref 135–145)

## 2024-07-17 LAB — COOXEMETRY PANEL
Carboxyhemoglobin: 1.4 % (ref 0.5–1.5)
Methemoglobin: <0.7 % (ref 0.0–1.5)
O2 Saturation: 55 %
Total hemoglobin: 14.2 g/dL (ref 12.0–16.0)

## 2024-07-17 MED ORDER — SPIRONOLACTONE 12.5 MG HALF TABLET
12.5000 mg | ORAL_TABLET | Freq: Every day | ORAL | Status: DC
  Administered 2024-07-17 – 2024-07-22 (×6): 12.5 mg via ORAL
  Filled 2024-07-17 (×6): qty 1

## 2024-07-17 MED ORDER — TORSEMIDE 20 MG PO TABS
40.0000 mg | ORAL_TABLET | Freq: Every day | ORAL | Status: DC
  Administered 2024-07-17: 40 mg via ORAL
  Filled 2024-07-17: qty 2

## 2024-07-17 NOTE — Plan of Care (Signed)
" °  Problem: Education: Goal: Knowledge of General Education information will improve Description: Including pain rating scale, medication(s)/side effects and non-pharmacologic comfort measures Outcome: Progressing   Problem: Clinical Measurements: Goal: Respiratory complications will improve Outcome: Progressing   Problem: Activity: Goal: Risk for activity intolerance will decrease Outcome: Progressing   Problem: Nutrition: Goal: Adequate nutrition will be maintained Outcome: Progressing   Problem: Elimination: Goal: Will not experience complications related to bowel motility Outcome: Progressing Goal: Will not experience complications related to urinary retention Outcome: Progressing   Problem: Pain Managment: Goal: General experience of comfort will improve and/or be controlled Outcome: Progressing   Problem: Safety: Goal: Ability to remain free from injury will improve Outcome: Progressing   Problem: Skin Integrity: Goal: Risk for impaired skin integrity will decrease Outcome: Progressing   Problem: Cardiovascular: Goal: Ability to achieve and maintain adequate cardiovascular perfusion will improve Outcome: Progressing Goal: Vascular access site(s) Level 0-1 will be maintained Outcome: Progressing   "

## 2024-07-17 NOTE — Telephone Encounter (Signed)
 H&V Care Navigation CSW Progress Note  Clinical Social Worker spoke with pt regarding disability- informed him again to call [employer] to hear what determination was.  Pt called and was informed he was denied for disability.  Patient had applied with help from [facility] so CSW emailed caseworker, Leonor Endo, to inquire about next steps and offer our assistance in writing letter explaining patients medical condition.  Patient remains homeless at this time.  CSW provided number to coordinated entry line and encouraged to call and get on list for possible case management.  Patient already on waitlist for shelter but no movement there per patient.  CSW will continue to follow through clinic and assist as needed  Eurika Sandy H. Brodie Correll, LCSW Clinical Social Worker Advanced Heart Failure Clinic Desk#: [phone] Cell#: [phone]

## 2024-07-17 NOTE — Telephone Encounter (Signed)
 Spoke to pt about upcoming appt.. Will be present

## 2024-07-17 NOTE — Plan of Care (Signed)
  Problem: Education: Goal: Knowledge of General Education information will improve Description: Including pain rating scale, medication(s)/side effects and non-pharmacologic comfort measures Outcome: Progressing   Problem: Health Behavior/Discharge Planning: Goal: Ability to manage health-related needs will improve Outcome: Progressing   Problem: Clinical Measurements: Goal: Ability to maintain clinical measurements within normal limits will improve Outcome: Progressing Goal: Will remain free from infection Outcome: Progressing Goal: Diagnostic test results will improve Outcome: Progressing Goal: Respiratory complications will improve Outcome: Progressing Goal: Cardiovascular complication will be avoided Outcome: Progressing   Problem: Nutrition: Goal: Adequate nutrition will be maintained Outcome: Progressing   Problem: Coping: Goal: Level of anxiety will decrease Outcome: Progressing   Problem: Elimination: Goal: Will not experience complications related to bowel motility Outcome: Progressing Goal: Will not experience complications related to urinary retention Outcome: Progressing   Problem: Safety: Goal: Ability to remain free from injury will improve Outcome: Progressing   

## 2024-07-17 NOTE — [place] FL2 (Signed)
 " [place]  MEDICAID FL2 LEVEL OF CARE FORM     IDENTIFICATION  Patient Name: Jesse Key Birthdate: 09-29-1965 Sex: male Admission Date (Current Location): 07/08/2024  [employer] and [place] Number:  Producer, Television/film/video and Address:  [facility], [address], [place], [place] [address]      Provider Number: [id number]  Attending Physician Name and Address:  Cherrie Toribio SAUNDERS, MD  Relative Name and Phone Number:       Current Level of Care: Hospital Recommended Level of Care: Skilled Nursing Facility Prior Approval Number:    Date Approved/Denied:   PASRR Number: [id number]  Discharge Plan: SNF    Current Diagnoses: Patient Active Problem List   Diagnosis Date Noted   Elevated liver transaminase level 07/09/2024   Bleeding internal hemorrhoids 07/02/2024   Rectal bleeding 06/30/2024   Asthma, chronic 06/26/2024   AKI (acute kidney injury) 06/25/2024   Lactic acidosis 06/25/2024   CHF (congestive heart failure) (HCC) 06/24/2024   Nonischemic cardiomyopathy (HCC) 06/24/2024   Demand ischemia (HCC) 06/24/2024   Acute heart failure with reduced ejection fraction (HFrEF) (HCC) 06/24/2024   Acute gout 04/04/2024   Chest pain 04/02/2024   Chronic kidney disease, stage 3a (HCC) 04/02/2024   Hyperkalemia 04/02/2024   CAD (coronary artery disease) 04/02/2024   Acute on chronic systolic CHF (congestive heart failure) (HCC) 04/02/2024   Shortness of breath 11/25/2023   Essential hypertension 11/24/2023   Heart failure (HCC) 11/23/2023   ETOH abuse 11/23/2023   Elevated troponin 11/23/2023   Leukocytosis 11/23/2023   Acute pulmonary edema (HCC) 11/23/2023    Orientation RESPIRATION BLADDER Height & Weight     Self, Time, Situation, Place  Normal Continent Weight: 128 lb 8 oz (58.3 kg) Height:  5' 6 (167.6 cm)  BEHAVIORAL SYMPTOMS/MOOD NEUROLOGICAL BOWEL NUTRITION STATUS      Continent Diet (See dc summary)  AMBULATORY STATUS  COMMUNICATION OF NEEDS Skin   Limited Assist Verbally Normal                       Personal Care Assistance Level of Assistance  Bathing, Feeding, Dressing Bathing Assistance: Limited assistance Feeding assistance: Limited assistance Dressing Assistance: Limited assistance     Functional Limitations Info  Sight, Hearing, Speech Sight Info: Adequate Hearing Info: Adequate Speech Info: Adequate    SPECIAL CARE FACTORS FREQUENCY  PT (By licensed PT), OT (By licensed OT)     PT Frequency: 5x weekly OT Frequency: 5x weekly            Contractures      Additional Factors Info  Code Status, Allergies Code Status Info: Full Allergies Info: Shrimp (Shellfish Allergy);  Shellfish Allergy           Current Medications (07/17/2024):  This is the current hospital active medication list Current Facility-Administered Medications  Medication Dose Route Frequency Provider Last Rate Last Admin   acetaminophen  (TYLENOL ) tablet 650 mg  650 mg Oral Q4H PRN Thukkani, Arun K, MD       aspirin  EC tablet 81 mg  81 mg Oral Daily Marcine Catalan M, PA-C   81 mg at 07/16/24 0844   Chlorhexidine  Gluconate Cloth 2 % PADS 6 each  6 each Topical Daily McLean, Dalton S, MD   6 each at 07/16/24 0844   clopidogrel  (PLAVIX ) tablet 75 mg  75 mg Oral Q breakfast Thukkani, Arun K, MD   75 mg at 07/16/24 9155   digoxin  (  LANOXIN ) tablet 0.0625 mg  0.0625 mg Oral Daily Rathore, Vasundhra, MD   0.0625 mg at 07/16/24 0844   empagliflozin  (JARDIANCE ) tablet 10 mg  10 mg Oral QAC breakfast Alfornia Madison, MD   10 mg at 07/16/24 0844   enoxaparin  (LOVENOX ) injection 40 mg  40 mg Subcutaneous Q24H McLean, Dalton S, MD   40 mg at 07/16/24 9155   folic acid  (FOLVITE ) tablet 1 mg  1 mg Oral Daily Rathore, Vasundhra, MD   1 mg at 07/16/24 9155   levalbuterol  (XOPENEX ) nebulizer solution 0.63 mg  0.63 mg Nebulization Q6H PRN Rathore, Vasundhra, MD       LORazepam  (ATIVAN ) tablet 1 mg  1 mg Oral Q4H PRN  Arrien, Mauricio Daniel, MD       multivitamin with minerals tablet 1 tablet  1 tablet Oral Daily Rathore, Vasundhra, MD   1 tablet at 07/16/24 0844   ondansetron  (ZOFRAN ) injection 4 mg  4 mg Intravenous Q6H PRN McLean, Dalton S, MD       rosuvastatin  (CRESTOR ) tablet 20 mg  20 mg Oral Daily Rathore, Vasundhra, MD   20 mg at 07/16/24 0844   sodium chloride  flush (NS) 0.9 % injection 10-40 mL  10-40 mL Intracatheter Q12H Rolan Ezra RAMAN, MD   10 mL at 07/16/24 2149   sodium chloride  flush (NS) 0.9 % injection 3 mL  3 mL Intravenous Q12H Rolan Ezra RAMAN, MD   3 mL at 07/16/24 2149   sodium chloride  flush (NS) 0.9 % injection 3 mL  3 mL Intravenous PRN Rolan Ezra RAMAN, MD       sodium chloride  flush (NS) 0.9 % injection 3 mL  3 mL Intravenous Q12H Thukkani, Arun K, MD   3 mL at 07/16/24 2149   sodium chloride  flush (NS) 0.9 % injection 3 mL  3 mL Intravenous PRN Thukkani, Arun K, MD       spironolactone  (ALDACTONE ) tablet 12.5 mg  12.5 mg Oral Daily Lee, Jordan, NP       thiamine  (VITAMIN B1) tablet 100 mg  100 mg Oral Daily Rathore, Vasundhra, MD   100 mg at 07/16/24 0844   torsemide  (DEMADEX ) tablet 20 mg  20 mg Oral Daily Lee, Jordan, NP   20 mg at 07/16/24 9155     Discharge Medications: Please see discharge summary for a list of discharge medications.  Relevant Imaging Results:  Relevant Lab Results:   Additional Information SSN: [id number]  Arlana JINNY Moats, LCSWA     "

## 2024-07-17 NOTE — TOC Progression Note (Signed)
 Transition of Care [facility]) - Progression Note    Patient Details  Name: Jesse Key MRN: [id number] Date of Birth: 03/14/1966  [facility] CM/SW Contact  [name] Phone Number: [phone] 07/17/2024, 9:44 AM  Clinical Narrative:   9:25 AM- HF CSW called and spoke with Ashley at [facility] who stated that they are currently taking Medicaid beds. However, will need to review the patient once faxed over in the hub. CSW completed patients PASRR and FL2. Faxed the patient out to SNF facilities that are accepting medicaid beds at this time. Status is pending at this time. Ashley stated that she will follow up with CSW after she reviews the patient.   HF CSW/CM will continue to follow and monitor for dc readiness.     Expected Discharge Plan: Homeless Shelter Barriers to Discharge: Homeless with medical needs               Expected Discharge Plan and Services       Living arrangements for the past 2 months: Homeless                                       Social Drivers of Health (SDOH) Interventions SDOH Screenings   Food Insecurity: Food Insecurity Present (07/09/2024)  Housing: High Risk (07/09/2024)  Transportation Needs: Unmet Transportation Needs (07/09/2024)  Utilities: At Risk (07/09/2024)  Depression (PHQ2-9): Medium Risk (06/04/2024)  Financial Resource Strain: High Risk (04/12/2024)  Stress: Stress Concern Present (06/04/2024)  Tobacco Use: High Risk (07/08/2024)  Health Literacy: Adequate Health Literacy (01/03/2024)    Readmission Risk Interventions    06/24/2024    3:25 PM  Readmission Risk Prevention Plan  Transportation Screening Complete  Home Care Screening Complete  Medication Review (RN CM) Complete

## 2024-07-17 NOTE — Progress Notes (Addendum)
 Patient ID: ENRIGUE HASHIMI, male   DOB: [birth year], 59 y.o.   MRN: [id number]     Advanced Heart Failure Rounding Note  Cardiologist: Soyla DELENA Merck, MD  [facility] Cardiologist: Dr. Cherrie  Chief Complaint: Low-output HF Patient Profile   ORA MCNATT is a 59 y.o. male with h/o  tobacco, cocaine and alcohol use, chronic systolic heart failure and known CAD (diffuse moderate, nonobstructive dz by cath 5/25), presented with significantly elevated troponin and active chest pain concerning for NSTEMI. UDS + for cocaine.   Significant events:   1/8: R/LHC: see imaging below. Elevated filling pressures, low CI 1.62. Started on milrinone  0.25.  1/12: s/p PCI with DES to RCA c/b small dissection of pRCA s/p second DES  Subjective:    Co-ox 55% off milrinone . Fick CO/CI 2.6/1.6. sCr 1.34>1.58  Feeling okay this morning. Ambulating around room. No shortness of breath.   Objective:    Weight Range: 58.3 kg Body mass index is 20.74 kg/m.   Vital Signs:   Temp:  [97.6 F (36.4 C)-98.8 F (37.1 C)] 97.7 F (36.5 C) (01/14 0719) Pulse Rate:  [82-93] 86 (01/14 0719) Resp:  [15-20] 20 (01/14 0719) BP: (99-108)/(56-80) 104/71 (01/14 0719) SpO2:  [100 %] 100 % (01/14 0719) Weight:  [58.3 kg] 58.3 kg (01/14 0546) Last BM Date : 07/14/24  Weight change: Filed Weights   07/15/24 0531 07/16/24 0526 07/17/24 0546  Weight: 59.1 kg 58.7 kg 58.3 kg   Intake/Output:  Intake/Output Summary (Last 24 hours) at 07/17/2024 0850 Last data filed at 07/17/2024 0720 Gross per 24 hour  Intake 1066 ml  Output 175 ml  Net 891 ml   Physical Exam   General: Well appearing. No distress  Cardiac: JVP flat. No murmurs  Abdomen: Taut, distended.  Extremities: Warm and dry.  No peripheral edema.  Neuro: A&O x3. Affect pleasant.   Telemetry   SR 80s (personally reviewed)  Labs   CBC Recent Labs    07/15/24 0530 07/16/24 0532  WBC 8.7 8.5  HGB 13.2 13.0  HCT 42.1 41.7  MCV 75.6* 75.4*   PLT 218 240   Basic Metabolic Panel Recent Labs    98/86/73 0532 07/17/24 0542  NA 136 135  K 4.8 5.0  CL 103 100  CO2 23 26  GLUCOSE 101* 92  BUN 21* 29*  CREATININE 1.34* 1.58*  CALCIUM  9.2 9.5   Liver Function Tests No results for input(s): AST, ALT, ALKPHOS, BILITOT, PROT, ALBUMIN in the last 72 hours.  BNP (last 3 results) Recent Labs    04/01/24 1327 04/12/24 1014 05/21/24 1004  BNP >4,500.0* 3,217.5* 3,013.7*   ProBNP (last 3 results) Recent Labs    06/23/24 2241 06/30/24 1232 07/08/24 2224  PROBNP 11,603.0* 3,740.0* 8,698.0*   Fasting Lipid Panel No results for input(s): CHOL, HDL, LDLCALC, TRIG, CHOLHDL, LDLDIRECT in the last 72 hours.  Cardiac Imaging:    LHC 07/12/23: Dominance: Right   Medications:    Scheduled Medications:  aspirin  EC  81 mg Oral Daily   Chlorhexidine  Gluconate Cloth  6 each Topical Daily   clopidogrel   75 mg Oral Q breakfast   digoxin   0.0625 mg Oral Daily   empagliflozin   10 mg Oral QAC breakfast   enoxaparin  (LOVENOX ) injection  40 mg Subcutaneous Q24H   folic acid   1 mg Oral Daily   multivitamin with minerals  1 tablet Oral Daily   rosuvastatin   20 mg Oral Daily   sodium chloride  flush  10-40  mL Intracatheter Q12H   sodium chloride  flush  3 mL Intravenous Q12H   sodium chloride  flush  3 mL Intravenous Q12H   spironolactone   25 mg Oral Daily   thiamine   100 mg Oral Daily   torsemide   20 mg Oral Daily    Infusions:    PRN Medications: acetaminophen , levalbuterol , LORazepam , ondansetron  (ZOFRAN ) IV, sodium chloride  flush, sodium chloride  flush  Assessment/Plan   NSTEMI, CAD - CP on admit. Hs-trop 1,513>1,613>2,107. Cocaine +  - Nonobstructive CAD on 7/25 cath. Repeat this admission with 80% RCA (possible culprit). Possible that elevated troponins, were troponin leak related to low output heart failure and cocaine use. - S/p PCI with DES to RCA c/b dissection of pRCA treated with second  DES - continue ASA/plavix , DAPT 60yr - continue statin  Acute on chronic systolic CHF: Nonischemic cardiomyopathy. Echo in 5/25 showed EF < 20%, RV nl, global HK, mid-mod MR. R/LHC in 5/25 showed non-obstructive CAD, well-compensated filling pressures with CI 2.07.  CMR in 5/25 showed LV EF 18%, RV EF 35%, nonspecific RV insertion site LGE.  Echo 9/25: EF 15-20%, RV mildly reduced, RVSP 55 mmHg, severe MR, severe TR, dilated IVC.  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 Echo 12/25: EF <20%, LV with GHK, RV normal, LA mod dilated, mild MR. Significant RCA disease on cath, but this does not explain his long-standing cardiomyopathy. RHC 1/8 markedly elevated right and left heart filling pressures with low CI (1.5 F/1.6 T) and PAPi 1.2. Started on milrinone . Underwent staged PCI to RCA as above.  - NYHA II at admission. Hemodynamics on RHC much worse than would be expected with symptoms.  - Co-ox 55% and Fick CO/CI 2.6/1.6. Off milrinone . Low output again off inotropes, will discuss with Dr. Celica Kotowski, unfortunately options are limited  - Appears milldly volume up on exam, abd distended. Increase torsemide  to 40 mg daily. - Continue digoxin  0.0625 mg daily. Lvl 0.8 - Continue Jardiance  10 mg daily - Continue spironolactone  25 mg daily.  - BP too low to restart entresto  - No beta blocker with low output.  - Poor candidate for advanced therapies with social situation. Compliance had been improved with paramedicine, until lost job and stable housing. He is now unhoused. SW has been working to get him into housing, remains on waitlist.  - EF remains <35% with LBBB-like IVCD.  Ideally would get CRT-D, given low output heart failure and lack of candidacy at this time for VAD. Will reach out at discharge.   LBBB - QRS >150s on ECG and tele - plan as above   CKD stage 3b: AKI, resolved. Baseline ~1.5-1.6 - cardiorenal syndrome.  - on Jardiance   - Cr bumped off milrinone  1.3>1.5    Cocaine use Tobacco use ETOH use - Recently drank ETOH again because he is stressed by social situation - UDS + cocaine and opiates - Smokes ~3 cigarettes a day - Cessation of all advised    SDOH:  - poor health literacy - Enrolled in paramedicine, compliance much improved - Has applied for disability, he is waiting for approval - On waitlist for multiple housing options - PR consult to eval candidacy for SNF   [name], NP 07/17/2024, 8:50 AM  Advanced Heart Failure Team Pager [phone] (M-F; 7a - 5p)    Please visit [email or web address]: For overnight coverage please call cardiology fellow first. If fellow not available call Shock/ECMO MD on call.  For ECMO / Mechanical Support (Impella, IABP, LVAD)  issues call Shock / ECMO MD on call.    Patient seen and examined with the above-signed Advanced Practice Provider and/or Housestaff. I personally reviewed laboratory data, imaging studies and relevant notes. I independently examined the patient and formulated the important aspects of the plan. I have edited the note to reflect any of my changes or salient points. I have personally discussed the plan with the patient and/or family.  Off milrinone . Co-ox marginal. Feels ok. No CP or SOB.   CVP 7.   General:  Ambulating room. No resp difficulty HEENT: normal Neck: supple. JVP 7 Cor: Regular rate & rhythm. No rubs, gallops or murmurs. Lungs: clear Abdomen: soft, nontender, nondistended.Good bowel sounds. Extremities: no cyanosis, clubbing, rash, tr edema Neuro: alert & orientedx3, cranial nerves grossly intact. moves all 4 extremities w/o difficulty. Affect pleasant  Remains tenuous off milrinone . Will continue to follow co-ox. Given social situation not candidate for home inotropes or advanced therapies.   Working on housing situation with SW.   Continue DAPT for RCA stents.   Toribio Fuel, MD  11:52 AM

## 2024-07-18 ENCOUNTER — Other Ambulatory Visit (HOSPITAL_COMMUNITY): Payer: Self-pay

## 2024-07-18 ENCOUNTER — Inpatient Hospital Stay (INDEPENDENT_AMBULATORY_CARE_PROVIDER_SITE_OTHER): Payer: Self-pay | Admitting: Primary Care

## 2024-07-18 DIAGNOSIS — I5023 Acute on chronic systolic (congestive) heart failure: Secondary | ICD-10-CM | POA: Diagnosis not present

## 2024-07-18 LAB — BASIC METABOLIC PANEL WITH GFR
Anion gap: 11 (ref 5–15)
BUN: 39 mg/dL — ABNORMAL HIGH (ref 6–20)
CO2: 27 mmol/L (ref 22–32)
Calcium: 9.5 mg/dL (ref 8.9–10.3)
Chloride: 98 mmol/L (ref 98–111)
Creatinine, Ser: 1.75 mg/dL — ABNORMAL HIGH (ref 0.61–1.24)
GFR, Estimated: 44 mL/min — ABNORMAL LOW
Glucose, Bld: 99 mg/dL (ref 70–99)
Potassium: 4.6 mmol/L (ref 3.5–5.1)
Sodium: 136 mmol/L (ref 135–145)

## 2024-07-18 LAB — CBC
HCT: 43.8 % (ref 39.0–52.0)
Hemoglobin: 13.9 g/dL (ref 13.0–17.0)
MCH: 23.8 pg — ABNORMAL LOW (ref 26.0–34.0)
MCHC: 31.7 g/dL (ref 30.0–36.0)
MCV: 75.1 fL — ABNORMAL LOW (ref 80.0–100.0)
Platelets: 250 K/uL (ref 150–400)
RBC: 5.83 MIL/uL — ABNORMAL HIGH (ref 4.22–5.81)
RDW: 14.9 % (ref 11.5–15.5)
WBC: 8.7 K/uL (ref 4.0–10.5)
nRBC: 0 % (ref 0.0–0.2)

## 2024-07-18 LAB — COOXEMETRY PANEL
Carboxyhemoglobin: 1.6 % — ABNORMAL HIGH (ref 0.5–1.5)
Methemoglobin: 0.7 % (ref 0.0–1.5)
O2 Saturation: 62.2 %
Total hemoglobin: 14.2 g/dL (ref 12.0–16.0)

## 2024-07-18 MED ORDER — TORSEMIDE 20 MG PO TABS
20.0000 mg | ORAL_TABLET | Freq: Every day | ORAL | Status: DC
  Administered 2024-07-18: 20 mg via ORAL
  Filled 2024-07-18: qty 1

## 2024-07-18 NOTE — Progress Notes (Addendum)
 Patient ID: Jesse Key, male   DOB: [birth year], 59 y.o.   MRN: [id number]     Advanced Heart Failure Rounding Note  Cardiologist: Soyla DELENA Merck, MD  [facility] Cardiologist: Dr. Cherrie  Chief Complaint: Low-output HF Patient Profile   Jesse Key is a 59 y.o. male with h/o  tobacco, cocaine and alcohol use, chronic systolic heart failure and known CAD (diffuse moderate, nonobstructive dz by cath 5/25), presented with significantly elevated troponin and active chest pain concerning for NSTEMI. UDS + for cocaine.   Significant events:   1/8: R/LHC: see imaging below. Elevated filling pressures, low CI 1.62. Started on milrinone  0.25.  1/12: s/p PCI with DES to RCA c/b small dissection of pRCA s/p second DES  Subjective:    Off Milrinone , co-ox ok at 62%.   sCr 1.34>1.58>>1.75  (already got dose of torsemide )   Feels well this morning, breathing improved. No chest pain.  Objective:    Weight Range: 58.2 kg Body mass index is 20.72 kg/m.   Vital Signs:   Temp:  [97.4 F (36.3 C)-98.4 F (36.9 C)] 97.8 F (36.6 C) (01/15 1109) Pulse Rate:  [81-88] 84 (01/15 0932) Resp:  [16-20] 18 (01/15 1109) BP: (99-107)/(65-75) 104/67 (01/15 1109) SpO2:  [96 %-100 %] 97 % (01/15 1109) Weight:  [58.2 kg] 58.2 kg (01/15 0323) Last BM Date : 07/17/24  Weight change: Filed Weights   07/16/24 0526 07/17/24 0546 07/18/24 0323  Weight: 58.7 kg 58.3 kg 58.2 kg   Intake/Output:  Intake/Output Summary (Last 24 hours) at 07/18/2024 1231 Last data filed at 07/18/2024 0900 Gross per 24 hour  Intake 836 ml  Output 500 ml  Net 336 ml   Physical Exam    GENERAL: NAD Lungs- clear  CARDIAC:  JVP not elevated          Normal rate with regular rhythm. No murmur.  No LEE  ABDOMEN: Soft, non-tender, non-distended.  EXTREMITIES: Warm and well perfused.  NEUROLOGIC: No obvious FND   Telemetry   SR 80s (personally reviewed)  Labs   CBC Recent Labs    07/16/24 0532  07/18/24 0435  WBC 8.5 8.7  HGB 13.0 13.9  HCT 41.7 43.8  MCV 75.4* 75.1*  PLT 240 250   Basic Metabolic Panel Recent Labs    98/85/73 0542 07/18/24 0435  NA 135 136  K 5.0 4.6  CL 100 98  CO2 26 27  GLUCOSE 92 99  BUN 29* 39*  CREATININE 1.58* 1.75*  CALCIUM  9.5 9.5   Liver Function Tests No results for input(s): AST, ALT, ALKPHOS, BILITOT, PROT, ALBUMIN in the last 72 hours.  BNP (last 3 results) Recent Labs    04/01/24 1327 04/12/24 1014 05/21/24 1004  BNP >4,500.0* 3,217.5* 3,013.7*   ProBNP (last 3 results) Recent Labs    06/23/24 2241 06/30/24 1232 07/08/24 2224  PROBNP 11,603.0* 3,740.0* 8,698.0*   Fasting Lipid Panel No results for input(s): CHOL, HDL, LDLCALC, TRIG, CHOLHDL, LDLDIRECT in the last 72 hours.  Cardiac Imaging:    LHC 07/12/23: Dominance: Right   Medications:    Scheduled Medications:  aspirin  EC  81 mg Oral Daily   Chlorhexidine  Gluconate Cloth  6 each Topical Daily   clopidogrel   75 mg Oral Q breakfast   digoxin   0.0625 mg Oral Daily   empagliflozin   10 mg Oral QAC breakfast   enoxaparin  (LOVENOX ) injection  40 mg Subcutaneous Q24H   folic acid   1 mg Oral Daily   multivitamin  with minerals  1 tablet Oral Daily   rosuvastatin   20 mg Oral Daily   sodium chloride  flush  10-40 mL Intracatheter Q12H   sodium chloride  flush  3 mL Intravenous Q12H   sodium chloride  flush  3 mL Intravenous Q12H   spironolactone   12.5 mg Oral Daily   thiamine   100 mg Oral Daily   torsemide   20 mg Oral Daily    Infusions:    PRN Medications: acetaminophen , levalbuterol , LORazepam , ondansetron  (ZOFRAN ) IV, sodium chloride  flush, sodium chloride  flush  Assessment/Plan   NSTEMI, CAD - CP on admit. Hs-trop 1,513>1,613>2,107. Cocaine +  - Nonobstructive CAD on 7/25 cath. Repeat this admission with 80% RCA (possible culprit). Possible that elevated troponins, were troponin leak related to low output heart failure and  cocaine use. - S/p PCI with DES to RCA c/b dissection of pRCA treated with second DES - stable w/o CP  - continue ASA/plavix , DAPT 62yr - continue statin  Acute on chronic systolic CHF: Nonischemic cardiomyopathy. Echo in 5/25 showed EF < 20%, RV nl, global HK, mid-mod MR. R/LHC in 5/25 showed non-obstructive CAD, well-compensated filling pressures with CI 2.07.  CMR in 5/25 showed LV EF 18%, RV EF 35%, nonspecific RV insertion site LGE.  Echo 9/25: EF 15-20%, RV mildly reduced, RVSP 55 mmHg, severe MR, severe TR, dilated IVC.  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 Echo 12/25: EF <20%, LV with GHK, RV normal, LA mod dilated, mild MR. Significant RCA disease on cath, but this does not explain his long-standing cardiomyopathy. RHC 1/8 markedly elevated right and left heart filling pressures with low CI (1.5 F/1.6 T) and PAPi 1.2. Started on milrinone . Underwent staged PCI to RCA as above.  - NYHA II at admission. Hemodynamics on RHC much worse than would be expected with symptoms.  - Co-ox 62% off milrinone .  - on torsemide  40 mg daily. May need reduction to 20 mg d/t AKI, follow BMP  - Continue digoxin  0.0625 mg daily. Lvl 0.8 - Continue Jardiance  10 mg daily - Continue spironolactone  25 mg daily.  - BP too low to restart entresto  - No beta blocker with low output.  - Poor candidate for advanced therapies with social situation. Compliance had been improved with paramedicine, until lost job and stable housing. He is now unhoused. SW has been working to get him into housing - EF remains <35% with LBBB-like IVCD. Ideally would get CRT-D, given low output heart failure and lack of candidacy at this time for VAD. Will reach out at discharge.   LBBB - QRS >150s on ECG and tele - plan as above   CKD stage 3b: AKI, resolved. Baseline ~1.5-1.6 - cardiorenal syndrome.  - on Jardiance   - Cr bumped off milrinone  1.3>1.5>>1.8. co-ox ok. Reduce torsemide  does    Cocaine  use Tobacco use ETOH use - Recently drank ETOH again because he is stressed by social situation - UDS + cocaine and opiates - Smokes ~3 cigarettes a day - Cessation of all advised    SDOH:  - poor health literacy - Enrolled in paramedicine, compliance much improved - Has applied for disability, he is waiting for approval - Plan to d/c to homeless shelter once medically stable     Caffie Shed, PA-C 07/18/24, 12:31 PM  Advanced Heart Failure Team Pager [phone] (M-F; 7a - 5p)    Please visit [email or web address]: For overnight coverage please call cardiology fellow first. If fellow not available call Shock/ECMO MD on call.  For ECMO / Mechanical Support (Impella, IABP, LVAD) issues call Shock / ECMO MD on call.    Patient seen and examined with the above-signed Advanced Practice Provider and/or Housestaff. I personally reviewed laboratory data, imaging studies and relevant notes. I independently examined the patient and formulated the important aspects of the plan. I have edited the note to reflect any of my changes or salient points. I have personally discussed the plan with the patient and/or family.  Feels ok. No CP or SOB. Co-ox and CVP ok off milrinone   General:  Ambulating room. No resp difficulty HEENT: normal Neck: supple. no JVD.  Cor: Regular rate & rhythm. No rubs, gallops or murmurs. Lungs: clear Abdomen: soft, nontender, nondistended.Good bowel sounds. Extremities: no cyanosis, clubbing, rash, edema Neuro: alert & orientedx3, cranial nerves grossly intact. moves all 4 extremities w/o difficulty. Affect pleasant  He is much improved from a heart failure standpoint.  No further angina.  Coox and CVP are stable off inotropes.  He has received notification that he has been approved for a bed in a homeless shelter starting Monday morning we will plan for discharge on Sunday evening.  Jesse Fuel, MD  2:30 PM

## 2024-07-18 NOTE — TOC Progression Note (Signed)
 Transition of Care [facility]) - Progression Note    Patient Details  Name: Jesse Key MRN: [id number] Date of Birth: May 19, 1966  Transition of [facility] CM/SW Contact  [name] Phone Number: [phone] 07/18/2024, 9:12 AM  Clinical Narrative:   [facility] declined due to patients past SA history.   [facility] CSW/CM will continue to follow and monitor for dc readiness.     Expected Discharge Plan: Homeless Shelter Barriers to Discharge: Homeless with medical needs               Expected Discharge Plan and Services       Living arrangements for the past 2 months: Homeless                                       Social Drivers of Health (SDOH) Interventions SDOH Screenings   Food Insecurity: Food Insecurity Present (07/09/2024)  Housing: High Risk (07/09/2024)  Transportation Needs: Unmet Transportation Needs (07/09/2024)  Utilities: At Risk (07/09/2024)  Depression (PHQ2-9): Medium Risk (06/04/2024)  Financial Resource Strain: High Risk (04/12/2024)  Stress: Stress Concern Present (06/04/2024)  Tobacco Use: High Risk (07/08/2024)  Health Literacy: Adequate Health Literacy (01/03/2024)    Readmission Risk Interventions    06/24/2024    3:25 PM  Readmission Risk Prevention Plan  Transportation Screening Complete  Home Care Screening Complete  Medication Review (RN CM) Complete

## 2024-07-19 ENCOUNTER — Telehealth: Payer: Self-pay

## 2024-07-19 DIAGNOSIS — I5023 Acute on chronic systolic (congestive) heart failure: Secondary | ICD-10-CM | POA: Diagnosis not present

## 2024-07-19 LAB — BASIC METABOLIC PANEL WITH GFR
Anion gap: 10 (ref 5–15)
BUN: 44 mg/dL — ABNORMAL HIGH (ref 6–20)
CO2: 27 mmol/L (ref 22–32)
Calcium: 9.6 mg/dL (ref 8.9–10.3)
Chloride: 96 mmol/L — ABNORMAL LOW (ref 98–111)
Creatinine, Ser: 1.74 mg/dL — ABNORMAL HIGH (ref 0.61–1.24)
GFR, Estimated: 44 mL/min — ABNORMAL LOW
Glucose, Bld: 127 mg/dL — ABNORMAL HIGH (ref 70–99)
Potassium: 4.6 mmol/L (ref 3.5–5.1)
Sodium: 133 mmol/L — ABNORMAL LOW (ref 135–145)

## 2024-07-19 LAB — CBC
HCT: 42.5 % (ref 39.0–52.0)
Hemoglobin: 13.6 g/dL (ref 13.0–17.0)
MCH: 23.9 pg — ABNORMAL LOW (ref 26.0–34.0)
MCHC: 32 g/dL (ref 30.0–36.0)
MCV: 74.7 fL — ABNORMAL LOW (ref 80.0–100.0)
Platelets: 247 K/uL (ref 150–400)
RBC: 5.69 MIL/uL (ref 4.22–5.81)
RDW: 14.6 % (ref 11.5–15.5)
WBC: 8.7 K/uL (ref 4.0–10.5)
nRBC: 0 % (ref 0.0–0.2)

## 2024-07-19 LAB — COOXEMETRY PANEL
Carboxyhemoglobin: 1.1 % (ref 0.5–1.5)
Methemoglobin: <0.7 % (ref 0.0–1.5)
O2 Saturation: 52.6 %
Total hemoglobin: 13.6 g/dL (ref 12.0–16.0)

## 2024-07-19 MED ORDER — PANTOPRAZOLE SODIUM 40 MG IV SOLR
40.0000 mg | Freq: Two times a day (BID) | INTRAVENOUS | Status: DC
  Administered 2024-07-19 – 2024-07-23 (×9): 40 mg via INTRAVENOUS
  Filled 2024-07-19 (×9): qty 10

## 2024-07-19 MED ORDER — DOCUSATE SODIUM 100 MG PO CAPS
100.0000 mg | ORAL_CAPSULE | Freq: Two times a day (BID) | ORAL | Status: DC
  Administered 2024-07-19 – 2024-07-24 (×10): 100 mg via ORAL
  Filled 2024-07-19 (×10): qty 1

## 2024-07-19 NOTE — Plan of Care (Signed)

## 2024-07-19 NOTE — Progress Notes (Addendum)
 Patient ID: Jesse Key, male   DOB: 09-12-1965, 59 y.o.   MRN: [id number]     Advanced Heart Failure Rounding Note  Cardiologist: Soyla DELENA Merck, MD  [facility] Cardiologist: Dr. Cherrie  Chief Complaint: Low-output HF Patient Profile   Jesse Key is a 59 y.o. male with h/o  tobacco, cocaine and alcohol use, chronic systolic heart failure and known CAD (diffuse moderate, nonobstructive dz by cath 5/25), presented with significantly elevated troponin and active chest pain concerning for NSTEMI. UDS + for cocaine.   Significant events:   1/8: R/LHC: see imaging below. Elevated filling pressures, low CI 1.62. Started on milrinone  0.25.  1/12: s/p PCI with DES to RCA c/b small dissection of pRCA s/p second DES  Subjective:    Off Milrinone , co-ox down to 53% today. CVP 6-7   sCr 1.34>1.58>>1.75>>1.74    Feels ok. Denies dyspnea, no CP but just had BM w/ rectal bleeding, bright red. Mild straining w/ BM. Recent colonoscopy for rectal bleeding 12/25 showed 1 polyp (pathology tubular adenoma) and internal hemorrhoids    Objective:    Weight Range: 58.2 kg Body mass index is 20.71 kg/m.   Vital Signs:   Temp:  [97.5 F (36.4 C)-98.3 F (36.8 C)] 97.6 F (36.4 C) (01/16 1153) Pulse Rate:  [80-92] 92 (01/16 1153) Resp:  [15-20] 15 (01/16 1153) BP: (102-110)/(63-75) 110/75 (01/16 1153) SpO2:  [97 %-100 %] 97 % (01/16 1153) Weight:  [58.2 kg] 58.2 kg (01/16 0329) Last BM Date : 07/18/24  Weight change: Filed Weights   07/17/24 0546 07/18/24 0323 07/19/24 0329  Weight: 58.3 kg 58.2 kg 58.2 kg   Intake/Output:  Intake/Output Summary (Last 24 hours) at 07/19/2024 1232 Last data filed at 07/19/2024 0800 Gross per 24 hour  Intake 598 ml  Output --  Net 598 ml   Physical Exam   CVP 6-7   GENERAL: well appearing, NAD Lungs- clear  CARDIAC:  JVP  7 cm          Normal rate with regular rhythm. No MRG. No LEE   ABDOMEN: Soft, non-tender, non-distended.  EXTREMITIES:  Warm and well perfused.  NEUROLOGIC: No obvious FND   Telemetry   SR 80s (personally reviewed)  Labs   CBC Recent Labs    07/18/24 0435  WBC 8.7  HGB 13.9  HCT 43.8  MCV 75.1*  PLT 250   Basic Metabolic Panel Recent Labs    98/84/73 0435 07/19/24 0330  NA 136 133*  K 4.6 4.6  CL 98 96*  CO2 27 27  GLUCOSE 99 127*  BUN 39* 44*  CREATININE 1.75* 1.74*  CALCIUM  9.5 9.6   Liver Function Tests No results for input(s): AST, ALT, ALKPHOS, BILITOT, PROT, ALBUMIN in the last 72 hours.  BNP (last 3 results) Recent Labs    04/01/24 1327 04/12/24 1014 05/21/24 1004  BNP >4,500.0* 3,217.5* 3,013.7*   ProBNP (last 3 results) Recent Labs    06/23/24 2241 06/30/24 1232 07/08/24 2224  PROBNP 11,603.0* 3,740.0* 8,698.0*   Fasting Lipid Panel No results for input(s): CHOL, HDL, LDLCALC, TRIG, CHOLHDL, LDLDIRECT in the last 72 hours.  Cardiac Imaging:    LHC 07/12/23: Dominance: Right   Medications:    Scheduled Medications:  aspirin  EC  81 mg Oral Daily   Chlorhexidine  Gluconate Cloth  6 each Topical Daily   clopidogrel   75 mg Oral Q breakfast   digoxin   0.0625 mg Oral Daily   empagliflozin   10 mg Oral QAC  breakfast   enoxaparin  (LOVENOX ) injection  40 mg Subcutaneous Q24H   folic acid   1 mg Oral Daily   multivitamin with minerals  1 tablet Oral Daily   rosuvastatin   20 mg Oral Daily   sodium chloride  flush  10-40 mL Intracatheter Q12H   sodium chloride  flush  3 mL Intravenous Q12H   sodium chloride  flush  3 mL Intravenous Q12H   spironolactone   12.5 mg Oral Daily   thiamine   100 mg Oral Daily    Infusions:    PRN Medications: acetaminophen , levalbuterol , LORazepam , ondansetron  (ZOFRAN ) IV, sodium chloride  flush, sodium chloride  flush  Assessment/Plan   NSTEMI, CAD - CP on admit. Hs-trop 1,513>1,613>2,107. Cocaine +  - Nonobstructive CAD on 7/25 cath. Repeat this admission with 80% RCA (possible culprit). Possible that  elevated troponins, were troponin leak related to low output heart failure and cocaine use. - S/p PCI with DES to RCA c/b dissection of pRCA treated with second DES - stable w/o CP  - continue ASA/plavix , DAPT 77yr - continue statin  Acute on chronic systolic CHF: Nonischemic cardiomyopathy. Echo in 5/25 showed EF < 20%, RV nl, global HK, mid-mod MR. R/LHC in 5/25 showed non-obstructive CAD, well-compensated filling pressures with CI 2.07.  CMR in 5/25 showed LV EF 18%, RV EF 35%, nonspecific RV insertion site LGE.  Echo 9/25: EF 15-20%, RV mildly reduced, RVSP 55 mmHg, severe MR, severe TR, dilated IVC.  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 Echo 12/25: EF <20%, LV with GHK, RV normal, LA mod dilated, mild MR. Significant RCA disease on cath, but this does not explain his long-standing cardiomyopathy. RHC 1/8 markedly elevated right and left heart filling pressures with low CI (1.5 F/1.6 T) and PAPi 1.2. Started on milrinone . Underwent staged PCI to RCA as above.  - NYHA II at admission. Hemodynamics on RHC much worse than would be expected with symptoms.  - Now off Milrinone , Co-ox 53%, will follow  - Hold torsemide  today w/ AKI and active bleeding  - Continue digoxin  0.0625 mg daily. Lvl 0.8 - Continue Jardiance  10 mg daily - Continue spironolactone  25 mg daily.  - BP too low to restart entresto  - No beta blocker with low output.  - Poor candidate for advanced therapies with social situation. Compliance had been improved with paramedicine, until lost job and stable housing. He is now unhoused. SW has been working to get him into housing - EF remains <35% with LBBB-like IVCD. Ideally would get CRT-D, given low output heart failure and lack of candidacy at this time for VAD. Will reach out at discharge.   LBBB - QRS >150s on ECG and tele - plan as above   CKD stage 3b: AKI, resolved. Baseline ~1.5-1.6 - cardiorenal syndrome.  - on Jardiance   - Cr bumped off  milrinone  1.3>1.5>>1.8>1.8. Co-ox marginal - monitor off milrinone , hold loop diuretic today    Cocaine use Tobacco use ETOH use - Recently drank ETOH again because he is stressed by social situation - UDS + cocaine and opiates - Smokes ~3 cigarettes a day - Cessation of all advised   Rectal Bleeding - BRBPR x 1 today, likely d/t hemorrhoids (noted on recent colonoscopy 12/25) - s/p resection of polyp (tubular adenoma) 12/25 - monitor H/H closely  - continue DAPT given fresh RCA stent - add stool softener    SDOH:  - poor health literacy - Enrolled in paramedicine, compliance much improved - Has applied for disability, he is  waiting for approval - homeless  He has received notification that he has been approved for a bed in a homeless shelter starting Monday morning we will plan for discharge on Sunday evening if medically stable     Caffie Shed, PA-C 07/19/24, 12:32 PM  Advanced Heart Failure Team Pager [phone] (M-F; 7a - 5p)    Please visit [email or web address]: For overnight coverage please call cardiology fellow first. If fellow not available call Shock/ECMO MD on call.  For ECMO / Mechanical Support (Impella, IABP, LVAD) issues call Shock / ECMO MD on call.     Patient seen and examined with the above-signed Advanced Practice Provider and/or Housestaff. I personally reviewed laboratory data, imaging studies and relevant notes. I independently examined the patient and formulated the important aspects of the plan. I have edited the note to reflect any of my changes or salient points. I have personally discussed the plan with the patient and/or family.  Now of milrinone . Co-ox has dropped. Feels ok. Had about a cupful of BRBPR this am. No pain. No further bleeding  General:  Sitting up. No resp difficulty HEENT: normal Neck: supple. no JVD.  Cor: Regular rate & rhythm. No rubs, gallops or murmurs. Lungs: clear Abdomen: soft, nontender, nondistended.Good bowel  sounds. Extremities: no cyanosis, clubbing, rash, edema Neuro: alert & orientedx3, cranial nerves grossly intact. moves all 4 extremities w/o difficulty. Affect pleasant  Co-ox has dropped off milrinone . Will continue to follow. He is currently not candidate fro home inotropes or advanced therapies   Rectal bleeding likely diverticular. Will continue to follow.   Toribio Fuel, MD  7:54 PM

## 2024-07-19 NOTE — Plan of Care (Signed)
" °  Problem: Education: Goal: Knowledge of General Education information will improve Description: Including pain rating scale, medication(s)/side effects and non-pharmacologic comfort measures Outcome: Progressing   Problem: Clinical Measurements: Goal: Diagnostic test results will improve Outcome: Progressing Goal: Respiratory complications will improve Outcome: Progressing Goal: Cardiovascular complication will be avoided Outcome: Progressing   Problem: Nutrition: Goal: Adequate nutrition will be maintained Outcome: Progressing   Problem: Pain Managment: Goal: General experience of comfort will improve and/or be controlled Outcome: Progressing   Problem: Safety: Goal: Ability to remain free from injury will improve Outcome: Progressing   Problem: Activity: Goal: Capacity to carry out activities will improve Outcome: Progressing   "

## 2024-07-19 NOTE — TOC Progression Note (Signed)
 Transition of Care [facility]) - Progression Note    Patient Details  Name: Jesse Key MRN: [id number] Date of Birth: [birth year]  Transition of [facility]) CM/SW Contact  [name] Phone Number: [phone] 07/19/2024, 12:36 PM  Clinical Narrative:   HF CSW met with patient at bedside. Patient stated that he was offered a bed at [facility] ([facility] Patient stated that they will admit him on Thursday, July 25, 2024. Patient stated that he is familiar with the [facility] and plan to go there until then.   HF CSW/CM will continue to follow and monitor for dc readiness.     Expected Discharge Plan: Homeless Shelter Barriers to Discharge: Homeless with medical needs               Expected Discharge Plan and Services       Living arrangements for the past 2 months: Homeless                                       Social Drivers of Health (SDOH) Interventions SDOH Screenings   Food Insecurity: Food Insecurity Present (07/09/2024)  Housing: High Risk (07/09/2024)  Transportation Needs: Unmet Transportation Needs (07/09/2024)  Utilities: At Risk (07/09/2024)  Depression (PHQ2-9): Medium Risk (06/04/2024)  Financial Resource Strain: High Risk (04/12/2024)  Stress: Stress Concern Present (06/04/2024)  Tobacco Use: High Risk (07/08/2024)  Health Literacy: Adequate Health Literacy (01/03/2024)    Readmission Risk Interventions    06/24/2024    3:25 PM  Readmission Risk Prevention Plan  Transportation Screening Complete  Home Care Screening Complete  Medication Review (RN CM) Complete

## 2024-07-20 DIAGNOSIS — I5023 Acute on chronic systolic (congestive) heart failure: Secondary | ICD-10-CM | POA: Diagnosis not present

## 2024-07-20 LAB — CBC
HCT: 41.4 % (ref 39.0–52.0)
Hemoglobin: 13 g/dL (ref 13.0–17.0)
MCH: 23.7 pg — ABNORMAL LOW (ref 26.0–34.0)
MCHC: 31.4 g/dL (ref 30.0–36.0)
MCV: 75.5 fL — ABNORMAL LOW (ref 80.0–100.0)
Platelets: 244 K/uL (ref 150–400)
RBC: 5.48 MIL/uL (ref 4.22–5.81)
RDW: 14.7 % (ref 11.5–15.5)
WBC: 7.6 K/uL (ref 4.0–10.5)
nRBC: 0 % (ref 0.0–0.2)

## 2024-07-20 LAB — BASIC METABOLIC PANEL WITH GFR
Anion gap: 10 (ref 5–15)
BUN: 34 mg/dL — ABNORMAL HIGH (ref 6–20)
CO2: 24 mmol/L (ref 22–32)
Calcium: 9 mg/dL (ref 8.9–10.3)
Chloride: 102 mmol/L (ref 98–111)
Creatinine, Ser: 1.61 mg/dL — ABNORMAL HIGH (ref 0.61–1.24)
GFR, Estimated: 49 mL/min — ABNORMAL LOW
Glucose, Bld: 106 mg/dL — ABNORMAL HIGH (ref 70–99)
Potassium: 4.7 mmol/L (ref 3.5–5.1)
Sodium: 136 mmol/L (ref 135–145)

## 2024-07-20 LAB — COOXEMETRY PANEL
Carboxyhemoglobin: 1.5 % (ref 0.5–1.5)
Methemoglobin: 0.8 % (ref 0.0–1.5)
O2 Saturation: 59.2 %
Total hemoglobin: 13.4 g/dL (ref 12.0–16.0)

## 2024-07-20 NOTE — Progress Notes (Signed)
 Patient ID: TEL HEVIA, male   DOB: [birth year], 59 y.o.   MRN: [id number]     Advanced Heart Failure Rounding Note  Cardiologist: Soyla DELENA Merck, MD  [facility] Cardiologist: Dr. Cherrie  Chief Complaint: Low-output HF Patient Profile   Jesse Key is a 59 y.o. male with h/o  tobacco, cocaine and alcohol use, chronic systolic heart failure and known CAD (diffuse moderate, nonobstructive dz by cath 5/25), presented with significantly elevated troponin and active chest pain concerning for NSTEMI. UDS + for cocaine.   Significant events:   1/8: R/LHC: see imaging below. Elevated filling pressures, low CI 1.62. Started on milrinone  0.25.  1/12: s/p PCI with DES to RCA c/b small dissection of pRCA s/p second DES  Subjective:    Off Milrinone , co-ox 59% today. CVP 6-7   sCr 1.34>1.58>>1.75>>1.74 > 1.6  Feels good. No further BRBPR. Denies SOB or orthopnea.    Objective:    Weight Range: 59.2 kg Body mass index is 21.07 kg/m.   Vital Signs:   Temp:  [97.6 F (36.4 C)-98.5 F (36.9 C)] 97.6 F (36.4 C) (01/17 1121) Pulse Rate:  [85-91] 88 (01/17 1121) Resp:  [16-20] 16 (01/17 1121) BP: (99-115)/(67-73) 106/67 (01/17 1121) SpO2:  [95 %-99 %] 98 % (01/17 1121) Weight:  [59.2 kg] 59.2 kg (01/17 0358) Last BM Date : 07/19/24  Weight change: Filed Weights   07/18/24 0323 07/19/24 0329 07/20/24 0358  Weight: 58.2 kg 58.2 kg 59.2 kg   Intake/Output:  Intake/Output Summary (Last 24 hours) at 07/20/2024 1434 Last data filed at 07/20/2024 0900 Gross per 24 hour  Intake 660 ml  Output 1700 ml  Net -1040 ml   Physical Exam   General:  Ambulating room No resp difficulty HEENT: normal Neck: supple. no JVD.  Cor: Regular rate & rhythm. No rubs, gallops or murmurs. Lungs: clear Abdomen: soft, nontender, nondistended.Good bowel sounds. Extremities: no cyanosis, clubbing, rash, edema Neuro: alert & orientedx3, cranial nerves grossly intact. moves all 4 extremities w/o  difficulty. Affect pleasant   Telemetry   SR 80s (personally reviewed)  Labs   CBC Recent Labs    07/19/24 1303 07/20/24 0412  WBC 8.7 7.6  HGB 13.6 13.0  HCT 42.5 41.4  MCV 74.7* 75.5*  PLT 247 244   Basic Metabolic Panel Recent Labs    98/83/73 0330 07/20/24 0412  NA 133* 136  K 4.6 4.7  CL 96* 102  CO2 27 24  GLUCOSE 127* 106*  BUN 44* 34*  CREATININE 1.74* 1.61*  CALCIUM  9.6 9.0   Liver Function Tests No results for input(s): AST, ALT, ALKPHOS, BILITOT, PROT, ALBUMIN in the last 72 hours.  BNP (last 3 results) Recent Labs    04/01/24 1327 04/12/24 1014 05/21/24 1004  BNP >4,500.0* 3,217.5* 3,013.7*   ProBNP (last 3 results) Recent Labs    06/23/24 2241 06/30/24 1232 07/08/24 2224  PROBNP 11,603.0* 3,740.0* 8,698.0*   Fasting Lipid Panel No results for input(s): CHOL, HDL, LDLCALC, TRIG, CHOLHDL, LDLDIRECT in the last 72 hours.  Cardiac Imaging:    LHC 07/12/23: Dominance: Right   Medications:    Scheduled Medications:  aspirin  EC  81 mg Oral Daily   Chlorhexidine  Gluconate Cloth  6 each Topical Daily   clopidogrel   75 mg Oral Q breakfast   digoxin   0.0625 mg Oral Daily   docusate sodium   100 mg Oral BID   empagliflozin   10 mg Oral QAC breakfast   enoxaparin  (LOVENOX ) injection  40  mg Subcutaneous Q24H   folic acid   1 mg Oral Daily   multivitamin with minerals  1 tablet Oral Daily   pantoprazole  (PROTONIX ) IV  40 mg Intravenous BID   rosuvastatin   20 mg Oral Daily   sodium chloride  flush  10-40 mL Intracatheter Q12H   sodium chloride  flush  3 mL Intravenous Q12H   sodium chloride  flush  3 mL Intravenous Q12H   spironolactone   12.5 mg Oral Daily   thiamine   100 mg Oral Daily    Infusions:    PRN Medications: acetaminophen , levalbuterol , LORazepam , ondansetron  (ZOFRAN ) IV, sodium chloride  flush, sodium chloride  flush  Assessment/Plan   NSTEMI, CAD - CP on admit. Hs-trop 1,513>1,613>2,107. Cocaine +  -  Nonobstructive CAD on 7/25 cath. Repeat this admission with 80% RCA (possible culprit). Possible that elevated troponins, were troponin leak related to low output heart failure and cocaine use. - S/p PCI with DES to RCA c/b dissection of pRCA treated with second DES - no s/s angina - continue ASA/plavix , DAPT 56yr - continue statin  Acute on chronic systolic CHF: Nonischemic cardiomyopathy. Echo in 5/25 showed EF < 20%, RV nl, global HK, mid-mod MR. R/LHC in 5/25 showed non-obstructive CAD, well-compensated filling pressures with CI 2.07.  CMR in 5/25 showed LV EF 18%, RV EF 35%, nonspecific RV insertion site LGE.  Echo 9/25: EF 15-20%, RV mildly reduced, RVSP 55 mmHg, severe MR, severe TR, dilated IVC.  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 Echo 12/25: EF <20%, LV with GHK, RV normal, LA mod dilated, mild MR. Significant RCA disease on cath, but this does not explain his long-standing cardiomyopathy. RHC 1/8 markedly elevated right and left heart filling pressures with low CI (1.5 F/1.6 T) and PAPi 1.2. Started on milrinone . Underwent staged PCI to RCA as above.  - Now off Milrinone , Co-ox 59%, will follow  - Off torsemide  today. Resume in am  - Continue digoxin  0.0625 mg daily. Lvl 0.8 - Continue Jardiance  10 mg daily - Continue spironolactone  25 mg daily.  - BP too low to restart entresto  - No beta blocker with low output.  - Poor candidate for advanced therapies with social situation. Compliance had been improved with paramedicine, until lost job and stable housing. He is now unhoused. SW has been working to get him into housing - EF remains <35% with LBBB-like IVCD. Ideally would get CRT-D, given low output heart failure and lack of candidacy at this time for VAD. Have d/w EP  LBBB - QRS >150s on ECG and tele - plan as above   CKD stage 3b: AKI, resolved. Baseline ~1.5-1.6 - cardiorenal syndrome.  - on Jardiance   - Stable Scr 1.6    Cocaine use Tobacco  use ETOH use - Recently drank ETOH again because he is stressed by social situation - UDS + cocaine and opiates - Smokes ~3 cigarettes a day - Cessation of all advised   Rectal Bleeding - BRBPR x 1 on 1/16  likely d/t hemorrhoids vs diverticular (noted on recent colonoscopy 12/25) - s/p resection of polyp (tubular adenoma) 12/25 - monitor H/H closely  - resolved - continue DAPT given fresh RCA stent   SDOH:  - poor health literacy - Enrolled in paramedicine, compliance much improved - Has applied for disability, he is waiting for approval - homeless  He has received notification that he has been approved for a bed in a homeless shelter starting Monday morning we will plan for discharge on Sunday  evening if medically stable     Toribio Fuel, MD 07/20/24, 2:34 PM  Advanced Heart Failure Team Pager [phone] (M-F; 7a - 5p)    Please visit [email or web address]: For overnight coverage please call cardiology fellow first. If fellow not available call Shock/ECMO MD on call.  For ECMO / Mechanical Support (Impella, IABP, LVAD) issues call Shock / ECMO MD on call.

## 2024-07-20 NOTE — Plan of Care (Signed)

## 2024-07-21 DIAGNOSIS — I5023 Acute on chronic systolic (congestive) heart failure: Secondary | ICD-10-CM | POA: Diagnosis not present

## 2024-07-21 LAB — BASIC METABOLIC PANEL WITH GFR
Anion gap: 10 (ref 5–15)
BUN: 23 mg/dL — ABNORMAL HIGH (ref 6–20)
CO2: 26 mmol/L (ref 22–32)
Calcium: 9.3 mg/dL (ref 8.9–10.3)
Chloride: 104 mmol/L (ref 98–111)
Creatinine, Ser: 1.29 mg/dL — ABNORMAL HIGH (ref 0.61–1.24)
GFR, Estimated: >60 mL/min
Glucose, Bld: 96 mg/dL (ref 70–99)
Potassium: 4.9 mmol/L (ref 3.5–5.1)
Sodium: 139 mmol/L (ref 135–145)

## 2024-07-21 LAB — CBC
HCT: 42.1 % (ref 39.0–52.0)
Hemoglobin: 13.2 g/dL (ref 13.0–17.0)
MCH: 23.8 pg — ABNORMAL LOW (ref 26.0–34.0)
MCHC: 31.4 g/dL (ref 30.0–36.0)
MCV: 75.9 fL — ABNORMAL LOW (ref 80.0–100.0)
Platelets: 256 K/uL (ref 150–400)
RBC: 5.55 MIL/uL (ref 4.22–5.81)
RDW: 14.9 % (ref 11.5–15.5)
WBC: 7.5 K/uL (ref 4.0–10.5)
nRBC: 0 % (ref 0.0–0.2)

## 2024-07-21 LAB — COOXEMETRY PANEL
Carboxyhemoglobin: 1.9 % — ABNORMAL HIGH (ref 0.5–1.5)
Methemoglobin: <0.7 % (ref 0.0–1.5)
O2 Saturation: 67.4 %
Total hemoglobin: 13.2 g/dL (ref 12.0–16.0)

## 2024-07-21 MED ORDER — TORSEMIDE 20 MG PO TABS
20.0000 mg | ORAL_TABLET | Freq: Every day | ORAL | Status: DC
  Administered 2024-07-22 – 2024-07-23 (×2): 20 mg via ORAL
  Filled 2024-07-21 (×2): qty 1

## 2024-07-21 NOTE — Progress Notes (Signed)
 Patient ID: Jesse Key, male   DOB: January 05, 1966, 59 y.o.   MRN: [id number]     Advanced Heart Failure Rounding Note  Cardiologist: Soyla DELENA Merck, MD  [facility] Cardiologist: Dr. Cherrie  Chief Complaint: Low-output HF Patient Profile   Jesse Key is a 59 y.o. male with h/o  tobacco, cocaine and alcohol use, chronic systolic heart failure and known CAD (diffuse moderate, nonobstructive dz by cath 5/25), presented with significantly elevated troponin and active chest pain concerning for NSTEMI. UDS + for cocaine.   Significant events:   1/8: R/LHC: see imaging below. Elevated filling pressures, low CI 1.62. Started on milrinone  0.25.  1/12: s/p PCI with DES to RCA c/b small dissection of pRCA s/p second DES  Subjective:    Off Milrinone , co-ox 67% today. CVP 5  Scr improved   Objective:    Weight Range: 59.1 kg Body mass index is 21.03 kg/m.   Vital Signs:   Temp:  [97.6 F (36.4 C)-98.4 F (36.9 C)] 97.7 F (36.5 C) (01/18 0719) Pulse Rate:  [80-92] 89 (01/18 0719) Resp:  [14-20] 14 (01/18 0719) BP: (97-111)/(64-69) 97/69 (01/18 0719) SpO2:  [96 %-100 %] 100 % (01/18 0719) Weight:  [59.1 kg] 59.1 kg (01/18 0417) Last BM Date : 07/19/24  Weight change: Filed Weights   07/19/24 0329 07/20/24 0358 07/21/24 0417  Weight: 58.2 kg 59.2 kg 59.1 kg   Intake/Output:  Intake/Output Summary (Last 24 hours) at 07/21/2024 1014 Last data filed at 07/21/2024 9277 Gross per 24 hour  Intake 360 ml  Output --  Net 360 ml   Physical Exam   General:  Ambulating room No resp difficulty HEENT: normal Neck: supple. no JVD.  Cor: Regular rate & rhythm. No rubs, gallops or murmurs. Lungs: clear Abdomen: soft, nontender, nondistended.Good bowel sounds. Extremities: no cyanosis, clubbing, rash, edema Neuro: alert & orientedx3, cranial nerves grossly intact. moves all 4 extremities w/o difficulty. Affect pleasant    Telemetry   SR 80s (personally reviewed)  Labs    CBC Recent Labs    07/20/24 0412 07/21/24 0500  WBC 7.6 7.5  HGB 13.0 13.2  HCT 41.4 42.1  MCV 75.5* 75.9*  PLT 244 256   Basic Metabolic Panel Recent Labs    98/82/73 0412 07/21/24 0500  NA 136 139  K 4.7 4.9  CL 102 104  CO2 24 26  GLUCOSE 106* 96  BUN 34* 23*  CREATININE 1.61* 1.29*  CALCIUM  9.0 9.3   Liver Function Tests No results for input(s): AST, ALT, ALKPHOS, BILITOT, PROT, ALBUMIN in the last 72 hours.  BNP (last 3 results) Recent Labs    04/01/24 1327 04/12/24 1014 05/21/24 1004  BNP >4,500.0* 3,217.5* 3,013.7*   ProBNP (last 3 results) Recent Labs    06/23/24 2241 06/30/24 1232 07/08/24 2224  PROBNP 11,603.0* 3,740.0* 8,698.0*   Fasting Lipid Panel No results for input(s): CHOL, HDL, LDLCALC, TRIG, CHOLHDL, LDLDIRECT in the last 72 hours.  Cardiac Imaging:    LHC 07/12/23: Dominance: Right   Medications:    Scheduled Medications:  aspirin  EC  81 mg Oral Daily   Chlorhexidine  Gluconate Cloth  6 each Topical Daily   clopidogrel   75 mg Oral Q breakfast   digoxin   0.0625 mg Oral Daily   docusate sodium   100 mg Oral BID   empagliflozin   10 mg Oral QAC breakfast   enoxaparin  (LOVENOX ) injection  40 mg Subcutaneous Q24H   folic acid   1 mg Oral Daily  multivitamin with minerals  1 tablet Oral Daily   pantoprazole  (PROTONIX ) IV  40 mg Intravenous BID   rosuvastatin   20 mg Oral Daily   sodium chloride  flush  10-40 mL Intracatheter Q12H   sodium chloride  flush  3 mL Intravenous Q12H   sodium chloride  flush  3 mL Intravenous Q12H   spironolactone   12.5 mg Oral Daily   thiamine   100 mg Oral Daily    Infusions:    PRN Medications: acetaminophen , levalbuterol , LORazepam , ondansetron  (ZOFRAN ) IV, sodium chloride  flush, sodium chloride  flush  Assessment/Plan   NSTEMI, CAD - CP on admit. Hs-trop 1,513>1,613>2,107. Cocaine +  - Nonobstructive CAD on 7/25 cath. Repeat this admission with 80% RCA (possible  culprit). Possible that elevated troponins, were troponin leak related to low output heart failure and cocaine use. - S/p PCI with DES to RCA c/b dissection of pRCA treated with second DES - no s/s angina - continue ASA/plavix , DAPT 63yr - continue statin  Acute on chronic systolic CHF: Nonischemic cardiomyopathy. Echo in 5/25 showed EF < 20%, RV nl, global HK, mid-mod MR. R/LHC in 5/25 showed non-obstructive CAD, well-compensated filling pressures with CI 2.07.  CMR in 5/25 showed LV EF 18%, RV EF 35%, nonspecific RV insertion site LGE.  Echo 9/25: EF 15-20%, RV mildly reduced, RVSP 55 mmHg, severe MR, severe TR, dilated IVC.  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 Echo 12/25: EF <20%, LV with GHK, RV normal, LA mod dilated, mild MR. Significant RCA disease on cath, but this does not explain his long-standing cardiomyopathy. RHC 1/8 markedly elevated right and left heart filling pressures with low CI (1.5 F/1.6 T) and PAPi 1.2. Started on milrinone . Underwent staged PCI to RCA as above.  - Now off Milrinone , Co-ox 67%, can stop checking - Off torsemide  today. Resume in am - 20 daily - Continue digoxin  0.0625 mg daily. Lvl 0.8 - Continue Jardiance  10 mg daily - Continue spironolactone  25 mg daily.  - BP too low to restart entresto  - No beta blocker with low output.  - Poor candidate for advanced therapies with social situation. Compliance had been improved with paramedicine, until lost job and stable housing. He is now unhoused. SW has been working to get him into housing - EF remains <35% with LBBB-like IVCD. Ideally would get CRT-D, given low output heart failure and lack of candidacy at this time for VAD. Have d/w EP  LBBB - QRS >150s on ECG and tele - plan as above   CKD stage 3b: AKI, resolved. Baseline ~1.5-1.6 - cardiorenal syndrome.  - on Jardiance   - Scr 1.29 today    Cocaine use Tobacco use ETOH use - Recently drank ETOH again because he is stressed  by social situation - UDS + cocaine and opiates - Smokes ~3 cigarettes a day - Cessation of all advised   Rectal Bleeding - BRBPR x 1 on 1/16  likely d/t hemorrhoids vs diverticular (noted on recent colonoscopy 12/25) - s/p resection of polyp (tubular adenoma) 12/25 - monitor H/H closely  - resolved - continue DAPT given fresh RCA stent   SDOH:  - poor health literacy - Enrolled in paramedicine, compliance much improved - Has applied for disability, he is waiting for approval - homeless  He has received notification that he has been approved for a bed in a homeless shelter but now not available until Thursday. Will keep overnight given weather and d/w SW in am     Jesse Kabel,  MD 07/21/24, 10:14 AM  Advanced Heart Failure Team Pager [phone] (M-F; 7a - 5p)    Please visit [email or web address]: For overnight coverage please call cardiology fellow first. If fellow not available call Shock/ECMO MD on call.  For ECMO / Mechanical Support (Impella, IABP, LVAD) issues call Shock / ECMO MD on call.

## 2024-07-21 NOTE — Plan of Care (Signed)
" °  Problem: Education: Goal: Knowledge of General Education information will improve Description: Including pain rating scale, medication(s)/side effects and non-pharmacologic comfort measures Outcome: Progressing   Problem: Health Behavior/Discharge Planning: Goal: Ability to manage health-related needs will improve Outcome: Progressing   Problem: Clinical Measurements: Goal: Ability to maintain clinical measurements within normal limits will improve Outcome: Progressing Goal: Will remain free from infection Outcome: Progressing Goal: Diagnostic test results will improve Outcome: Progressing Goal: Respiratory complications will improve Outcome: Progressing Goal: Cardiovascular complication will be avoided Outcome: Progressing   Problem: Activity: Goal: Risk for activity intolerance will decrease Outcome: Progressing   Problem: Nutrition: Goal: Adequate nutrition will be maintained Outcome: Progressing   Problem: Coping: Goal: Level of anxiety will decrease Outcome: Progressing   Problem: Elimination: Goal: Will not experience complications related to bowel motility Outcome: Progressing Goal: Will not experience complications related to urinary retention Outcome: Progressing   Problem: Pain Managment: Goal: General experience of comfort will improve and/or be controlled Outcome: Progressing   Problem: Safety: Goal: Ability to remain free from injury will improve Outcome: Progressing   Problem: Skin Integrity: Goal: Risk for impaired skin integrity will decrease Outcome: Progressing   Problem: Education: Goal: Understanding of CV disease, CV risk reduction, and recovery process will improve Outcome: Progressing Goal: Individualized Educational Video(s) Outcome: Progressing   Problem: Activity: Goal: Ability to return to baseline activity level will improve Outcome: Progressing   Problem: Cardiovascular: Goal: Ability to achieve and maintain adequate  cardiovascular perfusion will improve Outcome: Progressing Goal: Vascular access site(s) Level 0-1 will be maintained Outcome: Progressing   Problem: Health Behavior/Discharge Planning: Goal: Ability to safely manage health-related needs after discharge will improve Outcome: Progressing   Problem: Education: Goal: Understanding of CV disease, CV risk reduction, and recovery process will improve Outcome: Progressing Goal: Individualized Educational Video(s) Outcome: Progressing   Problem: Activity: Goal: Ability to return to baseline activity level will improve Outcome: Progressing   Problem: Cardiovascular: Goal: Ability to achieve and maintain adequate cardiovascular perfusion will improve Outcome: Progressing Goal: Vascular access site(s) Level 0-1 will be maintained Outcome: Progressing   Problem: Health Behavior/Discharge Planning: Goal: Ability to safely manage health-related needs after discharge will improve Outcome: Progressing   Problem: Education: Goal: Ability to demonstrate management of disease process will improve Outcome: Progressing Goal: Ability to verbalize understanding of medication therapies will improve Outcome: Progressing Goal: Individualized Educational Video(s) Outcome: Progressing   Problem: Activity: Goal: Capacity to carry out activities will improve Outcome: Progressing   Problem: Cardiac: Goal: Ability to achieve and maintain adequate cardiopulmonary perfusion will improve Outcome: Progressing   "

## 2024-07-21 NOTE — Plan of Care (Signed)

## 2024-07-22 ENCOUNTER — Ambulatory Visit (HOSPITAL_COMMUNITY): Payer: Self-pay

## 2024-07-22 DIAGNOSIS — I5023 Acute on chronic systolic (congestive) heart failure: Secondary | ICD-10-CM | POA: Diagnosis not present

## 2024-07-22 LAB — BASIC METABOLIC PANEL WITH GFR
Anion gap: 8 (ref 5–15)
BUN: 18 mg/dL (ref 6–20)
CO2: 19 mmol/L — ABNORMAL LOW (ref 22–32)
Calcium: 6.5 mg/dL — ABNORMAL LOW (ref 8.9–10.3)
Chloride: 115 mmol/L — ABNORMAL HIGH (ref 98–111)
Creatinine, Ser: 0.95 mg/dL (ref 0.61–1.24)
GFR, Estimated: >60 mL/min
Glucose, Bld: 75 mg/dL (ref 70–99)
Potassium: 3.2 mmol/L — ABNORMAL LOW (ref 3.5–5.1)
Sodium: 142 mmol/L (ref 135–145)

## 2024-07-22 LAB — COOXEMETRY PANEL
Carboxyhemoglobin: 1.5 % (ref 0.5–1.5)
Methemoglobin: <0.7 % (ref 0.0–1.5)
O2 Saturation: 59.4 %
Total hemoglobin: 13.1 g/dL (ref 12.0–16.0)

## 2024-07-22 MED ORDER — SPIRONOLACTONE 25 MG PO TABS
25.0000 mg | ORAL_TABLET | Freq: Every day | ORAL | Status: DC
  Administered 2024-07-23 – 2024-07-24 (×2): 25 mg via ORAL
  Filled 2024-07-22 (×2): qty 1

## 2024-07-22 MED ORDER — POTASSIUM CHLORIDE CRYS ER 20 MEQ PO TBCR
40.0000 meq | EXTENDED_RELEASE_TABLET | Freq: Once | ORAL | Status: AC
  Administered 2024-07-22: 40 meq via ORAL
  Filled 2024-07-22: qty 2

## 2024-07-22 NOTE — Progress Notes (Signed)
 Patient ID: Jesse Key, male   DOB: [birth year], 59 y.o.   MRN: [id number]     Advanced Heart Failure Rounding Note  Cardiologist: Soyla DELENA Merck, MD  [facility] Cardiologist: Dr. Cherrie  Chief Complaint: Low-output HF Patient Profile   Jesse Key is a 59 y.o. male with h/o  tobacco, cocaine and alcohol use, chronic systolic heart failure and known CAD (diffuse moderate, nonobstructive dz by cath 5/25), presented with significantly elevated troponin and active chest pain concerning for NSTEMI. UDS + for cocaine.   Significant events:   1/8: R/LHC: see imaging below. Elevated filling pressures, low CI 1.62. Started on milrinone  0.25.  1/12: s/p PCI with DES to RCA c/b small dissection of pRCA s/p second DES  Subjective:    Co-ix 59% off milrinone .  Unable to obtain CVP, poor waveform. However, it was 2 this am.  Feels okay. No shortness of breath. Worried about where he will stay the next 2 days.   Objective:    Weight Range: 59.6 kg Body mass index is 21.21 kg/m.   Vital Signs:   Temp:  [97.6 F (36.4 C)-98.5 F (36.9 C)] 98.1 F (36.7 C) (01/19 1119) Pulse Rate:  [75-89] 89 (01/19 1119) Resp:  [17-19] 18 (01/19 1119) BP: (101-110)/(63-73) 105/63 (01/19 1119) SpO2:  [98 %-100 %] 100 % (01/19 1119) Weight:  [59.6 kg] 59.6 kg (01/19 0517) Last BM Date : 07/21/24  Weight change: Filed Weights   07/20/24 0358 07/21/24 0417 07/22/24 0517  Weight: 59.2 kg 59.1 kg 59.6 kg   Intake/Output:  Intake/Output Summary (Last 24 hours) at 07/22/2024 1337 Last data filed at 07/22/2024 1121 Gross per 24 hour  Intake 240 ml  Output --  Net 240 ml   Physical Exam   General:  Thin, chronically ill appearing Cor: Jvp 6-7. Regular rate & rhythm. No murmurs. Lungs: clear Abdomen: soft, nontender, nondistended. Extremities: no edema Neuro: alert & orientedx3. Affect pleasant    Telemetry   SR 80s  Labs   CBC Recent Labs    07/20/24 0412 07/21/24 0500  WBC  7.6 7.5  HGB 13.0 13.2  HCT 41.4 42.1  MCV 75.5* 75.9*  PLT 244 256   Basic Metabolic Panel Recent Labs    98/81/73 0500 07/22/24 0625  NA 139 142  K 4.9 3.2*  CL 104 115*  CO2 26 19*  GLUCOSE 96 75  BUN 23* 18  CREATININE 1.29* 0.95  CALCIUM  9.3 6.5*   Liver Function Tests No results for input(s): AST, ALT, ALKPHOS, BILITOT, PROT, ALBUMIN in the last 72 hours.  BNP (last 3 results) Recent Labs    04/01/24 1327 04/12/24 1014 05/21/24 1004  BNP >4,500.0* 3,217.5* 3,013.7*   ProBNP (last 3 results) Recent Labs    06/23/24 2241 06/30/24 1232 07/08/24 2224  PROBNP 11,603.0* 3,740.0* 8,698.0*   Fasting Lipid Panel No results for input(s): CHOL, HDL, LDLCALC, TRIG, CHOLHDL, LDLDIRECT in the last 72 hours.  Cardiac Imaging:    LHC 07/12/23: Dominance: Right   Medications:    Scheduled Medications:  aspirin  EC  81 mg Oral Daily   Chlorhexidine  Gluconate Cloth  6 each Topical Daily   clopidogrel   75 mg Oral Q breakfast   digoxin   0.0625 mg Oral Daily   docusate sodium   100 mg Oral BID   empagliflozin   10 mg Oral QAC breakfast   enoxaparin  (LOVENOX ) injection  40 mg Subcutaneous Q24H   folic acid   1 mg Oral Daily   multivitamin with  minerals  1 tablet Oral Daily   pantoprazole  (PROTONIX ) IV  40 mg Intravenous BID   rosuvastatin   20 mg Oral Daily   sodium chloride  flush  10-40 mL Intracatheter Q12H   sodium chloride  flush  3 mL Intravenous Q12H   sodium chloride  flush  3 mL Intravenous Q12H   [START ON 07/23/2024] spironolactone   25 mg Oral Daily   thiamine   100 mg Oral Daily   torsemide   20 mg Oral Daily    Infusions:    PRN Medications: acetaminophen , levalbuterol , LORazepam , ondansetron  (ZOFRAN ) IV, sodium chloride  flush, sodium chloride  flush  Assessment/Plan   NSTEMI, CAD - CP on admit. Hs-trop 1,513>1,613>2,107. Cocaine +  - Nonobstructive CAD on 7/25 cath. Repeat this admission with 80% RCA (possible culprit). Possible  that elevated troponins, were troponin leak related to low output heart failure and cocaine use. - S/p PCI with DES to RCA c/b dissection of pRCA treated with second DES - no s/s angina - continue ASA/plavix , DAPT 62yr - continue statin  Acute on chronic systolic CHF: Nonischemic cardiomyopathy. Echo in 5/25 showed EF < 20%, RV nl, global HK, mid-mod MR. R/LHC in 5/25 showed non-obstructive CAD, well-compensated filling pressures with CI 2.07.  CMR in 5/25 showed LV EF 18%, RV EF 35%, nonspecific RV insertion site LGE.  Echo 9/25: EF 15-20%, RV mildly reduced, RVSP 55 mmHg, severe MR, severe TR, dilated IVC.  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 Echo 12/25: EF <20%, LV with GHK, RV normal, LA mod dilated, mild MR. Significant RCA disease on cath, but this does not explain his long-standing cardiomyopathy. RHC 1/8 markedly elevated right and left heart filling pressures with low CI (1.5 F/1.6 T) and PAPi 1.2. Started on milrinone . Underwent staged PCI to RCA as above.  - Now off Milrinone , Co-ox stable. Stop checking. - Volume status looks good. Continue Torsemide  20 mg daily - Continue digoxin  0.0625 mg daily. Lvl 0.8 on 01/06. Recheck tomorrow am. - Continue Jardiance  10 mg daily - Continue spironolactone  25 mg daily.  - BP too low to restart entresto  - No beta blocker with low output.  - Poor candidate for advanced therapies with social situation. Compliance had been improved with paramedicine, until lost job and stable housing. He is now unhoused. SW has been working to get him into housing - EF remains <35% with LBBB-like IVCD. Ideally would get CRT-D, given low output heart failure and lack of candidacy at this time for VAD. Have d/w EP - has outpatient appointment scheduled.   LBBB - QRS >150s on ECG and tele - plan as above   CKD stage 3b: AKI, resolved. Baseline had been ~1.5-1.6. ? 0.95 this am - cardiorenal syndrome.  - on Jardiance    Cocaine  use Tobacco use ETOH use - Recently drank ETOH again because he is stressed by social situation - UDS + cocaine and opiates - Smokes ~3 cigarettes a day - Cessation of all advised   Rectal Bleeding - BRBPR x 1 on 1/16  likely d/t hemorrhoids vs diverticular (noted on recent colonoscopy 12/25) - s/p resection of polyp (tubular adenoma) 12/25 - monitor H/H closely  - resolved - continue DAPT given fresh RCA stent   SDOH:  - poor health literacy - Enrolled in paramedicine, compliance much improved - Recently denied disability - homeless  He has received notification that he has been approved for a bed in a homeless shelter but now not available until Thursday.   Potentially  could go to a [employer] if temps drop below 32 degrees, but no guaranteed availability. Will keep tonight d/t weather.    Joell Usman N, PA-C 07/22/24, 1:37 PM  Advanced Heart Failure Team Pager [phone] (M-F; 7a - 5p)    Please visit [email or web address]: For overnight coverage please call cardiology fellow first. If fellow not available call Shock/ECMO MD on call.  For ECMO / Mechanical Support (Impella, IABP, LVAD) issues call Shock / ECMO MD on call.

## 2024-07-23 DIAGNOSIS — I5023 Acute on chronic systolic (congestive) heart failure: Secondary | ICD-10-CM | POA: Diagnosis not present

## 2024-07-23 LAB — COOXEMETRY PANEL
Carboxyhemoglobin: 1.5 % (ref 0.5–1.5)
Methemoglobin: <0.7 % (ref 0.0–1.5)
O2 Saturation: 55.1 %
Total hemoglobin: 14.5 g/dL (ref 12.0–16.0)

## 2024-07-23 LAB — MAGNESIUM: Magnesium: 2.2 mg/dL (ref 1.7–2.4)

## 2024-07-23 LAB — BASIC METABOLIC PANEL WITH GFR
Anion gap: 10 (ref 5–15)
BUN: 28 mg/dL — ABNORMAL HIGH (ref 6–20)
CO2: 24 mmol/L (ref 22–32)
Calcium: 8.8 mg/dL — ABNORMAL LOW (ref 8.9–10.3)
Chloride: 103 mmol/L (ref 98–111)
Creatinine, Ser: 1.65 mg/dL — ABNORMAL HIGH (ref 0.61–1.24)
GFR, Estimated: 47 mL/min — ABNORMAL LOW
Glucose, Bld: 113 mg/dL — ABNORMAL HIGH (ref 70–99)
Potassium: 4.4 mmol/L (ref 3.5–5.1)
Sodium: 137 mmol/L (ref 135–145)

## 2024-07-23 LAB — DIGOXIN LEVEL: Digoxin Level: <0.6 ng/mL — ABNORMAL LOW (ref 0.8–2.0)

## 2024-07-23 MED ORDER — FUROSEMIDE 10 MG/ML IJ SOLN
80.0000 mg | Freq: Once | INTRAMUSCULAR | Status: AC
  Administered 2024-07-23: 80 mg via INTRAVENOUS
  Filled 2024-07-23: qty 8

## 2024-07-23 MED ORDER — FUROSEMIDE 10 MG/ML IJ SOLN: 80.0000 mg | Freq: Two times a day (BID) | INTRAMUSCULAR | Status: DC

## 2024-07-23 NOTE — TOC Progression Note (Signed)
 Transition of Care [facility]) - Progression Note    Patient Details  Name: Jesse Key MRN: [id number] Date of Birth: 1966/02/26  Transition of [facility] [place]) CM/SW Contact  [name] Phone Number: [phone] 07/23/2024, 8:51 AM  Clinical Narrative:   Per caralee review, patient is not medically stable for dc.  ICM will continue to follow.   HF CSW/CM will continue to follow and monitor for dc readiness; while HF team following.     Expected Discharge Plan: Homeless Shelter Barriers to Discharge: Homeless with medical needs               Expected Discharge Plan and Services       Living arrangements for the past 2 months: Homeless                                       Social Drivers of Health (SDOH) Interventions SDOH Screenings   Food Insecurity: Food Insecurity Present (07/09/2024)  Housing: High Risk (07/09/2024)  Transportation Needs: Unmet Transportation Needs (07/09/2024)  Utilities: At Risk (07/09/2024)  Depression (PHQ2-9): Medium Risk (06/04/2024)  Financial Resource Strain: High Risk (04/12/2024)  Stress: Stress Concern Present (06/04/2024)  Tobacco Use: High Risk (07/08/2024)  Health Literacy: Adequate Health Literacy (01/03/2024)    Readmission Risk Interventions    06/24/2024    3:25 PM  Readmission Risk Prevention Plan  Transportation Screening Complete  Home Care Screening Complete  Medication Review (RN CM) Complete

## 2024-07-23 NOTE — TOC Progression Note (Signed)
 Transition of Care [facility]) - Progression Note    Patient Details  Name: BUSTER SCHUELLER MRN: [id number] Date of Birth: [birth year]  [facility]) CM/SW Contact  Marval Gell, RN Phone Number: 07/23/2024, 4:05 PM  Clinical Narrative:      SDOH intervention/ resources added to AVS.   Expected Discharge Plan: Homeless Shelter Barriers to Discharge: Homeless with medical needs               Expected Discharge Plan and Services       Living arrangements for the past 2 months: Homeless                                       Social Drivers of Health (SDOH) Interventions SDOH Screenings   Food Insecurity: Food Insecurity Present (07/09/2024)  Housing: High Risk (07/09/2024)  Transportation Needs: Unmet Transportation Needs (07/09/2024)  Utilities: At Risk (07/09/2024)  Depression (PHQ2-9): Medium Risk (06/04/2024)  Financial Resource Strain: High Risk (04/12/2024)  Stress: Stress Concern Present (06/04/2024)  Tobacco Use: High Risk (07/08/2024)  Health Literacy: Adequate Health Literacy (01/03/2024)    Readmission Risk Interventions    06/24/2024    3:25 PM  Readmission Risk Prevention Plan  Transportation Screening Complete  Home Care Screening Complete  Medication Review (RN CM) Complete

## 2024-07-23 NOTE — Progress Notes (Signed)
 Patient ID: Jesse Key, male   DOB: [birth year], 59 y.o.   MRN: [id number]     Advanced Heart Failure Rounding Note  Cardiologist: Soyla DELENA Merck, MD  AHF Cardiologist: Dr. Cherrie  Chief Complaint: Low-output HF Patient Profile   Jesse Key is a 59 y.o. male with h/o  tobacco, cocaine and alcohol use, chronic systolic heart failure and known CAD (diffuse moderate, nonobstructive dz by cath 5/25), presented with significantly elevated troponin and active chest pain concerning for NSTEMI. UDS + for cocaine.   Significant events:   1/8: R/LHC: see imaging below. Elevated filling pressures, low CI 1.62. Started on milrinone  0.25.  1/12: s/p PCI with DES to RCA c/b small dissection of pRCA s/p second DES  Subjective:    No co-ox. CVP waveform poor. Weight up 10 lbs. sCr 0.95>1.65  Lying in bed, no shortness of breath. No CP.   Objective:    Weight Range: 63.1 kg Body mass index is 22.47 kg/m.   Vital Signs:   Temp:  [97.9 F (36.6 C)-98.3 F (36.8 C)] 97.9 F (36.6 C) (01/20 0326) Pulse Rate:  [79-95] 80 (01/20 0326) Resp:  [17-20] 20 (01/20 0326) BP: (96-109)/(61-70) 99/64 (01/20 0326) SpO2:  [92 %-100 %] 92 % (01/20 0326) Weight:  [63.1 kg] 63.1 kg (01/20 0326) Last BM Date : 07/22/24  Weight change: Filed Weights   07/21/24 0417 07/22/24 0517 07/23/24 0326  Weight: 59.1 kg 59.6 kg 63.1 kg   Intake/Output:  Intake/Output Summary (Last 24 hours) at 07/23/2024 1107 Last data filed at 07/23/2024 0851 Gross per 24 hour  Intake 360 ml  Output --  Net 360 ml   Physical Exam   General: Haggard appearing. No distress  Cardiac: JVP ~8 cm. No murmurs  Abdomen: Soft, distended.  Extremities: Warm and dry.  No edema.  Neuro: A&O x3. Affect pleasant.   Telemetry   SR 80-90s (personally reviewed)  Labs   CBC Recent Labs    07/21/24 0500  WBC 7.5  HGB 13.2  HCT 42.1  MCV 75.9*  PLT 256   Basic Metabolic Panel Recent Labs    98/80/73 0625  07/23/24 0344  NA 142 137  K 3.2* 4.4  CL 115* 103  CO2 19* 24  GLUCOSE 75 113*  BUN 18 28*  CREATININE 0.95 1.65*  CALCIUM  6.5* 8.8*  MG  --  2.2   Liver Function Tests No results for input(s): AST, ALT, ALKPHOS, BILITOT, PROT, ALBUMIN in the last 72 hours.  BNP (last 3 results) Recent Labs    04/01/24 1327 04/12/24 1014 05/21/24 1004  BNP >4,500.0* 3,217.5* 3,013.7*   ProBNP (last 3 results) Recent Labs    06/23/24 2241 06/30/24 1232 07/08/24 2224  PROBNP 11,603.0* 3,740.0* 8,698.0*   Fasting Lipid Panel No results for input(s): CHOL, HDL, LDLCALC, TRIG, CHOLHDL, LDLDIRECT in the last 72 hours.  Cardiac Imaging:    LHC 07/12/23: Dominance: Right   Medications:    Scheduled Medications:  aspirin  EC  81 mg Oral Daily   Chlorhexidine  Gluconate Cloth  6 each Topical Daily   clopidogrel   75 mg Oral Q breakfast   digoxin   0.0625 mg Oral Daily   docusate sodium   100 mg Oral BID   empagliflozin   10 mg Oral QAC breakfast   enoxaparin  (LOVENOX ) injection  40 mg Subcutaneous Q24H   folic acid   1 mg Oral Daily   multivitamin with minerals  1 tablet Oral Daily   rosuvastatin   20 mg Oral  Daily   sodium chloride  flush  10-40 mL Intracatheter Q12H   sodium chloride  flush  3 mL Intravenous Q12H   sodium chloride  flush  3 mL Intravenous Q12H   spironolactone   25 mg Oral Daily   thiamine   100 mg Oral Daily   torsemide   20 mg Oral Daily    Infusions:    PRN Medications: acetaminophen , levalbuterol , LORazepam , ondansetron  (ZOFRAN ) IV, sodium chloride  flush, sodium chloride  flush  Assessment/Plan   NSTEMI, CAD - CP on admit. Hs-trop 1,513>1,613>2,107. Cocaine +  - Nonobstructive CAD on 7/25 cath. Repeat this admission with 80% RCA (possible culprit). Possible that elevated troponins, were troponin leak related to low output heart failure and cocaine use. - S/p PCI with DES to RCA c/b dissection of pRCA treated with second DES - no s/s  angina - continue ASA/plavix , DAPT 28yr - continue statin  Acute on chronic systolic CHF: Nonischemic cardiomyopathy. Echo in 5/25 showed EF < 20%, RV nl, global HK, mid-mod MR. R/LHC in 5/25 showed non-obstructive CAD, well-compensated filling pressures with CI 2.07.  CMR in 5/25 showed LV EF 18%, RV EF 35%, nonspecific RV insertion site LGE.  Echo 9/25: EF 15-20%, RV mildly reduced, RVSP 55 mmHg, severe MR, severe TR, dilated IVC.  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 Echo 12/25: EF <20%, LV with GHK, RV normal, LA mod dilated, mild MR. Significant RCA disease on cath, but this does not explain his long-standing cardiomyopathy. RHC 1/8 markedly elevated right and left heart filling pressures with low CI (1.5 F/1.6 T) and PAPi 1.2. Started on milrinone . Underwent staged PCI to RCA as above.  - Now off Milrinone . Check co-ox with AKI and weight gain - Volume status looks good. Continue Torsemide  20 mg daily - Continue digoxin  0.0625 mg daily. Lvl ok.  - Continue jardiance  10 mg daily - Continue spironolactone  25 mg daily.  - BP too low to restart entresto  - No beta blocker with low output.  - Poor candidate for advanced therapies with social situation. Compliance had been improved with paramedicine, until lost job and stable housing. He is now unhoused. SW has been working to get him into housing - EF remains <35% with LBBB-like IVCD. Ideally would get CRT-D, given low output heart failure and lack of candidacy at this time for VAD. Have d/w EP - has outpatient appointment scheduled.   LBBB - QRS >150s on ECG and tele - plan as above   CKD stage 3b: Baseline had been ~1.5-1.6. - cardiorenal syndrome.  - on Jardiance    Cocaine use Tobacco use ETOH use - Recently drank ETOH again because he is stressed by social situation - UDS + cocaine and opiates - Smokes ~3 cigarettes a day - Cessation of all advised   Rectal Bleeding - BRBPR x 1 on 1/16  likely d/t  hemorrhoids vs diverticular (noted on recent colonoscopy 12/25) - s/p resection of polyp (tubular adenoma) 12/25 - monitor H/H closely  - resolved - continue DAPT given fresh RCA stent   SDOH:  - poor health literacy - Enrolled in paramedicine, compliance much improved - Recently denied disability - homeless  He has received notification that he has been approved for a bed in a homeless shelter but now not available until Thursday.   Elliana Bal, NP 07/23/24, 11:07 AM  Advanced Heart Failure Team Pager [phone] (M-F; 7a - 5p)    Please visit [email or web address]: For overnight coverage please call cardiology fellow first. If fellow not  available call Shock/ECMO MD on call.  For ECMO / Mechanical Support (Impella, IABP, LVAD) issues call Shock / ECMO MD on call.

## 2024-07-23 NOTE — Plan of Care (Signed)
  Problem: Activity: Goal: Risk for activity intolerance will decrease Outcome: Progressing   Problem: Nutrition: Goal: Adequate nutrition will be maintained Outcome: Progressing   Problem: Coping: Goal: Level of anxiety will decrease Outcome: Progressing   Problem: Elimination: Goal: Will not experience complications related to bowel motility Outcome: Progressing Goal: Will not experience complications related to urinary retention Outcome: Progressing   Problem: Pain Managment: Goal: General experience of comfort will improve and/or be controlled Outcome: Progressing   Problem: Safety: Goal: Ability to remain free from injury will improve Outcome: Progressing   Problem: Skin Integrity: Goal: Risk for impaired skin integrity will decrease Outcome: Progressing   Problem: Activity: Goal: Ability to return to baseline activity level will improve Outcome: Progressing

## 2024-07-23 NOTE — Plan of Care (Signed)
" °  Problem: Clinical Measurements: Goal: Diagnostic test results will improve Outcome: Progressing Goal: Respiratory complications will improve Outcome: Progressing Goal: Cardiovascular complication will be avoided Outcome: Progressing   Problem: Nutrition: Goal: Adequate nutrition will be maintained Outcome: Progressing   Problem: Coping: Goal: Level of anxiety will decrease Outcome: Progressing   Problem: Pain Managment: Goal: General experience of comfort will improve and/or be controlled Outcome: Progressing   Problem: Safety: Goal: Ability to remain free from injury will improve Outcome: Progressing   Problem: Health Behavior/Discharge Planning: Goal: Ability to safely manage health-related needs after discharge will improve Outcome: Progressing   Problem: Activity: Goal: Ability to return to baseline activity level will improve Outcome: Progressing   Problem: Activity: Goal: Capacity to carry out activities will improve Outcome: Progressing   "

## 2024-07-24 ENCOUNTER — Telehealth (HOSPITAL_COMMUNITY): Payer: Self-pay

## 2024-07-24 ENCOUNTER — Other Ambulatory Visit (HOSPITAL_COMMUNITY): Payer: Self-pay

## 2024-07-24 ENCOUNTER — Ambulatory Visit (HOSPITAL_COMMUNITY)

## 2024-07-24 LAB — BASIC METABOLIC PANEL WITH GFR
Anion gap: 11 (ref 5–15)
BUN: 30 mg/dL — ABNORMAL HIGH (ref 6–20)
CO2: 26 mmol/L (ref 22–32)
Calcium: 8.9 mg/dL (ref 8.9–10.3)
Chloride: 102 mmol/L (ref 98–111)
Creatinine, Ser: 1.51 mg/dL — ABNORMAL HIGH (ref 0.61–1.24)
GFR, Estimated: 53 mL/min — ABNORMAL LOW
Glucose, Bld: 89 mg/dL (ref 70–99)
Potassium: 4.3 mmol/L (ref 3.5–5.1)
Sodium: 139 mmol/L (ref 135–145)

## 2024-07-24 MED ORDER — TORSEMIDE 20 MG PO TABS: 20.0000 mg | ORAL_TABLET | Freq: Every day | ORAL | Status: DC

## 2024-07-24 MED ORDER — TORSEMIDE 20 MG PO TABS
20.0000 mg | ORAL_TABLET | Freq: Every day | ORAL | 5 refills | Status: DC
  Filled 2024-07-24: qty 30, 30d supply, fill #0

## 2024-07-24 MED ORDER — SPIRONOLACTONE 25 MG PO TABS
25.0000 mg | ORAL_TABLET | Freq: Every day | ORAL | 5 refills | Status: DC
  Filled 2024-07-24: qty 30, 30d supply, fill #0

## 2024-07-24 MED ORDER — THIAMINE HCL 100 MG PO TABS
100.0000 mg | ORAL_TABLET | Freq: Every day | ORAL | 3 refills | Status: DC
  Filled 2024-07-24: qty 30, 30d supply, fill #0

## 2024-07-24 MED ORDER — ASPIRIN 81 MG PO TBEC
81.0000 mg | DELAYED_RELEASE_TABLET | Freq: Every day | ORAL | 12 refills | Status: AC
  Filled 2024-07-24: qty 30, 30d supply, fill #0

## 2024-07-24 MED ORDER — FOLIC ACID 1 MG PO TABS
1.0000 mg | ORAL_TABLET | Freq: Every day | ORAL | 5 refills | Status: DC
  Filled 2024-07-24: qty 30, 30d supply, fill #0

## 2024-07-24 MED ORDER — ADULT MULTIVITAMIN W/MINERALS CH
1.0000 | ORAL_TABLET | Freq: Every day | ORAL | 5 refills | Status: AC
  Filled 2024-07-24: qty 30, 30d supply, fill #0

## 2024-07-24 MED ORDER — CLOPIDOGREL BISULFATE 75 MG PO TABS
75.0000 mg | ORAL_TABLET | Freq: Every day | ORAL | 11 refills | Status: DC
  Filled 2024-07-24: qty 30, 30d supply, fill #0

## 2024-07-24 MED ORDER — DIGOXIN 62.5 MCG PO TABS
62.5000 ug | ORAL_TABLET | Freq: Every day | ORAL | 5 refills | Status: AC
  Filled 2024-07-24: qty 30, 30d supply, fill #0

## 2024-07-24 NOTE — Telephone Encounter (Signed)
Called patient to see if he is interested in the Cardiac Rehab Program. Patient expressed interest. Explained scheduling process and went over insurance process, patient verbalized understanding. Will contact patient for scheduling once f/u has been completed. 

## 2024-07-24 NOTE — Plan of Care (Signed)

## 2024-07-24 NOTE — Plan of Care (Signed)
 " Problem: Education: Goal: Knowledge of General Education information will improve Description: Including pain rating scale, medication(s)/side effects and non-pharmacologic comfort measures Outcome: Adequate for Discharge   Problem: Health Behavior/Discharge Planning: Goal: Ability to manage health-related needs will improve Outcome: Adequate for Discharge   Problem: Clinical Measurements: Goal: Ability to maintain clinical measurements within normal limits will improve Outcome: Adequate for Discharge Goal: Will remain free from infection Outcome: Adequate for Discharge Goal: Diagnostic test results will improve Outcome: Adequate for Discharge Goal: Respiratory complications will improve Outcome: Adequate for Discharge Goal: Cardiovascular complication will be avoided Outcome: Adequate for Discharge   Problem: Activity: Goal: Risk for activity intolerance will decrease 07/24/2024 1433 by Charlann Lucie CROME, RN Outcome: Adequate for Discharge 07/24/2024 1104 by Charlann Lucie CROME, RN Outcome: Progressing   Problem: Nutrition: Goal: Adequate nutrition will be maintained 07/24/2024 1433 by Charlann Lucie CROME, RN Outcome: Adequate for Discharge 07/24/2024 1104 by Charlann Lucie CROME, RN Outcome: Progressing   Problem: Coping: Goal: Level of anxiety will decrease 07/24/2024 1433 by Charlann Lucie CROME, RN Outcome: Adequate for Discharge 07/24/2024 1104 by Charlann Lucie CROME, RN Outcome: Progressing   Problem: Elimination: Goal: Will not experience complications related to bowel motility 07/24/2024 1433 by Charlann Lucie CROME, RN Outcome: Adequate for Discharge 07/24/2024 1104 by Charlann Lucie CROME, RN Outcome: Progressing Goal: Will not experience complications related to urinary retention 07/24/2024 1433 by Charlann Lucie CROME, RN Outcome: Adequate for Discharge 07/24/2024 1104 by Charlann Lucie CROME, RN Outcome: Progressing   Problem: Pain Managment: Goal: General experience of comfort  will improve and/or be controlled 07/24/2024 1433 by Charlann Lucie CROME, RN Outcome: Adequate for Discharge 07/24/2024 1104 by Charlann Lucie CROME, RN Outcome: Progressing   Problem: Safety: Goal: Ability to remain free from injury will improve 07/24/2024 1433 by Charlann Lucie CROME, RN Outcome: Adequate for Discharge 07/24/2024 1104 by Charlann Lucie CROME, RN Outcome: Progressing   Problem: Skin Integrity: Goal: Risk for impaired skin integrity will decrease 07/24/2024 1433 by Charlann Lucie CROME, RN Outcome: Adequate for Discharge 07/24/2024 1104 by Charlann Lucie CROME, RN Outcome: Progressing   Problem: Education: Goal: Understanding of CV disease, CV risk reduction, and recovery process will improve Outcome: Adequate for Discharge Goal: Individualized Educational Video(s) Outcome: Adequate for Discharge   Problem: Activity: Goal: Ability to return to baseline activity level will improve Outcome: Adequate for Discharge   Problem: Cardiovascular: Goal: Ability to achieve and maintain adequate cardiovascular perfusion will improve Outcome: Adequate for Discharge Goal: Vascular access site(s) Level 0-1 will be maintained Outcome: Adequate for Discharge   Problem: Health Behavior/Discharge Planning: Goal: Ability to safely manage health-related needs after discharge will improve Outcome: Adequate for Discharge   Problem: Education: Goal: Understanding of CV disease, CV risk reduction, and recovery process will improve Outcome: Adequate for Discharge Goal: Individualized Educational Video(s) Outcome: Adequate for Discharge   Problem: Activity: Goal: Ability to return to baseline activity level will improve Outcome: Adequate for Discharge   Problem: Cardiovascular: Goal: Ability to achieve and maintain adequate cardiovascular perfusion will improve Outcome: Adequate for Discharge Goal: Vascular access site(s) Level 0-1 will be maintained Outcome: Adequate for Discharge   Problem:  Health Behavior/Discharge Planning: Goal: Ability to safely manage health-related needs after discharge will improve Outcome: Adequate for Discharge   Problem: Education: Goal: Ability to demonstrate management of disease process will improve Outcome: Adequate for Discharge Goal: Ability to verbalize understanding of medication therapies will improve Outcome: Adequate for Discharge Goal: Individualized Educational Video(s) Outcome: Adequate for Discharge  Problem: Activity: Goal: Capacity to carry out activities will improve Outcome: Adequate for Discharge   Problem: Cardiac: Goal: Ability to achieve and maintain adequate cardiopulmonary perfusion will improve Outcome: Adequate for Discharge   "

## 2024-07-25 ENCOUNTER — Telehealth: Payer: Self-pay | Admitting: *Deleted

## 2024-07-25 ENCOUNTER — Other Ambulatory Visit (HOSPITAL_COMMUNITY): Payer: Self-pay | Admitting: Emergency Medicine

## 2024-07-25 NOTE — Transitions of Care (Post Inpatient/ED Visit) (Signed)
" ° °  07/25/2024  Name: Jesse Key MRN: [id number] DOB: [birth year]  Today's TOC FU Call Status: Today's TOC FU Call Status:: Unsuccessful Call (1st Attempt) Unsuccessful Call (1st Attempt) Date: 07/25/24  Attempted to reach the patient regarding the most recent Inpatient/ED visit.  Follow Up Plan: Additional outreach attempts will be made to reach the patient to complete the Transitions of Care (Post Inpatient/ED visit) call.   Andrea Dimes RN, BSN [place]  [facility] Health RN Care Manager [phone]  "

## 2024-07-25 NOTE — Progress Notes (Signed)
 Paramedicine Encounter    Patient ID: Jesse Key, male    DOB: 12/19/1965, 59 y.o.   MRN: [id number]   Complaints NONE  Assessment A&O x 4, skin W&D w/ good color. Denies chest pain or SOB.  Lung sounds clear and equal bilat. No peripheral edema noted.   Compliance with meds Just got out of the hospital yesterday.  Pill box filled x 1 week  Refills needed NONE  Meds changes since last visit     Social changes Stopped Furosemide  40mg , Stopped Metoprolol  25mg , Stopped Entresto  24-26 Started: ASA 81mg , CentraVite, Clopidogrel , Folic Acid , Thiamine , Torsemide .    BP 120/70 (BP Location: Right Arm, Patient Position: Sitting, Cuff Size: Normal)   Pulse 76   SpO2 96%  Weight yesterday- Last visit weight-  It was great to see Jesse Key today.  He was A&O x 4, skin W&D w/ good color.  He denies chest pain or SOB.  I was able to meet him today at the [facility] where he will be staying for at least the next 90 days.  He states he is feeling much better and is grateful for how much Dr. Cherrie and Amy Lenetta have done for me.   Med box reconciled per discharge summary.  Discussed with him the importance of being compliant w/ his meds.  We discussed discussed illegal drug use and I told him that future drug use could be a fatal decision.  He states he understands and feels like he has been given another chance and he doesn't want to mess up.   Reviewed with him pending appointments. I will attend his follow up visit @ [facility]  1/28 @ 1:30.  ACTION: Home visit completed  Mary Claudene Kennel [phone] 07/25/24  Patient Care Team: Celestia Rosaline SQUIBB, NP as PCP - General (Internal Medicine) Bensimhon, Toribio SAUNDERS, MD as PCP - Advanced Heart Failure (Cardiology) Loni Soyla LABOR, MD as PCP - Cardiology (Cardiology) Patient, No Pcp Per (General Practice) Frances Ozell RAMAN, LCSW as [facility] Care Management (Licensed Clinical Social Worker)  Patient Active Problem List    Diagnosis Date Noted   Elevated liver transaminase level 07/09/2024   Bleeding internal hemorrhoids 07/02/2024   Rectal bleeding 06/30/2024   Asthma, chronic 06/26/2024   AKI (acute kidney injury) 06/25/2024   Lactic acidosis 06/25/2024   CHF (congestive heart failure) (HCC) 06/24/2024   Nonischemic cardiomyopathy (HCC) 06/24/2024   Demand ischemia (HCC) 06/24/2024   Acute heart failure with reduced ejection fraction (HFrEF) (HCC) 06/24/2024   Acute gout 04/04/2024   Chest pain 04/02/2024   Chronic kidney disease, stage 3a (HCC) 04/02/2024   Hyperkalemia 04/02/2024   CAD (coronary artery disease) 04/02/2024   Acute on chronic systolic CHF (congestive heart failure) (HCC) 04/02/2024   Shortness of breath 11/25/2023   Essential hypertension 11/24/2023   Heart failure (HCC) 11/23/2023   ETOH abuse 11/23/2023   Elevated troponin 11/23/2023   Leukocytosis 11/23/2023   Acute pulmonary edema (HCC) 11/23/2023   Current Medications[1] Allergies[2]   Social History   Socioeconomic History   Marital status: Single    Spouse name: Not on file   Number of children: Not on file   Years of education: Not on file   Highest education level: Not on file  Occupational History   Not on file  Tobacco Use   Smoking status: Some Days    Current packs/day: 0.25    Types: Cigarettes   Smokeless tobacco: Never  Vaping Use   Vaping status:  Never Used  Substance and Sexual Activity   Alcohol use: Yes    Comment: weekends only   Drug use: No   Sexual activity: Not on file  Other Topics Concern   Not on file  Social History Narrative   ** Merged History Encounter **       Homeless at the time may smoke one cigarette a day   Social Drivers of Health   Tobacco Use: High Risk (07/08/2024)   Patient History    Smoking Tobacco Use: Some Days    Smokeless Tobacco Use: Never    Passive Exposure: Not on file  Financial Resource Strain: High Risk (04/12/2024)   Overall Financial Resource  Strain (CARDIA)    Difficulty of Paying Living Expenses: Hard  Food Insecurity: Food Insecurity Present (07/09/2024)   Epic    Worried About Programme Researcher, Broadcasting/film/video in the Last Year: Often true    Ran Out of Food in the Last Year: Often true  Transportation Needs: Unmet Transportation Needs (07/09/2024)   Epic    Lack of Transportation (Medical): Yes    Lack of Transportation (Non-Medical): Yes  Physical Activity: Not on file  Stress: Stress Concern Present (06/04/2024)   [employer] of Occupational Health - Occupational Stress Questionnaire    Feeling of Stress: To some extent  Social Connections: Not on file  Intimate Partner Violence: Not At Risk (07/09/2024)   Epic    Fear of Current or Ex-Partner: No    Emotionally Abused: No    Physically Abused: No    Sexually Abused: No  Depression (PHQ2-9): Medium Risk (06/04/2024)   Depression (PHQ2-9)    PHQ-2 Score: 9  Alcohol Screen: Not on file  Housing: High Risk (07/09/2024)   Epic    Unable to Pay for Housing in the Last Year: Yes    Number of Times Moved in the Last Year: 4    Homeless in the Last Year: Yes  Utilities: At Risk (07/09/2024)   Epic    Threatened with loss of utilities: Yes  Health Literacy: Adequate Health Literacy (01/03/2024)   B1300 Health Literacy    Frequency of need for help with medical instructions: Rarely    Physical Exam      Future Appointments  Date Time Provider Department Center  07/26/2024 11:30 AM Jorja Nichole LABOR, RN [facility] None  07/26/2024  2:00 PM Munoz-Lopez, German [facility] None  07/31/2024  1:30 PM [facility] PA/NP [facility] None  08/14/2024  3:00 PM Inocencio Soyla Lunger, MD [facility]  08/15/2024  2:30 PM Celestia Rosaline SQUIBB, NP [facility] [place]  08/19/2024  2:00 PM Bensimhon, Toribio SAUNDERS, MD [facility] None          [1]  Current Outpatient Medications:    albuterol  (VENTOLIN  HFA) 108 (90 Base) MCG/ACT inhaler, Inhale 1-2 puffs into the lungs every 6 (six) hours as needed for  wheezing or shortness of breath., Disp: 6.7 g, Rfl: 0   aspirin  EC 81 MG tablet, Take 1 tablet (81 mg total) by mouth daily. Swallow whole., Disp: 30 tablet, Rfl: 12   clopidogrel  (PLAVIX ) 75 MG tablet, Take 1 tablet (75 mg total) by mouth daily with breakfast., Disp: 30 tablet, Rfl: 11   Digoxin  62.5 MCG TABS, Take 62.5 mcg (1 tablet) by mouth daily., Disp: 30 tablet, Rfl: 5   empagliflozin  (JARDIANCE ) 10 MG TABS tablet, Take 1 tablet (10 mg total) by mouth daily before breakfast., Disp: 30 tablet, Rfl: 0   folic acid  (FOLVITE ) 1  MG tablet, Take 1 tablet (1 mg total) by mouth daily., Disp: 30 tablet, Rfl: 5   Multiple Vitamin (MULTIVITAMIN WITH MINERALS) TABS tablet, Take 1 tablet by mouth daily., Disp: 30 tablet, Rfl: 5   rosuvastatin  (CRESTOR ) 20 MG tablet, Take 1 tablet (20 mg total) by mouth daily., Disp: 30 tablet, Rfl: 0   spironolactone  (ALDACTONE ) 25 MG tablet, Take 1 tablet (25 mg total) by mouth daily., Disp: 30 tablet, Rfl: 5   thiamine  (VITAMIN B1) 100 MG tablet, Take 1 tablet (100 mg total) by mouth daily., Disp: 30 tablet, Rfl: 3   torsemide  (DEMADEX ) 20 MG tablet, Take 1 tablet (20 mg total) by mouth daily., Disp: 30 tablet, Rfl: 5   nicotine  (NICODERM CQ  - DOSED IN MG/24 HOURS) 14 mg/24hr patch, Place 1 patch (14 mg total) onto the skin daily. (Patient not taking: Reported on 07/25/2024), Disp: , Rfl:  [2]  Allergies Allergen Reactions   Shrimp [Shellfish Allergy] Anaphylaxis and Itching    ALL SHELLFISH   Shellfish Allergy Itching

## 2024-07-26 ENCOUNTER — Other Ambulatory Visit: Payer: Self-pay | Admitting: *Deleted

## 2024-07-26 ENCOUNTER — Other Ambulatory Visit: Payer: Self-pay

## 2024-07-26 NOTE — Patient Outreach (Addendum)
 BSW began call but patient was having issues hearing BSW. Patient hung up call and BSW made multiple attempts to reestablish communication with the patient but was unable to do so. BSW will continue outreach efforts.

## 2024-07-26 NOTE — Patient Instructions (Signed)
 Visit Information  Mr. Whicker was given information about Medicaid Managed Care team care coordination services as a part of their [facility] Community Plan Medicaid benefit.   If you would like to schedule transportation through your [facility], please call the following number at least 2 days in advance of your appointment: [phone]   Rides for urgent appointments can also be made after hours by calling Member Services.  Call the Behavioral Health Crisis Line at [phone], at any time, 24 hours a day, 7 days a week. If you are in danger or need immediate medical attention call 911.   Care plan and visit instructions communicated with the patient verbally today. Patient agrees to receive a copy in MyChart. Active MyChart status and patient understanding of how to access instructions and care plan via MyChart confirmed with patient.     Telephone follow up appointment with Managed Medicaid care management team member scheduled for:  Laymon Doll, [place]/VBCI - [facility] Social Worker [phone]   Following is a copy of your plan of care:   Goals Addressed   None

## 2024-07-26 NOTE — Patient Instructions (Signed)
 Vinie JINNY Pyo - I am sorry I was unable to reach you today for our scheduled appointment. I work with Celestia Rosaline SQUIBB, NP and am calling to support your healthcare needs. Please contact me at [phone] at your earliest convenience. I look forward to speaking with you soon.   Thank you,  Maximillian Habibi, RN, BSN, ACM RN Care Manager [employer] [phone]

## 2024-07-30 ENCOUNTER — Telehealth (HOSPITAL_COMMUNITY): Payer: Self-pay

## 2024-07-30 NOTE — Telephone Encounter (Signed)
 Called to confirm/remind patient of their appointment at the [facility] on 07/31/24 1:30.   Appointment:   [] Confirmed  [x] Left mess   [] No answer/No voice mail  [] VM Full/unable to leave message  [] Phone not in service  Patient reminded to bring all medications and/or complete list.  Confirmed patient has transportation. Gave directions, instructed to utilize valet parking.

## 2024-07-31 ENCOUNTER — Encounter (HOSPITAL_COMMUNITY): Payer: Self-pay

## 2024-07-31 ENCOUNTER — Other Ambulatory Visit (HOSPITAL_COMMUNITY): Payer: Self-pay

## 2024-07-31 ENCOUNTER — Other Ambulatory Visit: Payer: Self-pay

## 2024-07-31 ENCOUNTER — Ambulatory Visit (HOSPITAL_COMMUNITY)
Admit: 2024-07-31 | Discharge: 2024-07-31 | Disposition: A | Discharge status: Home / Self Care | Source: Ambulatory Visit | Attending: Cardiology | Admitting: Cardiology

## 2024-07-31 ENCOUNTER — Telehealth: Payer: Self-pay

## 2024-07-31 ENCOUNTER — Telehealth (HOSPITAL_COMMUNITY): Payer: Self-pay | Admitting: Emergency Medicine

## 2024-07-31 ENCOUNTER — Ambulatory Visit (HOSPITAL_COMMUNITY): Payer: Self-pay | Admitting: Cardiology

## 2024-07-31 VITALS — BP 114/64 | HR 99 | Wt 136.8 lb

## 2024-07-31 DIAGNOSIS — I428 Other cardiomyopathies: Secondary | ICD-10-CM | POA: Diagnosis not present

## 2024-07-31 DIAGNOSIS — F1411 Cocaine abuse, in remission: Secondary | ICD-10-CM | POA: Diagnosis not present

## 2024-07-31 DIAGNOSIS — N1832 Chronic kidney disease, stage 3b: Secondary | ICD-10-CM | POA: Diagnosis not present

## 2024-07-31 DIAGNOSIS — Z7902 Long term (current) use of antithrombotics/antiplatelets: Secondary | ICD-10-CM | POA: Diagnosis not present

## 2024-07-31 DIAGNOSIS — I5082 Biventricular heart failure: Secondary | ICD-10-CM | POA: Insufficient documentation

## 2024-07-31 DIAGNOSIS — I2510 Atherosclerotic heart disease of native coronary artery without angina pectoris: Secondary | ICD-10-CM | POA: Insufficient documentation

## 2024-07-31 DIAGNOSIS — Z5901 Sheltered homelessness: Secondary | ICD-10-CM | POA: Insufficient documentation

## 2024-07-31 DIAGNOSIS — F1091 Alcohol use, unspecified, in remission: Secondary | ICD-10-CM | POA: Diagnosis not present

## 2024-07-31 DIAGNOSIS — I447 Left bundle-branch block, unspecified: Secondary | ICD-10-CM | POA: Insufficient documentation

## 2024-07-31 DIAGNOSIS — Z7984 Long term (current) use of oral hypoglycemic drugs: Secondary | ICD-10-CM | POA: Diagnosis not present

## 2024-07-31 DIAGNOSIS — I252 Old myocardial infarction: Secondary | ICD-10-CM | POA: Diagnosis not present

## 2024-07-31 DIAGNOSIS — Z7982 Long term (current) use of aspirin: Secondary | ICD-10-CM | POA: Insufficient documentation

## 2024-07-31 DIAGNOSIS — F17210 Nicotine dependence, cigarettes, uncomplicated: Secondary | ICD-10-CM | POA: Diagnosis not present

## 2024-07-31 DIAGNOSIS — I509 Heart failure, unspecified: Secondary | ICD-10-CM

## 2024-07-31 DIAGNOSIS — I5022 Chronic systolic (congestive) heart failure: Secondary | ICD-10-CM | POA: Diagnosis not present

## 2024-07-31 LAB — BASIC METABOLIC PANEL WITH GFR
Anion gap: 13 (ref 5–15)
BUN: 26 mg/dL — ABNORMAL HIGH (ref 6–20)
CO2: 26 mmol/L (ref 22–32)
Calcium: 6.9 mg/dL — ABNORMAL LOW (ref 8.9–10.3)
Chloride: 103 mmol/L (ref 98–111)
Creatinine, Ser: 1.77 mg/dL — ABNORMAL HIGH (ref 0.61–1.24)
GFR, Estimated: 43 mL/min — ABNORMAL LOW
Glucose, Bld: 100 mg/dL — ABNORMAL HIGH (ref 70–99)
Potassium: 4.5 mmol/L (ref 3.5–5.1)
Sodium: 142 mmol/L (ref 135–145)

## 2024-07-31 LAB — CBC
HCT: 41 % (ref 39.0–52.0)
Hemoglobin: 13.1 g/dL (ref 13.0–17.0)
MCH: 23.7 pg — ABNORMAL LOW (ref 26.0–34.0)
MCHC: 32 g/dL (ref 30.0–36.0)
MCV: 74.1 fL — ABNORMAL LOW (ref 80.0–100.0)
Platelets: 237 10*3/uL (ref 150–400)
RBC: 5.53 MIL/uL (ref 4.22–5.81)
RDW: 14.7 % (ref 11.5–15.5)
WBC: 11.3 10*3/uL — ABNORMAL HIGH (ref 4.0–10.5)
nRBC: 0 % (ref 0.0–0.2)

## 2024-07-31 LAB — DIGOXIN LEVEL: Digoxin Level: <0.6 ng/mL — ABNORMAL LOW (ref 0.8–2.0)

## 2024-07-31 NOTE — Progress Notes (Signed)
"   ReDS Vest / Clip - 07/31/24 1315       ReDS Vest / Clip   Station Marker C    Ruler Value 35    ReDS Value Range Low volume    ReDS Actual Value 33          "

## 2024-07-31 NOTE — Patient Instructions (Signed)
 Medication Changes:  No Changes In Medications at this time.   Lab Work: Labs done today, your results will be available in MyChart, we will contact you for abnormal readings.  Follow-Up in: as scheduled with Dr. Bensimhon   At the [facility], you and your health needs are our priority. We have a designated team specialized in the treatment of Heart Failure. This Care Team includes your primary Heart Failure Specialized Cardiologist (physician), Advanced Practice Providers (APPs- Physician Assistants and Nurse Practitioners), and Pharmacist who all work together to provide you with the care you need, when you need it.   You may see any of the following providers on your designated Care Team at your next follow up:  Dr. Toribio Fuel Dr. Ezra Shuck Dr. Odis Brownie Greig Mosses, NP Caffie Shed, [place] [facility] [place], [place] Beckey Coe, NP [name], NP Tinnie Redman, PharmD   Please be sure to bring in all your medications bottles to every appointment.   Need to Contact Us :  If you have any questions or concerns before your next appointment please send us  a message through [place] or call our office at [phone].    TO LEAVE A MESSAGE FOR THE NURSE SELECT OPTION 2, PLEASE LEAVE A MESSAGE INCLUDING: YOUR NAME DATE OF BIRTH CALL BACK NUMBER REASON FOR CALL**this is important as we prioritize the call backs  YOU WILL RECEIVE A CALL BACK THE SAME DAY AS LONG AS YOU CALL BEFORE 4:00 PM

## 2024-07-31 NOTE — Progress Notes (Signed)
 "  [facility] Note   PCP: Celestia Rosaline SQUIBB, NP PCP-Cardiologist: Soyla DELENA Merck, MD  [facility] Cardiologist: Dr. Cherrie   Chief Complaint: f/u for systolic heart failure HPI:  Jesse Key is a 59 y.o. male with h/o tobacco, cocaine and alcohol use, CKD IIIb, known CAD (diffuse moderate, nonobstructive dz by cath 5/25) and chronic systolic heart failure (Echo 12/25 EF <20%, RV nl) who presented to the ED on 07/08/24 with significantly elevated troponin and active chest pain concerning for NSTEMI. UDS + for cocaine, though he adamantly denied recent use. Hs trop 1,513>>1,613>>2,107. EKG showed chronic LBBB. He was placed on heparin  gtt, ASA and high intensity statin. Initially felt to be volume overloaded and given dose of IV Lasix  and had bump in SCr from 1.4>>1.8. Underwent R/LHC on 1/9. Coronary angiography showed 80% RCA stenosis, felt to be culprit for NSTEMI. Coronary disease, however, does not explain his cardiomyopathy.  RHC demonstrated low output and markedly elevated filling pressures, RA 18, PA 62/41 (45), PCW 31, TD CO/CI 2.7/1.62, PAPi 1.2, thus it was decided to stage intervention to the RCA after better optimization from a HF standpoint. He was loaded w/ Plavix  and started on milrinone  0.25 mcg/kg/min and diuresed further w/ IV Lasix . After diuresis, AKI improved and SCr improved to 1.3. He underwent staged PCI to the RCA on 1/12.  Had successful PCI of mid right coronary artery w/ DES. There was small dissection of proximal right coronary artery likely due to guide trauma treated with a 3.0 x 8 mm Onyx Frontier stent. LVEDP 19. He will be continued on DAPT w/ ASA + Plavix  x 1 year followed by indefinite Plavix  monotherapy.    After PCI, milrinone  was weaned off. He tolerated wean ok. Transitioned to PO diuretics and HF GDMT. Dishcarged to a homeless shelter.   He returns today for post hospital f/u. Doing better. Denies CP. No resting dyspnea, NYHA Class II. No  LEE. Reports full med compliance. Paramedicine fills is pill box. BP 114/64. No positional dizziness. He had an episode of hematochezia post d/c about 3 days ago but no further recurrence. He has known hemorrhoids noted on recent colonoscopy.   ReDs 33%  He remains at the homeless shelter. He has a bed for the next 90 days. They provide 3 meals a day as well as a bus pass to get back and forth from appointments. They are also assisting him w/ finding a job. He says he does not need to meet w/ our social worker today.   He denies any drug use.    Review of Systems: [y] = yes, [ ]  = no   General: Weight gain [ ] ; Weight loss [ ] ; Anorexia [ ] ; Fatigue [ ] ; Fever [ ] ; Chills [ ] ; Weakness [ ]   Cardiac: Chest pain/pressure [ ] ; Resting SOB [ ] ; Exertional SOB [ ] ; Orthopnea [ ] ; Pedal Edema [ ] ; Palpitations [ ] ; Syncope [ ] ; Presyncope [ ] ; Paroxysmal nocturnal dyspnea[ ]   Pulmonary: Cough [ ] ; Wheezing[ ] ; Hemoptysis[ ] ; Sputum [ ] ; Snoring [ ]   GI: Vomiting[ ] ; Dysphagia[ ] ; Melena[ ] ; Hematochezia [ ] ; Heartburn[ ] ; Abdominal pain [ ] ; Constipation [ ] ; Diarrhea [ ] ; BRBPR [ ]   GU: Hematuria[ ] ; Dysuria [ ] ; Nocturia[ ]   Vascular: Pain in legs with walking [ ] ; Pain in feet with lying flat [ ] ; Non-healing sores [ ] ; Stroke [ ] ; TIA [ ] ; Slurred speech [ ] ;  Neuro: Headaches[ ] ;  Vertigo[ ] ; Seizures[ ] ; Paresthesias[ ] ;Blurred vision [ ] ; Diplopia [ ] ; Vision changes [ ]   Ortho/Skin: Arthritis [ ] ; Joint pain [ ] ; Muscle pain [ ] ; Joint swelling [ ] ; Back Pain [ ] ; Rash [ ]   Psych: Depression[ ] ; Anxiety[ ]   Heme: Bleeding problems [ ] ; Clotting disorders [ ] ; Anemia [ ]   Endocrine: Diabetes [ ] ; Thyroid dysfunction[ ]    Past Medical History:  Diagnosis Date   Asthma    Shellfish allergy     Current Outpatient Medications  Medication Sig Dispense Refill   albuterol  (VENTOLIN  HFA) 108 (90 Base) MCG/ACT inhaler Inhale 1-2 puffs into the lungs every 6 (six) hours as needed for wheezing  or shortness of breath. 6.7 g 0   aspirin  EC 81 MG tablet Take 1 tablet (81 mg total) by mouth daily. Swallow whole. 30 tablet 12   clopidogrel  (PLAVIX ) 75 MG tablet Take 1 tablet (75 mg total) by mouth daily with breakfast. 30 tablet 11   Digoxin  62.5 MCG TABS Take 62.5 mcg (1 tablet) by mouth daily. 30 tablet 5   empagliflozin  (JARDIANCE ) 10 MG TABS tablet Take 1 tablet (10 mg total) by mouth daily before breakfast. 30 tablet 0   folic acid  (FOLVITE ) 1 MG tablet Take 1 tablet (1 mg total) by mouth daily. 30 tablet 5   Multiple Vitamin (MULTIVITAMIN WITH MINERALS) TABS tablet Take 1 tablet by mouth daily. 30 tablet 5   rosuvastatin  (CRESTOR ) 20 MG tablet Take 1 tablet (20 mg total) by mouth daily. 30 tablet 0   spironolactone  (ALDACTONE ) 25 MG tablet Take 1 tablet (25 mg total) by mouth daily. 30 tablet 5   thiamine  (VITAMIN B1) 100 MG tablet Take 1 tablet (100 mg total) by mouth daily. 30 tablet 3   torsemide  (DEMADEX ) 20 MG tablet Take 1 tablet (20 mg total) by mouth daily. 30 tablet 5   nicotine  (NICODERM CQ  - DOSED IN MG/24 HOURS) 14 mg/24hr patch Place 1 patch (14 mg total) onto the skin daily. (Patient not taking: Reported on 07/31/2024)     No current facility-administered medications for this encounter.    Allergies[1]    Social History   Socioeconomic History   Marital status: Single    Spouse name: Not on file   Number of children: Not on file   Years of education: Not on file   Highest education level: Not on file  Occupational History   Not on file  Tobacco Use   Smoking status: Some Days    Current packs/day: 0.25    Types: Cigarettes   Smokeless tobacco: Never  Vaping Use   Vaping status: Never Used  Substance and Sexual Activity   Alcohol use: Yes    Comment: weekends only   Drug use: No   Sexual activity: Not on file  Other Topics Concern   Not on file  Social History Narrative   ** Merged History Encounter **       Homeless at the time may smoke one  cigarette a day   Social Drivers of Health   Tobacco Use: High Risk (07/31/2024)   Patient History    Smoking Tobacco Use: Some Days    Smokeless Tobacco Use: Never    Passive Exposure: Not on file  Financial Resource Strain: High Risk (07/31/2024)   Overall Financial Resource Strain (CARDIA)    Difficulty of Paying Living Expenses: Very hard  Food Insecurity: No Food Insecurity (07/31/2024)   Epic    Worried About Running  Out of Food in the Last Year: Never true    Ran Out of Food in the Last Year: Never true  Recent Concern: Food Insecurity - Food Insecurity Present (07/09/2024)   Epic    Worried About Programme Researcher, Broadcasting/film/video in the Last Year: Often true    Ran Out of Food in the Last Year: Often true  Transportation Needs: No Transportation Needs (07/31/2024)   Epic    Lack of Transportation (Medical): No    Lack of Transportation (Non-Medical): No  Recent Concern: Transportation Needs - Unmet Transportation Needs (07/09/2024)   Epic    Lack of Transportation (Medical): Yes    Lack of Transportation (Non-Medical): Yes  Physical Activity: Not on file  Stress: Stress Concern Present (06/04/2024)   [employer] of Occupational Health - Occupational Stress Questionnaire    Feeling of Stress: To some extent  Social Connections: Not on file  Intimate Partner Violence: Not At Risk (07/31/2024)   Epic    Fear of Current or Ex-Partner: No    Emotionally Abused: No    Physically Abused: No    Sexually Abused: No  Depression (PHQ2-9): Medium Risk (06/04/2024)   Depression (PHQ2-9)    PHQ-2 Score: 9  Alcohol Screen: Not on file  Housing: High Risk (07/31/2024)   Epic    Unable to Pay for Housing in the Last Year: Yes    Number of Times Moved in the Last Year: 4    Homeless in the Last Year: Yes  Utilities: Not At Risk (07/31/2024)   Epic    Threatened with loss of utilities: No  Recent Concern: Utilities - At Risk (07/09/2024)   Epic    Threatened with loss of utilities: Yes  Health  Literacy: Adequate Health Literacy (01/03/2024)   B1300 Health Literacy    Frequency of need for help with medical instructions: Rarely      Family History  Problem Relation Age of Onset   Diabetes Mother    Hypertension Mother    Diabetes Father    Hypertension Father     Vitals:   07/31/24 1315  BP: 114/64  Pulse: 99  SpO2: 98%  Weight: 62.1 kg (136 lb 12.8 oz)     PHYSICAL EXAM: ReDs 33%, normal  General:  Well appearing. No respiratory difficulty HEENT: normal Neck: supple. no JVD. Carotids 2+ bilat; no bruits. No lymphadenopathy or thyromegaly appreciated. Cor: PMI nondisplaced. Regular rate & rhythm. No rubs, gallops or murmurs. Lungs: clear Abdomen: soft, nontender, nondistended. No hepatosplenomegaly. No bruits or masses. Good bowel sounds. Extremities: no cyanosis, clubbing, rash, edema Neuro: alert & oriented x 3, cranial nerves grossly intact. moves all 4 extremities w/o difficulty. Affect pleasant.  ECG: not performed    ASSESSMENT & PLAN:  Chronic Systolic CHF: Nonischemic cardiomyopathy. Echo in 5/25 showed EF < 20%, RV nl, global HK, mid-mod MR. R/LHC in 5/25 showed non-obstructive CAD, well-compensated filling pressures with CI 2.07.  CMR in 5/25 showed LV EF 18%, RV EF 35%, nonspecific RV insertion site LGE.  Echo 9/25: EF 15-20%, RV mildly reduced, RVSP 55 mmHg, severe MR, severe TR, dilated IVC.  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 Echo 12/25: EF <20%, LV with GHK, RV normal, LA mod dilated, mild MR. Significant RCA disease on cath, but this does not explain his long-standing cardiomyopathy. RHC 1/8 markedly elevated right and left heart filling pressures with low CI (1.5 F/1.6 T) and PAPi 1.2. Started on milrinone  post cath  to facilitate diuresis. Underwent staged PCI to RCA as above. Able to wean off milrinone  and onto oral GDMT. Doing well today, euvolemic on exam and by ReDs, NYHA Class II.  - Continue Jardiance  10 mg daily   - Continue Spirolactone 25 mg daily  - check BMP today, if SCr ok, will restart Entresto  at very low dose 12-13 mg bid - No ? blocker w/ recent low output - Continue digoxin  0.0625 mg daily. Check digoxin  level  - Poor candidate for advanced therapies with social situation. Relies on homeless shelter for housing. Compliance has improved paramedicine, appreciate assistance  - EF remains <35% with LBBB-like IVCD. Ideally would get CRT-D, given low output heart failure and lack of candidacy at this time for VAD. He has consultation w/ EP next month    CAD - s/p recent NSTEMI 1/26, culprit 80% RCA lesion - S/p PCI with DES to RCA c/b dissection of pRCA treated with second DES - stable w/o CP  - continue ASA/plavix , DAPT 58yr. Given recent BRBPR, will check CBC  - continue Crestor  20 mg daily    LBBB - QRS >150s on ECGs - he will f/u w/ EP to consider possible CRT-D    CKD stage 3b: Baseline had been ~1.5-1.6, likely cardiorenal syndrome - on Jardiance   - check BMP today    Cocaine use Tobacco use ETOH use - prior history, he denies any recent use  - Continue cessation of all advised     Keep scheduled f/u in 4 wks.    Caffie Shed, PA-C 07/31/24      [1]  Allergies Allergen Reactions   Shrimp [Shellfish Allergy] Anaphylaxis and Itching    ALL SHELLFISH   Shellfish Allergy Itching   "

## 2024-07-31 NOTE — Telephone Encounter (Signed)
 Jesse Key returned my call today.  I was following up from today's visit.  Paramedicine appointment scheduled for 08/01/24 @ 8:00 at the [facility] where he is currently staying.    Mary Sharps, EMT-Paramedic [phone] 07/31/2024

## 2024-07-31 NOTE — Patient Outreach (Signed)
.  Social Drivers of Health  Community Resource and Care Coordination Visit Note   07/31/2024  Name: ARTH NICASTRO MRN: [id number] DOB:May 26, 1966  Situation: Referral received for [facility] needs assessment and assistance related to Housing  Financial Strain . I obtained verbal consent from Patient.  Visit completed with Patient on the phone.   Background:   SDOH Interventions Today    Flowsheet Row Most Recent Value  SDOH Interventions   Food Insecurity Interventions Other (Comment)  [patient gets breakfast and lunch at [facility]. Pt wants to apply for FNS. Pt is aware of where to get hot meals.]  Housing Interventions Other (Comment)  [currently at [facility] for 90 days. Working with a sports coach in [facility] for housing.]  Transportation Interventions Other (Comment)  [patient is aware of medicaid transportation and will use it as needed. Pt states he has a 30 day bus pass from case manager and also gets them when he goes to doctors appt. Pt states he does not have a transportation issue at this time]  Utilities Interventions Intervention Not Indicated, Other (Comment)  [patient is in shelter.]  Financial Strain Interventions Other (Comment)  [patient is working with [employer] on appeal for disability. Patient is working with nathaniel peterson and will be getting a call from him in 2 weeks to begin appeal process.]     Assessment:   Goals Addressed             This Visit's Progress    BSW Goal       Current SDOH Barriers:  Housing barriers Dental and eye provider Financial strain  Interventions: Patient interviewed and appropriate screenings performed Referred patient to community resources  Provided patient with information about healthy blue benefits.  Discussed plans with patient for ongoing follow up and provided patient with direct contact number Advised patient to continue working with [employer] Leonor Endo to appeal disability and continue  working with [facility] case production designer, theatre/television/film for housing. Patient is currently housed in shelter for 90 days at [facility].  BSW will provide patient a list of dental and eye providers for him to reach out to.  BSW will message PCP about pt's concern re itchy feet.           Recommendation:   attend all scheduled provider appointments call for transportation assistance at least one week before appointments ask for help if you don't understand your health insurance benefits Continue to work with [facility] case production designer, theatre/television/film and [employer] on disability.  Follow Up Plan:   Telephone follow up appointment date/time:  08/14/2024 at 11Am.   Laymon Doll, BSW [place]/VBCI - [employer] Social Worker [phone]

## 2024-07-31 NOTE — Patient Instructions (Signed)
 Visit Information  Jesse Key was given information about Medicaid Managed Care team care coordination services as a part of their Healthy [facility] Medicaid benefit. Jesse Key   If you would like to schedule transportation through your Healthy [facility] plan, please call the following number at least 2 days in advance of your appointment: [phone]  For information about your ride after you set it up, call Ride Assist at [phone]. Use this number to activate a Will Call pickup, or if your transportation is late for a scheduled pickup. Use this number, too, if you need to make a change or cancel a previously scheduled reservation.  If you need transportation services right away, call [phone]. The after-hours call center is staffed 24 hours to handle ride assistance and urgent reservation requests (including discharges) 365 days a year. Urgent trips include sick visits, hospital discharge requests and life-sustaining treatment.  Call the [facility] Line at [phone], at any time, 24 hours a day, 7 days a week. If you are in danger or need immediate medical attention call 911.   Please see education materials related to dental and eye care provider list provided by mail/mychart.  Care plan and visit instructions communicated with the patient verbally today. Patient agrees to receive a copy in MyChart. Active MyChart status and patient understanding of how to access instructions and care plan via MyChart confirmed with patient.     Telephone follow up appointment with Managed Medicaid care management team member scheduled for: 08/14/2024 at 11AM.   Jesse Key, BSW [place]/VBCI - [facility] Social Worker [phone]   Following is a copy of your plan of care:   Goals Addressed             This Visit's Progress    BSW Goal       Current SDOH Barriers:  Housing barriers Dental and eye provider Financial  strain  Interventions: Patient interviewed and appropriate screenings performed Referred patient to community resources  Provided patient with information about healthy blue benefits.  Discussed plans with patient for ongoing follow up and provided patient with direct contact number Advised patient to continue working with [employer] Leonor Endo to appeal disability and continue working with [facility] case production designer, theatre/television/film for housing. Patient is currently housed in shelter for 90 days at [facility].  BSW will provide patient a list of dental and eye providers for him to reach out to.  BSW will message PCP about pt's concern re itchy feet.

## 2024-08-01 ENCOUNTER — Other Ambulatory Visit (HOSPITAL_COMMUNITY): Payer: Self-pay | Admitting: Emergency Medicine

## 2024-08-01 ENCOUNTER — Telehealth: Payer: Self-pay

## 2024-08-01 NOTE — Progress Notes (Signed)
 Paramedicine Encounter    Patient ID: Jesse Key, male    DOB: [birth year], 59 y.o.   MRN: [id number]   Complaints Some bright red blood in stool. Doctor aware- possbile hemorrhoids  Assessment A&O x 4, skin W&D w/ good color.  Denies chest pain or SOB.  Lung sounds clear bilat.  No peripheral edema noted.    Compliance with meds YES  Pill box filled x 2 weeks  Refills needed NONE  Meds changes since last visit NONE    Social changes NONE   BP 118/68 (BP Location: Right Arm, Patient Position: Sitting, Cuff Size: Normal)   Pulse 97   Resp 14   Wt 136 lb (61.7 kg)   SpO2 96%   BMI 21.95 kg/m  Weight yesterday-136lb @ doctor visit Last visit weight-   Today's visit finds Jesse Key doing well.  He denies chest pain or SOB.  He has been compliant with all his meds.  He has received a 30 day bus pass from the [place] [facility].  He advises he is also in the process of filing for disability.  He has a lead for a job at a [employer].   At this time he is very motivated to do all he can to make to be successful in his care and social needs. Med box reconciled x 2 weeks. Pending doctor visits reviewed and he is aware of same.    Mary Sharps, EMT-Paramedic [phone] 08/01/2024  ACTION: Home visit completed  Mary Sharps Kenn[place] [phone] 08/01/24  Patient Care Team: Celestia Rosaline SQUIBB, NP as PCP - General (Internal Medicine) Bensimhon, Toribio SAUNDERS, MD as PCP - Advanced Heart Failure (Cardiology) Loni Soyla LABOR, MD as PCP - Cardiology (Cardiology) Patient, No Pcp Per (General Practice) Frances, Ozell RAMAN, LCSW as [facility] Care Management (Licensed Clinical Social Worker) Sharene, German as Social Worker  Patient Active Problem List   Diagnosis Date Noted   Elevated liver transaminase level 07/09/2024   Bleeding internal hemorrhoids 07/02/2024   Rectal bleeding 06/30/2024   Asthma, chronic 06/26/2024   AKI (acute kidney injury) 06/25/2024   Lactic  acidosis 06/25/2024   CHF (congestive heart failure) (HCC) 06/24/2024   Nonischemic cardiomyopathy (HCC) 06/24/2024   Demand ischemia (HCC) 06/24/2024   Acute heart failure with reduced ejection fraction (HFrEF) (HCC) 06/24/2024   Acute gout 04/04/2024   Chest pain 04/02/2024   Chronic kidney disease, stage 3a (HCC) 04/02/2024   Hyperkalemia 04/02/2024   CAD (coronary artery disease) 04/02/2024   Acute on chronic systolic CHF (congestive heart failure) (HCC) 04/02/2024   Shortness of breath 11/25/2023   Essential hypertension 11/24/2023   Heart failure (HCC) 11/23/2023   ETOH abuse 11/23/2023   Elevated troponin 11/23/2023   Leukocytosis 11/23/2023   Acute pulmonary edema (HCC) 11/23/2023   Current Medications[1] Allergies[1]   Social History   Socioeconomic History   Marital status: Single    Spouse name: Not on file   Number of children: Not on file   Years of education: Not on file   Highest education level: Not on file  Occupational History   Not on file  Tobacco Use   Smoking status: Some Days    Current packs/day: 0.25    Types: Cigarettes   Smokeless tobacco: Never  Vaping Use   Vaping status: Never Used  Substance and Sexual Activity   Alcohol use: Yes    Comment: weekends only   Drug use: No   Sexual activity: Not on file  Other Topics  Concern   Not on file  Social History Narrative   ** Merged History Encounter **       Homeless at the time may smoke one cigarette a day   Social Drivers of Health   Tobacco Use: High Risk (07/31/2024)   Patient History    Smoking Tobacco Use: Some Days    Smokeless Tobacco Use: Never    Passive Exposure: Not on file  Financial Resource Strain: High Risk (07/31/2024)   Overall Financial Resource Strain (CARDIA)    Difficulty of Paying Living Expenses: Very hard  Food Insecurity: No Food Insecurity (07/31/2024)   Epic    Worried About Programme Researcher, Broadcasting/film/video in the Last Year: Never true    Ran Out of Food in the Last  Year: Never true  Recent Concern: Food Insecurity - Food Insecurity Present (07/09/2024)   Epic    Worried About Programme Researcher, Broadcasting/film/video in the Last Year: Often true    Ran Out of Food in the Last Year: Often true  Transportation Needs: No Transportation Needs (07/31/2024)   Epic    Lack of Transportation (Medical): No    Lack of Transportation (Non-Medical): No  Recent Concern: Transportation Needs - Unmet Transportation Needs (07/09/2024)   Epic    Lack of Transportation (Medical): Yes    Lack of Transportation (Non-Medical): Yes  Physical Activity: Not on file  Stress: Stress Concern Present (06/04/2024)   [employer] of Occupational Health - Occupational Stress Questionnaire    Feeling of Stress: To some extent  Social Connections: Not on file  Intimate Partner Violence: Not At Risk (07/31/2024)   Epic    Fear of Current or Ex-Partner: No    Emotionally Abused: No    Physically Abused: No    Sexually Abused: No  Depression (PHQ2-9): Medium Risk (06/04/2024)   Depression (PHQ2-9)    PHQ-2 Score: 9  Alcohol Screen: Not on file  Housing: High Risk (07/31/2024)   Epic    Unable to Pay for Housing in the Last Year: Yes    Number of Times Moved in the Last Year: 4    Homeless in the Last Year: Yes  Utilities: Not At Risk (07/31/2024)   Epic    Threatened with loss of utilities: No  Recent Concern: Utilities - At Risk (07/09/2024)   Epic    Threatened with loss of utilities: Yes  Health Literacy: Adequate Health Literacy (01/03/2024)   B1300 Health Literacy    Frequency of need for help with medical instructions: Rarely    Physical Exam      Future Appointments  Date Time Provider Department Center  08/14/2024 11:00 AM Munoz-Lopez, [place] [facility] None  08/14/2024  3:00 PM Inocencio Soyla Lunger, MD [facility]  08/15/2024  2:30 PM Celestia Rosaline SQUIBB, NP [facility] [place] [facility]  08/19/2024  2:00 PM Bensimhon, Toribio SAUNDERS, MD [facility] None  08/21/2024  2:00 PM McLaurin, Nichole LABOR, RN  [facility] None          [1]  Current Outpatient Medications:    albuterol  (VENTOLIN  HFA) 108 (90 Base) MCG/ACT inhaler, Inhale 1-2 puffs into the lungs every 6 (six) hours as needed for wheezing or shortness of breath., Disp: 6.7 g, Rfl: 0   aspirin  EC 81 MG tablet, Take 1 tablet (81 mg total) by mouth daily. Swallow whole., Disp: 30 tablet, Rfl: 12   clopidogrel  (PLAVIX ) 75 MG tablet, Take 1 tablet (75 mg total) by mouth daily with breakfast., Disp: 30 tablet, Rfl: 11  Digoxin  62.5 MCG TABS, Take 62.5 mcg (1 tablet) by mouth daily., Disp: 30 tablet, Rfl: 5   empagliflozin  (JARDIANCE ) 10 MG TABS tablet, Take 1 tablet (10 mg total) by mouth daily before breakfast., Disp: 30 tablet, Rfl: 0   folic acid  (FOLVITE ) 1 MG tablet, Take 1 tablet (1 mg total) by mouth daily., Disp: 30 tablet, Rfl: 5   Multiple Vitamin (MULTIVITAMIN WITH MINERALS) TABS tablet, Take 1 tablet by mouth daily., Disp: 30 tablet, Rfl: 5   rosuvastatin  (CRESTOR ) 20 MG tablet, Take 1 tablet (20 mg total) by mouth daily., Disp: 30 tablet, Rfl: 0   spironolactone  (ALDACTONE ) 25 MG tablet, Take 1 tablet (25 mg total) by mouth daily., Disp: 30 tablet, Rfl: 5   thiamine  (VITAMIN B1) 100 MG tablet, Take 1 tablet (100 mg total) by mouth daily., Disp: 30 tablet, Rfl: 3   torsemide  (DEMADEX ) 20 MG tablet, Take 1 tablet (20 mg total) by mouth daily., Disp: 30 tablet, Rfl: 5   nicotine  (NICODERM CQ  - DOSED IN MG/24 HOURS) 14 mg/24hr patch, Place 1 patch (14 mg total) onto the skin daily. (Patient not taking: Reported on 08/01/2024), Disp: , Rfl:  [1]  Allergies Allergen Reactions   Shrimp [Shellfish Allergy] Anaphylaxis and Itching    ALL SHELLFISH   Shellfish Allergy Itching

## 2024-08-01 NOTE — Progress Notes (Signed)
 Complex Care Management Note Care Guide Note  08/01/2024 Name: Jesse Key MRN: [id number] DOB: 20-Dec-1965   Complex Care Management Outreach Attempts: An unsuccessful telephone outreach was attempted today to offer the patient information about available complex care management services.  Follow Up Plan:  Additional outreach attempts will be made to offer the patient complex care management information and services.   Encounter Outcome:  No Answer  [facility]  [facility], [facility] Guide Direct Dial: [phone]  Fax: [phone]

## 2024-08-01 NOTE — Telephone Encounter (Addendum)
 Pt aware, agreeable, and verbalized understanding Labs ordered and scheduled,   ----- Message from Caffie Shed, PA-C sent at 07/31/2024  3:04 PM EST ----- Labs stable, though SCr trending up some since d/c. We will keep off of Entresto  for now. Recommend repeat BMP in 1 wk to ensure SCr remains stable.

## 2024-08-01 NOTE — Progress Notes (Signed)
 Complex Care Management Note  Care Guide Note 08/01/2024 Name: MOUHAMADOU GITTLEMAN MRN: [id number] DOB: 09/23/1965  NICKOLAOS BRALLIER is a 59 y.o. year old male who sees Celestia Rosaline SQUIBB, NP for primary care. I reached out to Vinie JINNY Pyo by phone today to offer complex care management services.  Mr. Ferrone was given information about Complex Care Management services today including:   The Complex Care Management services include support from the care team which includes your Nurse Care Manager, Clinical Social Worker, or Pharmacist.  The Complex Care Management team is here to help remove barriers to the health concerns and goals most important to you. Complex Care Management services are voluntary, and the patient may decline or stop services at any time by request to their care team member.   Complex Care Management Consent Status: Patient agreed to services and verbal consent obtained.   Follow up plan:  Telephone appointment with complex care management team member scheduled for: 08/26/2024  Encounter Outcome:  Patient Scheduled  Doyce Razor [facility]  [facility], [facility] Guide Direct Dial: [phone]  Fax: [phone]

## 2024-08-05 ENCOUNTER — Telehealth (HOSPITAL_COMMUNITY): Payer: Self-pay | Admitting: Licensed Clinical Social Worker

## 2024-08-07 ENCOUNTER — Ambulatory Visit (HOSPITAL_COMMUNITY)
Admission: RE | Admit: 2024-08-07 | Discharge: 2024-08-07 | Disposition: A | Source: Ambulatory Visit | Attending: Cardiology

## 2024-08-07 ENCOUNTER — Telehealth (HOSPITAL_COMMUNITY): Payer: Self-pay | Admitting: Licensed Clinical Social Worker

## 2024-08-07 ENCOUNTER — Ambulatory Visit (HOSPITAL_COMMUNITY): Payer: Self-pay | Admitting: Cardiology

## 2024-08-07 DIAGNOSIS — I5022 Chronic systolic (congestive) heart failure: Secondary | ICD-10-CM

## 2024-08-07 LAB — BASIC METABOLIC PANEL WITH GFR
Anion gap: 10 (ref 5–15)
BUN: 25 mg/dL — ABNORMAL HIGH (ref 6–20)
CO2: 28 mmol/L (ref 22–32)
Calcium: 9.5 mg/dL (ref 8.9–10.3)
Chloride: 103 mmol/L (ref 98–111)
Creatinine, Ser: 1.49 mg/dL — ABNORMAL HIGH (ref 0.61–1.24)
GFR, Estimated: 53 mL/min — ABNORMAL LOW
Glucose, Bld: 122 mg/dL — ABNORMAL HIGH (ref 70–99)
Potassium: 4.1 mmol/L (ref 3.5–5.1)
Sodium: 141 mmol/L (ref 135–145)

## 2024-08-07 NOTE — Telephone Encounter (Signed)
 H&V Care Navigation CSW Progress Note  Clinical Social Worker provided bus passes and clothing to patient to assist with transportation and financial insecurity.  Patient staying at [employer] at this time- continuing to work with [employer] on disability appeal.  Andriette HILARIO Leech, LCSW Clinical Social Worker Advanced Heart Failure Clinic Desk#: [phone] Cell#: [phone]

## 2024-08-08 ENCOUNTER — Telehealth (HOSPITAL_COMMUNITY): Payer: Self-pay | Admitting: Emergency Medicine

## 2024-08-08 NOTE — Telephone Encounter (Signed)
 Spoke with Jesse Key and scheduled visit 2/6 @ 8:30 at the [facility].    Mary Sharps, EMT-Paramedic [phone] 08/08/2024

## 2024-08-09 ENCOUNTER — Other Ambulatory Visit (HOSPITAL_COMMUNITY): Payer: Self-pay

## 2024-08-09 ENCOUNTER — Other Ambulatory Visit (HOSPITAL_COMMUNITY): Payer: Self-pay | Admitting: Emergency Medicine

## 2024-08-09 MED ORDER — EMPAGLIFLOZIN 10 MG PO TABS
10.0000 mg | ORAL_TABLET | Freq: Every day | ORAL | 0 refills | Status: AC
  Filled 2024-08-09: qty 30, 30d supply, fill #0

## 2024-08-09 MED ORDER — TORSEMIDE 20 MG PO TABS
20.0000 mg | ORAL_TABLET | Freq: Every day | ORAL | 5 refills | Status: AC
  Filled 2024-08-09: qty 30, 30d supply, fill #0

## 2024-08-09 MED ORDER — FOLIC ACID 1 MG PO TABS
1.0000 mg | ORAL_TABLET | Freq: Every day | ORAL | 5 refills | Status: AC
  Filled 2024-08-09: qty 30, 30d supply, fill #0

## 2024-08-09 MED ORDER — SPIRONOLACTONE 25 MG PO TABS
25.0000 mg | ORAL_TABLET | Freq: Every day | ORAL | 5 refills | Status: AC
  Filled 2024-08-09: qty 30, 30d supply, fill #0

## 2024-08-09 MED ORDER — CLOPIDOGREL BISULFATE 75 MG PO TABS
75.0000 mg | ORAL_TABLET | Freq: Every day | ORAL | 11 refills | Status: AC
  Filled 2024-08-09: qty 30, 30d supply, fill #0

## 2024-08-09 MED ORDER — NICOTINE 14 MG/24HR TD PT24
14.0000 mg | MEDICATED_PATCH | Freq: Every day | TRANSDERMAL | 11 refills | Status: AC
  Filled 2024-08-09: qty 28, 28d supply, fill #0

## 2024-08-09 MED ORDER — THIAMINE HCL 100 MG PO TABS
100.0000 mg | ORAL_TABLET | Freq: Every day | ORAL | 3 refills | Status: AC
  Filled 2024-08-09: qty 30, 30d supply, fill #0

## 2024-08-09 MED ORDER — ROSUVASTATIN CALCIUM 20 MG PO TABS
20.0000 mg | ORAL_TABLET | Freq: Every day | ORAL | 0 refills | Status: AC
  Filled 2024-08-09: qty 30, 30d supply, fill #0

## 2024-08-09 NOTE — Progress Notes (Signed)
 Paramedicine Encounter    Patient ID: Jesse Key, male    DOB: [birth year], 59 y.o.   MRN: [id number]   Complaints NONE  AssessmentA&O x 4, skin W&D w/ good color. Denies chest pain or SOB.  Lung sounds clear bilat and no peripheral edema. Med box reconciled x 2 weeks  Compliance with meds YES  Pill box filled x 2 weeks  Refills needed:  Jardiance  and Rosuvastatin . All TOC need called in for refills also.    Meds changes since last visit NONE    Social changes NONE   BP 100/62 (BP Location: Right Arm, Patient Position: Sitting, Cuff Size: Normal)   Pulse 97   SpO2 95%  Weight yesterday- Last visit weight-  Today's visit w/ Jesse Key.  I met him @ [employer] and we were able to meet in private in the [facility].  I printed out a med list for pt and we went through it together and he filled his own pill box while I supervised him.   He is actively looking for employment. He tells me that he had a death in the family and his aunt will be picking him up and taking him to the funeral on Saturday.   ACTION: Home visit completed  Jesse Key [phone] 08/09/24  Patient Care Team: Celestia Rosaline SQUIBB, NP as PCP - General (Internal Medicine) Bensimhon, Toribio SAUNDERS, MD as PCP - Advanced Heart Failure (Cardiology) Loni Soyla LABOR, MD as PCP - Cardiology (Cardiology) Patient, No Pcp Per (General Practice) Frances, Ozell RAMAN, LCSW as [facility] Care Management (Licensed Clinical Social Worker) Sharene, German as Social Worker  Patient Active Problem List   Diagnosis Date Noted   Elevated liver transaminase level 07/09/2024   Bleeding internal hemorrhoids 07/02/2024   Rectal bleeding 06/30/2024   Asthma, chronic 06/26/2024   AKI (acute kidney injury) 06/25/2024   Lactic acidosis 06/25/2024   CHF (congestive heart failure) (HCC) 06/24/2024   Nonischemic cardiomyopathy (HCC) 06/24/2024   Demand ischemia (HCC) 06/24/2024   Acute heart failure  with reduced ejection fraction (HFrEF) (HCC) 06/24/2024   Acute gout 04/04/2024   Chest pain 04/02/2024   Chronic kidney disease, stage 3a (HCC) 04/02/2024   Hyperkalemia 04/02/2024   CAD (coronary artery disease) 04/02/2024   Acute on chronic systolic CHF (congestive heart failure) (HCC) 04/02/2024   Shortness of breath 11/25/2023   Essential hypertension 11/24/2023   Heart failure (HCC) 11/23/2023   ETOH abuse 11/23/2023   Elevated troponin 11/23/2023   Leukocytosis 11/23/2023   Acute pulmonary edema (HCC) 11/23/2023   Current Medications[1] Allergies[2]   Social History   Socioeconomic History   Marital status: Single    Spouse name: Not on file   Number of children: Not on file   Years of education: Not on file   Highest education level: Not on file  Occupational History   Not on file  Tobacco Use   Smoking status: Some Days    Current packs/day: 0.25    Types: Cigarettes   Smokeless tobacco: Never  Vaping Use   Vaping status: Never Used  Substance and Sexual Activity   Alcohol use: Yes    Comment: weekends only   Drug use: No   Sexual activity: Not on file  Other Topics Concern   Not on file  Social History Narrative   ** Merged History Encounter **       Homeless at the time may smoke one cigarette a day   Social Drivers  of Health   Tobacco Use: High Risk (07/31/2024)   Patient History    Smoking Tobacco Use: Some Days    Smokeless Tobacco Use: Never    Passive Exposure: Not on file  Financial Resource Strain: High Risk (07/31/2024)   Overall Financial Resource Strain (CARDIA)    Difficulty of Paying Living Expenses: Very hard  Food Insecurity: No Food Insecurity (07/31/2024)   Epic    Worried About Programme Researcher, Broadcasting/film/video in the Last Year: Never true    Ran Out of Food in the Last Year: Never true  Recent Concern: Food Insecurity - Food Insecurity Present (07/09/2024)   Epic    Worried About Programme Researcher, Broadcasting/film/video in the Last Year: Often true    Ran Out of  Food in the Last Year: Often true  Transportation Needs: No Transportation Needs (07/31/2024)   Epic    Lack of Transportation (Medical): No    Lack of Transportation (Non-Medical): No  Recent Concern: Transportation Needs - Unmet Transportation Needs (07/09/2024)   Epic    Lack of Transportation (Medical): Yes    Lack of Transportation (Non-Medical): Yes  Physical Activity: Not on file  Stress: Stress Concern Present (06/04/2024)   [employer] of Occupational Health - Occupational Stress Questionnaire    Feeling of Stress: To some extent  Social Connections: Not on file  Intimate Partner Violence: Not At Risk (07/31/2024)   Epic    Fear of Current or Ex-Partner: No    Emotionally Abused: No    Physically Abused: No    Sexually Abused: No  Depression (PHQ2-9): Medium Risk (06/04/2024)   Depression (PHQ2-9)    PHQ-2 Score: 9  Alcohol Screen: Not on file  Housing: High Risk (07/31/2024)   Epic    Unable to Pay for Housing in the Last Year: Yes    Number of Times Moved in the Last Year: 4    Homeless in the Last Year: Yes  Utilities: Not At Risk (07/31/2024)   Epic    Threatened with loss of utilities: No  Recent Concern: Utilities - At Risk (07/09/2024)   Epic    Threatened with loss of utilities: Yes  Health Literacy: Adequate Health Literacy (01/03/2024)   B1300 Health Literacy    Frequency of need for help with medical instructions: Rarely    Physical Exam      Future Appointments  Date Time Provider Department Center  08/14/2024 11:00 AM Munoz-Lopez, [place] [facility] None  08/14/2024  3:00 PM Inocencio Soyla Lunger, MD [facility] H&V  08/15/2024  2:30 PM Celestia Rosaline SQUIBB, NP [facility] [place] [facility]  08/19/2024  2:00 PM Bensimhon, Toribio SAUNDERS, MD [facility] None  08/21/2024  2:00 PM McLaurin, Nichole LABOR, RN [facility] None  08/26/2024  1:30 PM Lewis, Jasmine D, LCSW [facility] None           [1]  Current Outpatient Medications:    albuterol  (VENTOLIN  HFA) 108 (90 Base) MCG/ACT  inhaler, Inhale 1-2 puffs into the lungs every 6 (six) hours as needed for wheezing or shortness of breath., Disp: 6.7 g, Rfl: 0   aspirin  EC 81 MG tablet, Take 1 tablet (81 mg total) by mouth daily. Swallow whole., Disp: 30 tablet, Rfl: 12   clopidogrel  (PLAVIX ) 75 MG tablet, Take 1 tablet (75 mg total) by mouth daily with breakfast., Disp: 30 tablet, Rfl: 11   Digoxin  62.5 MCG TABS, Take 62.5 mcg (1 tablet) by mouth daily., Disp: 30 tablet, Rfl: 5   empagliflozin  (JARDIANCE ) 10 MG  TABS tablet, Take 1 tablet (10 mg total) by mouth daily before breakfast., Disp: 30 tablet, Rfl: 0   folic acid  (FOLVITE ) 1 MG tablet, Take 1 tablet (1 mg total) by mouth daily., Disp: 30 tablet, Rfl: 5   Multiple Vitamin (MULTIVITAMIN WITH MINERALS) TABS tablet, Take 1 tablet by mouth daily., Disp: 30 tablet, Rfl: 5   rosuvastatin  (CRESTOR ) 20 MG tablet, Take 1 tablet (20 mg total) by mouth daily., Disp: 30 tablet, Rfl: 0   spironolactone  (ALDACTONE ) 25 MG tablet, Take 1 tablet (25 mg total) by mouth daily., Disp: 30 tablet, Rfl: 5   thiamine  (VITAMIN B1) 100 MG tablet, Take 1 tablet (100 mg total) by mouth daily., Disp: 30 tablet, Rfl: 3   torsemide  (DEMADEX ) 20 MG tablet, Take 1 tablet (20 mg total) by mouth daily., Disp: 30 tablet, Rfl: 5   nicotine  (NICODERM CQ  - DOSED IN MG/24 HOURS) 14 mg/24hr patch, Place 1 patch (14 mg total) onto the skin daily. (Patient not taking: Reported on 08/09/2024), Disp: , Rfl:  [2]  Allergies Allergen Reactions   Shrimp [Shellfish Allergy] Anaphylaxis and Itching    ALL SHELLFISH   Shellfish Allergy Itching

## 2024-08-14 ENCOUNTER — Telehealth

## 2024-08-14 ENCOUNTER — Ambulatory Visit: Admitting: Cardiology

## 2024-08-15 ENCOUNTER — Inpatient Hospital Stay (INDEPENDENT_AMBULATORY_CARE_PROVIDER_SITE_OTHER): Payer: Self-pay | Admitting: Primary Care

## 2024-08-19 ENCOUNTER — Other Ambulatory Visit (HOSPITAL_COMMUNITY)

## 2024-08-19 ENCOUNTER — Ambulatory Visit (HOSPITAL_COMMUNITY): Admitting: Internal Medicine

## 2024-08-21 ENCOUNTER — Telehealth: Admitting: *Deleted

## 2024-08-26 ENCOUNTER — Telehealth: Admitting: Licensed Clinical Social Worker
